# Patient Record
Sex: Male | Born: 1955
Health system: Southern US, Community
[De-identification: ages and names within clinical notes are randomized; demographics above are authoritative.]

## PROBLEM LIST (undated history)

## (undated) DIAGNOSIS — F329 Major depressive disorder, single episode, unspecified: Secondary | ICD-10-CM

## (undated) DIAGNOSIS — E119 Type 2 diabetes mellitus without complications: Secondary | ICD-10-CM

## (undated) DIAGNOSIS — N138 Other obstructive and reflux uropathy: Secondary | ICD-10-CM

## (undated) DIAGNOSIS — N481 Balanitis: Secondary | ICD-10-CM

## (undated) DIAGNOSIS — F32A Depression, unspecified: Secondary | ICD-10-CM

## (undated) DIAGNOSIS — B182 Chronic viral hepatitis C: Secondary | ICD-10-CM

## (undated) DIAGNOSIS — I10 Essential (primary) hypertension: Secondary | ICD-10-CM

## (undated) DIAGNOSIS — Z87898 Personal history of other specified conditions: Secondary | ICD-10-CM

## (undated) DIAGNOSIS — E785 Hyperlipidemia, unspecified: Secondary | ICD-10-CM

## (undated) DIAGNOSIS — N401 Enlarged prostate with lower urinary tract symptoms: Secondary | ICD-10-CM

## (undated) DIAGNOSIS — M1990 Unspecified osteoarthritis, unspecified site: Secondary | ICD-10-CM

## (undated) DIAGNOSIS — F1491 Cocaine use, unspecified, in remission: Secondary | ICD-10-CM

## (undated) DIAGNOSIS — E66812 Obesity, class 2: Secondary | ICD-10-CM

## (undated) DIAGNOSIS — G061 Intraspinal abscess and granuloma: Secondary | ICD-10-CM

## (undated) DIAGNOSIS — T847XXA Infection and inflammatory reaction due to other internal orthopedic prosthetic devices, implants and grafts, initial encounter: Secondary | ICD-10-CM

## (undated) DIAGNOSIS — Z720 Tobacco use: Secondary | ICD-10-CM

## (undated) DIAGNOSIS — R339 Retention of urine, unspecified: Secondary | ICD-10-CM

## (undated) DIAGNOSIS — Z8669 Personal history of other diseases of the nervous system and sense organs: Secondary | ICD-10-CM

## (undated) DIAGNOSIS — J390 Retropharyngeal and parapharyngeal abscess: Secondary | ICD-10-CM

## (undated) DIAGNOSIS — Z8673 Personal history of transient ischemic attack (TIA), and cerebral infarction without residual deficits: Secondary | ICD-10-CM

## (undated) DIAGNOSIS — E669 Obesity, unspecified: Secondary | ICD-10-CM

## (undated) HISTORY — DX: Tobacco use: Z72.0

## (undated) HISTORY — DX: Personal history of transient ischemic attack (TIA), and cerebral infarction without residual deficits: Z86.73

## (undated) HISTORY — DX: Benign prostatic hyperplasia with lower urinary tract symptoms: N40.1

## (undated) HISTORY — DX: Intraspinal abscess and granuloma: G06.1

## (undated) HISTORY — DX: Obesity, class 2: E66.812

## (undated) HISTORY — DX: Chronic viral hepatitis C: B18.2

## (undated) HISTORY — DX: Personal history of other specified conditions: Z87.898

## (undated) HISTORY — DX: Depression, unspecified: F32.A

## (undated) HISTORY — DX: Retropharyngeal and parapharyngeal abscess: J39.0

## (undated) HISTORY — DX: Unspecified osteoarthritis, unspecified site: M19.90

## (undated) HISTORY — DX: Infection and inflammatory reaction due to other internal orthopedic prosthetic devices, implants and grafts, initial encounter: T84.7XXA

## (undated) HISTORY — DX: Retention of urine, unspecified: R33.9

## (undated) HISTORY — DX: Cocaine use, unspecified, in remission: F14.91

## (undated) HISTORY — DX: Obesity, unspecified: E66.9

## (undated) HISTORY — DX: Major depressive disorder, single episode, unspecified: F32.9

## (undated) HISTORY — DX: Benign prostatic hyperplasia with lower urinary tract symptoms: N13.8

## (undated) HISTORY — PX: OTHER SURGICAL HISTORY: SHX169

## (undated) HISTORY — DX: Hyperlipidemia, unspecified: E78.5

## (undated) HISTORY — DX: Personal history of other diseases of the nervous system and sense organs: Z86.69

## (undated) HISTORY — DX: Balanitis: N48.1

---

## 2013-10-25 ENCOUNTER — Emergency Department (INDEPENDENT_AMBULATORY_CARE_PROVIDER_SITE_OTHER)
Admission: EM | Admit: 2013-10-25 | Discharge: 2013-10-25 | Disposition: A | Discharge status: Home / Self Care | Payer: Medicaid - Out of State | Source: Home / Self Care | Attending: Family Medicine | Admitting: Family Medicine

## 2013-10-25 ENCOUNTER — Encounter (HOSPITAL_COMMUNITY): Payer: Self-pay | Admitting: Emergency Medicine

## 2013-10-25 DIAGNOSIS — E119 Type 2 diabetes mellitus without complications: Secondary | ICD-10-CM

## 2013-10-25 DIAGNOSIS — E1165 Type 2 diabetes mellitus with hyperglycemia: Secondary | ICD-10-CM

## 2013-10-25 HISTORY — DX: Essential (primary) hypertension: I10

## 2013-10-25 HISTORY — DX: Type 2 diabetes mellitus without complications: E11.9

## 2013-10-25 LAB — GLUCOSE, CAPILLARY: GLUCOSE-CAPILLARY: 350 mg/dL — AB (ref 70–99)

## 2013-10-25 MED ORDER — INSULIN LISPRO 100 UNIT/ML (KWIKPEN): PEN_INJECTOR | SUBCUTANEOUS | Status: DC

## 2013-10-25 MED ORDER — INSULIN GLARGINE 100 UNIT/ML SOLOSTAR PEN: 50.0000 [IU] | PEN_INJECTOR | Freq: Every day | SUBCUTANEOUS | Status: DC

## 2013-10-25 NOTE — Discharge Instructions (Signed)
Correction Insulin Your health care provider has decided you need to take insulin regularly. You have been given a correction scale (also called a sliding scale) in case you need extra insulin when your blood sugar is too high (hyperglycemia). The following instructions will assist you in how to use that correction scale.  WHAT IS A CORRECTION SCALE?  When you check your blood sugar, sometimes it will be higher than your health care provider has told you it should be. You may need an extra dose of insulin to bring your blood sugar to the recommended level (also known as your goal, target, or normal level). The correction scale is prescribed by your health care provider based on your specific needs.  Your correction scale has two parts:   The first shows you a blood sugar range.   The second part tells you how much extra insulin to give yourself if your blood sugar falls within this range. You will not need an extra dose of insulin if your blood glucose is in the desired range. You should simply give yourself the normal amount of insulin that your health care provider has ordered for you.  WHY IS IT IMPORTANT TO KEEP YOUR BLOOD SUGAR LEVELS AT YOUR DESIRED LEVEL?  Keeping your blood sugar at the desired level helps to prevent long-term complications of diabetes, such as eye disease, kidney failure, nerve damage, and other serious complications. WHAT TYPE OF INSULIN WILL YOU USE?  To help bring down blood sugar levels that are too high, your health care provider will prescribe a short-acting or a rapid-acting insulin. An example of a short-acting insulin would be regular insulin. Remember, you may also have a longer-acting insulin prescribed for you.  WHAT DO YOU NEED TO DO?   Check your blood sugar with your home blood glucose meter as recommended by your health care provider.   Using your correction scale, find the range that your blood sugar lies in.   Look for the units of insulin  that match that blood sugar range. Give yourself the dose of correction insulin your health care provider has prescribed. Always make sure you are using the right type of insulin.   Prior to the injection, make sure you have food available that you can eat in the next 15 30 minutes.   If your correction insulin is rapid acting, start eating your meal within 15 minutes after you have given yourself the insulin injection. If you wait longer than 15 minutes to eat, your blood sugar might get too low.   If your correction insulin is short acting(regular), start eating your meal within 30 minutes after you have given yourself the insulin injection. If you wait longer than 30 minutes to eat, your blood sugar might get too low. Symptoms of low blood sugar (hypoglycemia) may include feeling shaky or weak, sweating, feeling confused, difficulty seeing, agitation, crankiness, or numbness of the lips or tongue. Check your blood sugar immediately and treat your results as directed by your health care provider.   Keep a log of your blood sugar results with the time you took the test and the amount of insulin that you injected. This information will help your health care provider manage your medicines.   Note on your log anything that may affect your blood sugar level, such as:   Changes in normal exercise or activity.   Changes in your normal schedule, such as staying up late, going on vacation, changing your diet, or holidays.  New medicines. This includes prescription and over-the-counter medicines. Some medicines may cause high blood sugar.   Sickness, stress, or anxiety.   Changes in the time you took your medicine.   Changes in your meals, such as skipping a meal, having a late meal, or dining out.   Eating things that may affect blood glucose, such as snacks, meal portions that are larger than normal, drinks with sugar, or eating less than usual.   Ask your health care provider any  questions you have.  Be aware of "stacking" your insulin doses. This happens when you correct a high blood sugar level by giving yourself extra insulin too soon after a previous correction dose or mealtime dose. You may then have too much insulin still active in your body and may be at risk for hypoglycemia. WHY DO YOU NEED A CORRECTION SCALE IF YOU HAVE NEVER BEEN DIAGNOSED WITH DIABETES?   Keeping your blood glucose in the target range is important for your overall health.   You may have been prescribed medicines that cause your blood glucose to be higher than normal. WHEN SHOULD YOU SEEK MEDICAL CARE? Contact your health care provider if:   You have experienced hypoglycemia that you are unable to treat with your usual routine.   You have a high blood sugar level that is not coming down with the correction dose.  Your blood sugar is often too low or does not come up even if you eat a fast-acting carbohydrate. Someone who lives with you should seek immediate medical care if you become unresponsive. Document Released: 02/13/2011 Document Revised: 05/25/2013 Document Reviewed: 03/04/2013 [facility] Patient Information 2014 [employer].  Hyperglycemia Hyperglycemia occurs when the glucose (sugar) in your blood is too high. Hyperglycemia can happen for many reasons, but it most often happens to people who do not know they have diabetes or are not managing their diabetes properly.  CAUSES  Whether you have diabetes or not, there are other causes of hyperglycemia. Hyperglycemia can occur when you have diabetes, but it can also occur in other situations that you might not be as aware of, such as: Diabetes  If you have diabetes and are having problems controlling your blood glucose, hyperglycemia could occur because of some of the following reasons:  Not following your meal plan.  Not taking your diabetes medications or not taking it properly.  Exercising less or doing less activity  than you normally do.  Being sick. Pre-diabetes  This cannot be ignored. Before people develop Type 2 diabetes, they almost always have "pre-diabetes." This is when your blood glucose levels are higher than normal, but not yet high enough to be diagnosed as diabetes. Research has shown that some long-term damage to the body, especially the heart and circulatory system, may already be occurring during pre-diabetes. If you take action to manage your blood glucose when you have pre-diabetes, you may delay or prevent Type 2 diabetes from developing. Stress  If you have diabetes, you may be "diet" controlled or on oral medications or insulin to control your diabetes. However, you may find that your blood glucose is higher than usual in the hospital whether you have diabetes or not. This is often referred to as "stress hyperglycemia." Stress can elevate your blood glucose. This happens because of hormones put out by the body during times of stress. If stress has been the cause of your high blood glucose, it can be followed regularly by your caregiver. That way he/she can make sure your  hyperglycemia does not continue to get worse or progress to diabetes. Steroids  Steroids are medications that act on the infection fighting system (immune system) to block inflammation or infection. One side effect can be a rise in blood glucose. Most people can produce enough extra insulin to allow for this rise, but for those who cannot, steroids make blood glucose levels go even higher. It is not unusual for steroid treatments to "uncover" diabetes that is developing. It is not always possible to determine if the hyperglycemia will go away after the steroids are stopped. A special blood test called an A1c is sometimes done to determine if your blood glucose was elevated before the steroids were started. SYMPTOMS  Thirsty.  Frequent urination.  Dry mouth.  Blurred vision.  Tired or  fatigue.  Weakness.  Sleepy.  Tingling in feet or leg. DIAGNOSIS  Diagnosis is made by monitoring blood glucose in one or all of the following ways:  A1c test. This is a chemical found in your blood.  Fingerstick blood glucose monitoring.  Laboratory results. TREATMENT  First, knowing the cause of the hyperglycemia is important before the hyperglycemia can be treated. Treatment may include, but is not be limited to:  Education.  Change or adjustment in medications.  Change or adjustment in meal plan.  Treatment for an illness, infection, etc.  More frequent blood glucose monitoring.  Change in exercise plan.  Decreasing or stopping steroids.  Lifestyle changes. HOME CARE INSTRUCTIONS   Test your blood glucose as directed.  Exercise regularly. Your caregiver will give you instructions about exercise. Pre-diabetes or diabetes which comes on with stress is helped by exercising.  Eat wholesome, balanced meals. Eat often and at regular, fixed times. Your caregiver or nutritionist will give you a meal plan to guide your sugar intake.  Being at an ideal weight is important. If needed, losing as little as 10 to 15 pounds may help improve blood glucose levels. SEEK MEDICAL CARE IF:   You have questions about medicine, activity, or diet.  You continue to have symptoms (problems such as increased thirst, urination, or weight gain). SEEK IMMEDIATE MEDICAL CARE IF:   You are vomiting or have diarrhea.  Your breath smells fruity.  You are breathing faster or slower.  You are very sleepy or incoherent.  You have numbness, tingling, or pain in your feet or hands.  You have chest pain.  Your symptoms get worse even though you have been following your caregiver's orders.  If you have any other questions or concerns. Document Released: 03/18/2001 Document Revised: 12/15/2011 Document Reviewed: 01/19/2012 [facility] Patient Information 2014 [place].

## 2013-10-25 NOTE — ED Notes (Signed)
Pt reports being out of DM medication x 2 wks.  [place] moved [place] from [place]  In process of getting doctors.   States Blood sugar last checked at lunch time and was at 480.

## 2013-10-25 NOTE — ED Provider Notes (Cosign Needed)
CSN: [id number]     Arrival date & time 10/25/13  1539 History   First MD Initiated Contact with Patient 10/25/13 1801     Chief Complaint  Patient presents with  . Medication Refill   (Consider location/radiation/quality/duration/timing/severity/associated sxs/prior Treatment) HPI Comments: 58 year old male with a history of type 2 diabetes mellitus [place]. Since his mood is benign it is insulin 4 2-1/2-3 weeks. He knows his blood sugars are elevated as noted above. His review of systems essentially negative with the exception of urinary frequency. He has a history of hypertension.   Past Medical History  Diagnosis Date  . Hypertension   . Diabetes mellitus without complication    History reviewed. No pertinent past surgical history. No family history on file. History  Substance Use Topics  . Smoking status: Current Every Day Smoker  . Smokeless tobacco: Not on file  . Alcohol Use: No    Review of Systems  Constitutional: Negative.   Respiratory: Negative.   Cardiovascular: Negative.   Gastrointestinal: Negative.   Genitourinary: Positive for frequency.  Musculoskeletal: Negative for arthralgias, gait problem and neck pain.  Skin: Negative.   Psychiatric/Behavioral: Negative.     Allergies  Review of patient's allergies indicates no known allergies.  Home Medications   Current Outpatient Rx  Name  Route  Sig  Dispense  Refill  . amLODipine (NORVASC) 5 MG tablet   Oral   Take 5 mg by mouth daily.         . insulin aspart (NOVOLOG) 100 UNIT/ML injection   Subcutaneous   Inject into the skin 3 (three) times daily before meals.         . insulin glargine (LANTUS) 100 UNIT/ML injection   Subcutaneous   Inject into the skin at bedtime.         . irbesartan (AVAPRO) 75 MG tablet   Oral   Take 75 mg by mouth daily.         . metFORMIN (GLUCOPHAGE) 500 MG tablet   Oral   Take by mouth 2 (two) times daily with a meal.          . Insulin Glargine (LANTUS SOLOSTAR) 100 UNIT/ML Solostar Pen   Subcutaneous   Inject 50 Units into the skin at bedtime.   15 mL   1   . insulin lispro (HUMALOG) 100 UNIT/ML KiwkPen      Inject 5 units into skin 5 minutes before meals TID   15 mL   11    BP 129/89  Pulse 96  Temp(Src) 98.2 F (36.8 C) (Oral)  Resp 16  SpO2 99% Physical Exam  Nursing note and vitals reviewed. Constitutional: He is oriented to person, place, and time. He appears well-developed and well-nourished. No distress.  HENT:  Mouth/Throat: Oropharynx is clear and moist.  Eyes: Conjunctivae and EOM are normal.  Neck: Normal range of motion. Neck supple.  Cardiovascular: Normal rate, regular rhythm and normal heart sounds.   Pulmonary/Chest: Effort normal and breath sounds normal. No respiratory distress. He has no wheezes.  Abdominal: Soft. There is no tenderness.  Musculoskeletal: He exhibits no edema and no tenderness.  Neurological: He is alert and oriented to person, place, and time. He exhibits normal muscle tone.  Skin: Skin is warm and dry. No rash noted.  Psychiatric: He has a normal mood and affect.    ED Course  Procedures (including critical care time) Labs Review Labs Reviewed  GLUCOSE, CAPILLARY - Abnormal; Notable  for the following:    Glucose-Capillary 350 (*)    All other components within normal limits   Imaging Review No results found.    MDM   1. Type 2 diabetes mellitus with hyperglycemia     Refill Humalog Kwik Pen 100 units per ml 5 units a.c. 3 times a day  Refill Lantus Solostar 50 units subcutaneous at bedtime  Recommend adding 10 units to the regular Lantus insulin dose tonight and adding an extra 5 units of hematologic prior to your evening meal tonight only.    Hayden Rasmussen, NP 10/25/13 [phone]

## 2013-12-02 ENCOUNTER — Ambulatory Visit: Payer: Medicaid Other | Attending: Internal Medicine | Admitting: Internal Medicine

## 2013-12-02 ENCOUNTER — Encounter: Payer: Self-pay | Admitting: Internal Medicine

## 2013-12-02 VITALS — BP 148/115 | HR 76 | Temp 98.8°F | Resp 16 | Wt 221.0 lb

## 2013-12-02 DIAGNOSIS — I10 Essential (primary) hypertension: Secondary | ICD-10-CM | POA: Insufficient documentation

## 2013-12-02 DIAGNOSIS — E1151 Type 2 diabetes mellitus with diabetic peripheral angiopathy without gangrene: Secondary | ICD-10-CM | POA: Insufficient documentation

## 2013-12-02 DIAGNOSIS — R21 Rash and other nonspecific skin eruption: Secondary | ICD-10-CM | POA: Insufficient documentation

## 2013-12-02 DIAGNOSIS — M25562 Pain in left knee: Secondary | ICD-10-CM

## 2013-12-02 DIAGNOSIS — Z794 Long term (current) use of insulin: Secondary | ICD-10-CM | POA: Insufficient documentation

## 2013-12-02 DIAGNOSIS — E1165 Type 2 diabetes mellitus with hyperglycemia: Secondary | ICD-10-CM

## 2013-12-02 DIAGNOSIS — F17200 Nicotine dependence, unspecified, uncomplicated: Secondary | ICD-10-CM | POA: Insufficient documentation

## 2013-12-02 DIAGNOSIS — M25569 Pain in unspecified knee: Secondary | ICD-10-CM | POA: Insufficient documentation

## 2013-12-02 DIAGNOSIS — IMO0002 Reserved for concepts with insufficient information to code with codable children: Secondary | ICD-10-CM | POA: Insufficient documentation

## 2013-12-02 DIAGNOSIS — E119 Type 2 diabetes mellitus without complications: Secondary | ICD-10-CM | POA: Insufficient documentation

## 2013-12-02 DIAGNOSIS — Z139 Encounter for screening, unspecified: Secondary | ICD-10-CM

## 2013-12-02 LAB — COMPLETE METABOLIC PANEL WITH GFR
ALBUMIN: 4.9 g/dL (ref 3.5–5.2)
ALK PHOS: 66 U/L (ref 39–117)
ALT: 89 U/L — ABNORMAL HIGH (ref 0–53)
AST: 48 U/L — AB (ref 0–37)
BUN: 12 mg/dL (ref 6–23)
CALCIUM: 9.8 mg/dL (ref 8.4–10.5)
CHLORIDE: 101 meq/L (ref 96–112)
CO2: 27 mEq/L (ref 19–32)
Creat: 0.95 mg/dL (ref 0.50–1.35)
GFR, Est African American: >89 mL/min
GFR, Est Non African American: 88 mL/min
GLUCOSE: 139 mg/dL — AB (ref 70–99)
POTASSIUM: 4 meq/L (ref 3.5–5.3)
SODIUM: 136 meq/L (ref 135–145)
TOTAL PROTEIN: 8.2 g/dL (ref 6.0–8.3)
Total Bilirubin: 0.6 mg/dL (ref 0.2–1.2)

## 2013-12-02 LAB — LIPID PANEL
CHOL/HDL RATIO: 6 ratio
Cholesterol: 211 mg/dL — ABNORMAL HIGH (ref 0–200)
HDL: 35 mg/dL — ABNORMAL LOW (ref >39)
LDL Cholesterol: 156 mg/dL — ABNORMAL HIGH (ref 0–99)
Triglycerides: 98 mg/dL (ref <150)
VLDL: 20 mg/dL (ref 0–40)

## 2013-12-02 LAB — CBC WITH DIFFERENTIAL/PLATELET
BASOS PCT: 0 % (ref 0–1)
Basophils Absolute: 0 10*3/uL (ref 0.0–0.1)
Eosinophils Absolute: 0 10*3/uL (ref 0.0–0.7)
Eosinophils Relative: 0 % (ref 0–5)
HEMATOCRIT: 45.7 % (ref 39.0–52.0)
Hemoglobin: 15.9 g/dL (ref 13.0–17.0)
Lymphocytes Relative: 51 % — ABNORMAL HIGH (ref 12–46)
Lymphs Abs: 2.6 10*3/uL (ref 0.7–4.0)
MCH: 30.4 pg (ref 26.0–34.0)
MCHC: 34.8 g/dL (ref 30.0–36.0)
MCV: 87.4 fL (ref 78.0–100.0)
Monocytes Absolute: 0.4 10*3/uL (ref 0.1–1.0)
Monocytes Relative: 7 % (ref 3–12)
NEUTROS ABS: 2.1 10*3/uL (ref 1.7–7.7)
NEUTROS PCT: 42 % — AB (ref 43–77)
Platelets: 214 10*3/uL (ref 150–400)
RBC: 5.23 MIL/uL (ref 4.22–5.81)
RDW: 15.4 % (ref 11.5–15.5)
WBC: 5 10*3/uL (ref 4.0–10.5)

## 2013-12-02 LAB — POCT GLYCOSYLATED HEMOGLOBIN (HGB A1C): HEMOGLOBIN A1C: 8.4

## 2013-12-02 LAB — TSH: TSH: 2.003 u[IU]/mL (ref 0.350–4.500)

## 2013-12-02 MED ORDER — HYDROCORTISONE 2.5 % EX CREA: TOPICAL_CREAM | Freq: Two times a day (BID) | CUTANEOUS | Status: DC

## 2013-12-02 MED ORDER — AMLODIPINE BESYLATE 10 MG PO TABS: 10.0000 mg | ORAL_TABLET | Freq: Every day | ORAL | Status: DC

## 2013-12-02 MED ORDER — GLUCOSE BLOOD VI STRP: ORAL_STRIP | Status: DC

## 2013-12-02 MED ORDER — FREESTYLE LANCETS MISC: Status: DC

## 2013-12-02 MED ORDER — INSULIN NPH (HUMAN) (ISOPHANE) 100 UNIT/ML ~~LOC~~ SUSP: 25.0000 [IU] | Freq: Two times a day (BID) | SUBCUTANEOUS | Status: DC

## 2013-12-02 NOTE — Progress Notes (Signed)
Patient here to establish care History of DM Blood sugar this am 110

## 2013-12-02 NOTE — Patient Instructions (Signed)

## 2013-12-02 NOTE — Progress Notes (Signed)
Patient Demographics  William [name], is a 58 y.o. male  [id number]  [id number]  DOB - 09-05-1956  CC:  Chief Complaint  Patient presents with  . Establish Care       HPI: William [name] is a 58 y.o. male here today to establish medical care. Patient has history of diabetes hypertension, as per patient in the past he was taking Lantus and NovoLog but since January he has been on Humulin 25 units twice a day patient denies any hypoglycemic symptoms, his fasting sugar is usually around her 100 mg/dL, for hypertension he has been taking irbesartan as well as Norvasc 5 mg today's blood pressure is elevated, patient denies any headache dizziness. Patient reported to have rash in his groin area. Patient also complained of left knee pain for several months? He was told that he has severe osteoarthritis and needs a knee replacement, has not seen orthopedics yet. Patient has No headache, No chest pain, No abdominal pain - No Nausea, No new weakness tingling or numbness, No Cough - SOB.  No Known Allergies Past Medical History  Diagnosis Date  . Hypertension   . Diabetes mellitus without complication    Current Outpatient Prescriptions on File Prior to Visit  Medication Sig Dispense Refill  . insulin aspart (NOVOLOG) 100 UNIT/ML injection Inject into the skin 3 (three) times daily before meals.      . Insulin Glargine (LANTUS SOLOSTAR) 100 UNIT/ML Solostar Pen Inject 50 Units into the skin at bedtime.  15 mL  1  . insulin glargine (LANTUS) 100 UNIT/ML injection Inject into the skin at bedtime.      . insulin lispro (HUMALOG) 100 UNIT/ML KiwkPen Inject 5 units into skin 5 minutes before meals TID  15 mL  11  . irbesartan (AVAPRO) 75 MG tablet Take 75 mg by mouth daily.      . metFORMIN (GLUCOPHAGE) 500 MG tablet Take by mouth 2 (two) times daily with a meal.       No current facility-administered medications on file prior to visit.   Family History  Problem Relation Age of Onset    . Hypertension Mother   . Diabetes Mother   . Heart disease Mother   . Hypertension Father   . Diabetes Father   . Heart disease Father    History   Social History  . Marital Status: Married    Spouse Name: N/A    Number of Children: N/A  . Years of Education: N/A   Occupational History  . Not on file.   Social History Main Topics  . Smoking status: Current Every Day Smoker  . Smokeless tobacco: Not on file  . Alcohol Use: No  . Drug Use: No  . Sexual Activity: Yes    Birth Control/ Protection: Condom   Other Topics Concern  . Not on file   Social History Narrative  . No narrative on file    Review of Systems: Constitutional: Negative for fever, chills, diaphoresis, activity change, appetite change and fatigue. HENT: Negative for ear pain, nosebleeds, congestion, facial swelling, rhinorrhea, neck pain, neck stiffness and ear discharge.  Eyes: Negative for pain, discharge, redness, itching and visual disturbance. Respiratory: Negative for cough, choking, chest tightness, shortness of breath, wheezing and stridor.  Cardiovascular: Negative for chest pain, palpitations and leg swelling. Gastrointestinal: Negative for abdominal distention. Genitourinary: Negative for dysuria, urgency, frequency, hematuria, flank pain, decreased urine volume, difficulty urinating and dyspareunia.  Musculoskeletal: Negative for back pain, joint swelling, +arthralgia  and gait problem. Neurological: Negative for dizziness, tremors, seizures, syncope, facial asymmetry, speech difficulty, weakness, light-headedness, numbness and headaches.  Hematological: Negative for adenopathy. Does not bruise/bleed easily. Psychiatric/Behavioral: Negative for hallucinations, behavioral problems, confusion, dysphoric mood, decreased concentration and agitation.    Objective:   Filed Vitals:   12/02/13 1426  BP: 148/115  Pulse: 76  Temp: 98.8 F (37.1 C)  Resp: 16    Physical Exam: Constitutional:  Patient appears well-developed and well-nourished. No distress. HENT: Normocephalic, atraumatic, External right and left ear normal. Oropharynx is clear and moist.  Eyes: Conjunctivae and EOM are normal. PERRLA, no scleral icterus. Neck: Normal ROM. Neck supple. No JVD. No tracheal deviation. No thyromegaly. CVS: RRR, S1/S2 +, no murmurs, no gallops, no carotid bruit.  Pulmonary: Effort and breath sounds normal, no stridor, rhonchi, wheezes, rales.  Abdominal: Soft. BS +, no distension, tenderness, rebound or guarding.  Musculoskeletal: Normal range of motion. Some tenderness in the left knee crepitation positive.  Neuro: Alert. Normal reflexes, muscle tone coordination. No cranial nerve deficit. Skin: Skin is warm and dry. No rash noted. Not diaphoretic. No erythema. No pallor. Psychiatric: Normal mood and affect. Behavior, judgment, thought content normal.  No results found for this basename: WBC, HGB, HCT, MCV, PLT   No results found for this basename: CREATININE, BUN, NA, K, CL, CO2    Lab Results  Component Value Date   HGBA1C 8.4 12/02/2013   Lipid Panel  No results found for this basename: chol, trig, hdl, cholhdl, vldl, ldlcalc       Assessment and plan:   1. DM (diabetes mellitus)  - HgB A1c - insulin NPH Human (HUMULIN N) 100 UNIT/ML injection; Inject 25 Units into the skin 2 (two) times daily before a meal.  Dispense: 10 mL; Refill: 11 - glucose blood test strip; Use as instructed  Dispense: 100 each; Refill: 12 Advised patient for diabetes meal planning   2. Essential hypertension, benign Advised patient follow salt diet I have increased the dose of Norvasc to 10 mg daily continue with irbesartan. - amLODipine (NORVASC) 10 MG tablet; Take 1 tablet (10 mg total) by mouth daily.  Dispense: 30 tablet; Refill: 4  3. Smoking Patient is going to try to quit smoking.   4. Left knee pain  - Ambulatory referral to Orthopedic Surgery  5. Rash and nonspecific skin  eruption  - hydrocortisone 2.5 % cream; Apply topically 2 (two) times daily.  Dispense: 30 g; Refill: 0  6. Screening We'll do baseline blood work  - CBC with Differential - COMPLETE METABOLIC PANEL WITH GFR - TSH - Lipid panel - Vit D  25 hydroxy (rtn osteoporosis monitoring)         Return in about 6 weeks (around 01/13/2014) for diabetes, hypertension.    Doris CheadleADVANI, Lakishia Bourassa, MD

## 2013-12-03 LAB — VITAMIN D 25 HYDROXY (VIT D DEFICIENCY, FRACTURES): VIT D 25 HYDROXY: 17 ng/mL — AB (ref 30–89)

## 2013-12-05 ENCOUNTER — Telehealth: Payer: Self-pay

## 2013-12-05 NOTE — Telephone Encounter (Signed)
Phone number listed is not a working number.

## 2014-01-14 ENCOUNTER — Emergency Department: Payer: Self-pay | Admitting: Emergency Medicine

## 2014-01-14 LAB — CBC
HCT: 49.2 % (ref 40.0–52.0)
HGB: 15.7 g/dL (ref 13.0–18.0)
MCH: 29.1 pg (ref 26.0–34.0)
MCHC: 32 g/dL (ref 32.0–36.0)
MCV: 91 fL (ref 80–100)
PLATELETS: 154 10*3/uL (ref 150–440)
RBC: 5.41 10*6/uL (ref 4.40–5.90)
RDW: 14.1 % (ref 11.5–14.5)
WBC: 5.8 10*3/uL (ref 3.8–10.6)

## 2014-01-14 LAB — URINALYSIS, COMPLETE
BACTERIA: NONE SEEN
BILIRUBIN, UR: NEGATIVE
Blood: NEGATIVE
Glucose,UR: >=500 mg/dL (ref 0–75)
KETONE: NEGATIVE
LEUKOCYTE ESTERASE: NEGATIVE
Nitrite: NEGATIVE
PH: 5 (ref 4.5–8.0)
Protein: NEGATIVE
RBC,UR: <1 /HPF (ref 0–5)
SQUAMOUS EPITHELIAL: NONE SEEN
Specific Gravity: 1.03 (ref 1.003–1.030)

## 2014-01-14 LAB — COMPREHENSIVE METABOLIC PANEL
Albumin: 4.1 g/dL (ref 3.4–5.0)
Alkaline Phosphatase: 137 U/L — ABNORMAL HIGH
Anion Gap: 9 (ref 7–16)
BILIRUBIN TOTAL: 0.4 mg/dL (ref 0.2–1.0)
BUN: 10 mg/dL (ref 7–18)
CHLORIDE: 96 mmol/L — AB (ref 98–107)
CREATININE: 1.07 mg/dL (ref 0.60–1.30)
Calcium, Total: 9.5 mg/dL (ref 8.5–10.1)
Co2: 24 mmol/L (ref 21–32)
EGFR (Non-African Amer.): >60
Glucose: 469 mg/dL — ABNORMAL HIGH (ref 65–99)
Osmolality: 279 (ref 275–301)
POTASSIUM: 4.1 mmol/L (ref 3.5–5.1)
SGOT(AST): 168 U/L — ABNORMAL HIGH (ref 15–37)
SGPT (ALT): 437 U/L — ABNORMAL HIGH (ref 12–78)
Sodium: 129 mmol/L — ABNORMAL LOW (ref 136–145)
TOTAL PROTEIN: 8.7 g/dL — AB (ref 6.4–8.2)

## 2014-02-15 ENCOUNTER — Telehealth: Payer: Self-pay | Admitting: Internal Medicine

## 2014-02-15 NOTE — Telephone Encounter (Signed)
I spoke to [name]. [name] is changing providers and she wanted know some of the lab results. I informed her of the doctors notes and some results. I had her send over a release papers so she could have his reports.

## 2014-02-15 NOTE — Telephone Encounter (Signed)
Durward MallardCamille is calling today to request the lab results for Mr. Granberg done on 2/27; [facility] [phone]

## 2014-02-28 ENCOUNTER — Emergency Department: Payer: Self-pay | Admitting: Emergency Medicine

## 2014-02-28 LAB — CBC
HCT: 48.3 % (ref 40.0–52.0)
HGB: 16.2 g/dL (ref 13.0–18.0)
MCH: 30.5 pg (ref 26.0–34.0)
MCHC: 33.6 g/dL (ref 32.0–36.0)
MCV: 91 fL (ref 80–100)
Platelet: 199 10*3/uL (ref 150–440)
RBC: 5.32 10*6/uL (ref 4.40–5.90)
RDW: 14.5 % (ref 11.5–14.5)
WBC: 5.7 10*3/uL (ref 3.8–10.6)

## 2014-02-28 LAB — COMPREHENSIVE METABOLIC PANEL
ALK PHOS: 128 U/L — AB
ANION GAP: 7 (ref 7–16)
AST: 152 U/L — AB (ref 15–37)
Albumin: 4.2 g/dL (ref 3.4–5.0)
BUN: 19 mg/dL — ABNORMAL HIGH (ref 7–18)
Bilirubin,Total: 0.5 mg/dL (ref 0.2–1.0)
CHLORIDE: 98 mmol/L (ref 98–107)
CO2: 26 mmol/L (ref 21–32)
Calcium, Total: 9.4 mg/dL (ref 8.5–10.1)
Creatinine: 1.19 mg/dL (ref 0.60–1.30)
EGFR (Non-African Amer.): >60
Glucose: 424 mg/dL — ABNORMAL HIGH (ref 65–99)
Osmolality: 283 (ref 275–301)
Potassium: 3.9 mmol/L (ref 3.5–5.1)
SGPT (ALT): 318 U/L — ABNORMAL HIGH (ref 12–78)
SODIUM: 131 mmol/L — AB (ref 136–145)
Total Protein: 9.2 g/dL — ABNORMAL HIGH (ref 6.4–8.2)

## 2014-02-28 LAB — URINALYSIS, COMPLETE
Bacteria: NONE SEEN
Bilirubin,UR: NEGATIVE
Blood: NEGATIVE
Ketone: NEGATIVE
Leukocyte Esterase: NEGATIVE
Nitrite: NEGATIVE
PROTEIN: NEGATIVE
Ph: 5 (ref 4.5–8.0)
RBC,UR: NONE SEEN /HPF (ref 0–5)
Specific Gravity: 1.031 (ref 1.003–1.030)
Squamous Epithelial: NONE SEEN
WBC UR: <1 /HPF (ref 0–5)

## 2014-03-08 LAB — LIPID PANEL
Cholesterol: 211 mg/dL — AB (ref 0–200)
HDL: 25 mg/dL — AB (ref 35–70)
LDL Cholesterol: 159 mg/dL
Triglycerides: 134 mg/dL (ref 40–160)

## 2014-03-09 LAB — BASIC METABOLIC PANEL
BUN: 11 mg/dL (ref 4–21)
Creatinine: 1 mg/dL (ref 0.6–1.3)
POTASSIUM: 4.3 mmol/L (ref 3.4–5.3)
SODIUM: 131 mmol/L — AB (ref 137–147)

## 2014-03-09 LAB — HEPATIC FUNCTION PANEL
Alkaline Phosphatase: 88 U/L (ref 25–125)
Bilirubin, Total: 0.8 mg/dL

## 2014-03-09 LAB — HEMOGLOBIN A1C: HEMOGLOBIN A1C: 12.7 % — AB (ref 4.0–6.0)

## 2014-04-05 LAB — HM DIABETES EYE EXAM

## 2014-07-10 LAB — HEMOGLOBIN A1C: Hgb A1c MFr Bld: 14 % — AB (ref 4.0–6.0)

## 2014-07-11 ENCOUNTER — Ambulatory Visit (INDEPENDENT_AMBULATORY_CARE_PROVIDER_SITE_OTHER): Payer: Medicaid Other | Admitting: Endocrinology

## 2014-07-11 ENCOUNTER — Encounter: Payer: Self-pay | Admitting: Endocrinology

## 2014-07-11 VITALS — BP 126/90 | HR 86 | Temp 98.3°F | Resp 14 | Ht 69.0 in | Wt 214.0 lb

## 2014-07-11 DIAGNOSIS — I10 Essential (primary) hypertension: Secondary | ICD-10-CM

## 2014-07-11 DIAGNOSIS — E785 Hyperlipidemia, unspecified: Secondary | ICD-10-CM | POA: Insufficient documentation

## 2014-07-11 DIAGNOSIS — B182 Chronic viral hepatitis C: Secondary | ICD-10-CM | POA: Insufficient documentation

## 2014-07-11 DIAGNOSIS — E119 Type 2 diabetes mellitus without complications: Secondary | ICD-10-CM

## 2014-07-11 LAB — HM DIABETES FOOT EXAM: HM DIABETIC FOOT EXAM: NORMAL

## 2014-07-11 NOTE — Assessment & Plan Note (Signed)
Recent A1c not controlled. Recent sugars on meter are quite elevated.  Obtain recent labs done with PCP.   Discussed sugar monitoring. Check sugars 4 x daily (pre meals and at bedtime). Discussed dietary adjustments and exercise. He will start walking for exercise about 3 times weekly.  Discussed foot care and eye exams. Discussed hypoglycemia. Discussed insulin therapy and proper use of insulin at length. Will switch to pen form next visit.  Continue current metformin.  Adjust lantus to 42 units daily at bedtime.  Adjust Novolog to 15+1 scale as detailed in patient instructions.   RTC 2 weeks.

## 2014-07-11 NOTE — Patient Instructions (Addendum)
Start walking for exercise about 20- 25 minutes three times week.   Check sugars 4 x daily ( before each meal and at bedtime).  Record them in a log book and bring that/meter to next appointment.   For now, would like to see sugars improve to the 200 range.   Continue metformin at current dose.   increase lantus to 42 units daily at bedtime  Increase Novolog as chart below-                Blood Glucose (mg/dL)  Breakfast  (Units Novolog Insulin)  Lunch  (Units Novolog Insulin)  Supper  (Units Novolog Insulin)  Nighttime (Units Novolog Insulin)   <70 Treat the low blood sugar.  Recheck blood glucose  in 15 mins. If sugar is more than 70, then take the number of units of insulin in the 70-90 row, if before a meal.   70-90   9   9   9   0  91-130 (Base Dose)  15  15  15   0   131-150  16  16  16   0   151-200  17  17  17   0   201-250  18  18  18  0   251-300  19  19  19 1    301-350  20  20  20 2    351-400  21  21  21  3    401-450  22  22  22 4    >450  23  23  23  5    Lantus insulin 42 units subcutaneous Nightly                       Please come back for a follow-up appointment in 2 weeks

## 2014-07-11 NOTE — Progress Notes (Signed)
Pre-visit discussion using our clinic review tool. No additional management support is needed unless otherwise documented below in the visit note.  

## 2014-07-11 NOTE — Assessment & Plan Note (Signed)
BP fairly well controlled. Request old record for urine MA result. Not sure whether ARB was discontinued or patient stopped it on his own. Will address at next few visits, once sugars are better.

## 2014-07-11 NOTE — Assessment & Plan Note (Signed)
Request old record on his recent lipid levels. Discussed diet and exercise. Weight reduction and low fat diet. Assess need for statin at next few visits.

## 2014-07-11 NOTE — Progress Notes (Signed)
Reason for visit-  William [name] is a 58 y.o.-year-old male, referred by his PCP, Dr Edwena FeltyAshany Sundaram , for management of Type 2 diabetes, uncontrolled, without complications.   HPI- Patient has been diagnosed with diabetes in 2013 per his report. Recalls being initially on lifestyle modifications.  Tried  Metformin, Glipizide in the past. he has been on insulin since the past 2-3 years.  Was taking NPH as well prior to this.    Pt is currently on a regimen of: - Metformin 1000 mg po bid - Lantus 40 units bedtime - Novolog ~4+2 units qac TID on a sliding scale ( generally needing 12-15 units qac) Uses syringes. Moving injection sites. Insurance not paying the pen?- but would like to switch over- recently recd a Novolog refill.    Last hemoglobin A1c was: 14% per his report- checked by PCP yday. Don't have these results for review.  Lab Results  Component Value Date   HGBA1C 8.4 12/02/2013   Had  Recurrent balanitis recently. Notes higher sugars when he has this. Takes Fluconazole for this.    Pt checks his sugars 2-3 a day . Uses AccuChek Aviva glucometer. By meter review they are:  PREMEAL Breakfast Lunch Dinner Bedtime Overall  Glucose range: 178-335 101-502 171-hi 249-373   Mean/median:         Hypoglycemia-  No lows. Lowest sugar was in the 50s in the night about 3 months ago; he has hypoglycemia awareness.   Dietary habits- eats three times daily. Tries to limit carbs, sweetened beverages, sodas, desserts. Used to drink "lots of Kool Aid" till about 3 weeks ago, now mainly lemon water Exercise- walks to his mailbox occasionally. No exercise otherwise. Is on disability due to joint aches, prior joint surgeries, DM, HTN per his report. Weight - downtrending Wt Readings from Last 3 Encounters:  07/11/14 214 lb (97.07 kg)  12/02/13 221 lb (100.245 kg)    Diabetes Complications-  Nephropathy- No  CKD, last BUN/creatinine- GFR >89 Lab Results  Component Value Date   BUN 12  12/02/2013   CREATININE 0.95 12/02/2013   No results found for this basename: MICROALBUR,  [id number]   Retinopathy- No, Last DEE was in August 2015 Neuropathy- reports occasional numbness and tingling in his soles. No known neuropathy.  Associated history - No CAD . No prior stroke. No hypothyroidism. his last TSH was  Lab Results  Component Value Date   TSH 2.003 12/02/2013    Hyperlipidemia-  his last set of lipids were uncontrolled- Currently on dietary therapy.  Lab Results  Component Value Date   CHOL 211* 12/02/2013   HDL 35* 12/02/2013   LDLCALC 156* 12/02/2013   TRIG 98 12/02/2013   CHOLHDL 6.0 12/02/2013    Blood Pressure/HTN- Patient's blood pressure is well controlled today on current regimen that includes CCB. Reports having cough with lisinopril and no longer taking Irbesartan for unclear reasons that is on his printed med list.  Pt has FH of DM in  Parents.  I have reviewed the patient's past medical history, family and social history, surgical history, medications and allergies.  Past Medical History  Diagnosis Date  . Hypertension   . Diabetes mellitus without complication    Past Surgical History  Procedure Laterality Date  . Rotator  cuff surgery       left shoulder    History   Social History  . Marital Status: Married    Spouse Name: N/A    Number of Children: N/A  .  Years of Education: N/A   Occupational History  . Not on file.   Social History Main Topics  . Smoking status: Current Every Day Smoker  . Smokeless tobacco: Not on file  . Alcohol Use: No  . Drug Use: No  . Sexual Activity: Yes    Birth Control/ Protection: Condom   Other Topics Concern  . Not on file   Social History Narrative  . No narrative on file   Current Outpatient Prescriptions on File Prior to Visit  Medication Sig Dispense Refill  . amLODipine (NORVASC) 10 MG tablet Take 1 tablet (10 mg total) by mouth daily.  30 tablet  4  . glucose blood test strip Use as  instructed  100 each  12  . hydrocortisone 2.5 % cream Apply topically 2 (two) times daily.  30 g  0  . insulin aspart (NOVOLOG) 100 UNIT/ML injection Inject into the skin 3 (three) times daily before meals.      . Insulin Glargine (LANTUS SOLOSTAR) 100 UNIT/ML Solostar Pen Inject 50 Units into the skin at bedtime.  15 mL  1  . Lancets (FREESTYLE) lancets Use as instructed  100 each  12  . metFORMIN (GLUCOPHAGE) 500 MG tablet Take 1,000 mg by mouth 2 (two) times daily with a meal.        No current facility-administered medications on file prior to visit.   No Known Allergies Family History  Problem Relation Age of Onset  . Hypertension Mother   . Diabetes Mother   . Heart disease Mother   . Thyroid disease Mother   . Hypertension Father   . Diabetes Father   . Heart disease Father      Review of Systems: [x]  complains of  [  ] denies General:   [ x ] Recent weight change [  ] Fatigue  [  ] Loss of appetite Eyes: [ x ]  Vision Difficulty [  ]  Eye pain ENT: [  ]  Hearing difficulty [  ]  Difficulty Swallowing CVS: [  ] Chest pain [  ]  Palpitations/Irregular Heart beat [  ]  Shortness of breath lying flat [  ] Swelling of legs Resp: [  ] Frequent Cough [  ] Shortness of Breath  [  ]  Wheezing GI: [  ] Heartburn  [  ] Nausea or Vomiting  [  ] Diarrhea [  ] Constipation  [  ] Abdominal Pain GU: [ x ]  Polyuria  [x  ]  nocturia Bones/joints:  [ x ]  Muscle aches  [ x ] Joint Pain  [  ] Bone pain Skin/Hair/Nails: [  ]  Rash  [  ] New stretch marks [  ]  Itching [  ] Hair loss [  ]  Excessive hair growth Reproduction: [ x ] Low sexual desire , [  ]  Women: Menstrual cycle problems [  ]  Women: Breast Discharge [ x ] Men: Difficulty with erections [  ]  Men: Enlarged Breasts CNS: [ x ] Frequent Headaches [ x ] Blurry vision [  ] Tremors [  ] Seizures [  ] Loss of consciousness [  ] Localized weakness Endocrine: [  ]  Excess thirst [x  ]  Feeling excessively hot [  ]  Feeling excessively  cold Heme: [  ]  Easy bruising [  ]  Enlarged glands or lumps in neck Allergy: [  ]  Food allergies [  ]  Environmental allergies  PE: BP 126/90  Pulse 86  Temp(Src) 98.3 F (36.8 C) (Oral)  Resp 14  Ht 5\' 9"  (1.753 m)  Wt 214 lb (97.07 kg)  BMI 31.59 kg/m2  SpO2 96% Wt Readings from Last 3 Encounters:  07/11/14 214 lb (97.07 kg)  12/02/13 221 lb (100.245 kg)   GENERAL: No acute distress, well developed HEENT:  Eye exam shows normal external appearance. Oral exam shows normal mucosa .  NECK:   Neck exam shows no lymphadenopathy. No Carotids bruits. Thyroid is not enlarged and no nodules felt.  no acanthosis nigricans LUNGS:         Chest is symmetrical. Lungs are clear to auscultation.Marland Kitchen   HEART:         Heart sounds:  S1 and S2 are normal. No murmurs or clicks heard. ABDOMEN:  No Distention present. Liver and spleen are not palpable. No other mass or tenderness present.  EXTREMITIES:     There is no edema. 2+ DP pulses  NEUROLOGICAL:     Grossly intact.            Diabetic foot exam done with shoes and socks removed: Normal Monofilament testing bilaterally. No deformities of toes.  Bunions. Nails  Not trimmed. Skin normal color. No open wounds. Dry skin. Calluses+ MUSCULOSKELETAL:       There is no enlargement or gross deformity of the joints.  SKIN:       No rash  ASSESSMENT AND PLAN: 1. Diabetes mellitus 2. Hyperlipidemia 3. Hypertension  Problem List Items Addressed This Visit     Cardiovascular and Mediastinum   Essential hypertension, benign      BP fairly well controlled. Request old record for urine MA result. Not sure whether ARB was discontinued or patient stopped it on his own. Will address at next few visits, once sugars are better.       Endocrine   Diabetes - Primary     Recent A1c not controlled. Recent sugars on meter are quite elevated.  Obtain recent labs done with PCP.   Discussed sugar monitoring. Check sugars 4 x daily (pre meals and at  bedtime). Discussed dietary adjustments and exercise. He will start walking for exercise about 3 times weekly.  Discussed foot care and eye exams. Discussed hypoglycemia. Discussed insulin therapy and proper use of insulin at length. Will switch to pen form next visit.  Continue current metformin.  Adjust lantus to 42 units daily at bedtime.  Adjust Novolog to 15+1 scale as detailed in patient instructions.   RTC 2 weeks.       Other   Hyperlipidemia     Request old record on his recent lipid levels. Discussed diet and exercise. Weight reduction and low fat diet. Assess need for statin at next few visits.          - Return to clinic in 2 weeks with sugar log/meter.  Harpreet Pompey PUSHKAR 07/11/2014 1:12 PM

## 2014-07-12 ENCOUNTER — Other Ambulatory Visit: Payer: Self-pay | Admitting: Endocrinology

## 2014-07-12 ENCOUNTER — Encounter: Payer: Self-pay | Admitting: *Deleted

## 2014-07-12 DIAGNOSIS — Z8673 Personal history of transient ischemic attack (TIA), and cerebral infarction without residual deficits: Secondary | ICD-10-CM | POA: Insufficient documentation

## 2014-07-12 DIAGNOSIS — F329 Major depressive disorder, single episode, unspecified: Secondary | ICD-10-CM | POA: Insufficient documentation

## 2014-07-12 DIAGNOSIS — G8929 Other chronic pain: Secondary | ICD-10-CM | POA: Insufficient documentation

## 2014-07-12 DIAGNOSIS — F1491 Cocaine use, unspecified, in remission: Secondary | ICD-10-CM

## 2014-07-12 DIAGNOSIS — M1990 Unspecified osteoarthritis, unspecified site: Secondary | ICD-10-CM

## 2014-07-12 DIAGNOSIS — M2550 Pain in unspecified joint: Secondary | ICD-10-CM

## 2014-07-12 DIAGNOSIS — E119 Type 2 diabetes mellitus without complications: Secondary | ICD-10-CM

## 2014-07-12 DIAGNOSIS — E669 Obesity, unspecified: Secondary | ICD-10-CM

## 2014-07-12 DIAGNOSIS — F1410 Cocaine abuse, uncomplicated: Secondary | ICD-10-CM | POA: Insufficient documentation

## 2014-07-12 DIAGNOSIS — F32A Depression, unspecified: Secondary | ICD-10-CM

## 2014-07-12 DIAGNOSIS — N481 Balanitis: Secondary | ICD-10-CM

## 2014-07-12 DIAGNOSIS — Z87898 Personal history of other specified conditions: Secondary | ICD-10-CM | POA: Insufficient documentation

## 2014-07-12 DIAGNOSIS — M25569 Pain in unspecified knee: Secondary | ICD-10-CM | POA: Insufficient documentation

## 2014-07-12 DIAGNOSIS — Z8669 Personal history of other diseases of the nervous system and sense organs: Secondary | ICD-10-CM

## 2014-07-25 ENCOUNTER — Ambulatory Visit (INDEPENDENT_AMBULATORY_CARE_PROVIDER_SITE_OTHER): Payer: Medicaid Other | Admitting: Endocrinology

## 2014-07-25 ENCOUNTER — Encounter: Payer: Self-pay | Admitting: Endocrinology

## 2014-07-25 VITALS — BP 132/84 | HR 90 | Wt 219.5 lb

## 2014-07-25 DIAGNOSIS — E785 Hyperlipidemia, unspecified: Secondary | ICD-10-CM

## 2014-07-25 DIAGNOSIS — E119 Type 2 diabetes mellitus without complications: Secondary | ICD-10-CM

## 2014-07-25 DIAGNOSIS — I10 Essential (primary) hypertension: Secondary | ICD-10-CM

## 2014-07-25 MED ORDER — INSULIN ASPART 100 UNIT/ML FLEXPEN: PEN_INJECTOR | SUBCUTANEOUS | Status: DC

## 2014-07-25 MED ORDER — INSULIN GLARGINE 100 UNIT/ML SOLOSTAR PEN: 42.0000 [IU] | PEN_INJECTOR | Freq: Every day | SUBCUTANEOUS | Status: DC

## 2014-07-25 NOTE — Assessment & Plan Note (Addendum)
BP fairly well controlled. Recent abnormal urine MA 07/2014. Not sure whether ARB was discontinued or patient stopped it on his own. Will address at next few visits, once sugars are better. Will check urine MA at that time.

## 2014-07-25 NOTE — Progress Notes (Signed)
Pre visit review using our clinic review tool, if applicable. No additional management support is needed unless otherwise documented below in the visit note. 

## 2014-07-25 NOTE — Assessment & Plan Note (Signed)
Recently uncontrolled.  Discussed diet and exercise. Weight reduction and low fat diet. Assess need for statin at next few visits, recheck lipids Dec 2015.

## 2014-07-25 NOTE — Assessment & Plan Note (Signed)
Recent A1c not controlled. Recent sugars are improving, but still with some variability. Suspect that this is related to diet, exercise or pain levels.  He will probably benefit from DM education referral. [facility]    Check sugars 4 x daily (pre meals and at bedtime). Discussed hypoglycemia and treatment. Insulins refilled as pens.  Continue current metformin.  continue lantus to 42 units daily at bedtime.  Continue Novolog to 15+1 scale as detailed in patient instructions at last visit.    RTC 6 weeks.

## 2014-07-25 NOTE — Progress Notes (Signed)
Reason for visit-  William [name] is a 58 y.o.-year-old male, here for follow up management of Type 2 diabetes, uncontrolled, without complications. Last visit with me 2 weeks ago.    HPI- Patient has been diagnosed with diabetes in 2013 per his report. Recalls being initially on lifestyle modifications.  Tried  Metformin, Glipizide in the past. he has been on insulin since the past 2-3 years.  Was taking NPH as well prior to this.    Pt is currently on a regimen of: - Metformin 1000 mg po bid - Lantus 42 units bedtime - Novolog 15+1 units qac TID on a sliding scale  Uses syringes. Moving injection sites. Insurance not paying the pen?- but would like to switch over for convenience.   Last hemoglobin A1c was:  Lab Results  Component Value Date   HGBA1C 14.0* 07/10/2014   HGBA1C 12.7* 03/09/2014   HGBA1C 8.4 12/02/2013   Had  Recurrent balanitis recently. Notes higher sugars when he has this. Takes Fluconazole for this. Now having this on and off.    Pt checks his sugars 4 a day . Uses AccuChek Aviva glucometer. By sugar log they are:  PREMEAL Breakfast Lunch Dinner Bedtime Overall  Glucose range: 92-237 134-266 85-388 88-243   Mean/median:         Hypoglycemia-  Some recent lows in the 50-80 range at various times of the day; he has hypoglycemia awareness.   Dietary habits- eats three times daily. Tries to limit carbs, sweetened beverages, sodas, desserts. Used to drink "lots of Kool Aid" till about 6 weeks ago, now mainly lemon water. Rarely skipping meals.  Exercise- walks around the block for 20 minutes now.  Is on disability due to joint aches, prior joint surgeries, DM, HTN per his report. Has knee pain on and off and is on Oxycodone for this.  Weight - now improving with better sugars Wt Readings from Last 3 Encounters:  07/25/14 219 lb 8 oz (99.565 kg)  07/11/14 214 lb (97.07 kg)  12/02/13 221 lb (100.245 kg)    Diabetes Complications-  Nephropathy- No  CKD, last  BUN/creatinine- GFR >89 Lab Results  Component Value Date   BUN 12 12/02/2013   CREATININE 0.95 12/02/2013   No results found for this basename: MICROALBUR,  [id number]   Retinopathy- No, Last DEE was in August 2015 Neuropathy- reports occasional numbness and tingling in his soles. No known neuropathy.  Associated history - No CAD . No prior stroke. No hypothyroidism. his last TSH was  Lab Results  Component Value Date   TSH 2.003 12/02/2013    Hyperlipidemia-  his last set of lipids were uncontrolled- Currently on dietary therapy.  Lab Results  Component Value Date   CHOL 211* 03/09/2014   HDL 25* 03/09/2014   LDLCALC 159 03/09/2014   TRIG 134 03/09/2014   CHOLHDL 6.0 12/02/2013    Blood Pressure/HTN- Patient's blood pressure is well controlled today on current regimen that includes CCB. Reports having cough with lisinopril and no longer taking Irbesartan for unclear reasons that is on his printed med list. Doesn't recall any specific side effects with medication.    I have reviewed the patient's past medical history,medications and allergies.   Current Outpatient Prescriptions on File Prior to Visit  Medication Sig Dispense Refill  . amLODipine (NORVASC) 10 MG tablet Take 1 tablet (10 mg total) by mouth daily.  30 tablet  4  . glucose blood test strip Use as instructed  100 each  12  .  Lancets (FREESTYLE) lancets Use as instructed  100 each  12  . metFORMIN (GLUCOPHAGE) 500 MG tablet Take 1,000 mg by mouth 2 (two) times daily with a meal.       . oxyCODONE-acetaminophen (PERCOCET/ROXICET) 5-325 MG per tablet Take 1 tablet by mouth every 12 (twelve) hours as needed for severe pain.       No current facility-administered medications on file prior to visit.   No Known Allergies   Review of Systems- [ x ]  Complains of    [  ]  denies [ x ] Recent weight change [  ]  Fatigue [  ] polydipsia [  ] polyuria [ x ]  nocturia [  ]  vision difficulty [  ] chest pain [  ] shortness of breath [   ] leg swelling [  ] cough [  ] nausea/vomiting [  ] diarrhea [  ] constipation [  ] abdominal pain [  ]  tingling/numbness in extremities [  ]  concern with feet ( wounds/sores)   PE: BP 132/84  Pulse 90  Wt 219 lb 8 oz (99.565 kg)  SpO2 96% Wt Readings from Last 3 Encounters:  07/25/14 219 lb 8 oz (99.565 kg)  07/11/14 214 lb (97.07 kg)  12/02/13 221 lb (100.245 kg)   Exam: deferred  ASSESSMENT AND PLAN: 1. Diabetes mellitus 2. Hyperlipidemia 3. Hypertension  Problem List Items Addressed This Visit     Cardiovascular and Mediastinum   Essential hypertension, benign      BP fairly well controlled. Recent abnormal urine MA 07/2014. Not sure whether ARB was discontinued or patient stopped it on his own. Will address at next few visits, once sugars are better. Will check urine MA at that time.         Endocrine   Diabetes - Primary     Recent A1c not controlled. Recent sugars are improving, but still with some variability. Suspect that this is related to diet, exercise or pain levels.  He will probably benefit from DM education referral. [facility]    Check sugars 4 x daily (pre meals and at bedtime). Discussed hypoglycemia and treatment. Insulins refilled as pens.  Continue current metformin.  continue lantus to 42 units daily at bedtime.  Continue Novolog to 15+1 scale as detailed in patient instructions at last visit.    RTC 6 weeks.       Relevant Medications      insulin aspart (NOVOLOG FLEXPEN) 100 UNIT/ML FlexPen      Insulin Glargine (LANTUS) 100 UNIT/ML Solostar Pen   Other Relevant Orders      Ambulatory referral to diabetic education     Other   Hyperlipidemia     Recently uncontrolled.  Discussed diet and exercise. Weight reduction and low fat diet. Assess need for statin at next few visits, recheck lipids Dec 2015.       Relevant Orders      Ambulatory referral to diabetic education        - Return to clinic in 6 weeks with sugar  log/meter.  Derra [facility]zer [facility] 07/25/2014 2:14 PM

## 2014-07-25 NOTE — Patient Instructions (Signed)
Continue current metformin, lantus and Novolog.   [facility].   Switch to insulin pens. Controlling your det, exercise, pain might help regulating your sugars better.   Please come back for a follow-up appointment in 6 weeks

## 2014-07-26 ENCOUNTER — Encounter: Payer: Self-pay | Admitting: *Deleted

## 2014-07-26 ENCOUNTER — Other Ambulatory Visit: Payer: Self-pay | Admitting: Endocrinology

## 2014-07-26 ENCOUNTER — Telehealth: Payer: Self-pay | Admitting: *Deleted

## 2014-07-26 NOTE — Telephone Encounter (Signed)
Pt notified and verbalized understanding.

## 2014-07-26 NOTE — Telephone Encounter (Signed)
Message copied by Cecelia Byars on Wed Jul 26, 2014 10:00 AM ------      Message from: Bynum Bellows P      Created: Wed Jul 26, 2014  9:51 AM      Regarding: call pt regd medication please       I reviewed his last liver tests from PCP.       Done 03/09/14            AST 172. ALT 482. ALK 88. Bili total 0.8.             These are significantly elevated as compared to the ones we have here from feb 2015.             Please ask him to not take metformin till further notice from me. I will recheck these at my next visit here and then decide whether he can restart the medication again based on his tests done then.              thanks       ------

## 2014-08-04 ENCOUNTER — Telehealth: Payer: Self-pay

## 2014-08-04 MED ORDER — INSULIN PEN NEEDLE 32G X 6 MM MISC: Status: AC

## 2014-08-04 NOTE — Telephone Encounter (Signed)
The pt called and stated he needs an rx for Pen needles for his insulin pens.   Callback -[phone]

## 2014-08-09 ENCOUNTER — Ambulatory Visit: Payer: Self-pay | Admitting: Family Medicine

## 2014-09-05 ENCOUNTER — Ambulatory Visit: Payer: Medicaid Other | Admitting: Endocrinology

## 2014-09-05 ENCOUNTER — Ambulatory Visit: Payer: Self-pay | Admitting: Family Medicine

## 2014-10-01 ENCOUNTER — Encounter (HOSPITAL_COMMUNITY): Payer: Self-pay | Admitting: Emergency Medicine

## 2014-10-01 ENCOUNTER — Emergency Department (HOSPITAL_COMMUNITY): Payer: Medicaid Other

## 2014-10-01 ENCOUNTER — Emergency Department (HOSPITAL_COMMUNITY)
Admission: EM | Admit: 2014-10-01 | Discharge: 2014-10-01 | Disposition: A | Discharge status: Home / Self Care | Payer: Medicaid Other | Attending: Emergency Medicine | Admitting: Emergency Medicine

## 2014-10-01 DIAGNOSIS — Z794 Long term (current) use of insulin: Secondary | ICD-10-CM | POA: Diagnosis not present

## 2014-10-01 DIAGNOSIS — M25562 Pain in left knee: Secondary | ICD-10-CM | POA: Diagnosis not present

## 2014-10-01 DIAGNOSIS — E669 Obesity, unspecified: Secondary | ICD-10-CM | POA: Diagnosis not present

## 2014-10-01 DIAGNOSIS — Z8673 Personal history of transient ischemic attack (TIA), and cerebral infarction without residual deficits: Secondary | ICD-10-CM | POA: Insufficient documentation

## 2014-10-01 DIAGNOSIS — I10 Essential (primary) hypertension: Secondary | ICD-10-CM | POA: Insufficient documentation

## 2014-10-01 DIAGNOSIS — E119 Type 2 diabetes mellitus without complications: Secondary | ICD-10-CM | POA: Diagnosis not present

## 2014-10-01 DIAGNOSIS — Z8619 Personal history of other infectious and parasitic diseases: Secondary | ICD-10-CM | POA: Insufficient documentation

## 2014-10-01 DIAGNOSIS — Z8739 Personal history of other diseases of the musculoskeletal system and connective tissue: Secondary | ICD-10-CM | POA: Insufficient documentation

## 2014-10-01 DIAGNOSIS — Z79899 Other long term (current) drug therapy: Secondary | ICD-10-CM | POA: Diagnosis not present

## 2014-10-01 DIAGNOSIS — Z8659 Personal history of other mental and behavioral disorders: Secondary | ICD-10-CM | POA: Insufficient documentation

## 2014-10-01 DIAGNOSIS — R52 Pain, unspecified: Secondary | ICD-10-CM

## 2014-10-01 DIAGNOSIS — Z720 Tobacco use: Secondary | ICD-10-CM | POA: Insufficient documentation

## 2014-10-01 DIAGNOSIS — R609 Edema, unspecified: Secondary | ICD-10-CM

## 2014-10-01 LAB — D-DIMER, QUANTITATIVE: D-Dimer, Quant: <0.27 ug/mL-FEU (ref 0.00–0.48)

## 2014-10-01 MED ORDER — NAPROXEN 500 MG PO TABS: 500.0000 mg | ORAL_TABLET | Freq: Two times a day (BID) | ORAL | Status: DC

## 2014-10-01 NOTE — Discharge Instructions (Signed)

## 2014-10-01 NOTE — ED Provider Notes (Signed)
CSN: [id number]     Arrival date & time 10/01/14  0036 History   First MD Initiated Contact with Patient 10/01/14 [phone]     Chief Complaint  Patient presents with  . Knee Pain     Patient is a 58 y.o. male presenting with knee pain. The history is provided by the patient.  Knee Pain  Mr. Magos presents for left knee pain. Last night he developed swelling and pain behind his left knee on his at rest. He has a history of arthritis left knee and chronic pain but he normally does not have pain behind the knee. Pain is worse with range of motion. He denies any fevers, injury, similar previous symptoms, history of gout, vomiting. He has a history of high blood pressure diabetes. He feels a little swollen in that leg. Symptoms are mild to moderate.  Past Medical History  Diagnosis Date  . Hypertension   . Diabetes mellitus without complication   . Chronic hepatitis C without hepatic coma   . History of cocaine use   . Depression   . Arthritis   . History of TIA (transient ischemic attack)   . Hyperlipidemia   . History of migraine headaches   . Obesity, Class II, BMI 35-39.9    Past Surgical History  Procedure Laterality Date  . Rotator  cuff surgery       left shoulder    Family History  Problem Relation Age of Onset  . Hypertension Mother   . Diabetes Mother   . Heart disease Mother   . Thyroid disease Mother   . Hypertension Father   . Diabetes Father   . Heart disease Father    History  Substance Use Topics  . Smoking status: Current Some Day Smoker -- 0.25 packs/day for 30 years    Types: Cigarettes  . Smokeless tobacco: Not on file  . Alcohol Use: Yes     Comment: occasional alcohol use    Review of Systems  All other systems reviewed and are negative.     Allergies  Review of patient's allergies indicates no known allergies.  Home Medications   Prior to Admission medications   Medication Sig Start Date End Date Taking? Authorizing Provider  amLODipine  (NORVASC) 10 MG tablet Take 1 tablet (10 mg total) by mouth daily. 12/02/13   Doris Cheadleeepak Advani, MD  glucose blood test strip Use as instructed 12/02/13   Doris Cheadleeepak Advani, MD  insulin aspart (NOVOLOG FLEXPEN) 100 UNIT/ML FlexPen Use on a sliding scale between 5-15 units with each meal. Max daily dose 50 units per day. 07/25/14   Quentin Cornwalladhika P Phadke, MD  Insulin Glargine (LANTUS) 100 UNIT/ML Solostar Pen Inject 42 Units into the skin at bedtime. 07/25/14   Quentin Cornwalladhika P Phadke, MD  Insulin Pen Needle (EASY TOUCH PEN NEEDLES) 32G X 6 MM MISC Use with Flexpen to administer Insulin 08/04/14   Radhika P Phadke, MD  Lancets (FREESTYLE) lancets Use as instructed 12/02/13   Doris Cheadleeepak Advani, MD  metFORMIN (GLUCOPHAGE) 500 MG tablet Take 1,000 mg by mouth 2 (two) times daily with a meal.     Historical Provider, MD  oxyCODONE-acetaminophen (PERCOCET/ROXICET) 5-325 MG per tablet Take 1 tablet by mouth every 12 (twelve) hours as needed for severe pain.    Historical Provider, MD   BP 126/89 mmHg  Pulse 70  Temp(Src) 98 F (36.7 C) (Oral)  Resp 16  Ht 5\' 10"  (1.778 m)  Wt 214 lb (97.07 kg)  BMI 30.71 kg/m2  SpO2 96% Physical Exam  Constitutional: He is oriented to person, place, and time. He appears well-developed and well-nourished.  HENT:  Head: Normocephalic and atraumatic.  Cardiovascular: Normal rate.   Pulmonary/Chest: Effort normal. No respiratory distress.  Musculoskeletal:  2+ DP pulses bilaterally. Left knee with small effusion and swelling. There is mild tenderness and swelling to the posterior knee. There is no overlying erythema or rash. There is no circumferential swelling of the distal leg.  Neurological: He is alert and oriented to person, place, and time.  Skin: Skin is warm and dry.  Psychiatric: He has a normal mood and affect. His behavior is normal.  Nursing note and vitals reviewed.   ED Course  Procedures (including critical care time) Labs Review Labs Reviewed  D-DIMER, QUANTITATIVE     Imaging Review Dg Knee Complete 4 Views Left  10/01/2014   CLINICAL DATA:  Acute onset of left knee pain and swelling. Initial encounter.  EXAM: LEFT KNEE - COMPLETE 4+ VIEW  COMPARISON:  None.  FINDINGS: There is no evidence of fracture or dislocation. The joint spaces are preserved. Marginal osteophytes are seen arising in all three compartments, with mild cortical irregularity at the medial compartment, and possible loose bodies noted at the superior aspect of the patellofemoral compartment and posterior joint space.  A small knee joint effusion is noted. A fabella is seen. No additional soft tissue abnormalities are characterized on radiograph.  IMPRESSION: 1. No evidence of fracture or dislocation. 2. Mild tricompartmental osteoarthritis noted, with apparent loose bodies noted at the joint space, both superiorly and posteriorly. 3. Small knee joint effusion noted.   Electronically Signed   By: Roanna RaiderJeffery  Chang M.D.   On: 10/01/2014 02:20     EKG Interpretation None      MDM   Final diagnoses:  Knee pain, acute, left    Patient here for evaluation of knee pain. Clinical picture not consistent with acute gout or septic arthritis. Doubt DVT patient is low risk by well's criteria d-dimer is negative. Discussed with patient having care for arthralgia with naproxen and close PCP follow-up. Return process were discussed.    Tilden FossaElizabeth Davyn Morandi, MD 10/01/14 [phone]

## 2014-10-01 NOTE — ED Notes (Signed)
Pt. reports posterior left knee pain/swelling  onset this evening , denies injury or fall , pain increases with movement and palpation .

## 2014-10-01 NOTE — ED Notes (Signed)
Patient presents to ed c/o knee pain States he knows he needs a left knee replacement however tonight was watching TV left knee started hurting and left swollen states he noticed swelling behind his knee. States he has an appointment with ortho in Jan. Denies recent injury

## 2014-10-27 ENCOUNTER — Emergency Department (HOSPITAL_COMMUNITY)
Admission: EM | Admit: 2014-10-27 | Discharge: 2014-10-27 | Disposition: A | Discharge status: Home / Self Care | Payer: Medicaid Other | Attending: Emergency Medicine | Admitting: Emergency Medicine

## 2014-10-27 ENCOUNTER — Encounter (HOSPITAL_COMMUNITY): Payer: Self-pay

## 2014-10-27 DIAGNOSIS — Z87891 Personal history of nicotine dependence: Secondary | ICD-10-CM | POA: Diagnosis not present

## 2014-10-27 DIAGNOSIS — I10 Essential (primary) hypertension: Secondary | ICD-10-CM | POA: Diagnosis not present

## 2014-10-27 DIAGNOSIS — E669 Obesity, unspecified: Secondary | ICD-10-CM | POA: Diagnosis not present

## 2014-10-27 DIAGNOSIS — Z791 Long term (current) use of non-steroidal anti-inflammatories (NSAID): Secondary | ICD-10-CM | POA: Diagnosis not present

## 2014-10-27 DIAGNOSIS — G44219 Episodic tension-type headache, not intractable: Secondary | ICD-10-CM | POA: Diagnosis not present

## 2014-10-27 DIAGNOSIS — Z794 Long term (current) use of insulin: Secondary | ICD-10-CM | POA: Diagnosis not present

## 2014-10-27 DIAGNOSIS — Z8659 Personal history of other mental and behavioral disorders: Secondary | ICD-10-CM | POA: Diagnosis not present

## 2014-10-27 DIAGNOSIS — M1990 Unspecified osteoarthritis, unspecified site: Secondary | ICD-10-CM | POA: Diagnosis not present

## 2014-10-27 DIAGNOSIS — R51 Headache: Secondary | ICD-10-CM | POA: Diagnosis present

## 2014-10-27 DIAGNOSIS — Z79899 Other long term (current) drug therapy: Secondary | ICD-10-CM | POA: Insufficient documentation

## 2014-10-27 DIAGNOSIS — Z8619 Personal history of other infectious and parasitic diseases: Secondary | ICD-10-CM | POA: Diagnosis not present

## 2014-10-27 DIAGNOSIS — E119 Type 2 diabetes mellitus without complications: Secondary | ICD-10-CM | POA: Insufficient documentation

## 2014-10-27 MED ORDER — PROCHLORPERAZINE MALEATE 10 MG PO TABS: 10.0000 mg | ORAL_TABLET | Freq: Two times a day (BID) | ORAL | Status: DC | PRN

## 2014-10-27 MED ORDER — DIPHENHYDRAMINE HCL 25 MG PO TABS: 25.0000 mg | ORAL_TABLET | Freq: Four times a day (QID) | ORAL | Status: DC

## 2014-10-27 MED ORDER — DIPHENHYDRAMINE HCL 50 MG/ML IJ SOLN
25.0000 mg | Freq: Once | INTRAMUSCULAR | Status: AC
  Administered 2014-10-27: 25 mg via INTRAMUSCULAR
  Filled 2014-10-27: qty 1

## 2014-10-27 MED ORDER — KETOROLAC TROMETHAMINE 60 MG/2ML IM SOLN
60.0000 mg | Freq: Once | INTRAMUSCULAR | Status: AC
  Administered 2014-10-27: 60 mg via INTRAMUSCULAR
  Filled 2014-10-27: qty 2

## 2014-10-27 MED ORDER — PROCHLORPERAZINE EDISYLATE 5 MG/ML IJ SOLN
10.0000 mg | Freq: Once | INTRAMUSCULAR | Status: AC
  Administered 2014-10-27: 10 mg via INTRAMUSCULAR
  Filled 2014-10-27: qty 2

## 2014-10-27 NOTE — Discharge Instructions (Signed)
Tension Headache °A tension headache is pain, pressure, or aching felt over the front and sides of the head. Tension headaches often come after stress, feeling worried (anxiety), or feeling sad or down for a while (depressed). °HOME CARE °· Only take medicine as told by your doctor. °· Lie down in a dark, quiet room when you have a headache. °· Keep a journal to find out if certain things bring on headaches. For example, write down: °¨ What you eat and drink. °¨ How much sleep you get. °¨ Any change to your diet or medicines. °· Relax by getting a massage or doing other relaxing activities. °· Put ice or heat packs on the head and neck area as told by your doctor. °· Lessen stress. °· Sit up straight. Do not tighten (tense) your muscles. °· Quit smoking if you smoke. °· Lessen how much alcohol you drink. °· Lessen how much caffeine you drink, or stop drinking caffeine. °· Eat and exercise regularly. °· Get enough sleep. °· Avoid using too much pain medicine. °GET HELP RIGHT AWAY IF:  °· Your headache becomes really bad. °· You have a fever. °· You have a stiff neck. °· You have trouble seeing. °· Your muscles are weak, or you lose muscle control. °· You lose your balance or have trouble walking. °· You feel like you will pass out (faint), or you pass out. °· You have really bad symptoms that are different than your first symptoms. °· You have problems with the medicines given to you by your doctor. °· Your medicines do not work. °· Your headache feels different than the other headaches. °· You feel sick to your stomach (nauseous) or throw up (vomit). °MAKE SURE YOU:  °· Understand these instructions. °· Will watch your condition. °· Will get help right away if you are not doing well or get worse. °Document Released: 12/17/2009 Document Revised: 12/15/2011 Document Reviewed: 09/12/2011 °[employer]® Patient Information ©2015 [employer]. This information is not intended to replace advice given to you by your health  care provider. Make sure you discuss any questions you have with your health care provider. ° °

## 2014-10-27 NOTE — ED Provider Notes (Signed)
CSN: [id number]     Arrival date & time 10/27/14  1058 History   First MD Initiated Contact with Patient 10/27/14 1104     Chief Complaint  Patient presents with  . Headache     (Consider location/radiation/quality/duration/timing/severity/associated sxs/prior Treatment) HPI The patient reports he's had a headache for 3 days duration. It has been gradual in onset. He reports that it is concentrated over the right forehead above his eye. It is a constant aching in quality. The patient reports he's had headaches in the past but they usually don't last this long. He reports that he has taken ibuprofen without relief. There is no associated nausea or vomiting, no associated fever or neck pain. The patient denies recent sinus symptoms sore throat or earache. He has been staying with his fiance who is at the hospital. He reports he has been getting irregular sleep and under some increased stress with her in the hospital. The patient endorses periodic double vision which he reports is something that he suffered from for many years. There is not any associated with today's headache. Past Medical History  Diagnosis Date  . Hypertension   . Diabetes mellitus without complication   . Chronic hepatitis C without hepatic coma   . History of cocaine use   . Depression   . Arthritis   . History of TIA (transient ischemic attack)   . Hyperlipidemia   . History of migraine headaches   . Obesity, Class II, BMI 35-39.9    Past Surgical History  Procedure Laterality Date  . Rotator  cuff surgery       left shoulder    Family History  Problem Relation Age of Onset  . Hypertension Mother   . Diabetes Mother   . Heart disease Mother   . Thyroid disease Mother   . Hypertension Father   . Diabetes Father   . Heart disease Father    History  Substance Use Topics  . Smoking status: Former Smoker -- 30 years    Types: Cigarettes    Quit date: 09/27/2014  . Smokeless tobacco: Not on file  . Alcohol  Use: Yes     Comment: occasional alcohol use    Review of Systems 10 Systems reviewed and are negative for acute change except as noted in the HPI.    Allergies  Review of patient's allergies indicates no known allergies.  Home Medications   Prior to Admission medications   Medication Sig Start Date End Date Taking? Authorizing Provider  amLODipine (NORVASC) 10 MG tablet Take 1 tablet (10 mg total) by mouth daily. 12/02/13   Doris Cheadleeepak Advani, MD  diphenhydrAMINE (BENADRYL) 25 MG tablet Take 1 tablet (25 mg total) by mouth every 6 (six) hours. As needed with Compazine for headache. 10/27/14   Arby BarretteMarcy James Lafalce, MD  glucose blood test strip Use as instructed 12/02/13   Doris Cheadleeepak Advani, MD  insulin aspart (NOVOLOG FLEXPEN) 100 UNIT/ML FlexPen Use on a sliding scale between 5-15 units with each meal. Max daily dose 50 units per day. 07/25/14   Quentin Cornwalladhika P Phadke, MD  Insulin Glargine (LANTUS) 100 UNIT/ML Solostar Pen Inject 42 Units into the skin at bedtime. 07/25/14   Quentin Cornwalladhika P Phadke, MD  Insulin Pen Needle (EASY TOUCH PEN NEEDLES) 32G X 6 MM MISC Use with Flexpen to administer Insulin 08/04/14   Radhika P Phadke, MD  Lancets (FREESTYLE) lancets Use as instructed 12/02/13   Doris Cheadleeepak Advani, MD  metFORMIN (GLUCOPHAGE) 500 MG tablet Take 1,000 mg by  mouth 2 (two) times daily with a meal.     Historical Provider, MD  naproxen (NAPROSYN) 500 MG tablet Take 1 tablet (500 mg total) by mouth 2 (two) times daily. 10/01/14   Tilden Fossa, MD  oxyCODONE-acetaminophen (PERCOCET/ROXICET) 5-325 MG per tablet Take 1 tablet by mouth every 12 (twelve) hours as needed for severe pain.    Historical Provider, MD  prochlorperazine (COMPAZINE) 10 MG tablet Take 1 tablet (10 mg total) by mouth 2 (two) times daily as needed (Headache). Take with Benadryl 25 mg. 10/27/14   Arby Barrette, MD   BP 142/101 mmHg  Pulse 88  Temp(Src) 98.3 F (36.8 C) (Oral)  Resp 18  Ht  (1.778 m)  Wt 225 lb (102.059 kg)  BMI 32.28  kg/m2  SpO2 94% Physical Exam  Constitutional: He is oriented to person, place, and time. He appears well-developed and well-nourished.  HENT:  Head: Normocephalic and atraumatic.  Patient endorses tenderness to palpation over the right brow and right parietal region.  Eyes: EOM are normal. Pupils are equal, round, and reactive to light.  Neck: Neck supple.  Cardiovascular: Normal rate, regular rhythm, normal heart sounds and intact distal pulses.   Pulmonary/Chest: Effort normal and breath sounds normal.  Abdominal: Soft. Bowel sounds are normal. He exhibits no distension. There is no tenderness.  Musculoskeletal: Normal range of motion. He exhibits no edema.  Neurological: He is alert and oriented to person, place, and time. He has normal strength. No cranial nerve deficit. He exhibits normal muscle tone. Coordination normal. GCS eye subscore is 4. GCS verbal subscore is 5. GCS motor subscore is 6.  Skin: Skin is warm, dry and intact.  Psychiatric: He has a normal mood and affect.    ED Course  Procedures (including critical care time) Labs Review Labs Reviewed - No data to display  Imaging Review No results found.   EKG Interpretation None     13:10 the patient reports his headache is significantly improved after treatment. He now reports it is a light pressure 2/ 10. MDM   Final diagnoses:  Episodic tension-type headache, not intractable   At this point I suspect tension headache with irregular sleep pattern and increased recent stress. The patient does not have other concerning neurologic symptoms. The headache was not of acute onset. The patient does report history of prior headaches. No associated symptoms to suggest infectious etiology.    Arby Barrette, MD 10/27/14 1320

## 2014-10-27 NOTE — ED Notes (Signed)
Pt reports he has had a headache X3 days. Pt states he took ibuprofen this morning at 0600 with no relief. Pt reports some dizziness, denies blurred vision or nausea.

## 2014-11-30 ENCOUNTER — Encounter (HOSPITAL_COMMUNITY): Payer: Self-pay

## 2014-11-30 ENCOUNTER — Emergency Department (HOSPITAL_COMMUNITY): Payer: Medicaid Other

## 2014-11-30 ENCOUNTER — Emergency Department (HOSPITAL_COMMUNITY)
Admission: EM | Admit: 2014-11-30 | Discharge: 2014-11-30 | Discharge status: Against Medical Advice | Payer: Medicaid Other | Attending: Emergency Medicine | Admitting: Emergency Medicine

## 2014-11-30 DIAGNOSIS — E669 Obesity, unspecified: Secondary | ICD-10-CM | POA: Insufficient documentation

## 2014-11-30 DIAGNOSIS — E119 Type 2 diabetes mellitus without complications: Secondary | ICD-10-CM | POA: Diagnosis not present

## 2014-11-30 DIAGNOSIS — Z8659 Personal history of other mental and behavioral disorders: Secondary | ICD-10-CM | POA: Insufficient documentation

## 2014-11-30 DIAGNOSIS — R51 Headache: Secondary | ICD-10-CM

## 2014-11-30 DIAGNOSIS — R110 Nausea: Secondary | ICD-10-CM | POA: Diagnosis not present

## 2014-11-30 DIAGNOSIS — Z8673 Personal history of transient ischemic attack (TIA), and cerebral infarction without residual deficits: Secondary | ICD-10-CM | POA: Insufficient documentation

## 2014-11-30 DIAGNOSIS — R200 Anesthesia of skin: Secondary | ICD-10-CM | POA: Insufficient documentation

## 2014-11-30 DIAGNOSIS — Z794 Long term (current) use of insulin: Secondary | ICD-10-CM | POA: Diagnosis not present

## 2014-11-30 DIAGNOSIS — I10 Essential (primary) hypertension: Secondary | ICD-10-CM | POA: Diagnosis not present

## 2014-11-30 DIAGNOSIS — Z79899 Other long term (current) drug therapy: Secondary | ICD-10-CM | POA: Diagnosis not present

## 2014-11-30 DIAGNOSIS — M1990 Unspecified osteoarthritis, unspecified site: Secondary | ICD-10-CM | POA: Insufficient documentation

## 2014-11-30 DIAGNOSIS — R519 Headache, unspecified: Secondary | ICD-10-CM

## 2014-11-30 DIAGNOSIS — Z8619 Personal history of other infectious and parasitic diseases: Secondary | ICD-10-CM | POA: Insufficient documentation

## 2014-11-30 DIAGNOSIS — Z791 Long term (current) use of non-steroidal anti-inflammatories (NSAID): Secondary | ICD-10-CM | POA: Diagnosis not present

## 2014-11-30 DIAGNOSIS — H53149 Visual discomfort, unspecified: Secondary | ICD-10-CM | POA: Diagnosis not present

## 2014-11-30 DIAGNOSIS — Z87891 Personal history of nicotine dependence: Secondary | ICD-10-CM | POA: Insufficient documentation

## 2014-11-30 DIAGNOSIS — G43909 Migraine, unspecified, not intractable, without status migrainosus: Secondary | ICD-10-CM | POA: Insufficient documentation

## 2014-11-30 LAB — CBC WITH DIFFERENTIAL/PLATELET
BASOS PCT: 0 % (ref 0–1)
Basophils Absolute: 0 10*3/uL (ref 0.0–0.1)
EOS PCT: 2 % (ref 0–5)
Eosinophils Absolute: 0.1 10*3/uL (ref 0.0–0.7)
HEMATOCRIT: 43.5 % (ref 39.0–52.0)
Hemoglobin: 14.9 g/dL (ref 13.0–17.0)
Lymphocytes Relative: 65 % — ABNORMAL HIGH (ref 12–46)
Lymphs Abs: 3.3 10*3/uL (ref 0.7–4.0)
MCH: 29.6 pg (ref 26.0–34.0)
MCHC: 34.3 g/dL (ref 30.0–36.0)
MCV: 86.3 fL (ref 78.0–100.0)
MONO ABS: 0.3 10*3/uL (ref 0.1–1.0)
MONOS PCT: 6 % (ref 3–12)
NEUTROS ABS: 1.4 10*3/uL — AB (ref 1.7–7.7)
Neutrophils Relative %: 27 % — ABNORMAL LOW (ref 43–77)
Platelets: 170 10*3/uL (ref 150–400)
RBC: 5.04 MIL/uL (ref 4.22–5.81)
RDW: 13.6 % (ref 11.5–15.5)
WBC: 5.1 10*3/uL (ref 4.0–10.5)

## 2014-11-30 LAB — BASIC METABOLIC PANEL
Anion gap: 5 (ref 5–15)
BUN: 8 mg/dL (ref 6–23)
CHLORIDE: 101 mmol/L (ref 96–112)
CO2: 27 mmol/L (ref 19–32)
Calcium: 8.7 mg/dL (ref 8.4–10.5)
Creatinine, Ser: 0.85 mg/dL (ref 0.50–1.35)
GLUCOSE: 249 mg/dL — AB (ref 70–99)
POTASSIUM: 4 mmol/L (ref 3.5–5.1)
SODIUM: 133 mmol/L — AB (ref 135–145)

## 2014-11-30 MED ORDER — IBUPROFEN 800 MG PO TABS
800.0000 mg | ORAL_TABLET | Freq: Once | ORAL | Status: AC
  Administered 2014-11-30: 800 mg via ORAL
  Filled 2014-11-30: qty 1

## 2014-11-30 NOTE — ED Notes (Signed)
Pt returned from CT. Requesting to leave.

## 2014-11-30 NOTE — Discharge Instructions (Signed)

## 2014-11-30 NOTE — ED Notes (Signed)
Pt reports needing to be "somewhere now". Pt told RN will call CT to see when he will go to CT.

## 2014-11-30 NOTE — ED Notes (Signed)
CT called rt when pt will be taken to CT.

## 2014-11-30 NOTE — ED Notes (Signed)
Pt is in stable condition and leaves AMA. Pt states he has somewhere he HAS to be and is unable to stay.

## 2014-11-30 NOTE — ED Provider Notes (Signed)
CSN: [id number]     Arrival date & time 11/30/14  1217 History   First MD Initiated Contact with Patient 11/30/14 1502     No chief complaint on file.    (Consider location/radiation/quality/duration/timing/severity/associated sxs/prior Treatment) HPI Comments: Patient with hx of HTN, DM, HL, hx of migraines presents to the ED with a chief complaint of headache.  He states that he has been having intermittent headaches for years and was reportedly diagnosed with a malignant tumor which then disappeared.  He states that the headache has been more prominent in the past 2 days.  He states that it radiates from the top of his head to behind his right eye.  He denies any fevers, chills, or vomiting.  States that he has had some intermittent upper extremity numbness, but no weakness.  Reports associated nausea and photophobia, but no vomiting.  He states he has tried taking OTC meds with no relief.  The history is provided by the patient. No language interpreter was used.    Past Medical History  Diagnosis Date  . Hypertension   . Diabetes mellitus without complication   . Chronic hepatitis C without hepatic coma   . History of cocaine use   . Depression   . Arthritis   . History of TIA (transient ischemic attack)   . Hyperlipidemia   . History of migraine headaches   . Obesity, Class II, BMI 35-39.9    Past Surgical History  Procedure Laterality Date  . Rotator  cuff surgery       left shoulder    Family History  Problem Relation Age of Onset  . Hypertension Mother   . Diabetes Mother   . Heart disease Mother   . Thyroid disease Mother   . Hypertension Father   . Diabetes Father   . Heart disease Father    History  Substance Use Topics  . Smoking status: Former Smoker -- 30 years    Types: Cigarettes    Quit date: 09/27/2014  . Smokeless tobacco: Not on file  . Alcohol Use: Yes     Comment: occasional alcohol use    Review of Systems  Constitutional: Negative for fever  and chills.  Respiratory: Negative for shortness of breath.   Cardiovascular: Negative for chest pain.  Gastrointestinal: Negative for nausea, vomiting, diarrhea and constipation.  Genitourinary: Negative for dysuria.  Musculoskeletal: Positive for arthralgias.  Neurological: Positive for numbness and headaches.  All other systems reviewed and are negative.     Allergies  Review of patient's allergies indicates no known allergies.  Home Medications   Prior to Admission medications   Medication Sig Start Date End Date Taking? Authorizing Provider  amLODipine (NORVASC) 10 MG tablet Take 1 tablet (10 mg total) by mouth daily. 12/02/13  Yes Doris Cheadle, MD  diphenhydrAMINE (BENADRYL) 25 MG tablet Take 1 tablet (25 mg total) by mouth every 6 (six) hours. As needed with Compazine for headache. 10/27/14  Yes Arby Barrette, MD  glucose blood test strip Use as instructed 12/02/13  Yes Deepak Advani, MD  insulin aspart (NOVOLOG FLEXPEN) 100 UNIT/ML FlexPen Use on a sliding scale between 5-15 units with each meal. Max daily dose 50 units per day. 07/25/14  Yes Radhika P Phadke, MD  Insulin Glargine (LANTUS) 100 UNIT/ML Solostar Pen Inject 42 Units into the skin at bedtime. 07/25/14  Yes Radhika P Phadke, MD  Insulin Pen Needle (EASY TOUCH PEN NEEDLES) 32G X 6 MM MISC Use with Flexpen to administer Insulin  08/04/14  Yes Quentin Cornwalladhika P Phadke, MD  Lancets (FREESTYLE) lancets Use as instructed 12/02/13  Yes Deepak Advani, MD  metFORMIN (GLUCOPHAGE) 500 MG tablet Take 1,000 mg by mouth 2 (two) times daily with a meal.    Yes Historical Provider, MD  naproxen (NAPROSYN) 500 MG tablet Take 1 tablet (500 mg total) by mouth 2 (two) times daily. 10/01/14  Yes Tilden FossaElizabeth Rees, MD  oxyCODONE-acetaminophen (PERCOCET/ROXICET) 5-325 MG per tablet Take 1 tablet by mouth every 12 (twelve) hours as needed for severe pain.   Yes Historical Provider, MD  prochlorperazine (COMPAZINE) 10 MG tablet Take 1 tablet (10 mg total)  by mouth 2 (two) times daily as needed (Headache). Take with Benadryl 25 mg. 10/27/14  Yes Arby BarretteMarcy Pfeiffer, MD   BP 130/95 mmHg  Pulse 77  Temp(Src) 97.8 F (36.6 C) (Oral)  Resp 16  Ht 5\' 10"  (1.778 m)  Wt 233 lb (105.688 kg)  BMI 33.43 kg/m2  SpO2 96% Physical Exam  Constitutional: He is oriented to person, place, and time. He appears well-developed and well-nourished.  HENT:  Head: Normocephalic and atraumatic.  Right Ear: External ear normal.  Left Ear: External ear normal.  Eyes: Conjunctivae and EOM are normal. Pupils are equal, round, and reactive to light.  Neck: Normal range of motion. Neck supple.  No pain with neck flexion, no meningismus  Cardiovascular: Normal rate, regular rhythm and normal heart sounds.  Exam reveals no gallop and no friction rub.   No murmur heard. Pulmonary/Chest: Effort normal and breath sounds normal. No respiratory distress. He has no wheezes. He has no rales. He exhibits no tenderness.  Abdominal: Soft. He exhibits no distension and no mass. There is no tenderness. There is no rebound and no guarding.  Musculoskeletal: Normal range of motion. He exhibits no edema or tenderness.  Normal gait.  Neurological: He is alert and oriented to person, place, and time. He has normal reflexes.  CN 3-12 intact, normal finger to nose, no pronator drift, sensation and strength intact bilaterally.  Skin: Skin is warm and dry.  Psychiatric: He has a normal mood and affect. His behavior is normal. Judgment and thought content normal.  Nursing note and vitals reviewed.   ED Course  Procedures (including critical care time) Results for orders placed or performed during the hospital encounter of 11/30/14  CBC with Differential/Platelet  Result Value Ref Range   WBC 5.1 4.0 - 10.5 K/uL   RBC 5.04 4.22 - 5.81 MIL/uL   Hemoglobin 14.9 13.0 - 17.0 g/dL   HCT 16.143.5 09.639.0 - 04.552.0 %   MCV 86.3 78.0 - 100.0 fL   MCH 29.6 26.0 - 34.0 pg   MCHC 34.3 30.0 - 36.0 g/dL    RDW 40.913.6 81.111.5 - 91.415.5 %   Platelets 170 150 - 400 K/uL   Neutrophils Relative % 27 (L) 43 - 77 %   Neutro Abs 1.4 (L) 1.7 - 7.7 K/uL   Lymphocytes Relative 65 (H) 12 - 46 %   Lymphs Abs 3.3 0.7 - 4.0 K/uL   Monocytes Relative 6 3 - 12 %   Monocytes Absolute 0.3 0.1 - 1.0 K/uL   Eosinophils Relative 2 0 - 5 %   Eosinophils Absolute 0.1 0.0 - 0.7 K/uL   Basophils Relative 0 0 - 1 %   Basophils Absolute 0.0 0.0 - 0.1 K/uL  Basic metabolic panel  Result Value Ref Range   Sodium 133 (L) 135 - 145 mmol/L   Potassium 4.0 3.5 -  5.1 mmol/L   Chloride 101 96 - 112 mmol/L   CO2 27 19 - 32 mmol/L   Glucose, Bld 249 (H) 70 - 99 mg/dL   BUN 8 6 - 23 mg/dL   Creatinine, Ser 1.61 0.50 - 1.35 mg/dL   Calcium 8.7 8.4 - 09.6 mg/dL   GFR calc non Af Amer >90 >90 mL/min   GFR calc Af Amer >90 >90 mL/min   Anion gap 5 5 - 15   Ct Head Wo Contrast  11/30/2014   CLINICAL DATA:  Two day history of headache.  EXAM: CT HEAD WITHOUT CONTRAST  TECHNIQUE: Contiguous axial images were obtained from the base of the skull through the vertex without intravenous contrast.  COMPARISON:  None.  FINDINGS: Mild bilateral patchy areas of low attenuation within the subcortical white matter are identified. No acute cortical infarct, hemorrhage, or mass lesion ispresent. Ventricles are of normal size. No significant extra-axial fluid collection is present. The paranasal sinuses andmastoid air cells are clear. The osseous skull is intact.  IMPRESSION: 1. No acute intracranial abnormalities. 2. Nonspecific white matter abnormality likely related to chronic microvascular disease.   Electronically Signed   By: Signa Kell M.D.   On: 11/30/2014 17:34      EKG Interpretation None      MDM   Final diagnoses:  Headache, unspecified headache type    5:12 PM Patient states that he cannot wait any longer.  States that he has to leave.  CT just arrived to get the patient.  Will take to CT.  Presentation is like pts typical  HA and non concerning for [facility], [facility], Meningitis, or temporal arteritis. Pt is afebrile with no focal neuro deficits, nuchal rigidity, or change in vision. Pt is to follow up with PCP to discuss prophylactic medication. Pt verbalizes understanding and is agreeable with plan to dc.    Patients that they would like to leave.  I have discussed my concerns with the patient about them leaving without completing the evaluation.    Patient presents with headache  Symptoms include: headache  Concern for: mass occupying lesion  Study limitations and other tests offered include: CT still pending when patient left  Treatment and recommended follow-up include: Wait for CT and treat accordingly  I do not feel that the patient should leave prior to completing their workup. I have discussed the above symptoms, initial findings, study limitations, and treatment plan with the patient. Patient is not altered, does not have any distracting issues, and has capacity to make decisions for themselves. Patient places themselves at risk of misdiagnosis 2/2 results not being back, worsening symptoms, disability, morbidity and/or death.   5:44 PM CT reviewed.  No indication to call patient back to ED.    Roxy Horseman, PA-C 11/30/14 1744  Elwin Mocha, MD 11/30/14 [phone]

## 2014-11-30 NOTE — ED Notes (Signed)
Pt presents with 2 day h/o headache.  Pt reports pain to top of head and radiates behind R eye.  Pt taking OTC med without relief.  +nausea and photophobia.

## 2014-12-22 ENCOUNTER — Emergency Department (HOSPITAL_COMMUNITY)
Admission: EM | Admit: 2014-12-22 | Discharge: 2014-12-22 | Disposition: A | Discharge status: Home / Self Care | Payer: Medicaid Other | Attending: Emergency Medicine | Admitting: Emergency Medicine

## 2014-12-22 ENCOUNTER — Encounter (HOSPITAL_COMMUNITY): Payer: Self-pay | Admitting: *Deleted

## 2014-12-22 DIAGNOSIS — Z8619 Personal history of other infectious and parasitic diseases: Secondary | ICD-10-CM | POA: Diagnosis not present

## 2014-12-22 DIAGNOSIS — E119 Type 2 diabetes mellitus without complications: Secondary | ICD-10-CM | POA: Diagnosis not present

## 2014-12-22 DIAGNOSIS — M1990 Unspecified osteoarthritis, unspecified site: Secondary | ICD-10-CM | POA: Insufficient documentation

## 2014-12-22 DIAGNOSIS — Z87891 Personal history of nicotine dependence: Secondary | ICD-10-CM | POA: Diagnosis not present

## 2014-12-22 DIAGNOSIS — Z79899 Other long term (current) drug therapy: Secondary | ICD-10-CM | POA: Insufficient documentation

## 2014-12-22 DIAGNOSIS — R519 Headache, unspecified: Secondary | ICD-10-CM

## 2014-12-22 DIAGNOSIS — E669 Obesity, unspecified: Secondary | ICD-10-CM | POA: Insufficient documentation

## 2014-12-22 DIAGNOSIS — Z794 Long term (current) use of insulin: Secondary | ICD-10-CM | POA: Diagnosis not present

## 2014-12-22 DIAGNOSIS — R51 Headache: Secondary | ICD-10-CM | POA: Diagnosis present

## 2014-12-22 DIAGNOSIS — I10 Essential (primary) hypertension: Secondary | ICD-10-CM | POA: Diagnosis not present

## 2014-12-22 DIAGNOSIS — Z791 Long term (current) use of non-steroidal anti-inflammatories (NSAID): Secondary | ICD-10-CM | POA: Diagnosis not present

## 2014-12-22 DIAGNOSIS — Z8659 Personal history of other mental and behavioral disorders: Secondary | ICD-10-CM | POA: Diagnosis not present

## 2014-12-22 DIAGNOSIS — Z8673 Personal history of transient ischemic attack (TIA), and cerebral infarction without residual deficits: Secondary | ICD-10-CM | POA: Insufficient documentation

## 2014-12-22 MED ORDER — METOCLOPRAMIDE HCL 5 MG/ML IJ SOLN
10.0000 mg | INTRAMUSCULAR | Status: AC
  Administered 2014-12-22: 10 mg via INTRAVENOUS
  Filled 2014-12-22: qty 2

## 2014-12-22 MED ORDER — METOCLOPRAMIDE HCL 10 MG PO TABS: 10.0000 mg | ORAL_TABLET | Freq: Three times a day (TID) | ORAL | Status: DC | PRN

## 2014-12-22 MED ORDER — DIPHENHYDRAMINE HCL 50 MG/ML IJ SOLN
25.0000 mg | Freq: Once | INTRAMUSCULAR | Status: AC
Start: 2014-12-22 — End: 2014-12-22
  Administered 2014-12-22: 25 mg via INTRAVENOUS
  Filled 2014-12-22: qty 1

## 2014-12-22 MED ORDER — SODIUM CHLORIDE 0.9 % IV BOLUS (SEPSIS)
1000.0000 mL | Freq: Once | INTRAVENOUS | Status: AC
  Administered 2014-12-22: 1000 mL via INTRAVENOUS

## 2014-12-22 MED ORDER — AMLODIPINE BESYLATE 5 MG PO TABS
10.0000 mg | ORAL_TABLET | Freq: Once | ORAL | Status: AC
  Administered 2014-12-22: 10 mg via ORAL
  Filled 2014-12-22: qty 2

## 2014-12-22 NOTE — ED Provider Notes (Signed)
CSN: [id number]     Arrival date & time 12/22/14  1242 History   First MD Initiated Contact with Patient 12/22/14 1456     Chief Complaint  Patient presents with  . Headache     (Consider location/radiation/quality/duration/timing/severity/associated sxs/prior Treatment) Patient is a 59 y.o. male presenting with headaches. The history is provided by the patient and medical records.  Headache   This is a 59 y.o. M with PMH significant for HTN, DM2, hep C, depression, hyperlipidemia, migraine headaches, presenting to the ED for headache.  Headache has been present for the past 2 days and has been persistent.  States throbbing headache throughout his entire head.  Denies associated photophobia, phonophobia, aura, dizziness, weakness, lightheadedness, nausea, or vomiting.  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  Patient does admit to recent increased stress due to having to stay at a homeless shelter recently.  His significant other at the bedside states that when he gets stressed out his headaches tend to worsen.  He denies fever, neck pain, or other recent illness. Patient is not currently on any type of anti-coagulation.  Patient hypertensive on arrival--states his PCP recently increased dose of his norvasc and he has been compliant with meds at home.  Has taken excedrin migraine PTA without relief.  Past Medical History  Diagnosis Date  . Hypertension   . Diabetes mellitus without complication   . Chronic hepatitis C without hepatic coma   . History of cocaine use   . Depression   . Arthritis   . History of TIA (transient ischemic attack)   . Hyperlipidemia   . History of migraine headaches   . Obesity, Class II, BMI 35-39.9    Past Surgical History  Procedure Laterality Date  . Rotator  cuff surgery       left shoulder    Family History  Problem Relation Age of Onset  . Hypertension Mother   . Diabetes  Mother   . Heart disease Mother   . Thyroid disease Mother   . Hypertension Father   . Diabetes Father   . Heart disease Father    History  Substance Use Topics  . Smoking status: Former Smoker -- 30 years    Types: Cigarettes    Quit date: 09/27/2014  . Smokeless tobacco: Not on file  . Alcohol Use: Yes     Comment: occasional alcohol use    Review of Systems  Neurological: Positive for headaches.  All other systems reviewed and are negative.     Allergies  Other  Home Medications   Prior to Admission medications   Medication Sig Start Date End Date Taking? Authorizing Provider  amLODipine (NORVASC) 10 MG tablet Take 1 tablet (10 mg total) by mouth daily. 12/02/13   Doris Cheadle, MD  diphenhydrAMINE (BENADRYL) 25 MG tablet Take 1 tablet (25 mg total) by mouth every 6 (six) hours. As needed with Compazine for headache. 10/27/14   Arby Barrette, MD  glucose blood test strip Use as instructed 12/02/13   Doris Cheadle, MD  insulin aspart (NOVOLOG FLEXPEN) 100 UNIT/ML FlexPen Use on a sliding scale between 5-15 units with each meal. Max daily dose 50 units per day. 07/25/14   Quentin Cornwall, MD  Insulin Glargine (LANTUS) 100 UNIT/ML Solostar Pen Inject 42 Units into the skin at bedtime. 07/25/14   Quentin Cornwall, MD  Insulin Pen Needle (EASY TOUCH PEN  NEEDLES) 32G X 6 MM MISC Use with Flexpen to administer Insulin 08/04/14   Radhika P Phadke, MD  Lancets (FREESTYLE) lancets Use as instructed 12/02/13   Doris Cheadleeepak Advani, MD  metFORMIN (GLUCOPHAGE) 500 MG tablet Take 1,000 mg by mouth 2 (two) times daily with a meal.     Historical Provider, MD  naproxen (NAPROSYN) 500 MG tablet Take 1 tablet (500 mg total) by mouth 2 (two) times daily. 10/01/14   Tilden FossaElizabeth Rees, MD  oxyCODONE-acetaminophen (PERCOCET/ROXICET) 5-325 MG per tablet Take 1 tablet by mouth every 12 (twelve) hours as needed for severe pain.    Historical Provider, MD  prochlorperazine (COMPAZINE) 10 MG tablet Take 1  tablet (10 mg total) by mouth 2 (two) times daily as needed (Headache). Take with Benadryl 25 mg. 10/27/14   Arby BarretteMarcy Pfeiffer, MD   BP 168/111 mmHg  Pulse 95  Temp(Src) 98 F (36.7 C) (Oral)  Resp 18  SpO2 96%   Physical Exam  Constitutional: He is oriented to person, place, and time. He appears well-developed and well-nourished. No distress.  HENT:  Head: Normocephalic and atraumatic.  Mouth/Throat: Oropharynx is clear and moist.  Eyes: Conjunctivae and EOM are normal. Pupils are equal, round, and reactive to light.  Neck: Normal range of motion and full passive range of motion without pain. Neck supple. No spinous process tenderness present. No rigidity.  No meningeal signs  Cardiovascular: Normal rate, regular rhythm and normal heart sounds.   Pulmonary/Chest: Effort normal and breath sounds normal. No respiratory distress. He has no wheezes.  Abdominal: Soft. Bowel sounds are normal. There is no tenderness. There is no guarding.  Musculoskeletal: Normal range of motion.  Neurological: He is alert and oriented to person, place, and time.  AAOx3, answering questions appropriately; equal strength UE and LE bilaterally; CN grossly intact; moves all extremities appropriately without ataxia; no focal neuro deficits or facial asymmetry appreciated  Skin: Skin is warm and dry. He is not diaphoretic.  Psychiatric: He has a normal mood and affect.  Nursing note and vitals reviewed.   ED Course  Procedures (including critical care time) Labs Review Labs Reviewed - No data to display  Imaging Review No results found.   EKG Interpretation None      MDM   Final diagnoses:  Headache, unspecified headache type   59 y.o. M here with 2 days of throbbing headache.  He does have hx of migraines.  No current anti-coagulant use.  On exam, patient afebrile and non-toxic in appearance.  He has no clinical signs of meningitis.  Neurologic exam is non-focal.  He does admit to increased stress  recently which may be increased his BP thus making headache worse.  Patient had a head CT last month on 11/30/14 which was normal, notably no tumor visualized.  Will treat with migraine cocktail and reassess.  Patient also given additional dose of home BP meds.  5:22 PM After migraine cocktail patient states he is feeling better.  He states he is ready to go home.  Neuro exam remains non-focal.  BP improved after additional dose of norvasc.  Rx reglan for continued use at home if needed with benadryl.  Encouraged FU with PCP regarding blood pressure.  Discussed plan with patient, he/she acknowledged understanding and agreed with plan of care.  Return precautions given for new or worsening symptoms.  Garlon HatchetLisa M Chamia Schmutz, PA-C 12/22/14 2110  Rolland PorterMark James, MD 12/23/14 2216

## 2014-12-22 NOTE — ED Notes (Signed)
Pt reports having headache x 2 days. Hx of migraines, denies n/v or sensitivity to light. Pt took excedrin approx 1 hr ago. No acute distress noted at triage.

## 2014-12-22 NOTE — Discharge Instructions (Signed)
Take the prescribed medication as directed.  May take with benadryl for headache. Follow-up with your primary care physician regarding your blood pressure. Return to the ED for new or worsening symptoms.

## 2014-12-22 NOTE — ED Notes (Signed)
Collected urine sample, placed at bedside.

## 2014-12-25 ENCOUNTER — Encounter (HOSPITAL_COMMUNITY): Payer: Self-pay | Admitting: Emergency Medicine

## 2014-12-25 ENCOUNTER — Emergency Department (HOSPITAL_COMMUNITY)
Admission: EM | Admit: 2014-12-25 | Discharge: 2014-12-25 | Disposition: A | Discharge status: Home / Self Care | Payer: Medicaid Other | Attending: Emergency Medicine | Admitting: Emergency Medicine

## 2014-12-25 DIAGNOSIS — R110 Nausea: Secondary | ICD-10-CM | POA: Diagnosis present

## 2014-12-25 DIAGNOSIS — Z794 Long term (current) use of insulin: Secondary | ICD-10-CM | POA: Insufficient documentation

## 2014-12-25 DIAGNOSIS — Z8619 Personal history of other infectious and parasitic diseases: Secondary | ICD-10-CM | POA: Insufficient documentation

## 2014-12-25 DIAGNOSIS — M1990 Unspecified osteoarthritis, unspecified site: Secondary | ICD-10-CM | POA: Diagnosis not present

## 2014-12-25 DIAGNOSIS — Z8673 Personal history of transient ischemic attack (TIA), and cerebral infarction without residual deficits: Secondary | ICD-10-CM | POA: Diagnosis not present

## 2014-12-25 DIAGNOSIS — E119 Type 2 diabetes mellitus without complications: Secondary | ICD-10-CM | POA: Diagnosis not present

## 2014-12-25 DIAGNOSIS — Z87891 Personal history of nicotine dependence: Secondary | ICD-10-CM | POA: Insufficient documentation

## 2014-12-25 DIAGNOSIS — G43909 Migraine, unspecified, not intractable, without status migrainosus: Secondary | ICD-10-CM | POA: Diagnosis not present

## 2014-12-25 DIAGNOSIS — E669 Obesity, unspecified: Secondary | ICD-10-CM | POA: Diagnosis not present

## 2014-12-25 DIAGNOSIS — Z791 Long term (current) use of non-steroidal anti-inflammatories (NSAID): Secondary | ICD-10-CM | POA: Diagnosis not present

## 2014-12-25 DIAGNOSIS — Z79899 Other long term (current) drug therapy: Secondary | ICD-10-CM | POA: Insufficient documentation

## 2014-12-25 DIAGNOSIS — R51 Headache: Secondary | ICD-10-CM

## 2014-12-25 DIAGNOSIS — Z8659 Personal history of other mental and behavioral disorders: Secondary | ICD-10-CM | POA: Diagnosis not present

## 2014-12-25 DIAGNOSIS — I10 Essential (primary) hypertension: Secondary | ICD-10-CM | POA: Insufficient documentation

## 2014-12-25 DIAGNOSIS — R519 Headache, unspecified: Secondary | ICD-10-CM

## 2014-12-25 LAB — COMPREHENSIVE METABOLIC PANEL
ALK PHOS: 73 U/L (ref 39–117)
ALT: 38 U/L (ref 0–53)
ANION GAP: 5 (ref 5–15)
AST: 33 U/L (ref 0–37)
Albumin: 4 g/dL (ref 3.5–5.2)
BUN: 7 mg/dL (ref 6–23)
CO2: 28 mmol/L (ref 19–32)
Calcium: 9.2 mg/dL (ref 8.4–10.5)
Chloride: 100 mmol/L (ref 96–112)
Creatinine, Ser: 0.73 mg/dL (ref 0.50–1.35)
Glucose, Bld: 202 mg/dL — ABNORMAL HIGH (ref 70–99)
POTASSIUM: 4.2 mmol/L (ref 3.5–5.1)
SODIUM: 133 mmol/L — AB (ref 135–145)
Total Bilirubin: 1 mg/dL (ref 0.3–1.2)
Total Protein: 7.5 g/dL (ref 6.0–8.3)

## 2014-12-25 LAB — URINALYSIS, ROUTINE W REFLEX MICROSCOPIC
Bilirubin Urine: NEGATIVE
Hgb urine dipstick: NEGATIVE
Ketones, ur: 15 mg/dL — AB
Leukocytes, UA: NEGATIVE
Nitrite: NEGATIVE
Protein, ur: NEGATIVE mg/dL
Specific Gravity, Urine: 1.017 (ref 1.005–1.030)
Urobilinogen, UA: 0.2 mg/dL (ref 0.0–1.0)
pH: 7.5 (ref 5.0–8.0)

## 2014-12-25 LAB — CBC WITH DIFFERENTIAL/PLATELET
Basophils Absolute: 0 10*3/uL (ref 0.0–0.1)
Basophils Relative: 0 % (ref 0–1)
Eosinophils Absolute: 0 10*3/uL (ref 0.0–0.7)
Eosinophils Relative: 1 % (ref 0–5)
HEMATOCRIT: 46.7 % (ref 39.0–52.0)
Hemoglobin: 16.3 g/dL (ref 13.0–17.0)
LYMPHS PCT: 44 % (ref 12–46)
Lymphs Abs: 2.2 10*3/uL (ref 0.7–4.0)
MCH: 30.4 pg (ref 26.0–34.0)
MCHC: 34.9 g/dL (ref 30.0–36.0)
MCV: 87.1 fL (ref 78.0–100.0)
MONO ABS: 0.3 10*3/uL (ref 0.1–1.0)
MONOS PCT: 7 % (ref 3–12)
Neutro Abs: 2.4 10*3/uL (ref 1.7–7.7)
Neutrophils Relative %: 48 % (ref 43–77)
Platelets: 181 10*3/uL (ref 150–400)
RBC: 5.36 MIL/uL (ref 4.22–5.81)
RDW: 13.9 % (ref 11.5–15.5)
WBC: 5 10*3/uL (ref 4.0–10.5)

## 2014-12-25 LAB — RAPID URINE DRUG SCREEN, HOSP PERFORMED
AMPHETAMINES: NOT DETECTED
Barbiturates: NOT DETECTED
Benzodiazepines: NOT DETECTED
Cocaine: NOT DETECTED
Opiates: NOT DETECTED
TETRAHYDROCANNABINOL: NOT DETECTED

## 2014-12-25 LAB — URINE MICROSCOPIC-ADD ON

## 2014-12-25 MED ORDER — DIPHENHYDRAMINE HCL 50 MG/ML IJ SOLN
25.0000 mg | Freq: Once | INTRAMUSCULAR | Status: AC
  Administered 2014-12-25: 25 mg via INTRAVENOUS
  Filled 2014-12-25: qty 1

## 2014-12-25 MED ORDER — METOCLOPRAMIDE HCL 5 MG/ML IJ SOLN
10.0000 mg | Freq: Once | INTRAMUSCULAR | Status: AC
  Administered 2014-12-25: 10 mg via INTRAVENOUS
  Filled 2014-12-25: qty 2

## 2014-12-25 MED ORDER — KETOROLAC TROMETHAMINE 30 MG/ML IJ SOLN
30.0000 mg | Freq: Once | INTRAMUSCULAR | Status: AC
  Administered 2014-12-25: 30 mg via INTRAVENOUS
  Filled 2014-12-25: qty 1

## 2014-12-25 MED ORDER — SODIUM CHLORIDE 0.9 % IV BOLUS (SEPSIS)
1000.0000 mL | Freq: Once | INTRAVENOUS | Status: AC
  Administered 2014-12-25: 1000 mL via INTRAVENOUS

## 2014-12-25 MED ORDER — MAGNESIUM SULFATE IN D5W 10-5 MG/ML-% IV SOLN
1.0000 g | Freq: Once | INTRAVENOUS | Status: AC
  Administered 2014-12-25: 1 g via INTRAVENOUS
  Filled 2014-12-25 (×2): qty 100

## 2014-12-25 NOTE — Discharge Instructions (Signed)
Make sure to get plenty of rest and sleep. Make sure to take all your medications. Follow up with your doctor.   General Headache Without Cause A headache is pain or discomfort felt around the head or neck area. The specific cause of a headache may not be found. There are many causes and types of headaches. A few common ones are:  Tension headaches.  Migraine headaches.  Cluster headaches.  Chronic daily headaches. HOME CARE INSTRUCTIONS   Keep all follow-up appointments with your caregiver or any specialist referral.  Only take over-the-counter or prescription medicines for pain or discomfort as directed by your caregiver.  Lie down in a dark, quiet room when you have a headache.  Keep a headache journal to find out what may trigger your migraine headaches. For example, write down:  What you eat and drink.  How much sleep you get.  Any change to your diet or medicines.  Try massage or other relaxation techniques.  Put ice packs or heat on the head and neck. Use these 3 to 4 times per day for 15 to 20 minutes each time, or as needed.  Limit stress.  Sit up straight, and do not tense your muscles.  Quit smoking if you smoke.  Limit alcohol use.  Decrease the amount of caffeine you drink, or stop drinking caffeine.  Eat and sleep on a regular schedule.  Get 7 to 9 hours of sleep, or as recommended by your caregiver.  Keep lights dim if bright lights bother you and make your headaches worse. SEEK MEDICAL CARE IF:   You have problems with the medicines you were prescribed.  Your medicines are not working.  You have a change from the usual headache.  You have nausea or vomiting. SEEK IMMEDIATE MEDICAL CARE IF:   Your headache becomes severe.  You have a fever.  You have a stiff neck.  You have loss of vision.  You have muscular weakness or loss of muscle control.  You start losing your balance or have trouble walking.  You feel faint or pass  out.  You have severe symptoms that are different from your first symptoms. MAKE SURE YOU:   Understand these instructions.  Will watch your condition.  Will get help right away if you are not doing well or get worse. Document Released: 09/22/2005 Document Revised: 12/15/2011 Document Reviewed: 10/08/2011 [facility] Patient Information 2015 [employer] [place]. This information is not intended to replace advice given to you by your health care provider. Make sure you discuss any questions you have with your health care provider.

## 2014-12-25 NOTE — ED Notes (Signed)
Wife and pt concerned regarding pt's "sleep apnea" -- wife called primary care dr, who spoke with Lemont Fillersatyana, [place] regarding care. Explained that pulse ox and monitor were on, and being watched continuously. Pt does have short periods of apnea and shallow breathing, returns to normal resp in seconds.

## 2014-12-25 NOTE — ED Notes (Signed)
Pt c/o headache that started lst week-- seen here for same-- no relief, states has gotten worse. Pt also states has sleep apnea-- not treated, not sleeping good. Has not taken BP med this morning. Has taken benadryl, Excedrin migraine, and Rx med this morning without relief.

## 2014-12-25 NOTE — ED Provider Notes (Signed)
CSN: [id number]     Arrival date & time 12/25/14  0806 History   First MD Initiated Contact with Patient 12/25/14 [phone]     Chief Complaint  Patient presents with  . Migraine  . Nausea     (Consider location/radiation/quality/duration/timing/severity/associated sxs/prior Treatment) HPI William [name] is a 59 y.o. male who presents to ED with complaint of a headache and nausea. Patient states he has had headache for over a week. Was seen twice for the same in the last week. He states headache is frontal, behind the eyes, radiates around the head. He reports associated nausea or vomiting. Reports associated photophobia. He does state he has history of migraines, states these headaches feel similar to his prior migraines except for more severe. He has been taking Benadryl, Compazine, Excedrin Migraine at home with no improvement. He denies any visual changes. Denies neck pain or stiffness. No fevers. His wife is concerned that he may have sleep apnea, she states he stops breathing at night. Patient states that his headaches are usually worse in the morning and improves throughout the day. He denies any head injuries. Pt did not take any medications this morning. Took compazine and benadryl during the night.    Past Medical History  Diagnosis Date  . Hypertension   . Diabetes mellitus without complication   . Chronic hepatitis C without hepatic coma   . History of cocaine use   . Depression   . Arthritis   . History of TIA (transient ischemic attack)   . Hyperlipidemia   . History of migraine headaches   . Obesity, Class II, BMI 35-39.9    Past Surgical History  Procedure Laterality Date  . Rotator  cuff surgery       left shoulder    Family History  Problem Relation Age of Onset  . Hypertension Mother   . Diabetes Mother   . Heart disease Mother   . Thyroid disease Mother   . Hypertension Father   . Diabetes Father   . Heart disease Father    History  Substance Use Topics  .  Smoking status: Former Smoker -- 30 years    Types: Cigarettes    Quit date: 09/27/2014  . Smokeless tobacco: Not on file  . Alcohol Use: Yes     Comment: occasional alcohol use    Review of Systems  Constitutional: Negative for fever and chills.  Eyes: Positive for photophobia. Negative for visual disturbance.  Respiratory: Negative for cough, chest tightness and shortness of breath.   Cardiovascular: Negative for chest pain, palpitations and leg swelling.  Gastrointestinal: Positive for nausea and vomiting. Negative for abdominal pain, diarrhea and abdominal distention.  Genitourinary: Negative for dysuria, urgency, frequency and hematuria.  Musculoskeletal: Negative for myalgias, arthralgias, neck pain and neck stiffness.  Skin: Negative for rash.  Allergic/Immunologic: Negative for immunocompromised state.  Neurological: Positive for headaches. Negative for dizziness, weakness, light-headedness and numbness.      Allergies  Other  Home Medications   Prior to Admission medications   Medication Sig Start Date End Date Taking? Authorizing Provider  glucose blood test strip Use as instructed 12/02/13  Yes Deepak Advani, MD  Insulin Pen Needle (EASY TOUCH PEN NEEDLES) 32G X 6 MM MISC Use with Flexpen to administer Insulin 08/04/14  Yes Radhika P Phadke, MD  Lancets (FREESTYLE) lancets Use as instructed 12/02/13  Yes Deepak Advani, MD  amLODipine (NORVASC) 10 MG tablet Take 1 tablet (10 mg total) by mouth daily. 12/02/13  Doris Cheadle, MD  diphenhydrAMINE (BENADRYL) 25 MG tablet Take 1 tablet (25 mg total) by mouth every 6 (six) hours. As needed with Compazine for headache. 10/27/14   Arby Barrette, MD  insulin aspart (NOVOLOG FLEXPEN) 100 UNIT/ML FlexPen Use on a sliding scale between 5-15 units with each meal. Max daily dose 50 units per day. 07/25/14   Quentin Cornwall, MD  Insulin Glargine (LANTUS) 100 UNIT/ML Solostar Pen Inject 42 Units into the skin at bedtime. 07/25/14    Quentin Cornwall, MD  metFORMIN (GLUCOPHAGE) 500 MG tablet Take 1,000 mg by mouth 2 (two) times daily with a meal.     Historical Provider, MD  metoCLOPramide (REGLAN) 10 MG tablet Take 1 tablet (10 mg total) by mouth 3 (three) times daily as needed for nausea (headache / nausea). 12/22/14   Garlon Hatchet, PA-C  naproxen (NAPROSYN) 500 MG tablet Take 1 tablet (500 mg total) by mouth 2 (two) times daily. 10/01/14   Tilden Fossa, MD  oxyCODONE-acetaminophen (PERCOCET/ROXICET) 5-325 MG per tablet Take 1 tablet by mouth every 12 (twelve) hours as needed for severe pain.    Historical Provider, MD  prochlorperazine (COMPAZINE) 10 MG tablet Take 1 tablet (10 mg total) by mouth 2 (two) times daily as needed (Headache). Take with Benadryl 25 mg. 10/27/14   Arby Barrette, MD   BP 168/115 mmHg  Pulse 79  Temp(Src) 98 F (36.7 C) (Oral)  Resp 16  SpO2 98% Physical Exam  Constitutional: He is oriented to person, place, and time. He appears well-developed and well-nourished. No distress.  HENT:  Head: Normocephalic and atraumatic.  Eyes: Conjunctivae and EOM are normal. Pupils are equal, round, and reactive to light.  Neck: Normal range of motion. Neck supple.  Cardiovascular: Normal rate, regular rhythm and normal heart sounds.   Pulmonary/Chest: Effort normal. No respiratory distress. He has no wheezes. He has no rales.  Abdominal: Soft. Bowel sounds are normal. He exhibits no distension. There is no tenderness. There is no rebound.  Musculoskeletal: He exhibits no edema.  Neurological: He is alert and oriented to person, place, and time. No cranial nerve deficit. Coordination normal.  5/5 and equal upper and lower extremity strength bilaterally. Equal grip strength bilaterally. Normal finger to nose and heel to shin. No pronator drift.   Skin: Skin is warm and dry.  Nursing note and vitals reviewed.   ED Course  Procedures (including critical care time) Labs Review Labs Reviewed   COMPREHENSIVE METABOLIC PANEL - Abnormal; Notable for the following:    Sodium 133 (*)    Glucose, Bld 202 (*)    All other components within normal limits  URINALYSIS, ROUTINE W REFLEX MICROSCOPIC - Abnormal; Notable for the following:    Glucose, UA >1000 (*)    Ketones, ur 15 (*)    All other components within normal limits  CBC WITH DIFFERENTIAL/PLATELET  URINE RAPID DRUG SCREEN (HOSP PERFORMED)  URINE MICROSCOPIC-ADD ON    Imaging Review No results found.   EKG Interpretation None      MDM   Final diagnoses:  Nonintractable headache, unspecified chronicity pattern, unspecified headache type    Patient emergency department with recurrent headache. It sounds like his headaches are chronic for this patient. There has been a question about possible sleep apnea, wife states he stops breathing at night. Patient states headaches are worse in the morning. Gradually improved throughout the day. Headaches are gradual in onset. CT one month ago negative. No concern for intracerebral hemorrhage,  no concern for meningitis. We'll try to get pain under control.  Patient's wife actually called his primary care doctor while here in emergency department and got to speak with her. We will treat his pain today, he will follow up with her for further evaluation of this possible sleep apnea. She admitted that could be some financial issues with the patient at this time. I discussed this with patient, he states he will definitely follow-up and try to arrange sleep study. Patient did admit to me that he is homeless and his wife and him are staying at the shelter at this time. His headache improved with Toradol, Reglan, Benadryl, magnesium. They will be discharged home. He will call his doctor today for an appointment.  Filed Vitals:   12/25/14 0817 12/25/14 1038 12/25/14 1159  BP: 168/115  134/90  Pulse: 79 80 83  Temp: 98 F (36.7 C)  97.8 F (36.6 C)  TempSrc: Oral  Oral  Resp: 16 13 16    SpO2: 98% 94% 93%       Jaynie Crumbleatyana Kynsie Falkner, PA-C 12/25/14 1617  Azalia BilisKevin Campos, MD 12/26/14 [phone]

## 2015-03-08 ENCOUNTER — Encounter: Payer: Self-pay | Admitting: Emergency Medicine

## 2015-03-08 ENCOUNTER — Emergency Department
Admission: EM | Admit: 2015-03-08 | Discharge: 2015-03-08 | Disposition: A | Discharge status: Home / Self Care | Payer: Medicaid Other | Attending: Emergency Medicine | Admitting: Emergency Medicine

## 2015-03-08 DIAGNOSIS — Z720 Tobacco use: Secondary | ICD-10-CM | POA: Insufficient documentation

## 2015-03-08 DIAGNOSIS — E162 Hypoglycemia, unspecified: Secondary | ICD-10-CM

## 2015-03-08 DIAGNOSIS — E11649 Type 2 diabetes mellitus with hypoglycemia without coma: Secondary | ICD-10-CM | POA: Diagnosis present

## 2015-03-08 DIAGNOSIS — Z79899 Other long term (current) drug therapy: Secondary | ICD-10-CM | POA: Diagnosis not present

## 2015-03-08 DIAGNOSIS — E876 Hypokalemia: Secondary | ICD-10-CM | POA: Insufficient documentation

## 2015-03-08 DIAGNOSIS — I10 Essential (primary) hypertension: Secondary | ICD-10-CM | POA: Diagnosis not present

## 2015-03-08 DIAGNOSIS — Z794 Long term (current) use of insulin: Secondary | ICD-10-CM | POA: Insufficient documentation

## 2015-03-08 LAB — COMPREHENSIVE METABOLIC PANEL
ALBUMIN: 4.7 g/dL (ref 3.5–5.0)
ALK PHOS: 77 U/L (ref 38–126)
ALT: 124 U/L — ABNORMAL HIGH (ref 17–63)
ANION GAP: 17 — AB (ref 5–15)
AST: 69 U/L — AB (ref 15–41)
BILIRUBIN TOTAL: 0.5 mg/dL (ref 0.3–1.2)
BUN: 21 mg/dL — ABNORMAL HIGH (ref 6–20)
CHLORIDE: 102 mmol/L (ref 101–111)
CO2: 22 mmol/L (ref 22–32)
Calcium: 10 mg/dL (ref 8.9–10.3)
Creatinine, Ser: 1.39 mg/dL — ABNORMAL HIGH (ref 0.61–1.24)
GFR calc Af Amer: >60 mL/min (ref >60)
GFR, EST NON AFRICAN AMERICAN: 54 mL/min — AB (ref >60)
Glucose, Bld: 90 mg/dL (ref 65–99)
Potassium: 3.1 mmol/L — ABNORMAL LOW (ref 3.5–5.1)
SODIUM: 141 mmol/L (ref 135–145)
TOTAL PROTEIN: 9 g/dL — AB (ref 6.5–8.1)

## 2015-03-08 LAB — URINALYSIS COMPLETE WITH MICROSCOPIC (ARMC ONLY)
BACTERIA UA: NONE SEEN
Bilirubin Urine: NEGATIVE
Glucose, UA: >500 mg/dL — AB
HGB URINE DIPSTICK: NEGATIVE
KETONES UR: NEGATIVE mg/dL
Leukocytes, UA: NEGATIVE
Nitrite: NEGATIVE
PH: 5 (ref 5.0–8.0)
Protein, ur: NEGATIVE mg/dL
SPECIFIC GRAVITY, URINE: 1.028 (ref 1.005–1.030)

## 2015-03-08 LAB — CBC
HCT: 47 % (ref 40.0–52.0)
HEMOGLOBIN: 15.8 g/dL (ref 13.0–18.0)
MCH: 30 pg (ref 26.0–34.0)
MCHC: 33.6 g/dL (ref 32.0–36.0)
MCV: 89.4 fL (ref 80.0–100.0)
PLATELETS: 174 10*3/uL (ref 150–440)
RBC: 5.26 MIL/uL (ref 4.40–5.90)
RDW: 14.5 % (ref 11.5–14.5)
WBC: 9.4 10*3/uL (ref 3.8–10.6)

## 2015-03-08 LAB — GLUCOSE, CAPILLARY
Glucose-Capillary: 118 mg/dL — ABNORMAL HIGH (ref 65–99)
Glucose-Capillary: 85 mg/dL (ref 65–99)

## 2015-03-08 LAB — TROPONIN I: Troponin I: <0.03 ng/mL (ref <0.031)

## 2015-03-08 LAB — CBG MONITORING, ED: Glucose, Fingerstick: 85

## 2015-03-08 MED ORDER — ACETAMINOPHEN 500 MG PO TABS
1000.0000 mg | ORAL_TABLET | Freq: Once | ORAL | Status: AC
  Administered 2015-03-08: 1000 mg via ORAL

## 2015-03-08 MED ORDER — POTASSIUM CHLORIDE CRYS ER 20 MEQ PO TBCR
EXTENDED_RELEASE_TABLET | ORAL | Status: AC
  Administered 2015-03-08: 40 meq via ORAL
  Filled 2015-03-08: qty 2

## 2015-03-08 MED ORDER — ACETAMINOPHEN 500 MG PO TABS
ORAL_TABLET | ORAL | Status: AC
  Administered 2015-03-08: 1000 mg via ORAL
  Filled 2015-03-08: qty 2

## 2015-03-08 MED ORDER — POTASSIUM CHLORIDE CRYS ER 20 MEQ PO TBCR
40.0000 meq | EXTENDED_RELEASE_TABLET | Freq: Once | ORAL | Status: AC
  Administered 2015-03-08: 40 meq via ORAL

## 2015-03-08 MED ORDER — ONDANSETRON HCL 4 MG/2ML IJ SOLN
INTRAMUSCULAR | Status: AC
  Administered 2015-03-08: 4 mg via INTRAVENOUS
  Filled 2015-03-08: qty 2

## 2015-03-08 MED ORDER — ONDANSETRON HCL 4 MG/2ML IJ SOLN
4.0000 mg | Freq: Once | INTRAMUSCULAR | Status: AC
  Administered 2015-03-08: 4 mg via INTRAVENOUS

## 2015-03-08 NOTE — ED Provider Notes (Signed)
[facility] Emergency Department Provider Note  ____________________________________________  Time seen: Approximately 12:41 AM  I have reviewed the triage vital signs and the nursing notes.   HISTORY  Chief Complaint Hypoglycemia    HPI William [name] is a 59 y.o. male who presents via EMS for feeling like his blood sugar was low. Patient did not check it prior to arrival; however, states he felt shaky,nauseated and clammy. EMS reports blood sugar was 89. Patient recently ran out of his Lantus for 1 week. Restarted Lantus today but did not eat as much as usual due to running errands. Currently denies symptoms and states he is already feeling better. No complaints of pain. Denies fever, chills, chest pain, shortness of breath, abdominal pain, vomiting, diarrhea, headache, weakness, numbness, tingling.   Past Medical History  Diagnosis Date  . Hypertension   . Diabetes mellitus without complication   . Chronic hepatitis C without hepatic coma   . History of cocaine use   . Depression   . Arthritis   . History of TIA (transient ischemic attack)   . Hyperlipidemia   . History of migraine headaches   . Obesity, Class II, BMI 35-39.9     Patient Active Problem List   Diagnosis Date Noted  . Knee pain 07/12/2014  . Balanitis 07/12/2014  . History of cocaine use 07/12/2014  . Depression 07/12/2014  . Arthritis 07/12/2014  . Hx-TIA (transient ischemic attack) 07/12/2014  . Hx of migraine headaches 07/12/2014  . Obesity 07/12/2014  . Chronic hepatitis C 07/11/2014  . Hyperlipidemia 07/11/2014  . Diabetes 12/02/2013  . Essential hypertension, benign 12/02/2013    Past Surgical History  Procedure Laterality Date  . Rotator  cuff surgery       left shoulder     Current Outpatient Rx  Name  Route  Sig  Dispense  Refill  . amLODipine (NORVASC) 10 MG tablet   Oral   Take 1 tablet (10 mg total) by mouth daily.   30 tablet   4   . diphenhydrAMINE  (BENADRYL) 25 MG tablet   Oral   Take 1 tablet (25 mg total) by mouth every 6 (six) hours. As needed with Compazine for headache.   20 tablet   0   . glucose blood test strip      Use as instructed   100 each   12   . insulin aspart (NOVOLOG FLEXPEN) 100 UNIT/ML FlexPen      Use on a sliding scale between 5-15 units with each meal. Max daily dose 50 units per day.   15 mL   11   . Insulin Glargine (LANTUS) 100 UNIT/ML Solostar Pen   Subcutaneous   Inject 42 Units into the skin at bedtime.   15 mL   11   . Insulin Pen Needle (EASY TOUCH PEN NEEDLES) 32G X 6 MM MISC      Use with Flexpen to administer Insulin   100 each   11   . Lancets (FREESTYLE) lancets      Use as instructed   100 each   12   . metFORMIN (GLUCOPHAGE) 500 MG tablet   Oral   Take 1,000 mg by mouth 2 (two) times daily with a meal.          . metoCLOPramide (REGLAN) 10 MG tablet   Oral   Take 1 tablet (10 mg total) by mouth 3 (three) times daily as needed for nausea (headache / nausea).   20  tablet   0   . naproxen (NAPROSYN) 500 MG tablet   Oral   Take 1 tablet (500 mg total) by mouth 2 (two) times daily. Patient not taking: Reported on 12/25/2014   10 tablet   0   . nystatin ointment (MYCOSTATIN)   Topical   Apply 1 application topically daily as needed (affected area).       0   . oxyCODONE-acetaminophen (PERCOCET/ROXICET) 5-325 MG per tablet   Oral   Take 1 tablet by mouth every 12 (twelve) hours as needed for severe pain.         Marland Kitchen prochlorperazine (COMPAZINE) 10 MG tablet   Oral   Take 1 tablet (10 mg total) by mouth 2 (two) times daily as needed (Headache). Take with Benadryl 25 mg.   15 tablet   0   . traZODone (DESYREL) 100 MG tablet   Oral   Take 100 mg by mouth at bedtime as needed for sleep.            Allergies Other  Family History  Problem Relation Age of Onset  . Hypertension Mother   . Diabetes Mother   . Heart disease Mother   . Thyroid disease  Mother   . Hypertension Father   . Diabetes Father   . Heart disease Father     Social History History  Substance Use Topics  . Smoking status: Current Every Day Smoker -- 30 years    Types: Cigarettes    Last Attempt to Quit: 09/27/2014  . Smokeless tobacco: Not on file  . Alcohol Use: Yes     Comment: occasional alcohol use    Review of Systems Constitutional: No fever/chills Eyes: No visual changes. ENT: No sore throat. Cardiovascular: Denies chest pain. Respiratory: Denies shortness of breath. Gastrointestinal: No abdominal pain.  Positive for nausea, no vomiting.  No diarrhea.  No constipation. Genitourinary: Negative for dysuria. Musculoskeletal: Negative for back pain. Skin: Negative for rash. Neurological: Negative for headaches, focal weakness or numbness.  10-point ROS otherwise negative.  ____________________________________________   PHYSICAL EXAM:  VITAL SIGNS: ED Triage Vitals  Enc Vitals Group     BP 03/08/15 0013 101/77 mmHg     Pulse Rate 03/08/15 0013 78     Resp 03/08/15 0013 20     Temp 03/08/15 0013 98.2 F (36.8 C)     Temp Source 03/08/15 0013 Oral     SpO2 03/08/15 0013 95 %     Weight 03/08/15 0013 226 lb (102.513 kg)     Height 03/08/15 0013  (1.803 m)     Head Cir --      Peak Flow --      Pain Score --      Pain Loc --      Pain Edu? --      Excl. in GC? --     Constitutional: Alert and oriented. Well appearing and in no acute distress. Eyes: Conjunctivae are normal. PERRL. EOMI. Head: Atraumatic. Nose: No congestion/rhinnorhea. Mouth/Throat: Mucous membranes are moist.  Oropharynx non-erythematous. Neck: No stridor.   Cardiovascular: Normal rate, regular rhythm. Grossly normal heart sounds.  Good peripheral circulation. Respiratory: Normal respiratory effort.  No retractions. Lungs CTAB. Gastrointestinal: Soft and nontender. No distention. No abdominal bruits. No CVA tenderness. Musculoskeletal: No lower extremity  tenderness nor edema.  No joint effusions. Neurologic:  Normal speech and language. No gross focal neurologic deficits are appreciated. Speech is normal. No gait instability. Skin:  Skin is warm,  dry and intact. No rash noted. Psychiatric: Mood and affect are normal. Speech and behavior are normal.  ____________________________________________   LABS (all labs ordered are listed, but only abnormal results are displayed)  Labs Reviewed  COMPREHENSIVE METABOLIC PANEL - Abnormal; Notable for the following:    Potassium 3.1 (*)    BUN 21 (*)    Creatinine, Ser 1.39 (*)    Total Protein 9.0 (*)    AST 69 (*)    ALT 124 (*)    GFR calc non Af Amer 54 (*)    Anion gap 17 (*)    All other components within normal limits  URINALYSIS COMPLETEWITH MICROSCOPIC ([facility] ONLY) - Abnormal; Notable for the following:    Color, Urine YELLOW (*)    APPearance CLEAR (*)    Glucose, UA >500 (*)    Squamous Epithelial / LPF 0-5 (*)    All other components within normal limits  GLUCOSE, CAPILLARY - Abnormal; Notable for the following:    Glucose-Capillary 118 (*)    All other components within normal limits  CBC  GLUCOSE, CAPILLARY  TROPONIN I  CBG MONITORING, ED  CBG MONITORING, ED   ____________________________________________  EKG  ED ECG REPORT I, SUNG,JADE J, the attending physician, personally viewed and interpreted this ECG.   Date: 03/08/2015  EKG Time: 0019  Rate: 75  Rhythm: normal EKG, normal sinus rhythm  Axis: Normal  Intervals:right bundle branch block  ST&T Change: Nonspecific  ____________________________________________  RADIOLOGY  None ____________________________________________   PROCEDURES  Procedure(s) performed: None  Critical Care performed: No  ____________________________________________   INITIAL IMPRESSION / ASSESSMENT AND PLAN / ED COURSE  Pertinent labs & imaging results that were available during my care of the patient were reviewed by me  and considered in my medical decision making (see chart for details).  59 year old male who presents with feeling hypoglycemic. Blood sugar on arrival 85. Symptoms likely secondary to restarting Lantus without eating properly. Will check basic labs and urinalysis. Sandwich tray and orange juice given to patient.  ----------------------------------------- 3:13 AM on 03/08/2015 -----------------------------------------  Patient resting in no acute distress. Blood sugar 118. Labs notable for mild hypokalemia, mild dehydration, mild elevation of anion gap without ketones on urinalysis. Will replete potassium in the ED. Patient cautioned against taking insulin without eating. Strict return precautions given. Patient and spouse verbalized understanding and agree with plan of care. ____________________________________________   FINAL CLINICAL IMPRESSION(S) / ED DIAGNOSES  Final diagnoses:  Hypoglycemia  Hypokalemia      Irean Hong, MD 03/08/15 [id number]

## 2015-03-08 NOTE — Discharge Instructions (Signed)
1. Eat frequent, small meals daily. Do not give your insulin if you have not eaten. 2. Return to the ER for worsening symptoms, persistent vomiting, fever, difficulty breathing or other concerns.  Hypoglycemia Hypoglycemia occurs when the glucose in your blood is too low. Glucose is a type of sugar that is your body's main energy source. Hormones, such as insulin and glucagon, control the level of glucose in the blood. Insulin lowers blood glucose and glucagon increases blood glucose. Having too much insulin in your blood stream, or not eating enough food containing sugar, can result in hypoglycemia. Hypoglycemia can happen to people with or without diabetes. It can develop quickly and can be a medical emergency.  CAUSES   Missing or delaying meals.  Not eating enough carbohydrates at meals.  Taking too much diabetes medicine.  Not timing your oral diabetes medicine or insulin doses with meals, snacks, and exercise.  Nausea and vomiting.  Certain medicines.  Severe illnesses, such as hepatitis, kidney disorders, and certain eating disorders.  Increased activity or exercise without eating something extra or adjusting medicines.  Drinking too much alcohol.  A nerve disorder that affects body functions like your heart rate, blood pressure, and digestion (autonomic neuropathy).  A condition where the stomach muscles do not function properly (gastroparesis). Therefore, medicines and food may not absorb properly.  Rarely, a tumor of the pancreas can produce too much insulin. SYMPTOMS   Hunger.  Sweating (diaphoresis).  Change in body temperature.  Shakiness.  Headache.  Anxiety.  Lightheadedness.  Irritability.  Difficulty concentrating.  Dry mouth.  Tingling or numbness in the hands or feet.  Restless sleep or sleep disturbances.  Altered speech and coordination.  Change in mental status.  Seizures or prolonged convulsions.  Combativeness.  Drowsiness  (lethargic).  Weakness.  Increased heart rate or palpitations.  Confusion.  Pale, gray skin color.  Blurred or double vision.  Fainting. DIAGNOSIS  A physical exam and medical history will be performed. Your caregiver may make a diagnosis based on your symptoms. Blood tests and other lab tests may be performed to confirm a diagnosis. Once the diagnosis is made, your caregiver will see if your signs and symptoms go away once your blood glucose is raised.  TREATMENT  Usually, you can easily treat your hypoglycemia when you notice symptoms.  Check your blood glucose. If it is less than 70 mg/dl, take one of the following:   3-4 glucose tablets.    cup juice.    cup regular soda.   1 cup skim milk.   -1 tube of glucose gel.   5-6 hard candies.   Avoid high-fat drinks or food that may delay a rise in blood glucose levels.  Do not take more than the recommended amount of sugary foods, drinks, gel, or tablets. Doing so will cause your blood glucose to go too high.   Wait 10-15 minutes and recheck your blood glucose. If it is still less than 70 mg/dl or below your target range, repeat treatment.   Eat a snack if it is more than 1 hour until your next meal.  There may be a time when your blood glucose may go so low that you are unable to treat yourself at home when you start to notice symptoms. You may need someone to help you. You may even faint or be unable to swallow. If you cannot treat yourself, someone will need to bring you to the hospital.  HOME CARE INSTRUCTIONS  If you have diabetes,  follow your diabetes management plan by:  Taking your medicines as directed.  Following your exercise plan.  Following your meal plan. Do not skip meals. Eat on time.  Testing your blood glucose regularly. Check your blood glucose before and after exercise. If you exercise longer or different than usual, be sure to check blood glucose more frequently.  Wearing your  medical alert jewelry that says you have diabetes.  Identify the cause of your hypoglycemia. Then, develop ways to prevent the recurrence of hypoglycemia.  Do not take a hot bath or shower right after an insulin shot.  Always carry treatment with you. Glucose tablets are the easiest to carry.  If you are going to drink alcohol, drink it only with meals.  Tell friends or family members ways to keep you safe during a seizure. This may include removing hard or sharp objects from the area or turning you on your side.  Maintain a healthy weight. SEEK MEDICAL CARE IF:   You are having problems keeping your blood glucose in your target range.  You are having frequent episodes of hypoglycemia.  You feel you might be having side effects from your medicines.  You are not sure why your blood glucose is dropping so low.  You notice a change in vision or a new problem with your vision. SEEK IMMEDIATE MEDICAL CARE IF:   Confusion develops.  A change in mental status occurs.  The inability to swallow develops.  Fainting occurs. Document Released: 09/22/2005 Document Revised: 09/27/2013 Document Reviewed: 01/19/2012 [facility] Patient Information 2015 [place], [place]. This information is not intended to replace advice given to you by your health care provider. Make sure you discuss any questions you have with your health care provider.  Hypokalemia Hypokalemia means that the amount of potassium in the blood is lower than normal.Potassium is a chemical, called an electrolyte, that helps regulate the amount of fluid in the body. It also stimulates muscle contraction and helps nerves function properly.Most of the body's potassium is inside of cells, and only a very small amount is in the blood. Because the amount in the blood is so small, minor changes can be life-threatening. CAUSES  Antibiotics.  Diarrhea or vomiting.  Using laxatives too much, which can cause diarrhea.  Chronic kidney  disease.  Water pills (diuretics).  Eating disorders (bulimia).  Low magnesium level.  Sweating a lot. SIGNS AND SYMPTOMS  Weakness.  Constipation.  Fatigue.  Muscle cramps.  Mental confusion.  Skipped heartbeats or irregular heartbeat (palpitations).  Tingling or numbness. DIAGNOSIS  Your health care provider can diagnose hypokalemia with blood tests. In addition to checking your potassium level, your health care provider may also check other lab tests. TREATMENT Hypokalemia can be treated with potassium supplements taken by mouth or adjustments in your current medicines. If your potassium level is very low, you may need to get potassium through a vein (IV) and be monitored in the hospital. A diet high in potassium is also helpful. Foods high in potassium are:  Nuts, such as peanuts and pistachios.  Seeds, such as sunflower seeds and pumpkin seeds.  Peas, lentils, and lima beans.  Whole grain and bran cereals and breads.  Fresh fruit and vegetables, such as apricots, avocado, bananas, cantaloupe, kiwi, oranges, tomatoes, asparagus, and potatoes.  Orange and tomato juices.  Red meats.  Fruit yogurt. HOME CARE INSTRUCTIONS  Take all medicines as prescribed by your health care provider.  Maintain a healthy diet by including nutritious food, such as fruits,  vegetables, nuts, whole grains, and lean meats.  If you are taking a laxative, be sure to follow the directions on the label. SEEK MEDICAL CARE IF:  Your weakness gets worse.  You feel your heart pounding or racing.  You are vomiting or having diarrhea.  You are diabetic and having trouble keeping your blood glucose in the normal range. SEEK IMMEDIATE MEDICAL CARE IF:  You have chest pain, shortness of breath, or dizziness.  You are vomiting or having diarrhea for more than 2 days.  You faint. MAKE SURE YOU:   Understand these instructions.  Will watch your condition.  Will get help right away  if you are not doing well or get worse. Document Released: 09/22/2005 Document Revised: 07/13/2013 Document Reviewed: 03/25/2013 [facility] Patient Information 2015 [place], [place]. This information is not intended to replace advice given to you by your health care provider. Make sure you discuss any questions you have with your health care provider.

## 2015-03-08 NOTE — ED Notes (Signed)
Pt reported nausea, meds given per MD order. Meal tray provided.

## 2015-03-08 NOTE — ED Notes (Signed)
Pt reports he felt like his BGL was low, did not check it. EMS reports it was 89. Pt alert and oriented.

## 2015-03-08 NOTE — ED Notes (Signed)

## 2015-03-09 ENCOUNTER — Telehealth: Payer: Self-pay | Admitting: Family Medicine

## 2015-03-09 NOTE — Telephone Encounter (Signed)
Pt informed

## 2015-03-09 NOTE — Telephone Encounter (Signed)
Novolog Insulin Sliding Scale printed out and ready for patient to pick up.

## 2015-03-09 NOTE — Telephone Encounter (Signed)
Pt is requesting the sheet for his sliding scale for his insulin. Would like to pick this up today.

## 2015-03-20 ENCOUNTER — Other Ambulatory Visit: Payer: Self-pay | Admitting: Family Medicine

## 2015-03-20 DIAGNOSIS — R972 Elevated prostate specific antigen [PSA]: Secondary | ICD-10-CM

## 2015-04-05 ENCOUNTER — Other Ambulatory Visit: Payer: Self-pay | Admitting: Family Medicine

## 2015-04-05 DIAGNOSIS — E1165 Type 2 diabetes mellitus with hyperglycemia: Secondary | ICD-10-CM

## 2015-04-05 DIAGNOSIS — E1142 Type 2 diabetes mellitus with diabetic polyneuropathy: Secondary | ICD-10-CM

## 2015-04-05 DIAGNOSIS — I10 Essential (primary) hypertension: Secondary | ICD-10-CM

## 2015-04-05 DIAGNOSIS — IMO0002 Reserved for concepts with insufficient information to code with codable children: Secondary | ICD-10-CM

## 2015-04-05 DIAGNOSIS — N481 Balanitis: Secondary | ICD-10-CM

## 2015-04-05 MED ORDER — METFORMIN HCL 1000 MG PO TABS: 1000.0000 mg | ORAL_TABLET | Freq: Two times a day (BID) | ORAL | Status: DC

## 2015-04-05 MED ORDER — AMLODIPINE BESYLATE 10 MG PO TABS: 10.0000 mg | ORAL_TABLET | Freq: Every day | ORAL | Status: DC

## 2015-04-05 MED ORDER — FLUCONAZOLE 150 MG PO TABS
150.0000 mg | ORAL_TABLET | Freq: Once | ORAL | Status: DC
Start: 2015-04-05 — End: 2015-05-01

## 2015-04-05 NOTE — Telephone Encounter (Signed)
Pt needs refill on his meds, metformin, amlodipine, and fluconazole. He uses the [facility] on [address] in [place].

## 2015-04-05 NOTE — Telephone Encounter (Signed)
Refill request was sent to Dr. Ashany Sundaram for approval and submission.  

## 2015-04-10 ENCOUNTER — Other Ambulatory Visit: Payer: Self-pay

## 2015-04-10 DIAGNOSIS — R972 Elevated prostate specific antigen [PSA]: Secondary | ICD-10-CM

## 2015-04-11 ENCOUNTER — Telehealth: Payer: Self-pay

## 2015-04-11 DIAGNOSIS — R972 Elevated prostate specific antigen [PSA]: Secondary | ICD-10-CM

## 2015-04-11 LAB — PSA: Prostate Specific Ag, Serum: 2.3 ng/mL (ref 0.0–4.0)

## 2015-04-11 NOTE — Telephone Encounter (Signed)
-----   Message from Harle BattiestShannon A McGowan, PA-C sent at 04/11/2015  8:12 AM EDT ----- Patient's PSA has returned to baseline.  I would like to see him in 6 months.  PSA to be drawn one week before appointment.

## 2015-04-11 NOTE — Telephone Encounter (Signed)
Spoke with pt and made aware of lab results. Pt voiced understanding. PSA orders are in epic. Cw,lpn

## 2015-05-01 ENCOUNTER — Other Ambulatory Visit: Payer: Self-pay | Admitting: Family Medicine

## 2015-05-01 DIAGNOSIS — N481 Balanitis: Secondary | ICD-10-CM

## 2015-05-08 ENCOUNTER — Encounter (HOSPITAL_COMMUNITY): Payer: Self-pay | Admitting: Physical Medicine and Rehabilitation

## 2015-05-08 ENCOUNTER — Emergency Department (HOSPITAL_COMMUNITY)
Admission: EM | Admit: 2015-05-08 | Discharge: 2015-05-08 | Disposition: A | Discharge status: Home / Self Care | Payer: Medicaid Other | Attending: Emergency Medicine | Admitting: Emergency Medicine

## 2015-05-08 ENCOUNTER — Emergency Department (HOSPITAL_BASED_OUTPATIENT_CLINIC_OR_DEPARTMENT_OTHER)
Admit: 2015-05-08 | Discharge: 2015-05-08 | Disposition: A | Discharge status: Home / Self Care | Payer: Medicaid Other | Attending: Emergency Medicine | Admitting: Emergency Medicine

## 2015-05-08 DIAGNOSIS — E119 Type 2 diabetes mellitus without complications: Secondary | ICD-10-CM | POA: Diagnosis not present

## 2015-05-08 DIAGNOSIS — Z79899 Other long term (current) drug therapy: Secondary | ICD-10-CM | POA: Diagnosis not present

## 2015-05-08 DIAGNOSIS — Z720 Tobacco use: Secondary | ICD-10-CM | POA: Insufficient documentation

## 2015-05-08 DIAGNOSIS — M79621 Pain in right upper arm: Secondary | ICD-10-CM | POA: Diagnosis present

## 2015-05-08 DIAGNOSIS — Z794 Long term (current) use of insulin: Secondary | ICD-10-CM | POA: Diagnosis not present

## 2015-05-08 DIAGNOSIS — E669 Obesity, unspecified: Secondary | ICD-10-CM | POA: Diagnosis not present

## 2015-05-08 DIAGNOSIS — M1990 Unspecified osteoarthritis, unspecified site: Secondary | ICD-10-CM | POA: Insufficient documentation

## 2015-05-08 DIAGNOSIS — E785 Hyperlipidemia, unspecified: Secondary | ICD-10-CM | POA: Insufficient documentation

## 2015-05-08 DIAGNOSIS — Z8673 Personal history of transient ischemic attack (TIA), and cerebral infarction without residual deficits: Secondary | ICD-10-CM | POA: Diagnosis not present

## 2015-05-08 DIAGNOSIS — Z8619 Personal history of other infectious and parasitic diseases: Secondary | ICD-10-CM | POA: Insufficient documentation

## 2015-05-08 DIAGNOSIS — F329 Major depressive disorder, single episode, unspecified: Secondary | ICD-10-CM | POA: Diagnosis not present

## 2015-05-08 DIAGNOSIS — M79609 Pain in unspecified limb: Secondary | ICD-10-CM

## 2015-05-08 DIAGNOSIS — M7989 Other specified soft tissue disorders: Secondary | ICD-10-CM | POA: Diagnosis not present

## 2015-05-08 DIAGNOSIS — I10 Essential (primary) hypertension: Secondary | ICD-10-CM | POA: Insufficient documentation

## 2015-05-08 MED ORDER — IBUPROFEN 200 MG PO TABS
600.0000 mg | ORAL_TABLET | Freq: Once | ORAL | Status: AC
  Administered 2015-05-08: 600 mg via ORAL
  Filled 2015-05-08: qty 3

## 2015-05-08 NOTE — ED Notes (Signed)
Pt reports R arm pain x3 weeks. Concerned this could be blood clot. No swelling/bruising noted upon arrival to ED.

## 2015-05-08 NOTE — Progress Notes (Addendum)
*  PRELIMINARY RESULTS* Vascular Ultrasound RUE venous has been completed.  Preliminary findings:  No evidence of DVT or superficial thrombosis.     Farrel Demark, RDMS, RVT  05/08/2015, 8:31 AM

## 2015-05-08 NOTE — ED Notes (Signed)
Pt states R arm pain for several weeks, no swelling noted at the time. CMS intact. Denies recent injury.

## 2015-05-08 NOTE — ED Notes (Signed)
Patient transported to vascular. 

## 2015-05-08 NOTE — ED Provider Notes (Signed)
CSN: [id number]     Arrival date & time 05/08/15  0714 History   First MD Initiated Contact with Patient 05/08/15 [phone]     Chief Complaint  Patient presents with  . Arm Pain     (Consider location/radiation/quality/duration/timing/severity/associated sxs/prior Treatment) HPI  59 year old male presents with right arm pain near his elbow. He states this was severe when he first awoke this morning. He's been having pain on and off like this for the past 1 week or so. Patient feels like his medial arm near the elbow is swollen compared to the left. He has pain with flexion of his elbow. Has not had a pain in the bones or any joint swelling. No chest pain or shortness of breath. No weakness, numbness, or tingling. No direct trauma. No prior history of blood clots. Patient's wife was concerned about a blood clot and sent him here for this evaluation.  Past Medical History  Diagnosis Date  . Hypertension   . Diabetes mellitus without complication   . Chronic hepatitis C without hepatic coma   . History of cocaine use   . Depression   . Arthritis   . History of TIA (transient ischemic attack)   . Hyperlipidemia   . History of migraine headaches   . Obesity, Class II, BMI 35-39.9    Past Surgical History  Procedure Laterality Date  . Rotator  cuff surgery       left shoulder    Family History  Problem Relation Age of Onset  . Hypertension Mother   . Diabetes Mother   . Heart disease Mother   . Thyroid disease Mother   . Hypertension Father   . Diabetes Father   . Heart disease Father    History  Substance Use Topics  . Smoking status: Current Every Day Smoker -- 30 years    Types: Cigarettes    Last Attempt to Quit: 09/27/2014  . Smokeless tobacco: Not on file  . Alcohol Use: Yes    Review of Systems  Constitutional: Negative for fever.  Respiratory: Negative for shortness of breath.   Cardiovascular: Negative for chest pain.  Musculoskeletal: Positive for myalgias.  Negative for joint swelling.  Neurological: Negative for weakness and numbness.  All other systems reviewed and are negative.     Allergies  Other  Home Medications   Prior to Admission medications   Medication Sig Start Date End Date Taking? Authorizing Provider  amLODipine (NORVASC) 10 MG tablet Take 1 tablet (10 mg total) by mouth daily. 04/05/15   Edwena Felty, MD  diphenhydrAMINE (BENADRYL) 25 MG tablet Take 1 tablet (25 mg total) by mouth every 6 (six) hours. As needed with Compazine for headache. 10/27/14   Arby Barrette, MD  fluconazole (DIFLUCAN) 150 MG tablet TAKE 1 TABLET BY MOUTH ONCE 05/01/15   Edwena Felty, MD  glucose blood test strip Use as instructed 12/02/13   Doris Cheadle, MD  insulin aspart (NOVOLOG FLEXPEN) 100 UNIT/ML FlexPen Use on a sliding scale between 5-15 units with each meal. Max daily dose 50 units per day. 07/25/14   Quentin Cornwall, MD  Insulin Glargine (LANTUS) 100 UNIT/ML Solostar Pen Inject 42 Units into the skin at bedtime. 07/25/14   Quentin Cornwall, MD  Insulin Pen Needle (EASY TOUCH PEN NEEDLES) 32G X 6 MM MISC Use with Flexpen to administer Insulin 08/04/14   Radhika P Phadke, MD  Lancets (FREESTYLE) lancets Use as instructed 12/02/13   Doris Cheadle, MD  metFORMIN (GLUCOPHAGE) 1000  MG tablet Take 1 tablet (1,000 mg total) by mouth 2 (two) times daily with a meal. 04/05/15   Edwena Felty, MD  metoCLOPramide (REGLAN) 10 MG tablet Take 1 tablet (10 mg total) by mouth 3 (three) times daily as needed for nausea (headache / nausea). 12/22/14   Garlon Hatchet, PA-C  naproxen (NAPROSYN) 500 MG tablet Take 1 tablet (500 mg total) by mouth 2 (two) times daily. Patient not taking: Reported on 12/25/2014 10/01/14   Tilden Fossa, MD  nystatin ointment (MYCOSTATIN) Apply 1 application topically daily as needed (affected area).  10/10/14   Historical Provider, MD  oxyCODONE-acetaminophen (PERCOCET/ROXICET) 5-325 MG per tablet Take 1 tablet by mouth every 12  (twelve) hours as needed for severe pain.    Historical Provider, MD  prochlorperazine (COMPAZINE) 10 MG tablet Take 1 tablet (10 mg total) by mouth 2 (two) times daily as needed (Headache). Take with Benadryl 25 mg. 10/27/14   Arby Barrette, MD  traZODone (DESYREL) 100 MG tablet Take 100 mg by mouth at bedtime as needed for sleep.  09/06/14   Historical Provider, MD   BP 150/100 mmHg  Pulse 75  Temp(Src) 97.9 F (36.6 C) (Oral)  Resp 18  SpO2 99% Physical Exam  Constitutional: He is oriented to person, place, and time. He appears well-developed and well-nourished.  HENT:  Head: Normocephalic and atraumatic.  Right Ear: External ear normal.  Left Ear: External ear normal.  Nose: Nose normal.  Eyes: Right eye exhibits no discharge. Left eye exhibits no discharge.  Neck: Neck supple.  Cardiovascular: Normal rate, regular rhythm, normal heart sounds and intact distal pulses.   Pulses:      Radial pulses are 2+ on the right side, and 2+ on the left side.  Pulmonary/Chest: Effort normal and breath sounds normal.  Abdominal: He exhibits no distension.  Musculoskeletal:       Right elbow: He exhibits no swelling. Tenderness (over medial/anterior aspect. No bony tenderness) found.       Arms: Normal radial, ulnar and median nerve testing in RUE.  Neurological: He is alert and oriented to person, place, and time.  Skin: Skin is warm and dry. No erythema.  Nursing note and vitals reviewed.   ED Course  Procedures (including critical care time) Labs Review Labs Reviewed - No data to display  Imaging Review No results found.  *PRELIMINARY RESULTS* Vascular Ultrasound RUE venous has been completed. Preliminary findings: No evidence of DVT or superficial thrombosis.    Farrel Demark, RDMS, RVT 05/08/2015, 8:31 AM   EKG Interpretation None      MDM   Final diagnoses:  Pain of right upper arm    Ultrasound is negative for DVT. NV intact. Minimal to no swelling in the right  upper extremity. No bony tenderness or trauma to suggest needing an x-ray. No joint swelling to suggest effusion. Patient's pain is mostly over the bicep, could have bicep tendinitis versus a mild strain. Recommend RICE and follow-up with PCP.    Pricilla Loveless, MD 05/08/15 [phone]

## 2015-05-08 NOTE — ED Notes (Addendum)
EDP at bedside  

## 2015-05-29 ENCOUNTER — Ambulatory Visit: Payer: Self-pay | Admitting: Family Medicine

## 2015-05-30 ENCOUNTER — Ambulatory Visit (INDEPENDENT_AMBULATORY_CARE_PROVIDER_SITE_OTHER): Payer: Medicaid Other | Admitting: Family Medicine

## 2015-05-30 ENCOUNTER — Encounter: Payer: Self-pay | Admitting: Family Medicine

## 2015-05-30 ENCOUNTER — Other Ambulatory Visit: Payer: Self-pay

## 2015-05-30 VITALS — BP 126/86 | HR 96 | Temp 98.7°F | Resp 16 | Ht 68.5 in | Wt 229.5 lb

## 2015-05-30 DIAGNOSIS — E1159 Type 2 diabetes mellitus with other circulatory complications: Secondary | ICD-10-CM

## 2015-05-30 DIAGNOSIS — E785 Hyperlipidemia, unspecified: Secondary | ICD-10-CM

## 2015-05-30 DIAGNOSIS — B182 Chronic viral hepatitis C: Secondary | ICD-10-CM

## 2015-05-30 DIAGNOSIS — E8881 Metabolic syndrome: Secondary | ICD-10-CM | POA: Diagnosis not present

## 2015-05-30 DIAGNOSIS — M5416 Radiculopathy, lumbar region: Principal | ICD-10-CM

## 2015-05-30 DIAGNOSIS — G8929 Other chronic pain: Secondary | ICD-10-CM

## 2015-05-30 DIAGNOSIS — M2550 Pain in unspecified joint: Secondary | ICD-10-CM | POA: Diagnosis not present

## 2015-05-30 DIAGNOSIS — E1165 Type 2 diabetes mellitus with hyperglycemia: Principal | ICD-10-CM

## 2015-05-30 DIAGNOSIS — E1151 Type 2 diabetes mellitus with diabetic peripheral angiopathy without gangrene: Secondary | ICD-10-CM

## 2015-05-30 DIAGNOSIS — IMO0002 Reserved for concepts with insufficient information to code with codable children: Secondary | ICD-10-CM

## 2015-05-30 DIAGNOSIS — I10 Essential (primary) hypertension: Secondary | ICD-10-CM

## 2015-05-30 DIAGNOSIS — N481 Balanitis: Secondary | ICD-10-CM | POA: Diagnosis not present

## 2015-05-30 MED ORDER — CYCLOBENZAPRINE HCL 10 MG PO TABS: 10.0000 mg | ORAL_TABLET | Freq: Three times a day (TID) | ORAL | Status: DC | PRN

## 2015-05-30 MED ORDER — FREESTYLE LANCETS MISC: Status: AC

## 2015-05-30 MED ORDER — FLUCONAZOLE 150 MG PO TABS: 150.0000 mg | ORAL_TABLET | ORAL | Status: DC

## 2015-05-30 MED ORDER — NYSTATIN 100000 UNIT/GM EX OINT: 1.0000 "application " | TOPICAL_OINTMENT | Freq: Three times a day (TID) | CUTANEOUS | Status: DC

## 2015-05-30 MED ORDER — OXYCODONE-ACETAMINOPHEN 5-325 MG PO TABS: 1.0000 | ORAL_TABLET | Freq: Two times a day (BID) | ORAL | Status: DC | PRN

## 2015-05-30 MED ORDER — GLUCOSE BLOOD VI STRP: ORAL_STRIP | Status: AC

## 2015-05-30 MED ORDER — GABAPENTIN 100 MG PO CAPS: 100.0000 mg | ORAL_CAPSULE | Freq: Three times a day (TID) | ORAL | Status: DC

## 2015-05-30 NOTE — Telephone Encounter (Signed)
Patient was on this medication some time ago and wanted to know if he could get a rx to restart this as needed.

## 2015-05-30 NOTE — Progress Notes (Signed)
Name: William [name]   MRN: [id number]    DOB: July 30, 1956   Date:05/30/2015       Progress Note  Subjective  Chief Complaint  Chief Complaint  Patient presents with  . Follow-up    patient is here for his 68-monthdiabetes follow-up. highest has been 460 about a week ago. This morning is was 117. The lowest has been 576 patient is taking his medication as directed.  . Medication Refill    patient needs refills on his test strips, insulin, needles, Nystatin cream and his Diflucan. Patient wants to know if he could restart his Gabapentin.    HPI   Patient is here for routine follow up of Diabetes Type II.  First diagnosed with DM Type II several years ago. Current diabetes medication regimen includes Metformin 1000 mg twice a day, Lantus 42 units daily, Novolog sliding scale. Average novolog insulin used is 12-15 units patient reports. [name] taking medications as instructed. Overall the patient feels that their blood glucose is not well controlled. Checking blood glucose 3-4  times a day consistently and needs more testing supplies to do this. No symptoms of hypoglycemia are reported today but has had sugars as low as 58. Fasting glucose range 110 - 170. Meal time glucose range 250 - 400. Lowest glucose result 58.  Fluctuating glucose is due to non compliance with diet and lifestyle and disease progression.  Related symptoms include numbness and tingling in feet and arms but he is not sure if this is related to cervical spine issues. Related medical diagnosis include frequent balanitis, hx of TIA, HTN, HLD, Obesity, Metabolic Syndrome. The [facility]    Hyperlipidemia: Patient presents with hyperlipidemia.  He was tested because co morbid conditions such as DM II, HTN, Obesity. There is a family history of hyperlipidemia. There is a family history of early ischemia heart disease.  Patient is here for routine follow up of Hypertension. First diagnosed  with hypertension several years ago. Current anti-hypertension medication regimen includes dietary modification, weight management and amlodipine 10 mg one a day. Patient is following physician recommended management. Not checking blood pressure outside of physician office. Associated symptoms do not include headache, dizziness, nausea, lower extremity swelling, shortness of breath, chest pain.  Patient is here for routine follow up of Obesity. The patient is appropriately concerned about their weight. Onset of weight issues started many years ago. Highest recorded weight around 250 lbs.  He does not exercise regularly and diet is inconsistent.  Associated symptoms or diseases include pain in weight bearing joints, HTN, HLD, DMII, skin lesions, depression, poor self esteem, snoring, sleep apnea. Current efforts to reduce weight include low fat, low carbohydrate, low sugar diet along with daily exercise.  Patient is trying to be more consistent with recommended lifestyle changes.   Joint pain. Mr. LPratscomplains of chronic joint pain in his knees, lower back, right elbow and shoulder.  Pain is described at aching and throbbing.  Associated symptoms is reduced strength and range of motion.  Symptoms are constant but wax and wane in intensity.  Current medications used are percocet 5-325 mg sparingly and muscle relaxer as needed.  He would like to restart Gabapentin.   Past Medical History  Diagnosis Date  . Hypertension   . Diabetes mellitus without complication   . Chronic hepatitis C without hepatic coma   . History of cocaine use   . Depression   . Arthritis   .  History of TIA (transient ischemic attack)   . Hyperlipidemia   . History of migraine headaches   . Obesity, Class II, BMI 35-39.9     Past Surgical History  Procedure Laterality Date  . Rotator  cuff surgery       left shoulder     Family History  Problem Relation Age of Onset  . Hypertension Mother   . Diabetes Mother   .  Heart disease Mother   . Thyroid disease Mother   . Hypertension Father   . Diabetes Father   . Heart disease Father     Social History   Social History  . Marital Status: Divorced    Spouse Name: N/A  . Number of Children: N/A  . Years of Education: N/A   Occupational History  . Not on file.   Social History Main Topics  . Smoking status: Current Every Day Smoker -- 30 years    Types: Cigarettes    Last Attempt to Quit: 09/27/2014  . Smokeless tobacco: Not on file  . Alcohol Use: 0.0 oz/week    0 Standard drinks or equivalent per week  . Drug Use: No     Comment: history of drug use, admitted 04/14/13 for cocaine use   . Sexual Activity:    Partners: Female    Patent examiner Protection: Condom   Other Topics Concern  . Not on file   Social History Narrative     Current outpatient prescriptions:  .  amLODipine (NORVASC) 10 MG tablet, Take 1 tablet (10 mg total) by mouth daily., Disp: 90 tablet, Rfl: 1 .  diphenhydrAMINE (BENADRYL) 25 MG tablet, Take 1 tablet (25 mg total) by mouth every 6 (six) hours. As needed with Compazine for headache., Disp: 20 tablet, Rfl: 0 .  fluconazole (DIFLUCAN) 150 MG tablet, TAKE 1 TABLET BY MOUTH ONCE (Patient not taking: Reported on 05/08/2015), Disp: 1 tablet, Rfl: 1 .  glucose blood test strip, Use as instructed, Disp: 100 each, Rfl: 12 .  insulin aspart (NOVOLOG FLEXPEN) 100 UNIT/ML FlexPen, Use on a sliding scale between 5-15 units with each meal. Max daily dose 50 units per day., Disp: 15 mL, Rfl: 11 .  Insulin Glargine (LANTUS) 100 UNIT/ML Solostar Pen, Inject 42 Units into the skin at bedtime., Disp: 15 mL, Rfl: 11 .  Insulin Pen Needle (EASY TOUCH PEN NEEDLES) 32G X 6 MM MISC, Use with Flexpen to administer Insulin, Disp: 100 each, Rfl: 11 .  Lancets (FREESTYLE) lancets, Use as instructed, Disp: 100 each, Rfl: 12 .  metFORMIN (GLUCOPHAGE) 1000 MG tablet, Take 1 tablet (1,000 mg total) by mouth 2 (two) times daily with a meal., Disp:  180 tablet, Rfl: 1 .  metoCLOPramide (REGLAN) 10 MG tablet, Take 1 tablet (10 mg total) by mouth 3 (three) times daily as needed for nausea (headache / nausea)., Disp: 20 tablet, Rfl: 0 .  nystatin ointment (MYCOSTATIN), Apply 1 application topically daily as needed (affected area). , Disp: , Rfl: 0 .  oxyCODONE-acetaminophen (PERCOCET/ROXICET) 5-325 MG per tablet, Take 1 tablet by mouth every 12 (twelve) hours as needed for severe pain., Disp: , Rfl:  .  prochlorperazine (COMPAZINE) 10 MG tablet, Take 1 tablet (10 mg total) by mouth 2 (two) times daily as needed (Headache). Take with Benadryl 25 mg., Disp: 15 tablet, Rfl: 0 .  traZODone (DESYREL) 100 MG tablet, Take 100 mg by mouth at bedtime as needed for sleep. , Disp: , Rfl:   Allergies  Allergen Reactions  .  Other Other (See Comments)    Reports all narcotics make him sick.     ROS  CONSTITUTIONAL: No significant weight changes, fever, chills, weakness or fatigue.  HEENT:  - Eyes: No visual changes.  - Ears: No auditory changes. No pain.  - Nose: No sneezing, congestion, runny nose. - Throat: No sore throat. No changes in swallowing. SKIN: No rash or itching.  CARDIOVASCULAR: No chest pain, chest pressure or chest discomfort. No palpitations or edema.  RESPIRATORY: No shortness of breath, cough or sputum.  GASTROINTESTINAL: No anorexia, nausea, vomiting. No changes in bowel habits. No abdominal pain or blood with bowel movements.  GENITOURINARY: No dysuria. No frequency. No discharge.  NEUROLOGICAL: No headache, dizziness, syncope, paralysis, ataxia.  Yes numbness, pain or tingling in the extremities. No memory changes. No change in bowel or bladder control.  MUSCULOSKELETAL: Yes joint pain. No muscle pain. HEMATOLOGIC: No anemia, bleeding or bruising.  LYMPHATICS: No enlarged lymph nodes.  PSYCHIATRIC: No change in mood. No change in sleep pattern.  ENDOCRINOLOGIC: No reports of sweating, cold or heat intolerance. No polyuria or  polydipsia.   Objective  Filed Vitals:   05/30/15 1108  BP: 126/86  Pulse: 96  Temp: 98.7 F (37.1 C)  TempSrc: Oral  Resp: 16  Height: 5' 8.5" (1.74 m)  Weight: 229 lb 8 oz (104.101 kg)  SpO2: 97%   Body mass index is 34.38 kg/(m^2).  Physical Exam  Constitutional: Patient is obese and well-nourished. In no distress.  HEENT:  - Head: Normocephalic and atraumatic.  - Ears: Bilateral TMs gray, no erythema or effusion - Nose: Nasal mucosa moist - Mouth/Throat: Oropharynx is clear and moist. No tonsillar hypertrophy or erythema. No post nasal drainage.  - Eyes: Conjunctivae clear, EOM movements normal. PERRLA. No scleral icterus.  Neck: Normal range of motion. Neck supple. No JVD present. No thyromegaly present.  Cardiovascular: Normal rate, regular rhythm and normal heart sounds.  No murmur heard.  Pulmonary/Chest: Effort normal and breath sounds normal. No respiratory distress. Abdomen: Soft and obese with waist circumference over 40 inches (male), non tender, bowel sounds normal in all quadrants, difficult examining internal organs due to body habitus Musculoskeletal: Normal passive range of motion bilateral UE and LE, no joint effusions.  Peripheral vascular: Bilateral LE no edema. Neurological: CN II-XII grossly intact with no focal deficits. Alert and oriented to person, place, and time. Coordination, balance, strength, speech and gait are normal.  Skin: Skin is warm and dry. No rash noted. No erythema.  Psychiatric: Patient has a normal mood and affect. Behavior is normal in office today. Judgment and thought content normal in office today.   Diabetic Foot Exam - Simple   Simple Foot Form  Diabetic Foot exam was performed with the following findings:  Yes 05/30/2015  3:51 PM  Visual Inspection  No deformities, no ulcerations, no other skin breakdown bilaterally:  Yes  Sensation Testing  Intact to touch and monofilament testing bilaterally:  Yes  Pulse Check  Posterior  Tibialis and Dorsalis pulse intact bilaterally:  Yes  Comments  Thickened toe nails        Assessment & Plan  1. Uncontrolled type 2 diabetes mellitus with peripheral circulatory disorder Patient's Hba1c goal is <6.5% for stringent control.  Patient's Hba1c goal is <7% is acceptable  Encouraged patient to continue efforts on checking blood glucose on a daily basis. Fasting blood glucose control goal is 80-135m/dL and post prandial blood glucose control is <1868mdL.  Reviewed diet, exercise, lifestyle  changes and current medication regimen pertaining to diabetes with the patient.   Reminded patient of the required annual dilated retinal exam.     - CBC with Differential/Platelet - Comprehensive metabolic panel - Hemoglobin A1c - glucose blood (ACCU-CHEK AVIVA) test strip; Use as instructed to check blood glucose 4x/day  Dispense: 200 each; Refill: 12 - Lancets (FREESTYLE) lancets; Use as instructed to check blood glucose 4x/day  Dispense: 200 each; Refill: 12 - gabapentin (NEURONTIN) 100 MG capsule; Take 1 capsule (100 mg total) by mouth 3 (three) times daily.  Dispense: 90 capsule; Refill: 3 - Ambulatory referral to Ophthalmology  2. Hypertension, goal below 140/90 Clinically stable findings based on clinical exam and on review of any pertinent results. Recommended to patient that they continue their current regimen with regular follow ups.  - CBC with Differential/Platelet - Comprehensive metabolic panel  3. Chronic hepatitis C without hepatic coma Has not followed up with specialist, he states no one has reached out to him for an appointment.  - CBC with Differential/Platelet - Comprehensive metabolic panel - Hepatitis C antibody - HCV RNA quant  4. Hyperlipidemia LDL goal <100 Not on statin therapy. Will recheck FLP.   - CBC with Differential/Platelet - Comprehensive metabolic panel - Lipid panel  5. Balanitis Chronic condition consulted with Urology, can not  perform surgery for scar tissue and adhesion of foreskin to glans of penis from chronic balanitis. He continues to use Fluconazole once a week and has been counseled on adverse risks to his liver in addition to his untreated Hep C.  - fluconazole (DIFLUCAN) 150 MG tablet; Take 1 tablet (150 mg total) by mouth every 7 (seven) days.  Dispense: 4 tablet; Refill: 3 - nystatin ointment (MYCOSTATIN); Apply 1 application topically 3 (three) times daily.  Dispense: 30 g; Refill: 2  6. Chronic pain of multiple joints We discussed potential pathology and long term risk of reoccurrence. Maintaining an ideal body habitus, regular exercise, proper lifting techniques and mindfulness of exacerbating factors will be useful in long term management.  Instructed on use of heating pad with exercises. Consider concomitant therapy with PT, massage therapist or chiropractor. May use anti-inflammatory medication and muscle relaxer as needed.  - gabapentin (NEURONTIN) 100 MG capsule; Take 1 capsule (100 mg total) by mouth 3 (three) times daily.  Dispense: 90 capsule; Refill: 3 - oxyCODONE-acetaminophen (PERCOCET/ROXICET) 5-325 MG per tablet; Take 1 tablet by mouth every 12 (twelve) hours as needed for severe pain.  Dispense: 60 tablet; Refill: 0  7. Metabolic syndrome Metabolic syndrome as evidenced by meeting criteria outlined by NCEP ATP III definition, metabolic syndrome is present if three or more of the following five criteria are met: waist circumference over 40 inches (men) or 35 inches (women), blood pressure over 130/85 mmHg, fasting triglyceride (TG) level over 150 mg/dl, fasting high-density lipoprotein (HDL) cholesterol level less than 40 mg/dl (men) or 50 mg/dl (women) and fasting blood sugar over 100 mg/dl.

## 2015-05-31 ENCOUNTER — Telehealth: Payer: Self-pay | Admitting: Family Medicine

## 2015-05-31 DIAGNOSIS — E8881 Metabolic syndrome: Secondary | ICD-10-CM | POA: Insufficient documentation

## 2015-05-31 NOTE — Telephone Encounter (Signed)
Pt also wants to know if he could get a RX for Flexeril to be sent to [facility] [address]

## 2015-05-31 NOTE — Telephone Encounter (Signed)
Pt would like to know if he could get a referral to neurosurgeon.

## 2015-06-01 NOTE — Telephone Encounter (Signed)
LMOM to have pt call the office.

## 2015-06-01 NOTE — Telephone Encounter (Signed)
Patient scheduled appointment for sept 2 to discuss the referral

## 2015-06-07 ENCOUNTER — Other Ambulatory Visit: Payer: Self-pay | Admitting: Family Medicine

## 2015-06-07 DIAGNOSIS — E1165 Type 2 diabetes mellitus with hyperglycemia: Principal | ICD-10-CM

## 2015-06-07 DIAGNOSIS — E1151 Type 2 diabetes mellitus with diabetic peripheral angiopathy without gangrene: Secondary | ICD-10-CM

## 2015-06-07 DIAGNOSIS — IMO0002 Reserved for concepts with insufficient information to code with codable children: Secondary | ICD-10-CM

## 2015-06-08 ENCOUNTER — Encounter: Payer: Self-pay | Admitting: Family Medicine

## 2015-06-08 ENCOUNTER — Ambulatory Visit (INDEPENDENT_AMBULATORY_CARE_PROVIDER_SITE_OTHER): Payer: Medicaid Other | Admitting: Family Medicine

## 2015-06-08 ENCOUNTER — Ambulatory Visit: Payer: Medicaid Other | Admitting: Family Medicine

## 2015-06-08 VITALS — BP 142/90 | HR 97 | Temp 98.5°F | Resp 16 | Wt 228.7 lb

## 2015-06-08 DIAGNOSIS — B182 Chronic viral hepatitis C: Secondary | ICD-10-CM | POA: Diagnosis not present

## 2015-06-08 DIAGNOSIS — E785 Hyperlipidemia, unspecified: Secondary | ICD-10-CM | POA: Diagnosis not present

## 2015-06-08 DIAGNOSIS — E1159 Type 2 diabetes mellitus with other circulatory complications: Secondary | ICD-10-CM | POA: Diagnosis not present

## 2015-06-08 DIAGNOSIS — IMO0002 Reserved for concepts with insufficient information to code with codable children: Secondary | ICD-10-CM

## 2015-06-08 DIAGNOSIS — I10 Essential (primary) hypertension: Secondary | ICD-10-CM

## 2015-06-08 DIAGNOSIS — G8929 Other chronic pain: Secondary | ICD-10-CM | POA: Diagnosis not present

## 2015-06-08 DIAGNOSIS — E1165 Type 2 diabetes mellitus with hyperglycemia: Secondary | ICD-10-CM

## 2015-06-08 DIAGNOSIS — M2550 Pain in unspecified joint: Secondary | ICD-10-CM

## 2015-06-08 DIAGNOSIS — M5412 Radiculopathy, cervical region: Secondary | ICD-10-CM

## 2015-06-08 DIAGNOSIS — M5137 Other intervertebral disc degeneration, lumbosacral region: Secondary | ICD-10-CM | POA: Insufficient documentation

## 2015-06-08 DIAGNOSIS — E1151 Type 2 diabetes mellitus with diabetic peripheral angiopathy without gangrene: Secondary | ICD-10-CM

## 2015-06-08 LAB — CBC WITH DIFFERENTIAL/PLATELET
BASOS: 0 %
Basophils Absolute: 0 10*3/uL (ref 0.0–0.2)
EOS (ABSOLUTE): 0.1 10*3/uL (ref 0.0–0.4)
EOS: 2 %
HEMATOCRIT: 43.6 % (ref 37.5–51.0)
Hemoglobin: 14.9 g/dL (ref 12.6–17.7)
IMMATURE GRANS (ABS): 0 10*3/uL (ref 0.0–0.1)
Immature Granulocytes: 0 %
LYMPHS ABS: 2.8 10*3/uL (ref 0.7–3.1)
LYMPHS: 60 %
MCH: 29.7 pg (ref 26.6–33.0)
MCHC: 34.2 g/dL (ref 31.5–35.7)
MCV: 87 fL (ref 79–97)
MONOCYTES: 8 %
Monocytes Absolute: 0.3 10*3/uL (ref 0.1–0.9)
NEUTROS ABS: 1.4 10*3/uL (ref 1.4–7.0)
Neutrophils: 30 %
Platelets: 180 10*3/uL (ref 150–379)
RBC: 5.02 x10E6/uL (ref 4.14–5.80)
RDW: 15 % (ref 12.3–15.4)
WBC: 4.6 10*3/uL (ref 3.4–10.8)

## 2015-06-08 LAB — LIPID PANEL
Chol/HDL Ratio: 6.8 ratio units — ABNORMAL HIGH (ref 0.0–5.0)
Cholesterol, Total: 184 mg/dL (ref 100–199)
HDL: 27 mg/dL — ABNORMAL LOW (ref >39)
LDL Calculated: 126 mg/dL — ABNORMAL HIGH (ref 0–99)
Triglycerides: 153 mg/dL — ABNORMAL HIGH (ref 0–149)
VLDL Cholesterol Cal: 31 mg/dL (ref 5–40)

## 2015-06-08 LAB — COMPREHENSIVE METABOLIC PANEL
ALBUMIN: 4.5 g/dL (ref 3.6–4.8)
ALT: 117 IU/L — ABNORMAL HIGH (ref 0–44)
AST: 64 IU/L — ABNORMAL HIGH (ref 0–40)
Albumin/Globulin Ratio: 1.5 (ref 1.1–2.5)
Alkaline Phosphatase: 83 IU/L (ref 39–117)
BILIRUBIN TOTAL: 0.5 mg/dL (ref 0.0–1.2)
BUN / CREAT RATIO: 13 (ref 10–22)
BUN: 10 mg/dL (ref 8–27)
CHLORIDE: 98 mmol/L (ref 97–108)
CO2: 23 mmol/L (ref 18–29)
Calcium: 9.5 mg/dL (ref 8.6–10.2)
Creatinine, Ser: 0.8 mg/dL (ref 0.76–1.27)
GFR calc Af Amer: 112 mL/min/{1.73_m2} (ref >59)
GFR calc non Af Amer: 97 mL/min/{1.73_m2} (ref >59)
GLOBULIN, TOTAL: 3.1 g/dL (ref 1.5–4.5)
Glucose: 154 mg/dL — ABNORMAL HIGH (ref 65–99)
Potassium: 4.2 mmol/L (ref 3.5–5.2)
SODIUM: 137 mmol/L (ref 134–144)
TOTAL PROTEIN: 7.6 g/dL (ref 6.0–8.5)

## 2015-06-08 LAB — HEMOGLOBIN A1C
ESTIMATED AVERAGE GLUCOSE: 243 mg/dL
Hgb A1c MFr Bld: 10.1 % — ABNORMAL HIGH (ref 4.8–5.6)

## 2015-06-08 LAB — HEPATITIS C ANTIBODY: Hep C Virus Ab: >11 s/co ratio — ABNORMAL HIGH (ref 0.0–0.9)

## 2015-06-08 NOTE — Progress Notes (Signed)
Name: William [name]   MRN: [id number]    DOB: 1956/06/03   Date:06/08/2015       Progress Note  Subjective  Chief Complaint  Chief Complaint  Patient presents with  . Referral    patient is here for a neurosurgeon referral    HPI  William [name] is a 59 year old male who is here today to discuss his ongoing medical issue including joint pain.  William [name] has a significant medical history of TIAs in the past, ongoing struggle with uncontrolled DM II, HTN, HLD, untreated Hep C (due to poor follow up with specialists), elevated liver enzymes, chronic balanitis of penis with subsequent scar tissue, OSA, depression, Migraine headaches.. First diagnosed with DM Type II several years ago. Current diabetes medication regimen includes Metformin 1000 mg twice a day, Lantus 45 units daily, Novolog sliding scale. Average novolog insulin used is 12-15 units per meal patient reports. William [name] is taking medications as instructed. Overall the patient feels that their blood glucose is not well controlled but improving. Last Hba1c was 05/07/15 result 10.1% which is improved from his highest 14% 11 months ago. Checking blood glucose 3-4  times a day consistently. He has had hypoglycemia in the remote history.  Fluctuating glucose is due to noncompliance with diet and lifestyle and disease progression.  Related symptoms include numbness and tingling in feet and arms but he is not sure if this is related to cervical spine issues. 04/2014 at [facility].  He was tested for HLD because co morbid conditions such as DM II, HTN, Obesity. There is a family history of hyperlipidemia. There is a family history of early ischemia heart disease. First diagnosed with hypertension several years ago. Current anti-hypertension medication regimen includes dietary modification, weight management and amlodipine 10 mg one a day. Patient is following physician recommended management. Not checking blood pressure outside of physician office.  Associated symptoms do not include headache, dizziness, nausea, lower extremity swelling, shortness of breath, chest pain.  Recent lab work showed elevated liver enzymes, uncontrolled Hepatitis C.  William [name] states that no one has reached out to him to schedule an appointment for his Hep C follow up and treatment despite him being referred several times to [facility]. William [name] complains of chronic joint pain in his knees, lower back, neck, right elbow and both shoulders with numbness in both hands more recently.  Pain is described at aching and throbbing.  Associated symptoms is reduced strength and range of motion, especially in left arm.  Long standing problem in left shoulder with rotator pathology and clavicular fracture in the past.  Symptoms are constant but wax and wane in intensity.  Current medications used are percocet 5-325 mg sparingly and muscle relaxer as needed.  Gabapentin was restarted at our last visit. Today he reports that he would like to consult with a spinal specialist.  Patient Active Problem List   Diagnosis Date Noted  . Metabolic syndrome 05/31/2015  . Knee pain 07/12/2014  . Balanitis 07/12/2014  . History of cocaine use 07/12/2014  . Depression 07/12/2014  . Chronic pain of multiple joints 07/12/2014  . Hx-TIA (transient ischemic attack) 07/12/2014  . Hx of migraine headaches 07/12/2014  . Obesity, Class I, BMI 30.0-34.9 (see actual BMI) 07/12/2014  . Chronic hepatitis C 07/11/2014  . Hyperlipidemia LDL goal <100 07/11/2014  . Uncontrolled type 2 diabetes mellitus with peripheral circulatory disorder 12/02/2013  . Hypertension, goal below 140/90 12/02/2013    Social History  Substance Use Topics  . Smoking status: Current Every Day Smoker -- 30 years    Types: Cigarettes    Last Attempt to Quit: 09/27/2014  . Smokeless tobacco: Not on file  . Alcohol Use: 0.0 oz/week    0 Standard drinks or equivalent per week     Current outpatient  prescriptions:  .  amLODipine (NORVASC) 10 MG tablet, Take 1 tablet (10 mg total) by mouth daily., Disp: 90 tablet, Rfl: 1 .  cyclobenzaprine (FLEXERIL) 10 MG tablet, Take 1 tablet (10 mg total) by mouth 3 (three) times daily as needed for muscle spasms., Disp: 50 tablet, Rfl: 2 .  diphenhydrAMINE (BENADRYL) 25 MG tablet, Take 1 tablet (25 mg total) by mouth every 6 (six) hours. As needed with Compazine for headache., Disp: 20 tablet, Rfl: 0 .  fluconazole (DIFLUCAN) 150 MG tablet, Take 1 tablet (150 mg total) by mouth every 7 (seven) days., Disp: 4 tablet, Rfl: 3 .  gabapentin (NEURONTIN) 100 MG capsule, Take 1 capsule (100 mg total) by mouth 3 (three) times daily., Disp: 90 capsule, Rfl: 3 .  glucose blood (ACCU-CHEK AVIVA) test strip, Use as instructed to check blood glucose 4x/day, Disp: 200 each, Rfl: 12 .  Insulin Pen Needle (EASY TOUCH PEN NEEDLES) 32G X 6 MM MISC, Use with Flexpen to administer Insulin, Disp: 100 each, Rfl: 11 .  Lancets (FREESTYLE) lancets, Use as instructed to check blood glucose 4x/day, Disp: 200 each, Rfl: 12 .  LANTUS SOLOSTAR 100 UNIT/ML Solostar Pen, INJECT 45 UNITS SUBCUTANEOUSLY AT BEDTIME, Disp: 15 pen, Rfl: 5 .  metFORMIN (GLUCOPHAGE) 1000 MG tablet, Take 1 tablet (1,000 mg total) by mouth 2 (two) times daily with a meal., Disp: 180 tablet, Rfl: 1 .  metoCLOPramide (REGLAN) 10 MG tablet, Take 1 tablet (10 mg total) by mouth 3 (three) times daily as needed for nausea (headache / nausea)., Disp: 20 tablet, Rfl: 0 .  NOVOLOG FLEXPEN 100 UNIT/ML FlexPen, SLIDING SCALE: 100-150=8UNITS, 151-200= 12UNITS, 201-250= 16UNITS 251-300= 20UNITS 301-350= 24 UNITS, Disp: 15 pen, Rfl: 5 .  nystatin ointment (MYCOSTATIN), Apply 1 application topically 3 (three) times daily., Disp: 30 g, Rfl: 2 .  oxyCODONE-acetaminophen (PERCOCET/ROXICET) 5-325 MG per tablet, Take 1 tablet by mouth every 12 (twelve) hours as needed for severe pain., Disp: 60 tablet, Rfl: 0 .  prochlorperazine  (COMPAZINE) 10 MG tablet, Take 1 tablet (10 mg total) by mouth 2 (two) times daily as needed (Headache). Take with Benadryl 25 mg., Disp: 15 tablet, Rfl: 0 .  traZODone (DESYREL) 100 MG tablet, Take 100 mg by mouth at bedtime as needed for sleep. , Disp: , Rfl:   Past Surgical History  Procedure Laterality Date  . Rotator  cuff surgery       left shoulder     Family History  Problem Relation Age of Onset  . Hypertension Mother   . Diabetes Mother   . Heart disease Mother   . Thyroid disease Mother   . Hypertension Father   . Diabetes Father   . Heart disease Father     Allergies  Allergen Reactions  . Other Other (See Comments)    Reports all narcotics make him sick.     Review of Systems  CONSTITUTIONAL: No significant weight changes, fever, chills, weakness or fatigue.  HEENT:  - Eyes: No visual changes.  - Ears: No auditory changes. No pain.  - Nose: No sneezing, congestion, runny nose. - Throat: No sore throat. No changes in swallowing. SKIN: No  rash or itching.  CARDIOVASCULAR: No chest pain, chest pressure or chest discomfort. No palpitations or edema.  RESPIRATORY: No shortness of breath, cough or sputum.  GASTROINTESTINAL: No anorexia, nausea, vomiting. No changes in bowel habits. No abdominal pain or blood.  GENITOURINARY: No dysuria. No frequency. No discharge. NEUROLOGICAL: No headache, dizziness, syncope, paralysis, ataxia. Yes numbness or tingling in the extremities. No memory changes. No change in bowel or bladder control.  MUSCULOSKELETAL: Yes joint pain. Yes muscle pain. HEMATOLOGIC: No anemia, bleeding or bruising.  LYMPHATICS: No enlarged lymph nodes.  PSYCHIATRIC: No change in mood. No change in sleep pattern.  ENDOCRINOLOGIC: No reports of sweating, cold or heat intolerance. No polyuria or polydipsia.     Objective  BP 142/90 mmHg  Pulse 97  Temp(Src) 98.5 F (36.9 C) (Oral)  Resp 16  Wt 228 lb 11.2 oz (103.738 kg)  SpO2 97% Body mass  index is 34.26 kg/(m^2).  Physical Exam  Constitutional: Patient appears well-developed and well-nourished. In no distress.  HEENT:  - Head: Normocephalic and atraumatic. Soft mass under scalp freely mobile top midline head about 2 cm diameter, non tender. - Ears: Bilateral TMs gray, no erythema or effusion - Nose: Nasal mucosa moist - Mouth/Throat: Oropharynx is clear and moist. No tonsillar hypertrophy or erythema. No post nasal drainage.  - Eyes: Conjunctivae clear, EOM movements normal. PERRLA. No scleral icterus.  Neck: Normal range of motion. Neck supple. No JVD present. No thyromegaly present.  Cardiovascular: Normal rate, regular rhythm and normal heart sounds.  No murmur heard.  Pulmonary/Chest: Effort normal and breath sounds normal. No respiratory distress. Abdomen: Soft and obese with waist circumference over 40 inches (male), non tender, bowel sounds normal in all quadrants, difficult examining internal organs due to body habitus but hepatomegaly suspected. Musculoskeletal: Genu Varum of legs. Right UE full passive ROM, Left UE wasting rotator muscles with decreased strength 4/5 and anterior prominent humeral head. Sensation intact throughout. No palpable step off of C-spine. Positive spurling sign bilaterally. Negative phalen's testing bilaterally. No palpable step off L-spine. Negative straight leg testing.  Peripheral vascular: Bilateral LE no edema. Neurological: CN II-XII grossly intact with no focal deficits. Alert and oriented to person, place, and time.  Skin: Skin is warm and dry. No rash noted. No erythema.  Psychiatric: Patient has a normal mood and affect. Behavior is normal in office today. Judgment and thought content normal in office today.   Recent Results (from the past 2160 hour(s))  PSA     Status: None   Collection Time: 04/10/15  2:10 PM  Result Value Ref Range   Prostate Specific Ag, Serum 2.3 0.0 - 4.0 ng/mL    Comment: Roche ECLIA  methodology. According to the American Urological Association, Serum PSA should decrease and remain at undetectable levels after radical prostatectomy. The AUA defines biochemical recurrence as an initial PSA value 0.2 ng/mL or greater followed by a subsequent confirmatory PSA value 0.2 ng/mL or greater. Values obtained with different assay methods or kits cannot be used interchangeably. Results cannot be interpreted as absolute evidence of the presence or absence of malignant disease.   CBC with Differential/Platelet     Status: None   Collection Time: 06/07/15 10:41 AM  Result Value Ref Range   WBC 4.6 3.4 - 10.8 x10E3/uL   RBC 5.02 4.14 - 5.80 x10E6/uL   Hemoglobin 14.9 12.6 - 17.7 g/dL   Hematocrit 16.1 09.6 - 51.0 %   MCV 87 79 - 97 fL   MCH  29.7 26.6 - 33.0 pg   MCHC 34.2 31.5 - 35.7 g/dL   RDW 16.1 09.6 - 04.5 %   Platelets 180 150 - 379 x10E3/uL   Neutrophils 30 %   Lymphs 60 %   Monocytes 8 %   Eos 2 %   Basos 0 %   Neutrophils Absolute 1.4 1.4 - 7.0 x10E3/uL   Lymphocytes Absolute 2.8 0.7 - 3.1 x10E3/uL   Monocytes Absolute 0.3 0.1 - 0.9 x10E3/uL   EOS (ABSOLUTE) 0.1 0.0 - 0.4 x10E3/uL   Basophils Absolute 0.0 0.0 - 0.2 x10E3/uL   Immature Granulocytes 0 %   Immature Grans (Abs) 0.0 0.0 - 0.1 x10E3/uL  Comprehensive metabolic panel     Status: Abnormal   Collection Time: 06/07/15 10:41 AM  Result Value Ref Range   Glucose 154 (H) 65 - 99 mg/dL   BUN 10 8 - 27 mg/dL   Creatinine, Ser 4.09 0.76 - 1.27 mg/dL   GFR calc non Af Amer 97 >59 mL/min/1.73   GFR calc Af Amer 112 >59 mL/min/1.73   BUN/Creatinine Ratio 13 10 - 22   Sodium 137 134 - 144 mmol/L   Potassium 4.2 3.5 - 5.2 mmol/L   Chloride 98 97 - 108 mmol/L   CO2 23 18 - 29 mmol/L   Calcium 9.5 8.6 - 10.2 mg/dL   Total Protein 7.6 6.0 - 8.5 g/dL   Albumin 4.5 3.6 - 4.8 g/dL   Globulin, Total 3.1 1.5 - 4.5 g/dL   Albumin/Globulin Ratio 1.5 1.1 - 2.5   Bilirubin Total 0.5 0.0 - 1.2 mg/dL   Alkaline  Phosphatase 83 39 - 117 IU/L   AST 64 (H) 0 - 40 IU/L   ALT 117 (H) 0 - 44 IU/L  Lipid panel     Status: Abnormal   Collection Time: 06/07/15 10:41 AM  Result Value Ref Range   Cholesterol, Total 184 100 - 199 mg/dL   Triglycerides 811 (H) 0 - 149 mg/dL   HDL 27 (L) >91 mg/dL    Comment: According to ATP-III Guidelines, HDL-C >59 mg/dL is considered a negative risk factor for CHD.    VLDL Cholesterol Cal 31 5 - 40 mg/dL   LDL Calculated 478 (H) 0 - 99 mg/dL   Chol/HDL Ratio 6.8 (H) 0.0 - 5.0 ratio units    Comment:                                   T. Chol/HDL Ratio                                             Men  Women                               1/2 Avg.Risk  3.4    3.3                                   Avg.Risk  5.0    4.4                                2X Avg.Risk  9.6  7.1                                3X Avg.Risk 23.4   11.0   Hemoglobin A1c     Status: Abnormal   Collection Time: 06/07/15 10:41 AM  Result Value Ref Range   Hgb A1c MFr Bld 10.1 (H) 4.8 - 5.6 %    Comment:          Pre-diabetes: 5.7 - 6.4          Diabetes: >6.4          Glycemic control for adults with diabetes: <7.0    Est. average glucose Bld gHb Est-mCnc 243 mg/dL  Hepatitis C antibody     Status: Abnormal   Collection Time: 06/07/15 10:41 AM  Result Value Ref Range   Hep C Virus Ab >11.0 (H) 0.0 - 0.9 s/co ratio    Comment:                                   Negative:     < 0.8                              Indeterminate: 0.8 - 0.9                                   Positive:     > 0.9  The CDC recommends that a positive HCV antibody result  be followed up with a HCV Nucleic Acid Amplification  test (161096).   HCV RNA quant     Status: None (Preliminary result)   Collection Time: 06/07/15 10:41 AM  Result Value Ref Range   Test Information Comment     Comment: The quantitative range of this assay is 15 IU/mL to 100 million IU/mL.     Assessment & Plan  1. Chronic pain of multiple  joints Under pain management with me. Using pain medication sparingly. Recently started on low dose Gabapentin.   2. Uncontrolled type 2 diabetes mellitus with peripheral circulatory disorder Reviewed recent lab results showing Hba1c 10.1% improved from 14% 11 months ago. Continue current regimen.  3. Chronic hepatitis C without hepatic coma Reviewed all lab work with William [name]. We called over to [facility], coordinated care for him, and we found out that patient has been a no show for 2 visits but they were willing to schedule him for October 2016. I have written down and handed him the date and time and location of his appointment and expressed concern that if he continues to miss his appointments it would be difficult for him to get the help he needs. I am concerned regarding the long term consequences of liver cirrhosis for William [name] as he already has a complicated medical picture.   4. Chronic radicular cervical pain Consultation with Neurosurgical specialist placed however I do not recommend any major surgical interventions until he has cardiac clearance and better control of his blood glucose.  - Ambulatory referral to Neurosurgery  5. Degeneration of lumbar/lumbosacral disc without myelopathy Consultation with Neurosurgical specialist placed however I do not recommend any major surgical interventions until he has cardiac clearance and better control of his blood glucose.  - Ambulatory referral to Neurosurgery  6. Hyperlipidemia LDL goal <100 Reviewed lab work. Avoid statin use currently due to liver cirrhosis. Again diet and lifestyle changes highly encouraged.   7. Hypertension, goal below 140/90 Elevated today but previous results wnl, will monitor.

## 2015-06-09 LAB — HCV RNA QUANT
HCV log10: 6.597 log10 IU/mL
Hepatitis C Quantitation: 3950000 IU/mL

## 2015-07-02 ENCOUNTER — Other Ambulatory Visit: Payer: Self-pay | Admitting: Family Medicine

## 2015-07-02 DIAGNOSIS — G8929 Other chronic pain: Secondary | ICD-10-CM

## 2015-07-02 DIAGNOSIS — M2550 Pain in unspecified joint: Principal | ICD-10-CM

## 2015-07-02 NOTE — Telephone Encounter (Signed)
Refill request was sent to Dr. Ashany Sundaram for approval and submission.  

## 2015-07-02 NOTE — Telephone Encounter (Signed)
Pt needs refill on Oxycodone

## 2015-07-03 MED ORDER — OXYCODONE-ACETAMINOPHEN 5-325 MG PO TABS: 1.0000 | ORAL_TABLET | Freq: Two times a day (BID) | ORAL | Status: DC | PRN

## 2015-07-10 ENCOUNTER — Other Ambulatory Visit: Payer: Self-pay | Admitting: Family Medicine

## 2015-07-10 ENCOUNTER — Ambulatory Visit (INDEPENDENT_AMBULATORY_CARE_PROVIDER_SITE_OTHER): Payer: Medicaid Other | Admitting: Family Medicine

## 2015-07-10 ENCOUNTER — Encounter: Payer: Self-pay | Admitting: Family Medicine

## 2015-07-10 VITALS — BP 122/96 | HR 117 | Temp 98.5°F | Resp 18 | Ht 68.5 in | Wt 225.0 lb

## 2015-07-10 DIAGNOSIS — E1151 Type 2 diabetes mellitus with diabetic peripheral angiopathy without gangrene: Secondary | ICD-10-CM

## 2015-07-10 DIAGNOSIS — M19041 Primary osteoarthritis, right hand: Secondary | ICD-10-CM

## 2015-07-10 DIAGNOSIS — E1165 Type 2 diabetes mellitus with hyperglycemia: Secondary | ICD-10-CM

## 2015-07-10 DIAGNOSIS — IMO0002 Reserved for concepts with insufficient information to code with codable children: Secondary | ICD-10-CM

## 2015-07-10 DIAGNOSIS — M1810 Unilateral primary osteoarthritis of first carpometacarpal joint, unspecified hand: Secondary | ICD-10-CM | POA: Insufficient documentation

## 2015-07-10 DIAGNOSIS — M1811 Unilateral primary osteoarthritis of first carpometacarpal joint, right hand: Secondary | ICD-10-CM

## 2015-07-10 MED ORDER — INSULIN GLARGINE 100 UNIT/ML SOLOSTAR PEN: 50.0000 [IU] | PEN_INJECTOR | Freq: Every day | SUBCUTANEOUS | Status: DC

## 2015-07-10 MED ORDER — THUMB BRACE MISC: 1.0000 | Freq: Every day | Status: DC | PRN

## 2015-07-10 MED ORDER — INSULIN ASPART 100 UNIT/ML FLEXPEN: 25.0000 [IU] | PEN_INJECTOR | Freq: Three times a day (TID) | SUBCUTANEOUS | Status: DC

## 2015-07-10 NOTE — Progress Notes (Signed)
Name: William [name]   MRN: [id number]    DOB: [birth year]   Date:07/10/2015       Progress Note  Subjective  Chief Complaint  Chief Complaint  Patient presents with  . Diabetes    follow-up  . Medication Management    insulin adjustment    HPI  [name] is a 59 year old male here for 1 month follow up of DM II. He also complains of right hand 1st digit arthritis, stiffness, joint is turning. He is requesting a brace.   First diagnosed with DM Type II several years ago. Current diabetes medication regimen includes Metformin 1000 mg twice a day, Lantus 45 units daily, Novolog sliding scale. Average novolog insulin used is 15-20 units patient reports. [name] is taking medications as instructed. Overall the patient feels that their blood glucose is not well controlled. Checking blood glucose 3-4 times a day consistently. No symptoms of hypoglycemia are reported. Fasting glucose range 150 - 190. Meal time glucose range 250 - 400. Lowest glucose result 98. Fluctuating glucose is due to non compliance with diet and lifestyle and disease progression. Related symptoms include numbness and tingling in feet and arms but he is not sure if this is related to cervical spine issues. Related medical diagnosis include frequent balanitis, hx of TIA, HTN, HLD, Obesity, Metabolic Syndrome. The last retinal eye exam was 04/2014 at [facility].    Past Medical History  Diagnosis Date  . Hypertension   . Diabetes mellitus without complication (HCC)   . Chronic hepatitis C without hepatic coma (HCC)   . History of cocaine use   . Depression   . Arthritis   . History of TIA (transient ischemic attack)   . Hyperlipidemia   . History of migraine headaches   . Obesity, Class II, BMI 35-39.9     Patient Active Problem List   Diagnosis Date Noted  . Degenerative arthritis of thumb 07/10/2015  . Chronic radicular cervical pain 06/08/2015  . Degeneration of lumbar/lumbosacral disc without  myelopathy 06/08/2015  . Metabolic syndrome 05/31/2015  . Knee pain 07/12/2014  . Balanitis 07/12/2014  . History of cocaine use 07/12/2014  . Depression 07/12/2014  . Chronic pain of multiple joints 07/12/2014  . Hx-TIA (transient ischemic attack) 07/12/2014  . Hx of migraine headaches 07/12/2014  . Obesity, Class I, BMI 30.0-34.9 (see actual BMI) 07/12/2014  . Chronic hepatitis C (HCC) 07/11/2014  . Hyperlipidemia LDL goal <100 07/11/2014  . Uncontrolled type 2 diabetes mellitus with peripheral circulatory disorder (HCC) 12/02/2013  . Hypertension, goal below 140/90 12/02/2013    Social History  Substance Use Topics  . Smoking status: Current Every Day Smoker -- 30 years    Types: Cigarettes    Last Attempt to Quit: 09/27/2014  . Smokeless tobacco: Not on file  . Alcohol Use: 0.0 oz/week    0 Standard drinks or equivalent per week     Current outpatient prescriptions:  .  amLODipine (NORVASC) 10 MG tablet, Take 1 tablet (10 mg total) by mouth daily., Disp: 90 tablet, Rfl: 1 .  cyclobenzaprine (FLEXERIL) 10 MG tablet, Take 1 tablet (10 mg total) by mouth 3 (three) times daily as needed for muscle spasms., Disp: 50 tablet, Rfl: 2 .  diphenhydrAMINE (BENADRYL) 25 MG tablet, Take 1 tablet (25 mg total) by mouth every 6 (six) hours. As needed with Compazine for headache., Disp: 20 tablet, Rfl: 0 .  Elastic Bandages & Supports (THUMB BRACE) MISC, 1 Device by Does  not apply route daily as needed., Disp: 1 each, Rfl: 0 .  fluconazole (DIFLUCAN) 150 MG tablet, Take 1 tablet (150 mg total) by mouth every 7 (seven) days., Disp: 4 tablet, Rfl: 3 .  gabapentin (NEURONTIN) 100 MG capsule, Take 1 capsule (100 mg total) by mouth 3 (three) times daily., Disp: 90 capsule, Rfl: 3 .  glucose blood (ACCU-CHEK AVIVA) test strip, Use as instructed to check blood glucose 4x/day, Disp: 200 each, Rfl: 12 .  Insulin Pen Needle (EASY TOUCH PEN NEEDLES) 32G X 6 MM MISC, Use with Flexpen to administer  Insulin, Disp: 100 each, Rfl: 11 .  Lancets (FREESTYLE) lancets, Use as instructed to check blood glucose 4x/day, Disp: 200 each, Rfl: 12 .  LANTUS SOLOSTAR 100 UNIT/ML Solostar Pen, INJECT 45 UNITS SUBCUTANEOUSLY AT BEDTIME, Disp: 15 pen, Rfl: 5 .  metFORMIN (GLUCOPHAGE) 1000 MG tablet, Take 1 tablet (1,000 mg total) by mouth 2 (two) times daily with a meal., Disp: 180 tablet, Rfl: 1 .  metoCLOPramide (REGLAN) 10 MG tablet, Take 1 tablet (10 mg total) by mouth 3 (three) times daily as needed for nausea (headache / nausea)., Disp: 20 tablet, Rfl: 0 .  NOVOLOG FLEXPEN 100 UNIT/ML FlexPen, SLIDING SCALE: 100-150=8UNITS, 151-200= 12UNITS, 201-250= 16UNITS 251-300= 20UNITS 301-350= 24 UNITS, Disp: 15 pen, Rfl: 5 .  nystatin ointment (MYCOSTATIN), Apply 1 application topically 3 (three) times daily., Disp: 30 g, Rfl: 2 .  oxyCODONE-acetaminophen (PERCOCET/ROXICET) 5-325 MG per tablet, Take 1 tablet by mouth every 12 (twelve) hours as needed for severe pain., Disp: 60 tablet, Rfl: 0 .  prochlorperazine (COMPAZINE) 10 MG tablet, Take 1 tablet (10 mg total) by mouth 2 (two) times daily as needed (Headache). Take with Benadryl 25 mg., Disp: 15 tablet, Rfl: 0 .  traZODone (DESYREL) 100 MG tablet, Take 100 mg by mouth at bedtime as needed for sleep. , Disp: , Rfl:   Allergies  Allergen Reactions  . Other Other (See Comments)    Reports all narcotics make him sick.    Review of Systems  Positive for right hand thumb pain as mentioned in HPI, otherwise all systems reviewed and are negative.  Objective  BP 122/96 mmHg  Pulse 117  Temp(Src) 98.5 F (36.9 C) (Oral)  Resp 18  Ht 5' 8.5" (1.74 m)  Wt 225 lb (102.059 kg)  BMI 33.71 kg/m2  SpO2 94%  Body mass index is 33.71 kg/(m^2).   Physical Exam  Constitutional: Patient appears well-developed and well-nourished. In no distress.   Cardiovascular: Normal rate, regular rhythm and normal heart sounds.  No murmur heard.  Pulmonary/Chest: Effort  normal and breath sounds normal. No respiratory distress. Musculoskeletal: Normal range of motion bilateral UE and LE, no joint effusions. Right hand 1st digit with PIP joint nodular changes with no other joint PIP/DIP/MCP hand joint changes. Peripheral vascular: Bilateral LE no edema. Neurological: CN II-XII grossly intact with no focal deficits. Alert and oriented to person, place, and time. Coordination, balance, strength, speech and gait are normal.  Skin: Skin is warm and dry. No rash noted. No erythema.  Psychiatric: Patient has a normal mood and affect. Behavior is normal in office today. Judgment and thought content normal in office today.    Assessment & Plan  1. Uncontrolled type 2 diabetes mellitus with peripheral circulatory disorder (HCC) Instructed patient to increase lantus to 50 units daily and change ISS to fixed 25 units of novolog with each meal. Call and report blood glucose readings.   - insulin aspart (  NOVOLOG FLEXPEN) 100 UNIT/ML FlexPen; Inject 25 Units into the skin 3 (three) times daily with meals.  Dispense: 8 pen; Refill: 5 - Insulin Glargine (LANTUS SOLOSTAR) 100 UNIT/ML Solostar Pen; Inject 50 Units into the skin daily at 10 pm.  Dispense: 5 pen; Refill: 5  2. Degenerative arthritis of thumb, right Rx for splint with copper lining as he has requested provided to him.

## 2015-07-11 ENCOUNTER — Ambulatory Visit: Payer: Medicaid Other | Admitting: Family Medicine

## 2015-07-17 ENCOUNTER — Other Ambulatory Visit: Payer: Self-pay | Admitting: Gastroenterology

## 2015-07-17 DIAGNOSIS — B182 Chronic viral hepatitis C: Secondary | ICD-10-CM

## 2015-07-19 ENCOUNTER — Ambulatory Visit: Payer: Medicaid Other

## 2015-07-19 ENCOUNTER — Encounter: Payer: Self-pay | Admitting: Family Medicine

## 2015-07-19 DIAGNOSIS — E1165 Type 2 diabetes mellitus with hyperglycemia: Secondary | ICD-10-CM

## 2015-07-19 DIAGNOSIS — E113299 Type 2 diabetes mellitus with mild nonproliferative diabetic retinopathy without macular edema, unspecified eye: Secondary | ICD-10-CM | POA: Insufficient documentation

## 2015-07-19 DIAGNOSIS — IMO0002 Reserved for concepts with insufficient information to code with codable children: Secondary | ICD-10-CM | POA: Insufficient documentation

## 2015-07-25 ENCOUNTER — Ambulatory Visit
Admission: RE | Admit: 2015-07-25 | Discharge: 2015-07-25 | Disposition: A | Discharge status: Home / Self Care | Payer: Medicaid Other | Source: Ambulatory Visit | Attending: Gastroenterology | Admitting: Gastroenterology

## 2015-07-25 DIAGNOSIS — K760 Fatty (change of) liver, not elsewhere classified: Secondary | ICD-10-CM | POA: Insufficient documentation

## 2015-07-25 DIAGNOSIS — R7989 Other specified abnormal findings of blood chemistry: Secondary | ICD-10-CM | POA: Diagnosis present

## 2015-07-25 DIAGNOSIS — B182 Chronic viral hepatitis C: Secondary | ICD-10-CM

## 2015-08-03 ENCOUNTER — Ambulatory Visit (INDEPENDENT_AMBULATORY_CARE_PROVIDER_SITE_OTHER): Payer: Medicaid Other | Admitting: Family Medicine

## 2015-08-03 ENCOUNTER — Ambulatory Visit
Admission: RE | Admit: 2015-08-03 | Discharge: 2015-08-03 | Disposition: A | Discharge status: Home / Self Care | Payer: Medicaid Other | Source: Ambulatory Visit | Attending: Family Medicine | Admitting: Family Medicine

## 2015-08-03 ENCOUNTER — Encounter: Payer: Self-pay | Admitting: Family Medicine

## 2015-08-03 ENCOUNTER — Ambulatory Visit: Payer: Medicaid Other

## 2015-08-03 VITALS — BP 126/92 | HR 114 | Temp 98.7°F | Resp 18 | Wt 232.7 lb

## 2015-08-03 DIAGNOSIS — M2550 Pain in unspecified joint: Secondary | ICD-10-CM | POA: Diagnosis not present

## 2015-08-03 DIAGNOSIS — R202 Paresthesia of skin: Secondary | ICD-10-CM

## 2015-08-03 DIAGNOSIS — E1140 Type 2 diabetes mellitus with diabetic neuropathy, unspecified: Secondary | ICD-10-CM | POA: Diagnosis not present

## 2015-08-03 DIAGNOSIS — R200 Anesthesia of skin: Secondary | ICD-10-CM

## 2015-08-03 DIAGNOSIS — M5412 Radiculopathy, cervical region: Secondary | ICD-10-CM

## 2015-08-03 DIAGNOSIS — R208 Other disturbances of skin sensation: Secondary | ICD-10-CM | POA: Diagnosis present

## 2015-08-03 DIAGNOSIS — G8929 Other chronic pain: Secondary | ICD-10-CM | POA: Diagnosis not present

## 2015-08-03 DIAGNOSIS — R52 Pain, unspecified: Secondary | ICD-10-CM | POA: Diagnosis not present

## 2015-08-03 MED ORDER — OXYCODONE-ACETAMINOPHEN 5-325 MG PO TABS: 1.0000 | ORAL_TABLET | Freq: Two times a day (BID) | ORAL | Status: DC | PRN

## 2015-08-03 NOTE — Progress Notes (Signed)
Name: William [name]   MRN: [id number]    DOB: 1955-12-11   Date:08/03/2015       Progress Note  Subjective  Chief Complaint  Chief Complaint  Patient presents with  . Neck Pain    patient presents with neck pain with radiation down towards to right hand with numbness and tingling.    HPI  [name] is a 59 year old male here with ongoing complaint of hand, arm pain. Initially he complained of of right hand 1st digit arthritis, stiffness, joint is turning. He requested a thumb brace and an RX was provided at our last visit 07/10/15. Today he reports he was not able to get the brace. He is considering [facility] to contact me regarding getting medical braces and or supportive mattress.  He is requesting new mattress due to him and his wife (who is also a patient of mine) suffering from chronic spinal and joint pain likely worsened by poor support from their current mattress. William [name] states he has right sided neck/trapezius pain with more progressive numbness in his hands (esp thumbs) and arms. The pain medication and gabapentin help with his knee issues but his arm symptoms seem to be progressing. He recalls having been told in the past he had degenerative disc disorder of his neck spine. He is also dropping items.   Past Medical History  Diagnosis Date  . Hypertension   . Diabetes mellitus without complication (HCC)   . Chronic hepatitis C without hepatic coma (HCC)   . History of cocaine use   . Depression   . Arthritis   . History of TIA (transient ischemic attack)   . Hyperlipidemia   . History of migraine headaches   . Obesity, Class II, BMI 35-39.9     Patient Active Problem List   Diagnosis Date Noted  . Uncontrolled type 2 diabetes mellitus with background retinopathy (HCC) 07/19/2015  . Degenerative arthritis of thumb 07/10/2015  . Chronic radicular cervical pain 06/08/2015  . Degeneration of lumbar/lumbosacral disc without myelopathy 06/08/2015  . Metabolic  syndrome 05/31/2015  . Knee pain 07/12/2014  . Balanitis 07/12/2014  . History of cocaine use 07/12/2014  . Depression 07/12/2014  . Chronic pain of multiple joints 07/12/2014  . Hx-TIA (transient ischemic attack) 07/12/2014  . Hx of migraine headaches 07/12/2014  . Obesity, Class I, BMI 30.0-34.9 (see actual BMI) 07/12/2014  . Cocaine abuse 07/12/2014  . Major depressive disorder with single episode (HCC) 07/12/2014  . Adiposity 07/12/2014  . Ache in joint 07/12/2014  . H/O disease 07/12/2014  . Personal history of transient ischemic attack (TIA), and cerebral infarction without residual deficits 07/12/2014  . Chronic hepatitis C (HCC) 07/11/2014  . Hyperlipidemia LDL goal <100 07/11/2014  . Chronic hepatitis C virus infection (HCC) 07/11/2014  . HLD (hyperlipidemia) 07/11/2014  . Uncontrolled type 2 diabetes mellitus with peripheral circulatory disorder (HCC) 12/02/2013  . Hypertension, goal below 140/90 12/02/2013  . Essential (primary) hypertension 12/02/2013  . Type 2 diabetes mellitus with peripheral angiopathy (HCC) 12/02/2013    Social History  Substance Use Topics  . Smoking status: Current Every Day Smoker -- 30 years    Types: Cigarettes    Last Attempt to Quit: 09/27/2014  . Smokeless tobacco: Not on file  . Alcohol Use: 0.0 oz/week    0 Standard drinks or equivalent per week     Current outpatient prescriptions:  .  amLODipine (NORVASC) 10 MG tablet, Take 1 tablet (10 mg total) by mouth daily.,  Disp: 90 tablet, Rfl: 1 .  cyclobenzaprine (FLEXERIL) 10 MG tablet, Take 1 tablet (10 mg total) by mouth 3 (three) times daily as needed for muscle spasms., Disp: 50 tablet, Rfl: 2 .  diphenhydrAMINE (BENADRYL) 25 MG tablet, Take 1 tablet (25 mg total) by mouth every 6 (six) hours. As needed with Compazine for headache., Disp: 20 tablet, Rfl: 0 .  Elastic Bandages & Supports (THUMB BRACE) MISC, 1 Device by Does not apply route daily as needed., Disp: 1 each, Rfl: 0 .   fluconazole (DIFLUCAN) 150 MG tablet, Take 1 tablet (150 mg total) by mouth every 7 (seven) days., Disp: 4 tablet, Rfl: 3 .  gabapentin (NEURONTIN) 100 MG capsule, Take 1 capsule (100 mg total) by mouth 3 (three) times daily., Disp: 90 capsule, Rfl: 3 .  glucose blood (ACCU-CHEK AVIVA) test strip, Use as instructed to check blood glucose 4x/day, Disp: 200 each, Rfl: 12 .  insulin aspart (NOVOLOG FLEXPEN) 100 UNIT/ML FlexPen, Inject 25 Units into the skin 3 (three) times daily with meals., Disp: 8 pen, Rfl: 5 .  Insulin Glargine (LANTUS SOLOSTAR) 100 UNIT/ML Solostar Pen, Inject 50 Units into the skin daily at 10 pm., Disp: 5 pen, Rfl: 5 .  Insulin Pen Needle (EASY TOUCH PEN NEEDLES) 32G X 6 MM MISC, Use with Flexpen to administer Insulin, Disp: 100 each, Rfl: 11 .  Lancets (FREESTYLE) lancets, Use as instructed to check blood glucose 4x/day, Disp: 200 each, Rfl: 12 .  metFORMIN (GLUCOPHAGE) 1000 MG tablet, Take 1 tablet (1,000 mg total) by mouth 2 (two) times daily with a meal., Disp: 180 tablet, Rfl: 1 .  metoCLOPramide (REGLAN) 10 MG tablet, Take 1 tablet (10 mg total) by mouth 3 (three) times daily as needed for nausea (headache / nausea)., Disp: 20 tablet, Rfl: 0 .  nystatin ointment (MYCOSTATIN), Apply 1 application topically 3 (three) times daily., Disp: 30 g, Rfl: 2 .  oxyCODONE-acetaminophen (PERCOCET/ROXICET) 5-325 MG per tablet, Take 1 tablet by mouth every 12 (twelve) hours as needed for severe pain., Disp: 60 tablet, Rfl: 0 .  prochlorperazine (COMPAZINE) 10 MG tablet, Take 1 tablet (10 mg total) by mouth 2 (two) times daily as needed (Headache). Take with Benadryl 25 mg., Disp: 15 tablet, Rfl: 0 .  traZODone (DESYREL) 100 MG tablet, Take 100 mg by mouth at bedtime as needed for sleep. , Disp: , Rfl:   Past Surgical History  Procedure Laterality Date  . Rotator  cuff surgery       left shoulder     Family History  Problem Relation Age of Onset  . Hypertension Mother   . Diabetes  Mother   . Heart disease Mother   . Thyroid disease Mother   . Hypertension Father   . Diabetes Father   . Heart disease Father     Allergies  Allergen Reactions  . Other Other (See Comments)    Reports all narcotics make him sick.     Review of Systems  CONSTITUTIONAL: No significant weight changes, fever, chills, weakness or fatigue.  HEENT:  - Eyes: No visual changes.  - Ears: No auditory changes. No pain.  - Nose: No sneezing, congestion, runny nose. - Throat: No sore throat. No changes in swallowing. SKIN: No rash or itching.  CARDIOVASCULAR: No chest pain, chest pressure or chest discomfort. No palpitations or edema.  RESPIRATORY: No shortness of breath, cough or sputum.  NEUROLOGICAL: No headache, dizziness, syncope, paralysis, ataxia. Yes numbness or tingling in the extremities. No  memory changes. No change in bowel or bladder control.  MUSCULOSKELETAL: Yes joint pain. No muscle pain.    Objective  Pulse 114  Temp(Src) 98.7 F (37.1 C) (Oral)  Resp 18  Wt 232 lb 11.2 oz (105.552 kg)  SpO2 95% Body mass index is 34.86 kg/(m^2).  Physical Exam  Constitutional: Patient is overweight and well-nourished. In no distress.  Neck: Normal range of motion. Neck supple. No JVD present. No thyromegaly present.  Cardiovascular: Normal rate, regular rhythm and normal heart sounds.  No murmur heard.  Pulmonary/Chest: Effort normal and breath sounds normal. No respiratory distress. Musculoskeletal: Normal range of motion bilateral UE and LE, no joint effusions. Cspine no palpable step off, full ROM, right trapezius muscle tension with positive Spurling sign to the right. Sensation intact with pinprick testing on UE with exception to loss of sensation over C6 distribution bilaterally. Peripheral vascular: Bilateral LE no edema. Neurological: CN II-XII grossly intact with no focal deficits. Alert and oriented to person, place, and time. Coordination, balance, strength, speech  and gait are normal.  Skin: Skin is warm and dry. No rash noted. No erythema.  Psychiatric: Patient has a normal mood and affect. Behavior is normal in office today. Judgment and thought content normal in office today.   Assessment & Plan  1. Cervical radiculopathy due to diabetes mellitus (HCC) I believe his poorly controled DM II is likely contributing to UE paresthesia however there may be concomitant spinal pathology in play. Plan is to get C-spine xray and consult with neurosurgery specialist.  - Ambulatory referral to Neurosurgery  2. Left upper extremity numbness  - Ambulatory referral to Neurosurgery - DG Cervical Spine Complete; Future  3. Right upper extremity numbness  - Ambulatory referral to Neurosurgery - DG Cervical Spine Complete; Future  4. Chronic pain of multiple joints Refilled  - oxyCODONE-acetaminophen (PERCOCET/ROXICET) 5-325 MG tablet; Take 1 tablet by mouth every 12 (twelve) hours as needed for severe pain.  Dispense: 60 tablet; Refill: 0 - Ambulatory referral to Neurosurgery

## 2015-08-06 ENCOUNTER — Telehealth: Payer: Self-pay | Admitting: Family Medicine

## 2015-08-06 ENCOUNTER — Telehealth: Payer: Self-pay

## 2015-08-06 NOTE — Telephone Encounter (Signed)
Patient was informed via voice mail.

## 2015-08-06 NOTE — Telephone Encounter (Signed)
Patient received call pertaining to his test results, however, he is having severe complications. Elease Hashimotoatricia stated that she has to prop him up in the bed, she rubs his shoulder down in ointment, she massages his shoulder and that there is a big knot on the right upper side of his shoulder. She states that he is not able to wait for several weeks for a referral appointment. Would like to know if there is a steroid shot or something that he can do to help with the pain. Please return call on Elease Hashimotoatricia cell [phone] because his number is acting up.

## 2015-08-06 NOTE — Telephone Encounter (Signed)
Patient called wanting to know about his results.

## 2015-08-06 NOTE — Telephone Encounter (Signed)
Let patient know I have already placed a referral to [facility] as his neck xray showed:  Various levels of his cervical spine shows degenerative changes and disc changes. No acute fracture or subluxation.

## 2015-08-07 ENCOUNTER — Other Ambulatory Visit: Payer: Self-pay | Admitting: Family Medicine

## 2015-08-07 MED ORDER — PREDNISONE 10 MG (21) PO TBPK: ORAL_TABLET | ORAL | Status: DC

## 2015-08-07 NOTE — Telephone Encounter (Signed)
Patient was informed via wife.

## 2015-08-07 NOTE — Telephone Encounter (Signed)
Prednisone steroid taper pack sent to [facility] pharmacy.

## 2015-08-08 ENCOUNTER — Ambulatory Visit: Payer: Medicaid Other | Admitting: Family Medicine

## 2015-08-08 ENCOUNTER — Telehealth: Payer: Self-pay | Admitting: Family Medicine

## 2015-08-08 NOTE — Telephone Encounter (Signed)
Patient's girlfriend called stating that William [name] was experiencing issues with his throat and that he was disoriented.  She stated that he wanted to speak with Dr. Debby FreibergSundaram's assistant because he felt as though his issues were due to his medication.  I informed patient that he needs to be seen.  I scheduled patient to be seen by Dr. Sherley BoundsSundaram on 08/08/15 @ 2:30pm.  Patient them called back and canceled the appointment and stated that he was feeling better and that he would just stay in bed and get some rest.

## 2015-08-09 ENCOUNTER — Emergency Department (HOSPITAL_COMMUNITY): Payer: Medicaid Other

## 2015-08-09 ENCOUNTER — Inpatient Hospital Stay (HOSPITAL_COMMUNITY)
Admission: EM | Admit: 2015-08-09 | Discharge: 2015-08-11 | DRG: 638 | Disposition: A | Discharge status: Home / Self Care | Payer: Medicaid Other | Attending: Internal Medicine | Admitting: Internal Medicine

## 2015-08-09 ENCOUNTER — Encounter (HOSPITAL_COMMUNITY): Payer: Self-pay | Admitting: General Practice

## 2015-08-09 DIAGNOSIS — E872 Acidosis: Secondary | ICD-10-CM | POA: Diagnosis present

## 2015-08-09 DIAGNOSIS — Z6835 Body mass index (BMI) 35.0-35.9, adult: Secondary | ICD-10-CM

## 2015-08-09 DIAGNOSIS — E876 Hypokalemia: Secondary | ICD-10-CM | POA: Diagnosis present

## 2015-08-09 DIAGNOSIS — Y92009 Unspecified place in unspecified non-institutional (private) residence as the place of occurrence of the external cause: Secondary | ICD-10-CM

## 2015-08-09 DIAGNOSIS — M25512 Pain in left shoulder: Secondary | ICD-10-CM | POA: Diagnosis present

## 2015-08-09 DIAGNOSIS — F1410 Cocaine abuse, uncomplicated: Secondary | ICD-10-CM | POA: Diagnosis present

## 2015-08-09 DIAGNOSIS — G43109 Migraine with aura, not intractable, without status migrainosus: Secondary | ICD-10-CM | POA: Diagnosis present

## 2015-08-09 DIAGNOSIS — I10 Essential (primary) hypertension: Secondary | ICD-10-CM

## 2015-08-09 DIAGNOSIS — Z885 Allergy status to narcotic agent status: Secondary | ICD-10-CM

## 2015-08-09 DIAGNOSIS — E1165 Type 2 diabetes mellitus with hyperglycemia: Principal | ICD-10-CM | POA: Diagnosis present

## 2015-08-09 DIAGNOSIS — Z794 Long term (current) use of insulin: Secondary | ICD-10-CM | POA: Diagnosis not present

## 2015-08-09 DIAGNOSIS — G4700 Insomnia, unspecified: Secondary | ICD-10-CM | POA: Diagnosis present

## 2015-08-09 DIAGNOSIS — Z8673 Personal history of transient ischemic attack (TIA), and cerebral infarction without residual deficits: Secondary | ICD-10-CM | POA: Diagnosis not present

## 2015-08-09 DIAGNOSIS — E871 Hypo-osmolality and hyponatremia: Secondary | ICD-10-CM | POA: Diagnosis present

## 2015-08-09 DIAGNOSIS — E1310 Other specified diabetes mellitus with ketoacidosis without coma: Secondary | ICD-10-CM | POA: Diagnosis not present

## 2015-08-09 DIAGNOSIS — E669 Obesity, unspecified: Secondary | ICD-10-CM | POA: Diagnosis present

## 2015-08-09 DIAGNOSIS — K449 Diaphragmatic hernia without obstruction or gangrene: Secondary | ICD-10-CM | POA: Diagnosis present

## 2015-08-09 DIAGNOSIS — B182 Chronic viral hepatitis C: Secondary | ICD-10-CM | POA: Diagnosis present

## 2015-08-09 DIAGNOSIS — Z888 Allergy status to other drugs, medicaments and biological substances status: Secondary | ICD-10-CM

## 2015-08-09 DIAGNOSIS — T380X5A Adverse effect of glucocorticoids and synthetic analogues, initial encounter: Secondary | ICD-10-CM | POA: Diagnosis present

## 2015-08-09 DIAGNOSIS — E1110 Type 2 diabetes mellitus with ketoacidosis without coma: Secondary | ICD-10-CM

## 2015-08-09 DIAGNOSIS — R0789 Other chest pain: Secondary | ICD-10-CM | POA: Diagnosis not present

## 2015-08-09 DIAGNOSIS — E119 Type 2 diabetes mellitus without complications: Secondary | ICD-10-CM | POA: Insufficient documentation

## 2015-08-09 DIAGNOSIS — K2970 Gastritis, unspecified, without bleeding: Secondary | ICD-10-CM | POA: Diagnosis present

## 2015-08-09 DIAGNOSIS — E785 Hyperlipidemia, unspecified: Secondary | ICD-10-CM | POA: Diagnosis present

## 2015-08-09 DIAGNOSIS — R079 Chest pain, unspecified: Secondary | ICD-10-CM

## 2015-08-09 DIAGNOSIS — E1151 Type 2 diabetes mellitus with diabetic peripheral angiopathy without gangrene: Secondary | ICD-10-CM | POA: Diagnosis present

## 2015-08-09 DIAGNOSIS — G8929 Other chronic pain: Secondary | ICD-10-CM | POA: Diagnosis present

## 2015-08-09 DIAGNOSIS — R410 Disorientation, unspecified: Secondary | ICD-10-CM

## 2015-08-09 DIAGNOSIS — F1010 Alcohol abuse, uncomplicated: Secondary | ICD-10-CM | POA: Insufficient documentation

## 2015-08-09 DIAGNOSIS — IMO0002 Reserved for concepts with insufficient information to code with codable children: Secondary | ICD-10-CM

## 2015-08-09 DIAGNOSIS — K219 Gastro-esophageal reflux disease without esophagitis: Secondary | ICD-10-CM | POA: Insufficient documentation

## 2015-08-09 DIAGNOSIS — F17210 Nicotine dependence, cigarettes, uncomplicated: Secondary | ICD-10-CM | POA: Diagnosis present

## 2015-08-09 DIAGNOSIS — R29898 Other symptoms and signs involving the musculoskeletal system: Secondary | ICD-10-CM | POA: Insufficient documentation

## 2015-08-09 LAB — BASIC METABOLIC PANEL
ANION GAP: 14 (ref 5–15)
ANION GAP: 13 (ref 5–15)
ANION GAP: 13 (ref 5–15)
ANION GAP: 8 (ref 5–15)
BUN: 28 mg/dL — ABNORMAL HIGH (ref 6–20)
BUN: 23 mg/dL — ABNORMAL HIGH (ref 6–20)
BUN: 23 mg/dL — ABNORMAL HIGH (ref 6–20)
BUN: 22 mg/dL — ABNORMAL HIGH (ref 6–20)
CO2: 18 mmol/L — AB (ref 22–32)
CO2: 21 mmol/L — ABNORMAL LOW (ref 22–32)
CO2: 22 mmol/L (ref 22–32)
CO2: 21 mmol/L — AB (ref 22–32)
Calcium: 9.7 mg/dL (ref 8.9–10.3)
Calcium: 10.1 mg/dL (ref 8.9–10.3)
Calcium: 10 mg/dL (ref 8.9–10.3)
Calcium: 9.1 mg/dL (ref 8.9–10.3)
Chloride: 100 mmol/L — ABNORMAL LOW (ref 101–111)
Chloride: 103 mmol/L (ref 101–111)
Chloride: 102 mmol/L (ref 101–111)
Chloride: 106 mmol/L (ref 101–111)
Creatinine, Ser: 1.41 mg/dL — ABNORMAL HIGH (ref 0.61–1.24)
Creatinine, Ser: 1.08 mg/dL (ref 0.61–1.24)
Creatinine, Ser: 1.07 mg/dL (ref 0.61–1.24)
Creatinine, Ser: 1.02 mg/dL (ref 0.61–1.24)
GFR calc Af Amer: >60 mL/min (ref >60)
GFR calc Af Amer: >60 mL/min (ref >60)
GFR calc non Af Amer: 53 mL/min — ABNORMAL LOW (ref >60)
GLUCOSE: 368 mg/dL — AB (ref 65–99)
GLUCOSE: 144 mg/dL — AB (ref 65–99)
GLUCOSE: 174 mg/dL — AB (ref 65–99)
Glucose, Bld: 212 mg/dL — ABNORMAL HIGH (ref 65–99)
POTASSIUM: 4 mmol/L (ref 3.5–5.1)
POTASSIUM: 4 mmol/L (ref 3.5–5.1)
POTASSIUM: 3.7 mmol/L (ref 3.5–5.1)
POTASSIUM: 4 mmol/L (ref 3.5–5.1)
Sodium: 132 mmol/L — ABNORMAL LOW (ref 135–145)
Sodium: 137 mmol/L (ref 135–145)
Sodium: 137 mmol/L (ref 135–145)
Sodium: 135 mmol/L (ref 135–145)

## 2015-08-09 LAB — CBC WITH DIFFERENTIAL/PLATELET
Basophils Absolute: 0 10*3/uL (ref 0.0–0.1)
Basophils Relative: 0 %
EOS ABS: 0 10*3/uL (ref 0.0–0.7)
EOS PCT: 0 %
HCT: 50.7 % (ref 39.0–52.0)
Hemoglobin: 17.3 g/dL — ABNORMAL HIGH (ref 13.0–17.0)
LYMPHS ABS: 2.5 10*3/uL (ref 0.7–4.0)
Lymphocytes Relative: 23 %
MCH: 30.7 pg (ref 26.0–34.0)
MCHC: 34.1 g/dL (ref 30.0–36.0)
MCV: 89.9 fL (ref 78.0–100.0)
MONO ABS: 0.6 10*3/uL (ref 0.1–1.0)
MONOS PCT: 6 %
Neutro Abs: 7.9 10*3/uL — ABNORMAL HIGH (ref 1.7–7.7)
Neutrophils Relative %: 71 %
PLATELETS: 195 10*3/uL (ref 150–400)
RBC: 5.64 MIL/uL (ref 4.22–5.81)
RDW: 14.1 % (ref 11.5–15.5)
WBC: 11 10*3/uL — AB (ref 4.0–10.5)

## 2015-08-09 LAB — I-STAT ARTERIAL BLOOD GAS, ED
ACID-BASE DEFICIT: 9 mmol/L — AB (ref 0.0–2.0)
BICARBONATE: 16.6 meq/L — AB (ref 20.0–24.0)
O2 Saturation: 94 %
PCO2 ART: 35.6 mmHg (ref 35.0–45.0)
PH ART: 7.278 — AB (ref 7.350–7.450)
PO2 ART: 78 mmHg — AB (ref 80.0–100.0)
Patient temperature: 98.6
TCO2: 18 mmol/L (ref 0–100)

## 2015-08-09 LAB — URINE MICROSCOPIC-ADD ON

## 2015-08-09 LAB — I-STAT CHEM 8, ED
BUN: 42 mg/dL — AB (ref 6–20)
CREATININE: 1.3 mg/dL — AB (ref 0.61–1.24)
Calcium, Ion: 1.35 mmol/L — ABNORMAL HIGH (ref 1.13–1.30)
Chloride: 92 mmol/L — ABNORMAL LOW (ref 101–111)
HCT: 60 % — ABNORMAL HIGH (ref 39.0–52.0)
HEMOGLOBIN: 20.4 g/dL — AB (ref 13.0–17.0)
Potassium: 6 mmol/L — ABNORMAL HIGH (ref 3.5–5.1)
Sodium: 124 mmol/L — ABNORMAL LOW (ref 135–145)
TCO2: 17 mmol/L (ref 0–100)

## 2015-08-09 LAB — COMPREHENSIVE METABOLIC PANEL
ALT: 246 U/L — ABNORMAL HIGH (ref 17–63)
ANION GAP: 25 — AB (ref 5–15)
AST: 52 U/L — ABNORMAL HIGH (ref 15–41)
Albumin: 4.6 g/dL (ref 3.5–5.0)
Alkaline Phosphatase: 104 U/L (ref 38–126)
BUN: 35 mg/dL — ABNORMAL HIGH (ref 6–20)
CHLORIDE: 83 mmol/L — AB (ref 101–111)
CO2: 16 mmol/L — AB (ref 22–32)
Calcium: 11.6 mg/dL — ABNORMAL HIGH (ref 8.9–10.3)
Creatinine, Ser: 1.84 mg/dL — ABNORMAL HIGH (ref 0.61–1.24)
GFR, EST AFRICAN AMERICAN: 44 mL/min — AB (ref >60)
GFR, EST NON AFRICAN AMERICAN: 38 mL/min — AB (ref >60)
Glucose, Bld: 863 mg/dL (ref 65–99)
POTASSIUM: 6.1 mmol/L — AB (ref 3.5–5.1)
SODIUM: 124 mmol/L — AB (ref 135–145)
Total Bilirubin: 1.7 mg/dL — ABNORMAL HIGH (ref 0.3–1.2)
Total Protein: 9.1 g/dL — ABNORMAL HIGH (ref 6.5–8.1)

## 2015-08-09 LAB — PROTIME-INR
INR: 1.23 (ref 0.00–1.49)
Prothrombin Time: 15.7 seconds — ABNORMAL HIGH (ref 11.6–15.2)

## 2015-08-09 LAB — GLUCOSE, CAPILLARY
GLUCOSE-CAPILLARY: 125 mg/dL — AB (ref 65–99)
GLUCOSE-CAPILLARY: 144 mg/dL — AB (ref 65–99)
GLUCOSE-CAPILLARY: 181 mg/dL — AB (ref 65–99)
Glucose-Capillary: 340 mg/dL — ABNORMAL HIGH (ref 65–99)
Glucose-Capillary: 273 mg/dL — ABNORMAL HIGH (ref 65–99)
Glucose-Capillary: 211 mg/dL — ABNORMAL HIGH (ref 65–99)
Glucose-Capillary: 113 mg/dL — ABNORMAL HIGH (ref 65–99)
Glucose-Capillary: 181 mg/dL — ABNORMAL HIGH (ref 65–99)

## 2015-08-09 LAB — URINALYSIS, ROUTINE W REFLEX MICROSCOPIC
BILIRUBIN URINE: NEGATIVE
HGB URINE DIPSTICK: NEGATIVE
Ketones, ur: 40 mg/dL — AB
LEUKOCYTES UA: NEGATIVE
NITRITE: NEGATIVE
Protein, ur: NEGATIVE mg/dL
Specific Gravity, Urine: 1.029 (ref 1.005–1.030)
Urobilinogen, UA: 0.2 mg/dL (ref 0.0–1.0)
pH: 5 (ref 5.0–8.0)

## 2015-08-09 LAB — RAPID URINE DRUG SCREEN, HOSP PERFORMED
AMPHETAMINES: NOT DETECTED
BARBITURATES: NOT DETECTED
BENZODIAZEPINES: NOT DETECTED
COCAINE: POSITIVE — AB
Opiates: NOT DETECTED
Tetrahydrocannabinol: NOT DETECTED

## 2015-08-09 LAB — MRSA PCR SCREENING: MRSA BY PCR: NEGATIVE

## 2015-08-09 LAB — TROPONIN I
TROPONIN I: 0.03 ng/mL (ref <0.031)
Troponin I: <0.03 ng/mL (ref <0.031)

## 2015-08-09 LAB — CBG MONITORING, ED
Glucose-Capillary: 573 mg/dL (ref 65–99)
Glucose-Capillary: 504 mg/dL — ABNORMAL HIGH (ref 65–99)

## 2015-08-09 LAB — I-STAT CG4 LACTIC ACID, ED
Lactic Acid, Venous: 5.26 mmol/L (ref 0.5–2.0)
Lactic Acid, Venous: 2.74 mmol/L (ref 0.5–2.0)

## 2015-08-09 LAB — I-STAT TROPONIN, ED: TROPONIN I, POC: 0.01 ng/mL (ref 0.00–0.08)

## 2015-08-09 LAB — MAGNESIUM: MAGNESIUM: 2.3 mg/dL (ref 1.7–2.4)

## 2015-08-09 LAB — PHOSPHORUS: PHOSPHORUS: 1.8 mg/dL — AB (ref 2.5–4.6)

## 2015-08-09 LAB — ETHANOL: Alcohol, Ethyl (B): <5 mg/dL (ref <5)

## 2015-08-09 LAB — APTT: APTT: 30 s (ref 24–37)

## 2015-08-09 MED ORDER — SODIUM CHLORIDE 0.9 % IV SOLN
8.0000 mg | Freq: Once | INTRAVENOUS | Status: DC
  Filled 2015-08-09: qty 4

## 2015-08-09 MED ORDER — SODIUM CHLORIDE 0.9 % IV BOLUS (SEPSIS)
1000.0000 mL | Freq: Once | INTRAVENOUS | Status: AC
  Administered 2015-08-09: 1000 mL via INTRAVENOUS

## 2015-08-09 MED ORDER — MORPHINE SULFATE (PF) 2 MG/ML IV SOLN: 2.0000 mg | Freq: Once | INTRAVENOUS | Status: DC

## 2015-08-09 MED ORDER — SODIUM CHLORIDE 0.9 % IV SOLN
1000.0000 mL | INTRAVENOUS | Status: DC
  Administered 2015-08-09: 1000 mL via INTRAVENOUS

## 2015-08-09 MED ORDER — SODIUM CHLORIDE 0.9 % IV SOLN: INTRAVENOUS | Status: AC

## 2015-08-09 MED ORDER — GABAPENTIN 100 MG PO CAPS
100.0000 mg | ORAL_CAPSULE | Freq: Three times a day (TID) | ORAL | Status: DC
  Administered 2015-08-09 – 2015-08-11 (×6): 100 mg via ORAL
  Filled 2015-08-09 (×8): qty 1

## 2015-08-09 MED ORDER — SODIUM BICARBONATE 8.4 % IV SOLN
50.0000 meq | Freq: Once | INTRAVENOUS | Status: AC
  Administered 2015-08-09: 50 meq via INTRAVENOUS
  Filled 2015-08-09: qty 50

## 2015-08-09 MED ORDER — ONDANSETRON HCL 4 MG PO TABS: 4.0000 mg | ORAL_TABLET | Freq: Four times a day (QID) | ORAL | Status: DC | PRN

## 2015-08-09 MED ORDER — ONDANSETRON HCL 4 MG/2ML IJ SOLN
4.0000 mg | Freq: Four times a day (QID) | INTRAMUSCULAR | Status: DC | PRN
  Administered 2015-08-10: 4 mg via INTRAVENOUS
  Filled 2015-08-09: qty 2

## 2015-08-09 MED ORDER — OXYCODONE-ACETAMINOPHEN 5-325 MG PO TABS
1.0000 | ORAL_TABLET | Freq: Two times a day (BID) | ORAL | Status: DC | PRN
  Administered 2015-08-10: 1 via ORAL
  Filled 2015-08-09: qty 1

## 2015-08-09 MED ORDER — INSULIN REGULAR HUMAN 100 UNIT/ML IJ SOLN
INTRAMUSCULAR | Status: DC
  Administered 2015-08-09: 5.4 [IU]/h via INTRAVENOUS
  Filled 2015-08-09: qty 2.5

## 2015-08-09 MED ORDER — ONDANSETRON HCL 4 MG/2ML IJ SOLN: 4.0000 mg | Freq: Four times a day (QID) | INTRAMUSCULAR | Status: DC | PRN

## 2015-08-09 MED ORDER — SODIUM CHLORIDE 0.9 % IV SOLN
INTRAVENOUS | Status: DC
  Administered 2015-08-09: 8.4 [IU]/h via INTRAVENOUS
  Filled 2015-08-09: qty 2.5

## 2015-08-09 MED ORDER — DEXTROSE-NACL 5-0.45 % IV SOLN
INTRAVENOUS | Status: DC
  Administered 2015-08-09: 125 mL/h via INTRAVENOUS

## 2015-08-09 MED ORDER — SODIUM CHLORIDE 0.9 % IV SOLN
INTRAVENOUS | Status: DC
  Administered 2015-08-09: 15:00:00 via INTRAVENOUS

## 2015-08-09 MED ORDER — DIAZEPAM 5 MG/ML IJ SOLN: 5.0000 mg | INTRAMUSCULAR | Status: DC | PRN

## 2015-08-09 MED ORDER — AMLODIPINE BESYLATE 10 MG PO TABS
10.0000 mg | ORAL_TABLET | Freq: Every day | ORAL | Status: DC
  Administered 2015-08-10: 10 mg via ORAL
  Filled 2015-08-09: qty 1

## 2015-08-09 MED ORDER — TRAZODONE HCL 100 MG PO TABS
100.0000 mg | ORAL_TABLET | Freq: Every evening | ORAL | Status: DC | PRN
  Administered 2015-08-10: 100 mg via ORAL
  Filled 2015-08-09: qty 1

## 2015-08-09 MED ORDER — ACETAMINOPHEN 500 MG PO TABS
1000.0000 mg | ORAL_TABLET | Freq: Once | ORAL | Status: AC
  Administered 2015-08-09: 1000 mg via ORAL
  Filled 2015-08-09: qty 2

## 2015-08-09 MED ORDER — GI COCKTAIL ~~LOC~~
30.0000 mL | Freq: Four times a day (QID) | ORAL | Status: DC | PRN
  Administered 2015-08-09 – 2015-08-10 (×3): 30 mL via ORAL
  Filled 2015-08-09 (×3): qty 30

## 2015-08-09 MED ORDER — SODIUM CHLORIDE 0.9 % IV SOLN
1000.0000 mL | Freq: Once | INTRAVENOUS | Status: AC
  Administered 2015-08-09: 1000 mL via INTRAVENOUS

## 2015-08-09 MED ORDER — CYCLOBENZAPRINE HCL 10 MG PO TABS: 10.0000 mg | ORAL_TABLET | Freq: Three times a day (TID) | ORAL | Status: DC | PRN

## 2015-08-09 MED ORDER — ENOXAPARIN SODIUM 40 MG/0.4ML ~~LOC~~ SOLN
40.0000 mg | SUBCUTANEOUS | Status: DC
  Administered 2015-08-09 – 2015-08-10 (×2): 40 mg via SUBCUTANEOUS
  Filled 2015-08-09 (×2): qty 0.4

## 2015-08-09 MED ORDER — SODIUM CHLORIDE 0.9 % IV SOLN
INTRAVENOUS | Status: DC
  Administered 2015-08-09: 14:00:00 via INTRAVENOUS

## 2015-08-09 MED ORDER — SODIUM CHLORIDE 0.9 % IJ SOLN
3.0000 mL | Freq: Two times a day (BID) | INTRAMUSCULAR | Status: DC
  Administered 2015-08-09 – 2015-08-10 (×4): 3 mL via INTRAVENOUS

## 2015-08-09 MED ORDER — DIPHENHYDRAMINE HCL 50 MG/ML IJ SOLN
25.0000 mg | Freq: Once | INTRAMUSCULAR | Status: AC
  Administered 2015-08-09: 25 mg via INTRAVENOUS
  Filled 2015-08-09: qty 1

## 2015-08-09 MED ORDER — ACETAMINOPHEN 325 MG PO TABS
650.0000 mg | ORAL_TABLET | ORAL | Status: DC | PRN
  Administered 2015-08-09 – 2015-08-10 (×3): 650 mg via ORAL
  Filled 2015-08-09 (×3): qty 2

## 2015-08-09 NOTE — ED Notes (Addendum)
Pt presents to the ED with complaints of confusion since Friday 08/03/15. Pts wife reports pt has been acting very strange lately. Wife is reporting pt has been spilling items, forgetful, and difficulty swallowing. Pt is A/O x's 4. Wife also reporting pt was complaining of a bad frontal headache yesterday. Pt reporting neck and back pain, pt is scheduled for an MRI today.

## 2015-08-09 NOTE — H&P (Signed)
[facility] History and Physical  William [name] NWG:[id number] DOB: [birth year] DOA: 08/09/2015  Referring physician: Dr Manus Gunning - [facility] PCP: Edwena Felty, MD   Chief Complaint: HA  HPI: William [name] is a 59 y.o. male  7-10 days of actinic and left shoulder pain. Fairly constant. Patient follow-up with his primary care physician 3 days ago who prescribed him a steroid Dosepak to treat his muscle skeletal type pain. After starting the steroids patient states that he began to stumble as he walked, become more forgetful, drop things out of his hand and developed headache. States that he had a similar reaction to steroids one year ago and forgot about this prior to starting his current course of steroids. Patient with chronic migraines and states that this headache is similar to those which he also developed aura. Aura typically manifests with unilateral upper and lower extremity weakness. Reglan at home without relief of headache. Headache is constant with waxing and waning nature. Patient also with chest pain 3 days which comes and goes and is nonexertional. Does not radiate. Slowly resolving.   Review of Systems:  Constitutional:  No weight loss, night sweats, Fevers, chills HEENT:  No Difficulty swallowing,Tooth/dental problems,Sore throat, Cardio-vascular:  No Orthopnea, PND, swelling in lower extremities, anasarca, dizziness, palpitations  GI:  No heartburn, indigestion, abdominal pain, nausea, vomiting, diarrhea, change in bowel habits, loss of appetite  Resp:   No shortness of breath with exertion or at rest. No excess mucus, no productive cough, No non-productive cough, No coughing up of blood.No change in color of mucus.No wheezing.No chest wall deformity  Skin:  no rash or lesions.  GU:  no dysuria, change in color of urine, no urgency or frequency. No flank pain.  Musculoskeletal:  Per HPI Psych:  No change in mood or affect. No depression or anxiety. No memory loss.    Neuro:  No change in sensation, unilateral strength, or cognitive abilities  All other systems were reviewed and are negative.  Past Medical History  Diagnosis Date  . Hypertension   . Diabetes mellitus without complication (HCC)   . Chronic hepatitis C without hepatic coma (HCC)   . History of cocaine use   . Depression   . Arthritis   . History of TIA (transient ischemic attack)   . Hyperlipidemia   . History of migraine headaches   . Obesity, Class II, BMI 35-39.9    Past Surgical History  Procedure Laterality Date  . Rotator  cuff surgery       left shoulder    Social History:  reports that he has been smoking Cigarettes.  He has a 3 pack-year smoking history. He does not have any smokeless tobacco history on file. He reports that he drinks alcohol. He reports that he uses illicit drugs.  Allergies  Allergen Reactions  . Other Other (See Comments)    Reports all narcotics make him sick.  . Prednisone     Disoriented     Family History  Problem Relation Age of Onset  . Hypertension Mother   . Diabetes Mother   . Heart disease Mother   . Thyroid disease Mother   . Hypertension Father   . Diabetes Father   . Heart disease Father      Prior to Admission medications   Medication Sig Start Date End Date Taking? Authorizing Provider  diphenhydrAMINE (BENADRYL) 25 MG tablet Take 1 tablet (25 mg total) by mouth every 6 (six) hours. As needed with Compazine for headache. 10/27/14  Yes Arby BarretteMarcy Pfeiffer, MD  nystatin ointment (MYCOSTATIN) Apply 1 application topically 3 (three) times daily. 05/30/15  Yes Edwena FeltyAshany Sundaram, MD  traZODone (DESYREL) 100 MG tablet Take 100 mg by mouth at bedtime as needed for sleep.  09/06/14  Yes Historical Provider, MD  amLODipine (NORVASC) 10 MG tablet Take 1 tablet (10 mg total) by mouth daily. 04/05/15   Edwena FeltyAshany Sundaram, MD  cyclobenzaprine (FLEXERIL) 10 MG tablet Take 1 tablet (10 mg total) by mouth 3 (three) times daily as needed for muscle  spasms. 05/30/15   Edwena FeltyAshany Sundaram, MD  Elastic Bandages & Supports (THUMB BRACE) MISC 1 Device by Does not apply route daily as needed. 07/10/15   Edwena FeltyAshany Sundaram, MD  gabapentin (NEURONTIN) 100 MG capsule Take 1 capsule (100 mg total) by mouth 3 (three) times daily. 05/30/15   Edwena FeltyAshany Sundaram, MD  glucose blood (ACCU-CHEK AVIVA) test strip Use as instructed to check blood glucose 4x/day 05/30/15   Edwena FeltyAshany Sundaram, MD  insulin aspart (NOVOLOG FLEXPEN) 100 UNIT/ML FlexPen Inject 25 Units into the skin 3 (three) times daily with meals. 07/10/15   Edwena FeltyAshany Sundaram, MD  Insulin Glargine (LANTUS SOLOSTAR) 100 UNIT/ML Solostar Pen Inject 50 Units into the skin daily at 10 pm. Patient taking differently: Inject 45 Units into the skin daily at 10 pm.  07/10/15   Edwena FeltyAshany Sundaram, MD  Insulin Pen Needle (EASY TOUCH PEN NEEDLES) 32G X 6 MM MISC Use with Flexpen to administer Insulin 08/04/14   Radhika P Phadke, MD  Lancets (FREESTYLE) lancets Use as instructed to check blood glucose 4x/day 05/30/15   Edwena FeltyAshany Sundaram, MD  metFORMIN (GLUCOPHAGE) 1000 MG tablet Take 1 tablet (1,000 mg total) by mouth 2 (two) times daily with a meal. 04/05/15   Edwena FeltyAshany Sundaram, MD  metoCLOPramide (REGLAN) 10 MG tablet Take 1 tablet (10 mg total) by mouth 3 (three) times daily as needed for nausea (headache / nausea). 12/22/14   Garlon HatchetLisa M Sanders, PA-C  oxyCODONE-acetaminophen (PERCOCET/ROXICET) 5-325 MG tablet Take 1 tablet by mouth every 12 (twelve) hours as needed for severe pain. 08/03/15   Edwena FeltyAshany Sundaram, MD  predniSONE (STERAPRED UNI-PAK 21 TAB) 10 MG (21) TBPK tablet Use as directed in a 6 day taper prednisone pack. Patient not taking: Reported on 08/09/2015 08/07/15   Edwena FeltyAshany Sundaram, MD  prochlorperazine (COMPAZINE) 10 MG tablet Take 1 tablet (10 mg total) by mouth 2 (two) times daily as needed (Headache). Take with Benadryl 25 mg. 10/27/14   Arby BarretteMarcy Pfeiffer, MD   Physical Exam: Filed Vitals:   08/09/15 1115 08/09/15 1130 08/09/15 1140  08/09/15 1141  BP: 152/89 133/82 133/84 137/90  Pulse: 110 110    Temp:      TempSrc:      Resp: 21 16    Height:      Weight:      SpO2: 99% 97%      Wt Readings from Last 3 Encounters:  08/09/15 105.688 kg (233 lb)  08/03/15 105.552 kg (232 lb 11.2 oz)  07/10/15 102.059 kg (225 lb)    General:  Appears calm and comfortable Eyes:  PERRL, EOMI, normal lids, iris ENT:  Dry mm Neck:  no LAD, masses or thyromegaly Cardiovascular:  RRR, no m/r/g. No LE edema.  Respiratory:  CTA bilaterally, no w/r/r. Normal respiratory effort. Abdomen:  soft, ntnd Skin:  no rash or induration seen on limited exam Musculoskeletal:  grossly normal tone BUE/BLE Psychiatric:  grossly normal mood and affect, speech fluent and appropriate Neurologic:  CN 2-12 grossly  intact, moves all extremities in coordinated fashion. left and right upper and lower extremities 5 out of 5 strength.           Labs on Admission:  Basic Metabolic Panel:  Recent Labs Lab 08/09/15 0930 08/09/15 1016  NA 124* 124*  K 6.1* 6.0*  CL 83* 92*  CO2 16*  --   GLUCOSE 863* >700*  BUN 35* 42*  CREATININE 1.84* 1.30*  CALCIUM 11.6*  --    Liver Function Tests:  Recent Labs Lab 08/09/15 0930  AST 52*  ALT 246*  ALKPHOS 104  BILITOT 1.7*  PROT 9.1*  ALBUMIN 4.6   No results for input(s): LIPASE, AMYLASE in the last 168 hours. No results for input(s): AMMONIA in the last 168 hours. CBC:  Recent Labs Lab 08/09/15 0930 08/09/15 1016  WBC 11.0*  --   NEUTROABS 7.9*  --   HGB 17.3* 20.4*  HCT 50.7 60.0*  MCV 89.9  --   PLT 195  --    Cardiac Enzymes: No results for input(s): CKTOTAL, CKMB, CKMBINDEX, TROPONINI in the last 168 hours.  BNP (last 3 results) No results for input(s): BNP in the last 8760 hours.  ProBNP (last 3 results) No results for input(s): PROBNP in the last 8760 hours.  CBG:  Recent Labs Lab 08/09/15 0959 08/09/15 1103 08/09/15 1216 08/09/15 1323  GLUCAP >600* >600* 573*  504*    Radiological Exams on Admission: Ct Head Wo Contrast  08/09/2015  CLINICAL DATA:  Confusion and headache; slurred speech for 1 day EXAM: CT HEAD WITHOUT CONTRAST TECHNIQUE: Contiguous axial images were obtained from the base of the skull through the vertex without intravenous contrast. COMPARISON:  November 30, 2014 FINDINGS: The ventricles are normal in size and configuration. There is no intracranial mass, hemorrhage, extra-axial fluid collection, or midline shift. There is stable patchy small vessel disease in the centra semiovale bilaterally. Elsewhere gray-white compartments appear normal. No acute infarct apparent. The bony calvarium appears intact. The mastoid air cells are clear. IMPRESSION: Patchy periventricular small vessel disease, essentially stable. No new gray-white compartment lesions. No acute infarct evident. No hemorrhage or mass effect. Electronically Signed   By: Bretta Bang III M.D.   On: 08/09/2015 12:26   Dg Chest Portable 1 View  08/09/2015  CLINICAL DATA:  Left-sided weakness.  Hypertension. EXAM: PORTABLE CHEST 1 VIEW COMPARISON:  None. FINDINGS: The lungs are clear. The heart size and pulmonary vascularity are normal. No adenopathy. There is degenerative change in the lower thoracic spine. IMPRESSION: No edema or consolidation. Electronically Signed   By: Bretta Bang III M.D.   On: 08/09/2015 10:40    EKG: Independently reviewed.   Assessment/Plan Principal Problem:   DKA (diabetic ketoacidoses) (HCC) Active Problems:   Cocaine abuse   Chest pain   Hyponatremia   Essential hypertension   Migraine with aura   Insomnia   Chronic pain  DKA: ABG showing pH 7.27, PCO2 35, PO2 78, bicarbonate 16.6, lactic acid 5.26, WBC 11, glucose 863. Anion gap 25 (with corrected Na its 40). Likely induced from steroid use for shoulder pain. States home glucose in the 300-400 range recently - stepdown - glucose stabilizer - trend lactic acid - stop  steroids  Migraine w/ aura: chronic condition for pt. Aura manifestations typically in L sided weakness. Likely set off by DKA. Head CT negative and symptoms of weakness resolving - Zofran, Benadryl, tylenol - resolve DKA  Hyponatremia: 124. 142 after correcting for hyperglycemia.  -  IVF - treatment as above  CP: likely related to DKA but cannot exclude ACS. No concern for dissection at this time as back pain likely MSK. CP nearly resolved at this time. Trop neg. EKG w/o ACS. - Cycle trop - EKG in am  Drug use: Cocaine positive on admission. Patient states that he has been "clean for years. " Patient also states that he is unsure of whether or not we have done drugs over the last couple of days as he has no clear recollection of this. Denies alcohol use as well - Alcohol level - CIWA  HTN: - continue home norvasc,   L sided weakness: likely aura. Intermittent and resolving.  - See above  MSK/Neuropathic pain: - continue home flexeril, percocet, neurontin  Insomnia: - continue trazodone  Code Status: FULL  DVT Prophylaxis: Lovenox Family Communication: none Disposition Plan:  Pending Improvement    Soo Steelman J, MD Family Medicine [facility] [email or web address] Password [email or web address]

## 2015-08-09 NOTE — ED Notes (Signed)
Attempted report Xs 1.  

## 2015-08-09 NOTE — ED Provider Notes (Signed)
CSN: [id number]     Arrival date & time 08/09/15  [id number] History   First MD Initiated Contact with Patient 08/09/15 [phone]     Chief Complaint  Patient presents with  . Stroke Symptoms     (Consider location/radiation/quality/duration/timing/severity/associated sxs/prior Treatment) HPI   William [name] is a 59 y.o. male, pt with history of TIA, HTN, and DM, presenting to ED with confusion, unilateral weakness, headache, and difficulty breathing for the past week. Pt significant other at bedside states that over the past week pt has been forgetful, dropping objects, wandering around, and sleeping almost all the time. Pt was given prednisone yesterday for a separate neck/back pain issue and wife states after he took the first dose, pt symptoms from the past week began to worsen and were then accompanied by shortness of breath and dysphagia. Wife states pt was complaining of a "bad" frontal headache yesterday as well. Pt currently denies shortness of breath but admits to nausea. Denies current chest pain. Denies pain radiating to his back.  Current back and neck pain is chronic. Pt has not taken any medications today. Wife states pt now seems oriented to his normal level, but is slower to respond than normal.     Past Medical History  Diagnosis Date  . Hypertension   . Diabetes mellitus without complication (HCC)   . Chronic hepatitis C without hepatic coma (HCC)   . History of cocaine use   . Depression   . Arthritis   . History of TIA (transient ischemic attack)   . Hyperlipidemia   . History of migraine headaches   . Obesity, Class II, BMI 35-39.9    Past Surgical History  Procedure Laterality Date  . Rotator  cuff surgery       left shoulder    Family History  Problem Relation Age of Onset  . Hypertension Mother   . Diabetes Mother   . Heart disease Mother   . Thyroid disease Mother   . Hypertension Father   . Diabetes Father   . Heart disease Father    Social History   Substance Use Topics  . Smoking status: Current Every Day Smoker -- 0.10 packs/day for 30 years    Types: Cigarettes    Last Attempt to Quit: 09/27/2014  . Smokeless tobacco: None  . Alcohol Use: 0.0 oz/week    0 Standard drinks or equivalent per week    Review of Systems  Constitutional: Negative for fever, chills, diaphoresis and unexpected weight change.  Respiratory: Negative for cough, chest tightness and shortness of breath.   Cardiovascular: Negative for chest pain, palpitations and leg swelling.  Gastrointestinal: Negative for nausea, vomiting, abdominal pain, diarrhea and constipation.  Genitourinary: Negative for dysuria and flank pain.  Musculoskeletal: Positive for back pain and neck pain.  Skin: Negative for color change and pallor.  Neurological: Positive for weakness. Negative for dizziness, tremors, syncope, light-headedness, numbness and headaches.  All other systems reviewed and are negative.     Allergies  Other and Prednisone  Home Medications   Prior to Admission medications   Medication Sig Start Date End Date Taking? Authorizing Provider  diphenhydrAMINE (BENADRYL) 25 MG tablet Take 1 tablet (25 mg total) by mouth every 6 (six) hours. As needed with Compazine for headache. 10/27/14  Yes Arby BarretteMarcy Pfeiffer, MD  nystatin ointment (MYCOSTATIN) Apply 1 application topically 3 (three) times daily. 05/30/15  Yes Edwena FeltyAshany Sundaram, MD  traZODone (DESYREL) 100 MG tablet Take 100 mg by mouth at bedtime as  needed for sleep.  09/06/14  Yes Historical Provider, MD  amLODipine (NORVASC) 10 MG tablet Take 1 tablet (10 mg total) by mouth daily. 04/05/15   Edwena Felty, MD  cyclobenzaprine (FLEXERIL) 10 MG tablet Take 1 tablet (10 mg total) by mouth 3 (three) times daily as needed for muscle spasms. 05/30/15   Edwena Felty, MD  Elastic Bandages & Supports (THUMB BRACE) MISC 1 Device by Does not apply route daily as needed. 07/10/15   Edwena Felty, MD  gabapentin (NEURONTIN)  100 MG capsule Take 1 capsule (100 mg total) by mouth 3 (three) times daily. 05/30/15   Edwena Felty, MD  glucose blood (ACCU-CHEK AVIVA) test strip Use as instructed to check blood glucose 4x/day 05/30/15   Edwena Felty, MD  insulin aspart (NOVOLOG FLEXPEN) 100 UNIT/ML FlexPen Inject 25 Units into the skin 3 (three) times daily with meals. 07/10/15   Edwena Felty, MD  Insulin Glargine (LANTUS SOLOSTAR) 100 UNIT/ML Solostar Pen Inject 50 Units into the skin daily at 10 pm. Patient taking differently: Inject 45 Units into the skin daily at 10 pm.  07/10/15   Edwena Felty, MD  Insulin Pen Needle (EASY TOUCH PEN NEEDLES) 32G X 6 MM MISC Use with Flexpen to administer Insulin 08/04/14   Radhika P Phadke, MD  Lancets (FREESTYLE) lancets Use as instructed to check blood glucose 4x/day 05/30/15   Edwena Felty, MD  metFORMIN (GLUCOPHAGE) 1000 MG tablet Take 1 tablet (1,000 mg total) by mouth 2 (two) times daily with a meal. 04/05/15   Edwena Felty, MD  metoCLOPramide (REGLAN) 10 MG tablet Take 1 tablet (10 mg total) by mouth 3 (three) times daily as needed for nausea (headache / nausea). 12/22/14   Garlon Hatchet, PA-C  oxyCODONE-acetaminophen (PERCOCET/ROXICET) 5-325 MG tablet Take 1 tablet by mouth every 12 (twelve) hours as needed for severe pain. 08/03/15   Edwena Felty, MD  predniSONE (STERAPRED UNI-PAK 21 TAB) 10 MG (21) TBPK tablet Use as directed in a 6 day taper prednisone pack. Patient not taking: Reported on 08/09/2015 08/07/15   Edwena Felty, MD  prochlorperazine (COMPAZINE) 10 MG tablet Take 1 tablet (10 mg total) by mouth 2 (two) times daily as needed (Headache). Take with Benadryl 25 mg. 10/27/14   Arby Barrette, MD   BP 137/90 mmHg  Pulse 110  Temp(Src) 98.2 F (36.8 C) (Axillary)  Resp 16  Ht  (1.778 m)  Wt 233 lb (105.688 kg)  BMI 33.43 kg/m2  SpO2 97% Physical Exam  Constitutional: He is oriented to person, place, and time. He appears well-developed and  well-nourished. No distress.  HENT:  Head: Normocephalic and atraumatic.  Mouth/Throat: Oropharynx is clear and moist.  Eyes: Conjunctivae and EOM are normal. Pupils are equal, round, and reactive to light.  Neck: Normal range of motion. Neck supple.  Cardiovascular: Regular rhythm, normal heart sounds and intact distal pulses.  Tachycardia present.   Pulmonary/Chest: Effort normal and breath sounds normal. No respiratory distress.  Abdominal: Soft. Bowel sounds are normal.  Musculoskeletal: Normal range of motion. He exhibits no edema or tenderness.  Neurological: He is alert and oriented to person, place, and time. He has normal reflexes.  No sensory deficits. Strength 4/5 in upper and lower left extremities. Strength 5/5 on right side. No gait disturbance. Cranial nerves II-XII grossly intact. Mild facial droop on left accompanied by slurred speech.   Skin: Skin is warm and dry. He is not diaphoretic.  Nursing note and vitals reviewed.   ED Course  Procedures (including critical care time) Labs Review Labs Reviewed  CBC WITH DIFFERENTIAL/PLATELET - Abnormal; Notable for the following:    WBC 11.0 (*)    Hemoglobin 17.3 (*)    Neutro Abs 7.9 (*)    All other components within normal limits  COMPREHENSIVE METABOLIC PANEL - Abnormal; Notable for the following:    Sodium 124 (*)    Potassium 6.1 (*)    Chloride 83 (*)    CO2 16 (*)    Glucose, Bld 863 (*)    BUN 35 (*)    Creatinine, Ser 1.84 (*)    Calcium 11.6 (*)    Total Protein 9.1 (*)    AST 52 (*)    ALT 246 (*)    Total Bilirubin 1.7 (*)    GFR calc non Af Amer 38 (*)    GFR calc Af Amer 44 (*)    Anion gap 25 (*)    All other components within normal limits  URINALYSIS, ROUTINE W REFLEX MICROSCOPIC (NOT AT [facility]) - Abnormal; Notable for the following:    Glucose, UA >1000 (*)    Ketones, ur 40 (*)    All other components within normal limits  PROTIME-INR - Abnormal; Notable for the following:    Prothrombin Time  15.7 (*)    All other components within normal limits  URINE RAPID DRUG SCREEN, HOSP PERFORMED - Abnormal; Notable for the following:    Cocaine POSITIVE (*)    All other components within normal limits  CBG MONITORING, ED - Abnormal; Notable for the following:    Glucose-Capillary >600 (*)    All other components within normal limits  I-STAT CHEM 8, ED - Abnormal; Notable for the following:    Sodium 124 (*)    Potassium 6.0 (*)    Chloride 92 (*)    BUN 42 (*)    Creatinine, Ser 1.30 (*)    Glucose, Bld >700 (*)    Calcium, Ion 1.35 (*)    Hemoglobin 20.4 (*)    HCT 60.0 (*)    All other components within normal limits  CBG MONITORING, ED - Abnormal; Notable for the following:    Glucose-Capillary >600 (*)    All other components within normal limits  I-STAT CG4 LACTIC ACID, ED - Abnormal; Notable for the following:    Lactic Acid, Venous 5.26 (*)    All other components within normal limits  I-STAT ARTERIAL BLOOD GAS, ED - Abnormal; Notable for the following:    pH, Arterial 7.278 (*)    pO2, Arterial 78.0 (*)    Bicarbonate 16.6 (*)    Acid-base deficit 9.0 (*)    All other components within normal limits  CBG MONITORING, ED - Abnormal; Notable for the following:    Glucose-Capillary 573 (*)    All other components within normal limits  APTT  URINE MICROSCOPIC-ADD ON  ETHANOL  BASIC METABOLIC PANEL  MAGNESIUM  PHOSPHORUS  I-STAT TROPOININ, ED  I-STAT TROPOININ, ED  I-STAT VENOUS BLOOD GAS, ED  I-STAT CG4 LACTIC ACID, ED    Imaging Review Ct Head Wo Contrast  08/09/2015  CLINICAL DATA:  Confusion and headache; slurred speech for 1 day EXAM: CT HEAD WITHOUT CONTRAST TECHNIQUE: Contiguous axial images were obtained from the base of the skull through the vertex without intravenous contrast. COMPARISON:  November 30, 2014 FINDINGS: The ventricles are normal in size and configuration. There is no intracranial mass, hemorrhage, extra-axial fluid collection, or midline  shift. There is stable  patchy small vessel disease in the centra semiovale bilaterally. Elsewhere gray-white compartments appear normal. No acute infarct apparent. The bony calvarium appears intact. The mastoid air cells are clear. IMPRESSION: Patchy periventricular small vessel disease, essentially stable. No new gray-white compartment lesions. No acute infarct evident. No hemorrhage or mass effect. Electronically Signed   By: Bretta Bang III M.D.   On: 08/09/2015 12:26   Dg Chest Portable 1 View  08/09/2015  CLINICAL DATA:  Left-sided weakness.  Hypertension. EXAM: PORTABLE CHEST 1 VIEW COMPARISON:  None. FINDINGS: The lungs are clear. The heart size and pulmonary vascularity are normal. No adenopathy. There is degenerative change in the lower thoracic spine. IMPRESSION: No edema or consolidation. Electronically Signed   By: Bretta Bang III M.D.   On: 08/09/2015 10:40   I have personally reviewed and evaluated these images and lab results as part of my medical decision-making.   EKG Interpretation   Date/Time:  Thursday August 09 2015 09:14:13 EDT Ventricular Rate:  113 PR Interval:  153 QRS Duration: 103 QT Interval:  314 QTC Calculation: 430 R Axis:   -42 Text Interpretation:  Sinus tachycardia Inferior infarct, old Baseline  wander in lead(s) V2 Artifact Confirmed by Manus Gunning  MD, STEPHEN [phone]) on  08/09/2015 9:40:19 AM      MDM   Final diagnoses:  Chest pain  Weakness of left lower extremity  Confusion  Diabetic ketoacidosis without coma associated with type 2 diabetes mellitus (HCC)    William Side presents with new onset neurologic deficits over the past week.  Findings and plan of care discussed with Glynn Octave, MD.  Pt c-spine xray shows degenerative changes, no fractures. Head CT from Feb 2016 shows no acute changes. Ordered new labs, head CT, and put in neuro consult. CBG is >600. IV fluids, insulin drip, and ABG ordered. Need to r/o aortic  dissection. If creatinine is ok, will order CTA chest, abdomen, pelvis.  10:43 AM Pt reassessed. States he feel "ok," but denies chest pain, shortness of breath, or headache. Pt creatinine is 1.84 and therefore is too high for IV contrast. Will have to obtain an alternate study. Suspicion is not high enough for thoracic aortic dissection to warrant the stress on pt kidneys with decreased function. Pulses and BP equal on both sides. Pt previous weakness on my exam resolved by the time Dr. Manus Gunning did his assessment.  11:53 AM Pt now has some weakness in left hand and left foot again. Pt states he frequently has weakness in his left shoulder due to rotator cuff repair, but grip and lower extremity weakness is not normal for him. Pt will need to be admitted, most notably for his DKA.  Waiting on Head CT and ABG to determine what level of inpatient care will be needed.  ABG shows pH of 7.278 with Lactate of 5.26, but no obvious source of infection.  Head CT shows no hemorrhage or mass effect.  Dr. Manus Gunning spoke with hospitalist service, who agreed to admit pt. Reassessment reveals no changes since previous exam.  Anselm Pancoast, PA-C 08/09/15 1317  Glynn Octave, MD 08/09/15 [phone]

## 2015-08-10 ENCOUNTER — Telehealth: Payer: Self-pay | Admitting: Family Medicine

## 2015-08-10 ENCOUNTER — Ambulatory Visit: Payer: Medicaid Other | Admitting: Family Medicine

## 2015-08-10 DIAGNOSIS — E1310 Other specified diabetes mellitus with ketoacidosis without coma: Secondary | ICD-10-CM

## 2015-08-10 DIAGNOSIS — G8929 Other chronic pain: Secondary | ICD-10-CM

## 2015-08-10 DIAGNOSIS — F1410 Cocaine abuse, uncomplicated: Secondary | ICD-10-CM

## 2015-08-10 DIAGNOSIS — I10 Essential (primary) hypertension: Secondary | ICD-10-CM

## 2015-08-10 DIAGNOSIS — E876 Hypokalemia: Secondary | ICD-10-CM

## 2015-08-10 DIAGNOSIS — K219 Gastro-esophageal reflux disease without esophagitis: Secondary | ICD-10-CM

## 2015-08-10 LAB — BASIC METABOLIC PANEL
Anion gap: 9 (ref 5–15)
BUN: 19 mg/dL (ref 6–20)
CALCIUM: 9.2 mg/dL (ref 8.9–10.3)
CHLORIDE: 104 mmol/L (ref 101–111)
CO2: 23 mmol/L (ref 22–32)
CREATININE: 0.93 mg/dL (ref 0.61–1.24)
Glucose, Bld: 124 mg/dL — ABNORMAL HIGH (ref 65–99)
Potassium: 3.2 mmol/L — ABNORMAL LOW (ref 3.5–5.1)
SODIUM: 136 mmol/L (ref 135–145)

## 2015-08-10 LAB — GLUCOSE, CAPILLARY
GLUCOSE-CAPILLARY: 131 mg/dL — AB (ref 65–99)
GLUCOSE-CAPILLARY: 139 mg/dL — AB (ref 65–99)
GLUCOSE-CAPILLARY: 150 mg/dL — AB (ref 65–99)
GLUCOSE-CAPILLARY: 179 mg/dL — AB (ref 65–99)
GLUCOSE-CAPILLARY: 285 mg/dL — AB (ref 65–99)
GLUCOSE-CAPILLARY: 368 mg/dL — AB (ref 65–99)
Glucose-Capillary: 176 mg/dL — ABNORMAL HIGH (ref 65–99)
Glucose-Capillary: 265 mg/dL — ABNORMAL HIGH (ref 65–99)
Glucose-Capillary: 219 mg/dL — ABNORMAL HIGH (ref 65–99)

## 2015-08-10 LAB — CBC
HEMATOCRIT: 44.7 % (ref 39.0–52.0)
HEMOGLOBIN: 16.2 g/dL (ref 13.0–17.0)
MCH: 30.9 pg (ref 26.0–34.0)
MCHC: 36.2 g/dL — ABNORMAL HIGH (ref 30.0–36.0)
MCV: 85.3 fL (ref 78.0–100.0)
Platelets: 173 10*3/uL (ref 150–400)
RBC: 5.24 MIL/uL (ref 4.22–5.81)
RDW: 13.8 % (ref 11.5–15.5)
WBC: 9.6 10*3/uL (ref 4.0–10.5)

## 2015-08-10 LAB — TSH: TSH: 1.089 u[IU]/mL (ref 0.350–4.500)

## 2015-08-10 MED ORDER — PHENOL 1.4 % MT LIQD
1.0000 | OROMUCOSAL | Status: DC | PRN
  Administered 2015-08-10: 1 via OROMUCOSAL
  Filled 2015-08-10: qty 177

## 2015-08-10 MED ORDER — SODIUM CHLORIDE 0.9 % IV SOLN
INTRAVENOUS | Status: DC
  Administered 2015-08-10: 06:00:00 via INTRAVENOUS

## 2015-08-10 MED ORDER — SUCRALFATE 1 GM/10ML PO SUSP
1.0000 g | Freq: Three times a day (TID) | ORAL | Status: DC
  Administered 2015-08-10 – 2015-08-11 (×3): 1 g via ORAL
  Filled 2015-08-10 (×4): qty 10

## 2015-08-10 MED ORDER — PANTOPRAZOLE SODIUM 40 MG PO TBEC
40.0000 mg | DELAYED_RELEASE_TABLET | Freq: Two times a day (BID) | ORAL | Status: DC
  Administered 2015-08-10 – 2015-08-11 (×2): 40 mg via ORAL
  Filled 2015-08-10 (×3): qty 1

## 2015-08-10 MED ORDER — GLUCERNA SHAKE PO LIQD
237.0000 mL | Freq: Three times a day (TID) | ORAL | Status: DC
  Administered 2015-08-10 – 2015-08-11 (×3): 237 mL via ORAL

## 2015-08-10 MED ORDER — POLYETHYLENE GLYCOL 3350 17 G PO PACK
17.0000 g | PACK | Freq: Every day | ORAL | Status: DC | PRN
  Administered 2015-08-10: 17 g via ORAL
  Filled 2015-08-10: qty 1

## 2015-08-10 MED ORDER — HYDRALAZINE HCL 20 MG/ML IJ SOLN
10.0000 mg | Freq: Once | INTRAMUSCULAR | Status: AC
  Administered 2015-08-10: 10 mg via INTRAVENOUS
  Filled 2015-08-10: qty 1

## 2015-08-10 MED ORDER — INSULIN ASPART 100 UNIT/ML ~~LOC~~ SOLN
0.0000 [IU] | Freq: Three times a day (TID) | SUBCUTANEOUS | Status: DC
  Administered 2015-08-10: 8 [IU] via SUBCUTANEOUS
  Administered 2015-08-10: 15 [IU] via SUBCUTANEOUS
  Administered 2015-08-10: 8 [IU] via SUBCUTANEOUS
  Administered 2015-08-11: 2 [IU] via SUBCUTANEOUS

## 2015-08-10 MED ORDER — INSULIN ASPART 100 UNIT/ML ~~LOC~~ SOLN
10.0000 [IU] | Freq: Three times a day (TID) | SUBCUTANEOUS | Status: DC
  Administered 2015-08-10 – 2015-08-11 (×2): 10 [IU] via SUBCUTANEOUS

## 2015-08-10 MED ORDER — SENNOSIDES-DOCUSATE SODIUM 8.6-50 MG PO TABS: 2.0000 | ORAL_TABLET | Freq: Every evening | ORAL | Status: DC | PRN

## 2015-08-10 MED ORDER — INSULIN GLARGINE 100 UNIT/ML ~~LOC~~ SOLN
40.0000 [IU] | Freq: Every day | SUBCUTANEOUS | Status: DC
  Administered 2015-08-10: 40 [IU] via SUBCUTANEOUS
  Filled 2015-08-10 (×2): qty 0.4

## 2015-08-10 MED ORDER — INSULIN GLARGINE 100 UNIT/ML ~~LOC~~ SOLN
25.0000 [IU] | Freq: Once | SUBCUTANEOUS | Status: AC
Start: 2015-08-10 — End: 2015-08-10
  Administered 2015-08-10: 25 [IU] via SUBCUTANEOUS
  Filled 2015-08-10: qty 0.25

## 2015-08-10 MED ORDER — POTASSIUM CHLORIDE CRYS ER 20 MEQ PO TBCR
40.0000 meq | EXTENDED_RELEASE_TABLET | Freq: Two times a day (BID) | ORAL | Status: AC
  Administered 2015-08-10 (×2): 40 meq via ORAL
  Filled 2015-08-10 (×2): qty 2

## 2015-08-10 MED ORDER — PANTOPRAZOLE SODIUM 40 MG PO TBEC
40.0000 mg | DELAYED_RELEASE_TABLET | Freq: Every day | ORAL | Status: DC
  Administered 2015-08-10: 40 mg via ORAL
  Filled 2015-08-10: qty 1

## 2015-08-10 MED ORDER — DILTIAZEM HCL ER 60 MG PO CP12
60.0000 mg | ORAL_CAPSULE | Freq: Two times a day (BID) | ORAL | Status: DC
  Administered 2015-08-10 (×2): 60 mg via ORAL
  Filled 2015-08-10 (×4): qty 1

## 2015-08-10 NOTE — Telephone Encounter (Signed)
I have reviewed admission notes as well and appreciate Patricia's update.

## 2015-08-10 NOTE — Progress Notes (Signed)
Spoke briefly with patient regarding elevated blood sugars.  He states that blood sugars increased after starting steroids however he also admits that blood sugars are up and down at times.  [facility].  Recommended follow-up with PCP.  He state he does have insulin and meter at home for management of diabetes.  Discussed with RN.    Inpatient Diabetes Program Recommendations:    Please consider restarting Lantus 45 units q HS. Also consider restarting a portion of patient's home dose of meal time Novolog. Consider Novolog 12 units tid with meals  Thanks, Beryl MeagerJenny Corry Storie, RN, BC-ADM Inpatient Diabetes Coordinator Pager [phone] (8a-5p)

## 2015-08-10 NOTE — Telephone Encounter (Signed)
Elease Hashimotoatricia called wanting to give a update on pt. He is currently in ICU [place] [facility] room 12. He had a really bad night last night. He went in the ER yesterday due to his sugar being around a 1000, per the nurse that checked it. His heart rate is high and BP high. Patient is awake and responds but he is very weak, however he did eat this morning. Pt states she will call back if things change.

## 2015-08-10 NOTE — Progress Notes (Signed)
Initial Nutrition Assessment  DOCUMENTATION CODES:   Obesity unspecified  INTERVENTION:  Provide Glucerna Shake po TID, each supplement provides 220 kcal and 10 grams of protein.  Encourage adequate PO intake.   NUTRITION DIAGNOSIS:   Inadequate oral intake related to poor appetite as evidenced by meal completion < 25%.  GOAL:   Patient will meet greater than or equal to 90% of their needs  MONITOR:   PO intake, Supplement acceptance, Weight trends, Labs, I & O's  REASON FOR ASSESSMENT:   Malnutrition Screening Tool    ASSESSMENT:   59 y.o. male, pt with history of TIA, HTN, and DM, presenting to ED with confusion, unilateral weakness, headache, and difficulty breathing for the past week.  Pt reports current appetite is "so so". No meal completion recorded, however pt reports po intake of only 25% this morning. Pt states PTA he has been experiencing a decreased appetite since Monday however has been continuing to consume at least 3 meals daily. Usual body weight reported to be ~230 lbs. Question weight accuracy in records. Pt is agreeable to nutritional supplements to aid in caloric and protein needs. RD to order. Pt was encouraged to eat his food at meals.   Pt with no observed significant fat or muscle mass loss.   Labs and medications reviewed.   Diet Order:  Diet Carb Modified Fluid consistency:: Thin; Room service appropriate?: Yes  Skin:  Reviewed, no issues  Last BM:  10/31  Height:   Ht Readings from Last 1 Encounters:  08/09/15 5\' 10"  (1.778 m)    Weight:   Wt Readings from Last 1 Encounters:  08/09/15 214 lb 15.2 oz (97.5 kg)    Ideal Body Weight:  75.45 kg  BMI:  Body mass index is 30.84 kg/(m^2).  Estimated Nutritional Needs:   Kcal:  2100-2300  Protein:  100-115 grams  Fluid:  2.1- 2.3 L/day  EDUCATION NEEDS:   No education needs identified at this time  Roslyn SmilingStephanie Leianne Callins, MS, RD, LDN Pager # [phone] After hours/ weekend pager #  [phone]

## 2015-08-10 NOTE — Progress Notes (Signed)
[facility] PROGRESS NOTE  Jill SideWarren Strength UJW:[id number] DOB: 09-07-1956 DOA: 08/09/2015 PCP: Edwena FeltyAshany Sundaram, MD  Assessment/Plan: 1-DKA: triggered by uncontrolled diabetes and use of steroids -prednisone now discontinue -DKA resolved with IV insulin therapy -will now resume lantus, novolog meal coverage and continue SSI -patient on modified carb diet -will follow A1C  2-chest/epigastric pain: due to gastritis (worsen by use of steroids); patient with hiatal hernia -will use PPI BID and start tx with carafate -will follow response  -negative troponin  3-hypokalemia: will replete and follow BMET in am  4-HTN: will use cardizem for control  5-tachycardia: sinus in nature -will check TSH -will continue IVF's -will use cardizem for better control  6-cocaine abuse: cessation counseling provided -patient blame use on AMS from steroids and been out of his mind during that state -will avoid B-blockers  7-MSK/neuropatic pain: will continue home flexeril, percocet and nenurontin   Code Status: Full Family Communication: no family at bedside Disposition Plan: will transfer out of stepdown, continue adjustment of hypoglycemic regimen; replete electrolytes and follow BMET in am   Consultants:  None   Procedures:  See below for x-ray reports   Antibiotics:  None   HPI/Subjective: Feeling somewhat better; DKA resolved now. No fever, complaining of epigastric pain and burning sensation.  Objective: Filed Vitals:   08/10/15 1100  BP:   Pulse:   Temp: 98.2 F (36.8 C)  Resp:     Intake/Output Summary (Last 24 hours) at 08/10/15 1405 Last data filed at 08/10/15 1200  Gross per 24 hour  Intake 1627.5 ml  Output   1825 ml  Net -197.5 ml   Filed Weights   08/09/15 0913 08/09/15 1435  Weight: 105.688 kg (233 lb) 97.5 kg (214 lb 15.2 oz)    Exam:   General:  Afebrile, no SOB. Complaining of epigastric discomfort and pain on his shoulders.   Cardiovascular:  Tachycardic, no rubs, no gallops, no murmurs   Respiratory: CTA, no wheezing, no rales  Abdomen: epigastric discomfort, no guarding, soft, positive BS  Musculoskeletal: no edema, no cyanosis   Data Reviewed: Basic Metabolic Panel:  Recent Labs Lab 08/09/15 1510 08/09/15 1722 08/09/15 1824 08/09/15 2120 08/10/15 0127  NA 132* 137 137 135 136  K 4.0 4.0 3.7 4.0 3.2*  CL 100* 103 102 106 104  CO2 18* 21* 22 21* 23  GLUCOSE 368* 212* 144* 174* 124*  BUN 28* 23* 23* 22* 19  CREATININE 1.41* 1.08 1.07 1.02 0.93  CALCIUM 9.7 10.1 10.0 9.1 9.2  MG  --  2.3  --   --   --   PHOS  --  1.8*  --   --   --    Liver Function Tests:  Recent Labs Lab 08/09/15 0930  AST 52*  ALT 246*  ALKPHOS 104  BILITOT 1.7*  PROT 9.1*  ALBUMIN 4.6   CBC:  Recent Labs Lab 08/09/15 0930 08/09/15 1016 08/10/15 0127  WBC 11.0*  --  9.6  NEUTROABS 7.9*  --   --   HGB 17.3* 20.4* 16.2  HCT 50.7 60.0* 44.7  MCV 89.9  --  85.3  PLT 195  --  173   Cardiac Enzymes:  Recent Labs Lab 08/09/15 1722 08/09/15 2120  TROPONINI <0.03 0.03   CBG:  Recent Labs Lab 08/10/15 0231 08/10/15 0358 08/10/15 0504 08/10/15 0751 08/10/15 1229  GLUCAP 150* 176* 179* 285* 265*    Recent Results (from the past 240 hour(s))  MRSA PCR Screening  Status: None   Collection Time: 08/09/15  3:07 PM  Result Value Ref Range Status   MRSA by PCR NEGATIVE NEGATIVE Final    Comment:        The GeneXpert MRSA Assay (FDA approved for NASAL specimens only), is one component of a comprehensive MRSA colonization surveillance program. It is not intended to diagnose MRSA infection nor to guide or monitor treatment for MRSA infections.      Studies: Ct Head Wo Contrast  08/09/2015  CLINICAL DATA:  Confusion and headache; slurred speech for 1 day EXAM: CT HEAD WITHOUT CONTRAST TECHNIQUE: Contiguous axial images were obtained from the base of the skull through the vertex without intravenous contrast.  COMPARISON:  November 30, 2014 FINDINGS: The ventricles are normal in size and configuration. There is no intracranial mass, hemorrhage, extra-axial fluid collection, or midline shift. There is stable patchy small vessel disease in the centra semiovale bilaterally. Elsewhere gray-white compartments appear normal. No acute infarct apparent. The bony calvarium appears intact. The mastoid air cells are clear. IMPRESSION: Patchy periventricular small vessel disease, essentially stable. No new gray-white compartment lesions. No acute infarct evident. No hemorrhage or mass effect. Electronically Signed   By: Bretta Bang III M.D.   On: 08/09/2015 12:26   Dg Chest Portable 1 View  08/09/2015  CLINICAL DATA:  Left-sided weakness.  Hypertension. EXAM: PORTABLE CHEST 1 VIEW COMPARISON:  None. FINDINGS: The lungs are clear. The heart size and pulmonary vascularity are normal. No adenopathy. There is degenerative change in the lower thoracic spine. IMPRESSION: No edema or consolidation. Electronically Signed   By: Bretta Bang III M.D.   On: 08/09/2015 10:40    Scheduled Meds: . amLODipine  10 mg Oral Daily  . enoxaparin (LOVENOX) injection  40 mg Subcutaneous Q24H  . gabapentin  100 mg Oral TID  . insulin aspart  0-15 Units Subcutaneous TID WC  . insulin aspart  10 Units Subcutaneous TID WC  . insulin glargine  40 Units Subcutaneous QHS  . ondansetron (ZOFRAN) IV  8 mg Intravenous Once  . pantoprazole  40 mg Oral Q1200  . potassium chloride  40 mEq Oral BID  . sodium chloride  3 mL Intravenous Q12H   Continuous Infusions: . sodium chloride 75 mL/hr at 08/10/15 1200    Principal Problem:   DKA (diabetic ketoacidoses) (HCC) Active Problems:   Cocaine abuse   Chest pain   Hyponatremia   Essential hypertension   Migraine with aura   Insomnia   Chronic pain    Time spent: 40 minutes   Vassie Loll  [facility] Pager [phone]. If 7PM-7AM, please contact night-coverage at  [email or web address], password [email or web address] 08/10/2015, 2:05 PM  LOS: 1 day

## 2015-08-10 NOTE — Progress Notes (Signed)
Inpatient Diabetes Program Recommendations  AACE/ADA: New Consensus Statement on Inpatient Glycemic Control (2015)  Target Ranges:  Prepandial:   less than 140 mg/dL      Peak postprandial:   less than 180 mg/dL (1-2 hours)      Critically ill patients:  140 - 180 mg/dL  Results for William [name], Presley (MRN [id number]) as of 08/10/2015 11:59  Ref. Range 06/07/2015 10:41  Hemoglobin A1C Latest Ref Range: 4.8-5.6 % 10.1 (H)   Review of Glycemic Control:  Note patient admitted with blood sugars greater than 600 mg/dL.  History states he was started on dose pack for shoulder pain 3 days ago.  He was on IV insulin initially and now transitioned to SQ insulin.  He received Lantus 25 units at 0124 am.   Diabetes history: Uncontrolled Type 2 diabetes Outpatient Diabetes medications: Lantus 45 units daily, Novolog 25 units tid with meals, Metformin 1000 mg bid Current orders for Inpatient glycemic control:  Novolog moderate tid with meals  Inpatient Diabetes Program Recommendations:    Please consider restarting Lantus 45 units q HS.  Also consider restarting a portion of patient's home dose of meal time Novolog.  Consider Novolog 12 units tid with meals.   Thanks, Beryl MeagerJenny Neisha Hinger, RN, BC-ADM Inpatient Diabetes Coordinator Pager [phone] (8a-5p)

## 2015-08-10 NOTE — Progress Notes (Signed)
UR COMPLETED  

## 2015-08-11 DIAGNOSIS — E871 Hypo-osmolality and hyponatremia: Secondary | ICD-10-CM

## 2015-08-11 DIAGNOSIS — F1010 Alcohol abuse, uncomplicated: Secondary | ICD-10-CM | POA: Insufficient documentation

## 2015-08-11 DIAGNOSIS — K219 Gastro-esophageal reflux disease without esophagitis: Secondary | ICD-10-CM | POA: Insufficient documentation

## 2015-08-11 LAB — BASIC METABOLIC PANEL
Anion gap: 11 (ref 5–15)
BUN: 10 mg/dL (ref 6–20)
CALCIUM: 8.8 mg/dL — AB (ref 8.9–10.3)
CO2: 24 mmol/L (ref 22–32)
CREATININE: 0.84 mg/dL (ref 0.61–1.24)
Chloride: 99 mmol/L — ABNORMAL LOW (ref 101–111)
GFR calc Af Amer: >60 mL/min (ref >60)
GLUCOSE: 277 mg/dL — AB (ref 65–99)
POTASSIUM: 3.9 mmol/L (ref 3.5–5.1)
Sodium: 134 mmol/L — ABNORMAL LOW (ref 135–145)

## 2015-08-11 LAB — GLUCOSE, CAPILLARY
Glucose-Capillary: 240 mg/dL — ABNORMAL HIGH (ref 65–99)
Glucose-Capillary: 334 mg/dL — ABNORMAL HIGH (ref 65–99)

## 2015-08-11 MED ORDER — SUCRALFATE 1 GM/10ML PO SUSP: 1.0000 g | Freq: Three times a day (TID) | ORAL | Status: DC

## 2015-08-11 MED ORDER — DILTIAZEM HCL ER 90 MG PO CP12
90.0000 mg | ORAL_CAPSULE | Freq: Two times a day (BID) | ORAL | Status: DC
  Administered 2015-08-11: 90 mg via ORAL
  Filled 2015-08-11 (×2): qty 1

## 2015-08-11 MED ORDER — PANTOPRAZOLE SODIUM 40 MG PO TBEC: 40.0000 mg | DELAYED_RELEASE_TABLET | Freq: Two times a day (BID) | ORAL | Status: DC

## 2015-08-11 MED ORDER — INSULIN ASPART 100 UNIT/ML ~~LOC~~ SOLN: 15.0000 [IU] | Freq: Three times a day (TID) | SUBCUTANEOUS | Status: DC

## 2015-08-11 MED ORDER — INSULIN GLARGINE 100 UNIT/ML ~~LOC~~ SOLN
50.0000 [IU] | Freq: Every day | SUBCUTANEOUS | Status: DC
  Filled 2015-08-11: qty 0.5

## 2015-08-11 MED ORDER — INSULIN GLARGINE 100 UNIT/ML SOLOSTAR PEN
30.0000 [IU] | PEN_INJECTOR | Freq: Two times a day (BID) | SUBCUTANEOUS | Status: DC
Start: 2015-08-11 — End: 2016-01-04

## 2015-08-11 MED ORDER — DILTIAZEM HCL ER 90 MG PO CP12: 90.0000 mg | ORAL_CAPSULE | Freq: Two times a day (BID) | ORAL | Status: DC

## 2015-08-11 MED ORDER — INSULIN ASPART 100 UNIT/ML FLEXPEN: 15.0000 [IU] | PEN_INJECTOR | Freq: Three times a day (TID) | SUBCUTANEOUS | Status: AC

## 2015-08-11 NOTE — Progress Notes (Signed)
Patient states that the roof of his mouth is hurting and feels swollen. MD paged

## 2015-08-11 NOTE — Discharge Summary (Signed)
Physician Discharge Summary  William [name] ZOX:[id number] DOB: 02-22-1956 DOA: 08/09/2015  PCP: Edwena Felty, MD  Admit date: 08/09/2015 Discharge date: 08/11/2015  Time spent: 40 minutes  Recommendations for Outpatient Follow-up:  1. Reassess BP and adjust antihypertensive regimen as needed (will benefit of ACE or ARB) 2. Close follow up to CBG's for further adjustment on his hypoglycemic regimen 3. Please follow resolution/control of EGD; if still present will benefit of GI referral for EGD 4. Check BMET to follow electrolytes and renal function  Discharge Diagnoses:  Principal Problem:   DKA (diabetic ketoacidoses) (HCC) Active Problems:   Cocaine abuse   Chest pain   Hyponatremia   Essential hypertension   Migraine with aura   Insomnia   Chronic pain   Discharge Condition: stable and improved. DKA resolved and insulin therapy adjusted. Patient will follow up with PCP in 10 days.  Diet recommendation: modified carbohydrates and heart healthy  Filed Weights   08/09/15 0913 08/09/15 1435 08/10/15 2154  Weight: 105.688 kg (233 lb) 97.5 kg (214 lb 15.2 oz) 99 kg (218 lb 4.1 oz)    History of present illness:  59 y.o. male 7-10 days of actinic and left shoulder pain. Fairly constant. Patient follow-up with his primary care physician 3 days ago who prescribed him a steroid Dosepak to treat his muscle skeletal type pain. After starting the steroids patient states that he began to stumble as he walked, become more forgetful, drop things out of his hand and developed headache. States that he had a similar reaction to steroids one year ago and forgot about this prior to starting his current course of steroids. Patient with chronic migraines and states that this headache is similar to those which he also developed aura. Aura typically manifests with unilateral upper and lower extremity weakness. Reglan at home without relief of headache. Headache is constant with waxing and waning nature.  Patient also with chest pain 3 days which comes and goes and is nonexertional. Does not radiate. Slowly resolving.  Hospital Course:  1-DKA: triggered by uncontrolled diabetes and use of steroids -prednisone has been discontinue -DKA resolved with IV insulin therapy; at discharge normal anion GAP and Bicarb -will discharge on adjusted dose of lantus (now 30 units BID) and also the use of novolog TID for meal coverage (15 units TID) -patient instructed to follow modified carb diet -last A1C 10.1  2-chest/epigastric pain: due to gastritis (worsen by use of steroids); patient with hiatal hernia as well -will use PPI BID and discharge on carafate -follow symptoms and if failed to improve will need EGD evaluation  3-Hypokalemia: repleted and WNL at discharge  4-HTN: will use cardizem for better control -patient advise to follow heart healthy diet  5-tachycardia: sinus in nature -TSH WNL -will discharge on cardizem for better control of his BP and also HR -advise to keep himself well hydrated  6-cocaine abuse:  -cessation counseling provided -patient blame use on AMS from steroids and been out of his mind during that state -will avoid B-blockers for now given cocaine use  7-MSK/neuropatic pain: will continue home flexeril, percocet and nenurontin  8-hyponatremia: associated to hyperglycemia -resolved with IVF's and treatment of DKA  Procedures:  See below for x-ray reports   Consultations:  None   Discharge Exam: Filed Vitals:   08/11/15 0951  BP: 136/95  Pulse: 110  Temp: 98.7 F (37.1 C)  Resp: 20    General: Afebrile, no SOB. Feeling much better and wanting to go home.  Cardiovascular: sinus rhythm, no rubs, gallops or murmurs   Respiratory: CTA, no wheezing, no rales  Abdomen: epigastric discomfort significantly improved, no guarding, no distension, positive BS  Musculoskeletal: no edema, no cyanosis  Discharge Instructions   Discharge Instructions     Diet - low sodium heart healthy    Complete by:  As directed      Discharge instructions    Complete by:  As directed   Follow low carbohydrates diet Take medications as prescribed Stop use of recreational substances Please keep yourself well hydrated  Arrange follow up with PCP in 10 days Minimize/stop alcohol intake          Current Discharge Medication List    START taking these medications   Details  diltiazem (CARDIZEM SR) 90 MG 12 hr capsule Take 1 capsule (90 mg total) by mouth every 12 (twelve) hours. Qty: 60 capsule, Refills: 1    pantoprazole (PROTONIX) 40 MG tablet Take 1 tablet (40 mg total) by mouth 2 (two) times daily. Twice a day for 10 days; then start using it once a day Qty: 60 tablet, Refills: 1    sucralfate (CARAFATE) 1 GM/10ML suspension Take 10 mLs (1 g total) by mouth 4 (four) times daily -  with meals and at bedtime. Qty: 420 mL, Refills: 0      CONTINUE these medications which have CHANGED   Details  insulin aspart (NOVOLOG FLEXPEN) 100 UNIT/ML FlexPen Inject 15 Units into the skin 3 (three) times daily with meals. Qty: 8 pen, Refills: 5   Associated Diagnoses: Uncontrolled type 2 diabetes mellitus with peripheral circulatory disorder (HCC)    Insulin Glargine (LANTUS SOLOSTAR) 100 UNIT/ML Solostar Pen Inject 30 Units into the skin 2 (two) times daily. Qty: 6 pen, Refills: 2   Associated Diagnoses: Uncontrolled type 2 diabetes mellitus with peripheral circulatory disorder (HCC)      CONTINUE these medications which have NOT CHANGED   Details  diphenhydrAMINE (BENADRYL) 25 MG tablet Take 1 tablet (25 mg total) by mouth every 6 (six) hours. As needed with Compazine for headache. Qty: 20 tablet, Refills: 0    traZODone (DESYREL) 100 MG tablet Take 100 mg by mouth at bedtime as needed for sleep.     cyclobenzaprine (FLEXERIL) 10 MG tablet Take 1 tablet (10 mg total) by mouth 3 (three) times daily as needed for muscle spasms. Qty: 50 tablet,  Refills: 2   Associated Diagnoses: Chronic radicular lumbar pain    Elastic Bandages & Supports (THUMB BRACE) MISC 1 Device by Does not apply route daily as needed. Qty: 1 each, Refills: 0   Associated Diagnoses: Degenerative arthritis of thumb, right    gabapentin (NEURONTIN) 100 MG capsule Take 1 capsule (100 mg total) by mouth 3 (three) times daily. Qty: 90 capsule, Refills: 3   Associated Diagnoses: Uncontrolled type 2 diabetes mellitus with peripheral circulatory disorder (HCC); Chronic pain of multiple joints    glucose blood (ACCU-CHEK AVIVA) test strip Use as instructed to check blood glucose 4x/day Qty: 200 each, Refills: 12   Associated Diagnoses: Uncontrolled type 2 diabetes mellitus with peripheral circulatory disorder (HCC)    Insulin Pen Needle (EASY TOUCH PEN NEEDLES) 32G X 6 MM MISC Use with Flexpen to administer Insulin Qty: 100 each, Refills: 11    Lancets (FREESTYLE) lancets Use as instructed to check blood glucose 4x/day Qty: 200 each, Refills: 12   Associated Diagnoses: Uncontrolled type 2 diabetes mellitus with peripheral circulatory disorder (HCC)    metFORMIN (GLUCOPHAGE) 1000  MG tablet Take 1 tablet (1,000 mg total) by mouth 2 (two) times daily with a meal. Qty: 180 tablet, Refills: 1   Associated Diagnoses: Uncontrolled type 2 diabetes mellitus with peripheral neuropathy (HCC)    metoCLOPramide (REGLAN) 10 MG tablet Take 1 tablet (10 mg total) by mouth 3 (three) times daily as needed for nausea (headache / nausea). Qty: 20 tablet, Refills: 0    oxyCODONE-acetaminophen (PERCOCET/ROXICET) 5-325 MG tablet Take 1 tablet by mouth every 12 (twelve) hours as needed for severe pain. Qty: 60 tablet, Refills: 0   Associated Diagnoses: Chronic pain of multiple joints    prochlorperazine (COMPAZINE) 10 MG tablet Take 1 tablet (10 mg total) by mouth 2 (two) times daily as needed (Headache). Take with Benadryl 25 mg. Qty: 15 tablet, Refills: 0      STOP taking these  medications     amLODipine (NORVASC) 10 MG tablet        Allergies  Allergen Reactions  . Other Other (See Comments)    Reports all narcotics make him sick.  . Prednisone     Disoriented    Follow-up Information    Follow up with Edwena FeltyAshany Sundaram, MD. Schedule an appointment as soon as possible for a visit in 10 days.   Specialty:  Family Medicine   Contact information:   [address] [place] [address] [phone]       The results of significant diagnostics from this hospitalization (including imaging, microbiology, ancillary and laboratory) are listed below for reference.    Significant Diagnostic Studies: Dg Cervical Spine Complete  08/03/2015  CLINICAL DATA:  Neck pain, right arm pain and tingling EXAM: CERVICAL SPINE - COMPLETE 4+ VIEW COMPARISON:  None. FINDINGS: Five views of cervical spine submitted. Degenerative changes are noted C1-C2 articulation. No acute fracture or subluxation. There is disc space flattening with anterior spurring at C4-C5, C5-C6, C6-C7 level. Mild disc space flattening with anterior spurring at C7-T1 level. No prevertebral soft tissue swelling. Cervical airway is patent. Multilevel narrowing of neural foramina bilaterally. IMPRESSION: No acute fracture or subluxation. Degenerative changes as described above. Electronically Signed   By: Natasha MeadLiviu  Pop M.D.   On: 08/03/2015 16:01   Ct Head Wo Contrast  08/09/2015  CLINICAL DATA:  Confusion and headache; slurred speech for 1 day EXAM: CT HEAD WITHOUT CONTRAST TECHNIQUE: Contiguous axial images were obtained from the base of the skull through the vertex without intravenous contrast. COMPARISON:  November 30, 2014 FINDINGS: The ventricles are normal in size and configuration. There is no intracranial mass, hemorrhage, extra-axial fluid collection, or midline shift. There is stable patchy small vessel disease in the centra semiovale bilaterally. Elsewhere gray-white compartments appear normal.  No acute infarct apparent. The bony calvarium appears intact. The mastoid air cells are clear. IMPRESSION: Patchy periventricular small vessel disease, essentially stable. No new gray-white compartment lesions. No acute infarct evident. No hemorrhage or mass effect. Electronically Signed   By: Bretta BangWilliam  Woodruff III M.D.   On: 08/09/2015 12:26   [place] Abdomen Complete  07/25/2015  CLINICAL DATA:  Hepatitis C. EXAM: ULTRASOUND ABDOMEN COMPLETE COMPARISON:  None. FINDINGS: Gallbladder: No gallstones or wall thickening visualized. Prominent gallbladder folds noted. No sonographic Murphy sign noted. Common bile duct: Diameter: 3.3 mm Liver: Echogenic liver consistent fatty infiltration and/or hepatocellular disease. No focal significant hepatic abnormality . 1.5 cm simple cyst left hepatic lobe. IVC: No abnormality visualized. Pancreas: Visualized portion unremarkable. Spleen: Size and appearance within normal limits. Right Kidney: Length: 11.1 cm.  Echogenicity within normal limits. No significant mass or hydronephrosis visualized. 2.7 cm cyst right kidney . Left Kidney: Length: 12.5 cm. Echogenicity within normal limits. No mass or hydronephrosis visualized. Abdominal aorta: No aneurysm visualized. Other findings: None. IMPRESSION: Echogenic liver consistent fatty infiltration and/or hepatocellular disease. No focal solid hepatic abnormality identified. Electronically Signed   By: Maisie Fus  Register   On: 07/25/2015 10:10   Dg Chest Portable 1 View  08/09/2015  CLINICAL DATA:  Left-sided weakness.  Hypertension. EXAM: PORTABLE CHEST 1 VIEW COMPARISON:  None. FINDINGS: The lungs are clear. The heart size and pulmonary vascularity are normal. No adenopathy. There is degenerative change in the lower thoracic spine. IMPRESSION: No edema or consolidation. Electronically Signed   By: Bretta Bang III M.D.   On: 08/09/2015 10:40    Microbiology: Recent Results (from the past 240 hour(s))  MRSA PCR Screening      Status: None   Collection Time: 08/09/15  3:07 PM  Result Value Ref Range Status   MRSA by PCR NEGATIVE NEGATIVE Final    Comment:        The GeneXpert MRSA Assay (FDA approved for NASAL specimens only), is one component of a comprehensive MRSA colonization surveillance program. It is not intended to diagnose MRSA infection nor to guide or monitor treatment for MRSA infections.      Labs: Basic Metabolic Panel:  Recent Labs Lab 08/09/15 1722 08/09/15 1824 08/09/15 2120 08/10/15 0127 08/11/15 0529  NA 137 137 135 136 134*  K 4.0 3.7 4.0 3.2* 3.9  CL 103 102 106 104 99*  CO2 21* 22 21* 23 24  GLUCOSE 212* 144* 174* 124* 277*  BUN 23* 23* 22* 19 10  CREATININE 1.08 1.07 1.02 0.93 0.84  CALCIUM 10.1 10.0 9.1 9.2 8.8*  MG 2.3  --   --   --   --   PHOS 1.8*  --   --   --   --    Liver Function Tests:  Recent Labs Lab 08/09/15 0930  AST 52*  ALT 246*  ALKPHOS 104  BILITOT 1.7*  PROT 9.1*  ALBUMIN 4.6   CBC:  Recent Labs Lab 08/09/15 0930 08/09/15 1016 08/10/15 0127  WBC 11.0*  --  9.6  NEUTROABS 7.9*  --   --   HGB 17.3* 20.4* 16.2  HCT 50.7 60.0* 44.7  MCV 89.9  --  85.3  PLT 195  --  173   Cardiac Enzymes:  Recent Labs Lab 08/09/15 1722 08/09/15 2120  TROPONINI <0.03 0.03   CBG:  Recent Labs Lab 08/10/15 0751 08/10/15 1229 08/10/15 1612 08/10/15 2206 08/11/15 0732  GLUCAP 285* 265* 368* 219* 240*   Signed:  Vassie Loll  [facility] 08/11/2015, 10:22 AM

## 2015-08-11 NOTE — Progress Notes (Signed)
Pt discharged with wife. Pt. Is alert and oriented. Pt is hemodynamically stable. AVS reviewed with pt. Capable of re verbalizing medication regimen. Discharge plan appropriate and in place.

## 2015-08-11 NOTE — Progress Notes (Signed)
Patient's cbg was >300, he has discharge orders and states that he will medicate himself when he gets home. RN told patient that he is welcomed to eat and received insulin but patient states that he will do it.

## 2015-08-12 ENCOUNTER — Other Ambulatory Visit: Payer: Self-pay | Admitting: Family Medicine

## 2015-08-16 ENCOUNTER — Telehealth: Payer: Self-pay | Admitting: Family Medicine

## 2015-08-16 NOTE — Telephone Encounter (Signed)
Called and lvm on 08/16/15 @ 9:26am for patient to schedule a hospital follow-up appointment with Dr. Sherley BoundsSundaram.

## 2015-08-16 NOTE — Telephone Encounter (Signed)
Let William [name] and his wife William [name] know that I have been following his recently hospitalization very closely including all tests done and I am aware that his urine toxicology screen has COCAINE POSITIVE results and he has had a history of cocaine use in the past which I was aware of. This is very concerning. I know they have a lot going on but I will no longer be able to prescribe percocet or other controled substances for him as this violated our pain contract agreement.  We will discuss this further at his next visit.  PLEASE MAKE SURE HIS NEXT VISIT IS SCHEDULED FOR 30 MINS AS IT WILL BE HOSPITAL FOLLOW UP AND WE HAVE A LOT TO DISCUSS.

## 2015-09-03 ENCOUNTER — Ambulatory Visit: Admission: RE | Admit: 2015-09-03 | Payer: Medicaid Other | Source: Ambulatory Visit | Admitting: Gastroenterology

## 2015-09-03 ENCOUNTER — Encounter: Admission: RE | Payer: Self-pay | Source: Ambulatory Visit

## 2015-09-03 SURGERY — COLONOSCOPY
Anesthesia: General

## 2015-09-05 ENCOUNTER — Encounter (HOSPITAL_COMMUNITY): Payer: Self-pay | Admitting: Neurology

## 2015-09-05 ENCOUNTER — Emergency Department (HOSPITAL_COMMUNITY)
Admission: EM | Admit: 2015-09-05 | Discharge: 2015-09-05 | Disposition: A | Discharge status: Home / Self Care | Payer: Medicaid Other | Attending: Emergency Medicine | Admitting: Emergency Medicine

## 2015-09-05 DIAGNOSIS — G43909 Migraine, unspecified, not intractable, without status migrainosus: Secondary | ICD-10-CM | POA: Insufficient documentation

## 2015-09-05 DIAGNOSIS — M545 Low back pain, unspecified: Secondary | ICD-10-CM

## 2015-09-05 DIAGNOSIS — E669 Obesity, unspecified: Secondary | ICD-10-CM | POA: Insufficient documentation

## 2015-09-05 DIAGNOSIS — E119 Type 2 diabetes mellitus without complications: Secondary | ICD-10-CM | POA: Insufficient documentation

## 2015-09-05 DIAGNOSIS — G8929 Other chronic pain: Secondary | ICD-10-CM | POA: Diagnosis not present

## 2015-09-05 DIAGNOSIS — I10 Essential (primary) hypertension: Secondary | ICD-10-CM | POA: Diagnosis not present

## 2015-09-05 DIAGNOSIS — F329 Major depressive disorder, single episode, unspecified: Secondary | ICD-10-CM | POA: Diagnosis not present

## 2015-09-05 DIAGNOSIS — Z8673 Personal history of transient ischemic attack (TIA), and cerebral infarction without residual deficits: Secondary | ICD-10-CM | POA: Insufficient documentation

## 2015-09-05 DIAGNOSIS — Z79899 Other long term (current) drug therapy: Secondary | ICD-10-CM | POA: Insufficient documentation

## 2015-09-05 DIAGNOSIS — M62830 Muscle spasm of back: Secondary | ICD-10-CM | POA: Diagnosis not present

## 2015-09-05 DIAGNOSIS — Z794 Long term (current) use of insulin: Secondary | ICD-10-CM | POA: Diagnosis not present

## 2015-09-05 DIAGNOSIS — F17210 Nicotine dependence, cigarettes, uncomplicated: Secondary | ICD-10-CM | POA: Diagnosis not present

## 2015-09-05 DIAGNOSIS — Z8619 Personal history of other infectious and parasitic diseases: Secondary | ICD-10-CM | POA: Insufficient documentation

## 2015-09-05 DIAGNOSIS — M1990 Unspecified osteoarthritis, unspecified site: Secondary | ICD-10-CM | POA: Diagnosis not present

## 2015-09-05 MED ORDER — NAPROXEN 375 MG PO TABS
375.0000 mg | ORAL_TABLET | Freq: Two times a day (BID) | ORAL | Status: DC
Start: 2015-09-05 — End: 2015-12-05

## 2015-09-05 NOTE — ED Notes (Signed)
Pt reports lower back pain 3 days ago when he woke up. Denies any injury. Pt can ambulate but is painful. Denies any incontinence. Has been taking ibuprofen, percocet for pain with no relief.

## 2015-09-05 NOTE — Discharge Instructions (Signed)
SEEK IMMEDIATE MEDICAL ATTENTION IF: New numbness, tingling, weakness, or problem with the use of your arms or legs.  Severe back pain not relieved with medications.  Change in bowel or bladder control.  Increasing pain in any areas of the body (such as chest or abdominal pain).  Shortness of breath, dizziness or fainting.  Nausea (feeling sick to your stomach), vomiting, fever, or sweats.  ASK YOUR PHARMACIST ABOUT OVER THE COUNTER LIDOCAINE PATCHES Back Pain, Adult Back pain is very common in adults.The cause of back pain is rarely dangerous and the pain often gets better over time.The cause of your back pain may not be known. Some common causes of back pain include:  Strain of the muscles or ligaments supporting the spine.  Wear and tear (degeneration) of the spinal disks.  Arthritis.  Direct injury to the back. For many people, back pain may return. Since back pain is rarely dangerous, most people can learn to manage this condition on their own. HOME CARE INSTRUCTIONS Watch your back pain for any changes. The following actions may help to lessen any discomfort you are feeling:  Remain active. It is stressful on your back to sit or stand in one place for long periods of time. Do not sit, drive, or stand in one place for more than 30 minutes at a time. Take short walks on even surfaces as soon as you are able.Try to increase the length of time you walk each day.  Exercise regularly as directed by your health care provider. Exercise helps your back heal faster. It also helps avoid future injury by keeping your muscles strong and flexible.  Do not stay in bed.Resting more than 1-2 days can delay your recovery.  Pay attention to your body when you bend and lift. The most comfortable positions are those that put less stress on your recovering back. Always use proper lifting techniques, including:  Bending your knees.  Keeping the load close to your body.  Avoiding  twisting.  Find a comfortable position to sleep. Use a firm mattress and lie on your side with your knees slightly bent. If you lie on your back, put a pillow under your knees.  Avoid feeling anxious or stressed.Stress increases muscle tension and can worsen back pain.It is important to recognize when you are anxious or stressed and learn ways to manage it, such as with exercise.  Take medicines only as directed by your health care provider. Over-the-counter medicines to reduce pain and inflammation are often the most helpful.Your health care provider may prescribe muscle relaxant drugs.These medicines help dull your pain so you can more quickly return to your normal activities and healthy exercise.  Apply ice to the injured area:  Put ice in a plastic bag.  Place a towel between your skin and the bag.  Leave the ice on for 20 minutes, 2-3 times a day for the first 2-3 days. After that, ice and heat may be alternated to reduce pain and spasms.  Maintain a healthy weight. Excess weight puts extra stress on your back and makes it difficult to maintain good posture. SEEK MEDICAL CARE IF:  You have pain that is not relieved with rest or medicine.  You have increasing pain going down into the legs or buttocks.  You have pain that does not improve in one week.  You have night pain.  You lose weight.  You have a fever or chills. SEEK IMMEDIATE MEDICAL CARE IF:   You develop new bowel or bladder  control problems.  You have unusual weakness or numbness in your arms or legs.  You develop nausea or vomiting.  You develop abdominal pain.  You feel faint.   This information is not intended to replace advice given to you by your health care provider. Make sure you discuss any questions you have with your health care provider.   Document Released: 09/22/2005 Document Revised: 10/13/2014 Document Reviewed: 01/24/2014 Elsevier Interactive Patient Education [employer].

## 2015-09-05 NOTE — ED Provider Notes (Signed)
CSN: [id number]     Arrival date & time 09/05/15  1109 History  By signing my name below, I, Ronney Lion, attest that this documentation has been prepared under the direction and in the presence of Arthor Captain, PA-C. Electronically Signed: Ronney Lion, ED Scribe. 09/05/2015. 12:18 PM.    Chief Complaint  Patient presents with  . Back Pain   The history is provided by the patient. No language interpreter was used.    HPI Comments: William [name] is a 59 y.o. male with a history of hepatitis C and DM, who presents to the Emergency Department complaining of non-radiating, constant, aching, bilateral low back pain that began 3 days ago when he woke up. He states he thinks he twisted his back while sleeping. Sitting up straight, leaning forward, and leaning to the left all exacerbate his pain. Patient states he has taken oxycodone, which he takes for chronic knee pain, with no relief. He states he has also been prescribed Flexeril and Reglan, as muscle relaxants cause nausea. He states he can take Aleve without any GI side effects. Patient reports he has been told he has 4-5 "bad discs" in his back, although he is unable to specify where, and is scheduled to see a specialist next year in January 2017.  He denies extremity weakness, numbness, or paresthesia. He also denies bowel or bladder incontinence or any urinary symptoms, including dysuria.  PCP: Edwena Felty, MD    Past Medical History  Diagnosis Date  . Hypertension   . Diabetes mellitus without complication (HCC)   . Chronic hepatitis C without hepatic coma (HCC)   . History of cocaine use   . Depression   . Arthritis   . History of TIA (transient ischemic attack)   . Hyperlipidemia   . History of migraine headaches   . Obesity, Class II, BMI 35-39.9    Past Surgical History  Procedure Laterality Date  . Rotator  cuff surgery       left shoulder    Family History  Problem Relation Age of Onset  . Hypertension Mother   .  Diabetes Mother   . Heart disease Mother   . Thyroid disease Mother   . Hypertension Father   . Diabetes Father   . Heart disease Father    Social History  Substance Use Topics  . Smoking status: Current Every Day Smoker -- 0.10 packs/day for 30 years    Types: Cigarettes    Last Attempt to Quit: 09/27/2014  . Smokeless tobacco: None  . Alcohol Use: 0.0 oz/week    0 Standard drinks or equivalent per week    Review of Systems  Genitourinary: Negative for dysuria.  Musculoskeletal: Positive for back pain.  Neurological: Negative for weakness and numbness.    Allergies  Other and Prednisone  Home Medications   Prior to Admission medications   Medication Sig Start Date End Date Taking? Authorizing Provider  BD PEN NEEDLE NANO U/F 32G X 4 MM MISC USE WITH FLEXPEN TO ADMINISTER INSULIN 08/14/15   Edwena Felty, MD  cyclobenzaprine (FLEXERIL) 10 MG tablet Take 1 tablet (10 mg total) by mouth 3 (three) times daily as needed for muscle spasms. 05/30/15   Edwena Felty, MD  diltiazem (CARDIZEM SR) 90 MG 12 hr capsule Take 1 capsule (90 mg total) by mouth every 12 (twelve) hours. 08/11/15   Vassie Loll, MD  diphenhydrAMINE (BENADRYL) 25 MG tablet Take 1 tablet (25 mg total) by mouth every 6 (six) hours. As needed  with Compazine for headache. 10/27/14   Arby Barrette, MD  Elastic Bandages & Supports (THUMB BRACE) MISC 1 Device by Does not apply route daily as needed. 07/10/15   Edwena Felty, MD  gabapentin (NEURONTIN) 100 MG capsule Take 1 capsule (100 mg total) by mouth 3 (three) times daily. 05/30/15   Edwena Felty, MD  glucose blood (ACCU-CHEK AVIVA) test strip Use as instructed to check blood glucose 4x/day 05/30/15   Edwena Felty, MD  insulin aspart (NOVOLOG FLEXPEN) 100 UNIT/ML FlexPen Inject 15 Units into the skin 3 (three) times daily with meals. 08/11/15   Vassie Loll, MD  Insulin Glargine (LANTUS SOLOSTAR) 100 UNIT/ML Solostar Pen Inject 30 Units into the skin 2 (two)  times daily. 08/11/15   Vassie Loll, MD  Insulin Pen Needle (EASY TOUCH PEN NEEDLES) 32G X 6 MM MISC Use with Flexpen to administer Insulin 08/04/14   Radhika P Phadke, MD  Lancets (FREESTYLE) lancets Use as instructed to check blood glucose 4x/day 05/30/15   Edwena Felty, MD  metFORMIN (GLUCOPHAGE) 1000 MG tablet Take 1 tablet (1,000 mg total) by mouth 2 (two) times daily with a meal. 04/05/15   Edwena Felty, MD  metoCLOPramide (REGLAN) 10 MG tablet Take 1 tablet (10 mg total) by mouth 3 (three) times daily as needed for nausea (headache / nausea). 12/22/14   Garlon Hatchet, PA-C  oxyCODONE-acetaminophen (PERCOCET/ROXICET) 5-325 MG tablet Take 1 tablet by mouth every 12 (twelve) hours as needed for severe pain. 08/03/15   Edwena Felty, MD  pantoprazole (PROTONIX) 40 MG tablet Take 1 tablet (40 mg total) by mouth 2 (two) times daily. Twice a day for 10 days; then start using it once a day 08/11/15   Vassie Loll, MD  prochlorperazine (COMPAZINE) 10 MG tablet Take 1 tablet (10 mg total) by mouth 2 (two) times daily as needed (Headache). Take with Benadryl 25 mg. 10/27/14   Arby Barrette, MD  sucralfate (CARAFATE) 1 GM/10ML suspension Take 10 mLs (1 g total) by mouth 4 (four) times daily -  with meals and at bedtime. 08/11/15   Vassie Loll, MD  traZODone (DESYREL) 100 MG tablet Take 100 mg by mouth at bedtime as needed for sleep.  09/06/14   Historical Provider, MD   BP 153/115 mmHg  Pulse 104  Temp(Src) 98.1 F (36.7 C) (Oral)  Resp 16  SpO2 95% Physical Exam  Constitutional: He is oriented to person, place, and time. He appears well-developed and well-nourished. No distress.  HENT:  Head: Normocephalic and atraumatic.  Eyes: Conjunctivae and EOM are normal.  Neck: Neck supple. No tracheal deviation present.  Cardiovascular: Normal rate.   Pulmonary/Chest: Effort normal. No respiratory distress.  Musculoskeletal: Normal range of motion.  Muscle spasm to lumbar paraspinal muscles. Pain  is worse with lateral flexion.  Neurological: He is alert and oriented to person, place, and time.  Skin: Skin is warm and dry.  Psychiatric: He has a normal mood and affect. His behavior is normal.  Nursing note and vitals reviewed.   ED Course  Procedures (including critical care time)  DIAGNOSTIC STUDIES: Oxygen Saturation is 95% on RA, normal by my interpretation.    COORDINATION OF CARE: 12:06 PM - Discussed treatment plan with pt at bedside. Pt verbalized understanding and agreed to plan.   MDM   Final diagnoses:  Bilateral low back pain without sciatica  Essential hypertension   Patient with back pain.  No neurological deficits and normal neuro exam.  Patient can walk but states is  painful.  No loss of bowel or bladder control.  No concern for cauda equina.  No fever, night sweats, weight loss, h/o cancer, IVDU.  Will discharge home with naproxen. Advised massages at home. Pt verbalized understanding and agreed to plan.    I personally performed the services described in this documentation, which was scribed in my presence. The recorded information has been reviewed and is accurate.         Arthor Captainbigail Lada Fulbright, PA-C 09/05/15 1312  Raeford RazorStephen Kohut, MD 09/08/15 [phone]

## 2015-09-05 NOTE — ED Notes (Signed)
Patient states "I have 4 or 5 bad discs in my back".

## 2015-09-10 ENCOUNTER — Ambulatory Visit: Payer: Medicaid Other | Admitting: Family Medicine

## 2015-09-14 ENCOUNTER — Other Ambulatory Visit: Payer: Self-pay | Admitting: Family Medicine

## 2015-09-21 ENCOUNTER — Other Ambulatory Visit: Payer: Self-pay | Admitting: Gastroenterology

## 2015-09-21 DIAGNOSIS — K7689 Other specified diseases of liver: Secondary | ICD-10-CM

## 2015-09-24 ENCOUNTER — Other Ambulatory Visit: Payer: Self-pay

## 2015-09-24 NOTE — Telephone Encounter (Signed)
Patient stated he was given that medication for his hiatal hernia and he is running out so he is requesting a refill.  Refill request was sent to Dr. Edwena FeltyAshany Sundaram for approval and submission.

## 2015-09-25 MED ORDER — SUCRALFATE 1 GM/10ML PO SUSP: 1.0000 g | Freq: Three times a day (TID) | ORAL | Status: DC

## 2015-09-26 ENCOUNTER — Other Ambulatory Visit: Payer: Self-pay | Admitting: Gastroenterology

## 2015-09-26 DIAGNOSIS — B182 Chronic viral hepatitis C: Secondary | ICD-10-CM

## 2015-09-26 DIAGNOSIS — K7689 Other specified diseases of liver: Secondary | ICD-10-CM

## 2015-09-27 ENCOUNTER — Other Ambulatory Visit: Payer: Self-pay | Admitting: Neurosurgery

## 2015-09-27 DIAGNOSIS — M4802 Spinal stenosis, cervical region: Secondary | ICD-10-CM

## 2015-10-02 ENCOUNTER — Ambulatory Visit: Payer: Medicaid Other | Admitting: Family Medicine

## 2015-10-03 ENCOUNTER — Other Ambulatory Visit: Payer: Self-pay | Admitting: Family Medicine

## 2015-10-04 ENCOUNTER — Other Ambulatory Visit: Payer: Self-pay | Admitting: Family Medicine

## 2015-10-04 DIAGNOSIS — M2550 Pain in unspecified joint: Principal | ICD-10-CM

## 2015-10-04 DIAGNOSIS — G8929 Other chronic pain: Secondary | ICD-10-CM

## 2015-10-04 NOTE — Telephone Encounter (Signed)
Refill request was sent to Dr. Ashany Sundaram for approval and submission.  

## 2015-10-04 NOTE — Telephone Encounter (Signed)
Patient requesting a refill on oxycodone.  

## 2015-10-05 MED ORDER — OXYCODONE-ACETAMINOPHEN 5-325 MG PO TABS
1.0000 | ORAL_TABLET | Freq: Two times a day (BID) | ORAL | Status: DC | PRN
Start: 2015-10-05 — End: 2015-11-06

## 2015-10-11 DIAGNOSIS — F1010 Alcohol abuse, uncomplicated: Secondary | ICD-10-CM | POA: Insufficient documentation

## 2015-10-12 ENCOUNTER — Encounter: Payer: Self-pay | Admitting: *Deleted

## 2015-10-15 ENCOUNTER — Ambulatory Visit: Admission: RE | Admit: 2015-10-15 | Payer: Medicaid Other | Source: Ambulatory Visit

## 2015-10-16 ENCOUNTER — Ambulatory Visit: Payer: Medicaid Other | Admitting: Urology

## 2015-10-18 ENCOUNTER — Ambulatory Visit
Admission: RE | Admit: 2015-10-18 | Discharge: 2015-10-18 | Disposition: A | Discharge status: Home / Self Care | Payer: Medicaid Other | Source: Ambulatory Visit | Attending: Gastroenterology | Admitting: Gastroenterology

## 2015-10-18 DIAGNOSIS — M4802 Spinal stenosis, cervical region: Secondary | ICD-10-CM | POA: Insufficient documentation

## 2015-10-18 DIAGNOSIS — B182 Chronic viral hepatitis C: Secondary | ICD-10-CM | POA: Diagnosis present

## 2015-10-18 DIAGNOSIS — K7689 Other specified diseases of liver: Secondary | ICD-10-CM | POA: Diagnosis present

## 2015-10-19 ENCOUNTER — Ambulatory Visit
Admission: RE | Admit: 2015-10-19 | Discharge: 2015-10-19 | Disposition: A | Discharge status: Home / Self Care | Payer: Medicaid Other | Source: Ambulatory Visit | Attending: Neurosurgery | Admitting: Neurosurgery

## 2015-10-19 DIAGNOSIS — M4802 Spinal stenosis, cervical region: Secondary | ICD-10-CM

## 2015-10-23 ENCOUNTER — Ambulatory Visit: Payer: Medicaid Other | Admitting: Urology

## 2015-11-05 ENCOUNTER — Ambulatory Visit: Payer: Medicaid Other | Admitting: Family Medicine

## 2015-11-06 ENCOUNTER — Other Ambulatory Visit: Payer: Self-pay | Admitting: Family Medicine

## 2015-11-06 ENCOUNTER — Ambulatory Visit: Payer: Medicaid Other | Admitting: Family Medicine

## 2015-11-06 DIAGNOSIS — M2550 Pain in unspecified joint: Principal | ICD-10-CM

## 2015-11-06 DIAGNOSIS — G8929 Other chronic pain: Secondary | ICD-10-CM

## 2015-11-06 MED ORDER — CYCLOBENZAPRINE HCL 10 MG PO TABS: 10.0000 mg | ORAL_TABLET | Freq: Three times a day (TID) | ORAL | Status: DC | PRN

## 2015-11-06 MED ORDER — AMLODIPINE BESYLATE 10 MG PO TABS: 10.0000 mg | ORAL_TABLET | Freq: Every day | ORAL | Status: DC

## 2015-11-06 NOTE — Telephone Encounter (Signed)
Patient had appointment for today but had to reschedule because the provider called out sick, however he did reschedule for 11-16-15. He is needing refills on amlodipine, cyclobenzaprine, fluconazole (completely out) & oxycodone (completely out). [facility] [address] [facility]

## 2015-11-07 MED ORDER — OXYCODONE-ACETAMINOPHEN 5-325 MG PO TABS: 1.0000 | ORAL_TABLET | Freq: Two times a day (BID) | ORAL | Status: DC | PRN

## 2015-11-08 ENCOUNTER — Encounter: Payer: Self-pay | Admitting: Urology

## 2015-11-08 ENCOUNTER — Ambulatory Visit: Payer: Medicaid Other | Admitting: Urology

## 2015-11-08 ENCOUNTER — Other Ambulatory Visit: Payer: Self-pay | Admitting: Family Medicine

## 2015-11-12 ENCOUNTER — Ambulatory Visit: Payer: Medicaid Other | Admitting: Urology

## 2015-11-13 ENCOUNTER — Encounter: Admission: RE | Payer: Self-pay | Source: Ambulatory Visit

## 2015-11-13 ENCOUNTER — Ambulatory Visit: Admission: RE | Admit: 2015-11-13 | Payer: Medicaid Other | Source: Ambulatory Visit | Admitting: Gastroenterology

## 2015-11-13 SURGERY — COLONOSCOPY WITH PROPOFOL
Anesthesia: General

## 2015-11-16 ENCOUNTER — Ambulatory Visit: Payer: Medicaid Other | Admitting: Family Medicine

## 2015-11-20 ENCOUNTER — Other Ambulatory Visit: Payer: Self-pay | Admitting: Neurosurgery

## 2015-11-22 ENCOUNTER — Ambulatory Visit: Payer: Medicaid Other | Admitting: Urology

## 2015-11-29 ENCOUNTER — Encounter (HOSPITAL_COMMUNITY): Payer: Self-pay

## 2015-11-29 ENCOUNTER — Encounter (HOSPITAL_COMMUNITY)
Admission: RE | Admit: 2015-11-29 | Discharge: 2015-11-29 | Disposition: A | Discharge status: Home / Self Care | Payer: Medicaid Other | Source: Ambulatory Visit | Attending: Neurosurgery | Admitting: Neurosurgery

## 2015-11-29 DIAGNOSIS — Z01812 Encounter for preprocedural laboratory examination: Secondary | ICD-10-CM | POA: Insufficient documentation

## 2015-11-29 DIAGNOSIS — Z8673 Personal history of transient ischemic attack (TIA), and cerebral infarction without residual deficits: Secondary | ICD-10-CM | POA: Diagnosis not present

## 2015-11-29 DIAGNOSIS — B182 Chronic viral hepatitis C: Secondary | ICD-10-CM | POA: Diagnosis not present

## 2015-11-29 DIAGNOSIS — Z79899 Other long term (current) drug therapy: Secondary | ICD-10-CM | POA: Diagnosis not present

## 2015-11-29 DIAGNOSIS — E119 Type 2 diabetes mellitus without complications: Secondary | ICD-10-CM | POA: Diagnosis not present

## 2015-11-29 DIAGNOSIS — Z01818 Encounter for other preprocedural examination: Secondary | ICD-10-CM | POA: Diagnosis present

## 2015-11-29 DIAGNOSIS — M4802 Spinal stenosis, cervical region: Secondary | ICD-10-CM | POA: Insufficient documentation

## 2015-11-29 DIAGNOSIS — I10 Essential (primary) hypertension: Secondary | ICD-10-CM | POA: Diagnosis not present

## 2015-11-29 DIAGNOSIS — Z794 Long term (current) use of insulin: Secondary | ICD-10-CM | POA: Insufficient documentation

## 2015-11-29 DIAGNOSIS — E785 Hyperlipidemia, unspecified: Secondary | ICD-10-CM | POA: Insufficient documentation

## 2015-11-29 LAB — CBC WITH DIFFERENTIAL/PLATELET
BASOS PCT: 0 %
Basophils Absolute: 0 10*3/uL (ref 0.0–0.1)
EOS ABS: 0.2 10*3/uL (ref 0.0–0.7)
EOS PCT: 2 %
HCT: 44.5 % (ref 39.0–52.0)
Hemoglobin: 14.9 g/dL (ref 13.0–17.0)
LYMPHS ABS: 4.2 10*3/uL — AB (ref 0.7–4.0)
Lymphocytes Relative: 63 %
MCH: 29.2 pg (ref 26.0–34.0)
MCHC: 33.5 g/dL (ref 30.0–36.0)
MCV: 87.3 fL (ref 78.0–100.0)
MONO ABS: 0.3 10*3/uL (ref 0.1–1.0)
MONOS PCT: 5 %
Neutro Abs: 2 10*3/uL (ref 1.7–7.7)
Neutrophils Relative %: 30 %
PLATELETS: 202 10*3/uL (ref 150–400)
RBC: 5.1 MIL/uL (ref 4.22–5.81)
RDW: 14.1 % (ref 11.5–15.5)
WBC: 6.7 10*3/uL (ref 4.0–10.5)

## 2015-11-29 LAB — BASIC METABOLIC PANEL
ANION GAP: 10 (ref 5–15)
BUN: 10 mg/dL (ref 6–20)
CALCIUM: 9.3 mg/dL (ref 8.9–10.3)
CHLORIDE: 103 mmol/L (ref 101–111)
CO2: 24 mmol/L (ref 22–32)
Creatinine, Ser: 0.87 mg/dL (ref 0.61–1.24)
GFR calc Af Amer: >60 mL/min (ref >60)
GFR calc non Af Amer: >60 mL/min (ref >60)
GLUCOSE: 194 mg/dL — AB (ref 65–99)
POTASSIUM: 3.9 mmol/L (ref 3.5–5.1)
Sodium: 137 mmol/L (ref 135–145)

## 2015-11-29 LAB — SURGICAL PCR SCREEN
MRSA, PCR: NEGATIVE
Staphylococcus aureus: POSITIVE — AB

## 2015-11-29 LAB — GLUCOSE, CAPILLARY: GLUCOSE-CAPILLARY: 199 mg/dL — AB (ref 65–99)

## 2015-11-29 NOTE — Pre-Procedure Instructions (Signed)
Jill Side  11/29/2015      [facility] - [place],  - [address] [place] [address] Phone: [phone] Fax: [phone]  [id number] [place], [place] - [address] [place] [address] Phone: [phone] Fax: [phone]  [facility] [phone] [place], [place] - [address] [place] [address] Phone: [phone] Fax: [phone]    Your procedure is scheduled on 12/05/15.  Report to [facility] Admitting at 630 A.M.  Call this number if you have problems the morning of surgery:  [phone]   Remember:  Do not eat food or drink liquids after midnight.  Take these medicines the morning of surgery with A SIP OF WATER --norvasc,diltiazem,neurontin,percocet,relgan   Do not wear jewelry, make-up or nail polish.  Do not wear lotions, powders, or perfumes.  You may wear deodorant.  Do not shave 48 hours prior to surgery.  Men may shave face and neck.  Do not bring valuables to the hospital.  [facility] is not responsible for any belongings or valuables.  Contacts, dentures or bridgework may not be worn into surgery.  Leave your suitcase in the car.  After surgery it may be brought to your room.  For patients admitted to the hospital, discharge time will be determined by your treatment team.  Patients discharged the day of surgery will not be allowed to drive home.   Name and phone number of your driver:   Special instructions:   Please read over the following fact sheets that you were given. Pain Booklet, Coughing and Deep Breathing and Surgical Site Infection Prevention How to Manage Your Diabetes Before Surgery   Why is it important to control my blood sugar before and after surgery?   Improving blood sugar levels before and after surgery helps healing and can limit problems.  A way of improving blood sugar control is eating a healthy diet  by:  - Eating less sugar and carbohydrates  - Increasing activity/exercise  - Talk with your doctor about reaching your blood sugar goals  High blood sugars (greater than 180 mg/dL) can raise your risk of infections and slow down your recovery so you will need to focus on controlling your diabetes during the weeks before surgery.  Make sure that the doctor who takes care of your diabetes knows about your planned surgery including the date and location.  How do I manage my blood sugars before surgery?   Check your blood sugar at least 4 times a day, 2 days before surgery to make sure that they are not too high or low.   Check your blood sugar the morning of your surgery when you wake up and every 2               hours until you get to the Short-Stay unit.  If your blood sugar is less than 70 mg/dL, you will need to treat for low blood sugar by:  Treat a low blood sugar (less than 70 mg/dL) with 1/2 cup of clear juice (cranberry or apple), 4 glucose tablets, OR glucose gel.  Recheck blood sugar in 15 minutes after treatment (to make sure it is greater than 70 mg/dL).  If blood sugar is not greater than 70 mg/dL on re-check, call [phone] for further instructions.   Report your blood sugar to the Short-Stay nurse when you get to Short-Stay.  References:  [facility], 2007 "How to Manage your Diabetes Before and After Surgery".  What do I do about my diabetes medications?   Do not take oral diabetes medicines (pills) the morning of surgery.  THE NIGHT BEFORE SURGERY, take 40 units of lantus Insu    Do not take other diabetes injectables the day of surgery including Byetta, Victoza, Bydureon, and Trulicity.    If your CBG is greater than 220 mg/dL, you may take 1/2 of your sliding scale (correction) dose of insulin.   For patients with "Insulin Pumps":  Contact your diabetes doctor for specific instructions before surgery.   Decrease basal  insulin rates by 20% at midnight the night before surgery.  Note that if your surgery is planned to be longer than 2 hours, your insulin pump will be removed and intravenous (IV) insulin will be started and managed by the nurses and anesthesiologist.  You will be able to restart your insulin pump once you are awake and able to manage it.  Make sure to bring insulin pump supplies to the hospital with you in case your site needs to be changed.

## 2015-11-29 NOTE — Progress Notes (Signed)
[facility] [address], [place].

## 2015-11-30 LAB — HEMOGLOBIN A1C
HEMOGLOBIN A1C: 11.1 % — AB (ref 4.8–5.6)
MEAN PLASMA GLUCOSE: 272 mg/dL

## 2015-12-03 NOTE — Progress Notes (Signed)
Chart sent to [facility] for follow up d/t A1C

## 2015-12-03 NOTE — Progress Notes (Addendum)
Anesthesia Chart Review: Patient is a 60 year old male scheduled for C3-4, C4-5, C5-6 ACDF on 12/05/15 by Dr. Jordan Likes. PAT was 11/29/15. Chart was brought to be just today to review. Patient reports that Dr. Jordan Likes indicated that surgery was of a somewhat urgent nature due to the degree of his cervical disease.   History includes smoking, HTN, DM2, chronic hepatitis C (Dr. Barnetta Chapel, [facility], last visit 11/09/15), depression, HLD, TIA ('90's; reported was told due to "heat"), migraines, BPH, prior use of cocaine (2014; + UDS 08/09/15 during admission for DKA in the setting of steroid use). BMI is consistent with obesity. GI is holding on plans for endoscopies until his c-spine issues have been addressed by neurosurgery. PCP has been Dr. Edwena Felty with [facility], however, she is moving to [place]. He is scheduled to see Dr. Primitivo Gauze with [facility].   Meds include amlodipine, Excedrin Migraine, Flexeril, diltiazem, Neurontin, NovoLog 25 Units AC, Lantus 50 Units HS, Reglan, Percocet, Protonix, Carafate, Compazine, trazodone. He reports being compliant with DM meds.   08/10/15 EKG (during admission for DKA): ST at 118 bpm, LAD, inferior infarct (age undetermined), cannot rule out anterior infarct (age undetermined). Overall, I think EKG is stable when compared to tracings from January 13, 2015 and 03/08/15, although he did have minimal ST elevation (most pronounced in V2) on 01-13-2015 tracing. He denied chest pain, SOB, edema. He denied cardiac testing such as echo, stress, cath. He reports a remote history of a normal Holter monitor. He reported an episode of syncope > 3 years ago believed secondary to either hypo- or hyperglycemia. Both his parents had a history of CAD--his mother ~ age 87, his father ~ age 9. Patient reports he is not very active, particularly since he was diagnosed with cervical stenosis. He has had neck and bilateral arm pain with weakness/numbness (difficultly grabbing/holding on to objects).  Within the past six months, he has been able to do some housework like vacuuming and dishes without CV symptoms.   10/19/15 MRI c-spine: IMPRESSION: - Multilevel spondylosis as described. Moderate to severe stenosis at C3-4, C4-5, and C5-6. - Abnormal cord signal reflecting vascular compromise or previous injury, most pronounced opposite the C4-5 level above. - BILATERAL neural impingement is present at multiple levels, but with regard to the right-sided symptoms, is most likely to derive from pathology at the C5-6 interspace.  Preoperative labs noted. Glucose 194, but A1c elevated at 11.1, consistent with mean plasma glucose of 272. He reports lately fasting glucose has been running in the 180's, but can occasional run as high as 250. I notified patient that an elevated glucose, particularly when up to 250 or higher may delay or cancel his surgery. Unfortunately, LFTs were not done at his PAT visit. They were normal on 07/17/15 (Care Everywhere), but AST 52, ALT 246, total bilirubin 1.7 on 08/09/15 during acute admission for DKA. Hopefully, without acute illness they will be back to normal again.  Reviewed above with anesthesiologist Dr. Hart Rochester. Patient will get a CBG, HFP, and a new EKG (since 12/2014 showed a degree of ST elevation) on arrival tomorrow. If results are felt acceptable then it is anticipated that he can proceed as planned. Further evaluation on the day of surgery by his surgeon and anesthesiologist. I updated Erie Noe at Dr. Lindalou Hose office and notified her of poorly controlled DM.  Velna Ochs [facility] Short Stay Center/Anesthesiology Phone [phone] 12/03/2015 2:40 PM

## 2015-12-05 ENCOUNTER — Encounter (HOSPITAL_COMMUNITY): Payer: Self-pay | Admitting: *Deleted

## 2015-12-05 ENCOUNTER — Inpatient Hospital Stay (HOSPITAL_COMMUNITY): Payer: Medicaid Other | Admitting: Vascular Surgery

## 2015-12-05 ENCOUNTER — Inpatient Hospital Stay (HOSPITAL_COMMUNITY): Payer: Medicaid Other | Admitting: Anesthesiology

## 2015-12-05 ENCOUNTER — Inpatient Hospital Stay (HOSPITAL_COMMUNITY)
Admission: RE | Admit: 2015-12-05 | Discharge: 2015-12-07 | DRG: 472 | Disposition: A | Discharge status: Home / Self Care | Payer: Medicaid Other | Source: Ambulatory Visit | Attending: Neurosurgery | Admitting: Neurosurgery

## 2015-12-05 ENCOUNTER — Inpatient Hospital Stay (HOSPITAL_COMMUNITY): Payer: Medicaid Other

## 2015-12-05 ENCOUNTER — Encounter (HOSPITAL_COMMUNITY)
Admission: RE | Disposition: A | Discharge status: Home / Self Care | Payer: Self-pay | Source: Ambulatory Visit | Attending: Neurosurgery

## 2015-12-05 DIAGNOSIS — M2550 Pain in unspecified joint: Secondary | ICD-10-CM

## 2015-12-05 DIAGNOSIS — Z79899 Other long term (current) drug therapy: Secondary | ICD-10-CM

## 2015-12-05 DIAGNOSIS — I10 Essential (primary) hypertension: Secondary | ICD-10-CM | POA: Diagnosis present

## 2015-12-05 DIAGNOSIS — Z6835 Body mass index (BMI) 35.0-35.9, adult: Secondary | ICD-10-CM

## 2015-12-05 DIAGNOSIS — E669 Obesity, unspecified: Secondary | ICD-10-CM | POA: Diagnosis present

## 2015-12-05 DIAGNOSIS — F17210 Nicotine dependence, cigarettes, uncomplicated: Secondary | ICD-10-CM | POA: Diagnosis present

## 2015-12-05 DIAGNOSIS — Z419 Encounter for procedure for purposes other than remedying health state, unspecified: Secondary | ICD-10-CM

## 2015-12-05 DIAGNOSIS — M25812 Other specified joint disorders, left shoulder: Secondary | ICD-10-CM | POA: Diagnosis not present

## 2015-12-05 DIAGNOSIS — M4802 Spinal stenosis, cervical region: Principal | ICD-10-CM | POA: Diagnosis present

## 2015-12-05 DIAGNOSIS — M5010 Cervical disc disorder with radiculopathy, unspecified cervical region: Secondary | ICD-10-CM | POA: Diagnosis not present

## 2015-12-05 DIAGNOSIS — Z888 Allergy status to other drugs, medicaments and biological substances status: Secondary | ICD-10-CM | POA: Diagnosis not present

## 2015-12-05 DIAGNOSIS — Z8673 Personal history of transient ischemic attack (TIA), and cerebral infarction without residual deficits: Secondary | ICD-10-CM

## 2015-12-05 DIAGNOSIS — M25811 Other specified joint disorders, right shoulder: Secondary | ICD-10-CM | POA: Diagnosis not present

## 2015-12-05 DIAGNOSIS — M4712 Other spondylosis with myelopathy, cervical region: Secondary | ICD-10-CM | POA: Diagnosis present

## 2015-12-05 DIAGNOSIS — Z794 Long term (current) use of insulin: Secondary | ICD-10-CM | POA: Diagnosis not present

## 2015-12-05 DIAGNOSIS — R531 Weakness: Secondary | ICD-10-CM | POA: Diagnosis present

## 2015-12-05 DIAGNOSIS — E119 Type 2 diabetes mellitus without complications: Secondary | ICD-10-CM | POA: Diagnosis present

## 2015-12-05 DIAGNOSIS — Z885 Allergy status to narcotic agent status: Secondary | ICD-10-CM

## 2015-12-05 DIAGNOSIS — G8929 Other chronic pain: Secondary | ICD-10-CM

## 2015-12-05 HISTORY — PX: ANTERIOR CERVICAL DECOMP/DISCECTOMY FUSION: SHX1161

## 2015-12-05 LAB — GLUCOSE, CAPILLARY
GLUCOSE-CAPILLARY: 187 mg/dL — AB (ref 65–99)
GLUCOSE-CAPILLARY: 281 mg/dL — AB (ref 65–99)
Glucose-Capillary: 183 mg/dL — ABNORMAL HIGH (ref 65–99)
Glucose-Capillary: 287 mg/dL — ABNORMAL HIGH (ref 65–99)
Glucose-Capillary: 294 mg/dL — ABNORMAL HIGH (ref 65–99)
Glucose-Capillary: 232 mg/dL — ABNORMAL HIGH (ref 65–99)

## 2015-12-05 LAB — HEPATIC FUNCTION PANEL
ALT: 21 U/L (ref 17–63)
AST: 41 U/L (ref 15–41)
Albumin: 3.7 g/dL (ref 3.5–5.0)
Alkaline Phosphatase: 67 U/L (ref 38–126)
BILIRUBIN INDIRECT: 0.4 mg/dL (ref 0.3–0.9)
Bilirubin, Direct: 0.5 mg/dL (ref 0.1–0.5)
TOTAL PROTEIN: 6.8 g/dL (ref 6.5–8.1)
Total Bilirubin: 0.9 mg/dL (ref 0.3–1.2)

## 2015-12-05 SURGERY — ANTERIOR CERVICAL DECOMPRESSION/DISCECTOMY FUSION 3 LEVELS
Anesthesia: General | Site: Neck

## 2015-12-05 MED ORDER — CEFAZOLIN SODIUM-DEXTROSE 2-3 GM-% IV SOLR
2.0000 g | INTRAVENOUS | Status: AC
  Administered 2015-12-05: 2 g via INTRAVENOUS
  Filled 2015-12-05: qty 50

## 2015-12-05 MED ORDER — PROPOFOL 10 MG/ML IV BOLUS
INTRAVENOUS | Status: DC | PRN
  Administered 2015-12-05: 170 mg via INTRAVENOUS

## 2015-12-05 MED ORDER — DIPHENHYDRAMINE HCL 25 MG PO CAPS
25.0000 mg | ORAL_CAPSULE | Freq: Four times a day (QID) | ORAL | Status: DC | PRN
  Administered 2015-12-06 – 2015-12-07 (×3): 25 mg via ORAL
  Filled 2015-12-05 (×6): qty 1

## 2015-12-05 MED ORDER — PHENYLEPHRINE 40 MCG/ML (10ML) SYRINGE FOR IV PUSH (FOR BLOOD PRESSURE SUPPORT)
PREFILLED_SYRINGE | INTRAVENOUS | Status: AC
  Filled 2015-12-05: qty 10

## 2015-12-05 MED ORDER — THROMBIN 20000 UNITS EX KIT
PACK | CUTANEOUS | Status: DC | PRN
  Administered 2015-12-05: 10:00:00 via TOPICAL

## 2015-12-05 MED ORDER — SUCCINYLCHOLINE CHLORIDE 20 MG/ML IJ SOLN
INTRAMUSCULAR | Status: AC
  Filled 2015-12-05: qty 1

## 2015-12-05 MED ORDER — LIDOCAINE HCL (CARDIAC) 20 MG/ML IV SOLN
INTRAVENOUS | Status: AC
  Filled 2015-12-05: qty 5

## 2015-12-05 MED ORDER — AMLODIPINE BESYLATE 10 MG PO TABS
10.0000 mg | ORAL_TABLET | Freq: Every day | ORAL | Status: DC
  Administered 2015-12-05 – 2015-12-07 (×3): 10 mg via ORAL
  Filled 2015-12-05 (×3): qty 1

## 2015-12-05 MED ORDER — LACTATED RINGERS IV SOLN
INTRAVENOUS | Status: DC | PRN
Start: 2015-12-05 — End: 2015-12-05
  Administered 2015-12-05 (×2): via INTRAVENOUS

## 2015-12-05 MED ORDER — HYDROMORPHONE HCL 1 MG/ML IJ SOLN
0.5000 mg | INTRAMUSCULAR | Status: DC | PRN
  Administered 2015-12-05 (×2): 1 mg via INTRAVENOUS
  Filled 2015-12-05 (×2): qty 1

## 2015-12-05 MED ORDER — GLYCOPYRROLATE 0.2 MG/ML IJ SOLN
INTRAMUSCULAR | Status: AC
  Filled 2015-12-05: qty 1

## 2015-12-05 MED ORDER — OXYCODONE-ACETAMINOPHEN 5-325 MG PO TABS: 1.0000 | ORAL_TABLET | Freq: Two times a day (BID) | ORAL | Status: DC | PRN

## 2015-12-05 MED ORDER — CEFAZOLIN SODIUM 1-5 GM-% IV SOLN
1.0000 g | Freq: Three times a day (TID) | INTRAVENOUS | Status: AC
  Administered 2015-12-05 – 2015-12-06 (×2): 1 g via INTRAVENOUS
  Filled 2015-12-05 (×3): qty 50

## 2015-12-05 MED ORDER — VECURONIUM BROMIDE 10 MG IV SOLR
INTRAVENOUS | Status: DC | PRN
  Administered 2015-12-05: 3 mg via INTRAVENOUS
  Administered 2015-12-05: 1 mg via INTRAVENOUS

## 2015-12-05 MED ORDER — HYDROMORPHONE HCL 1 MG/ML IJ SOLN: 0.2500 mg | INTRAMUSCULAR | Status: DC | PRN

## 2015-12-05 MED ORDER — PROCHLORPERAZINE MALEATE 10 MG PO TABS: 10.0000 mg | ORAL_TABLET | Freq: Two times a day (BID) | ORAL | Status: DC | PRN

## 2015-12-05 MED ORDER — PHENOL 1.4 % MT LIQD
1.0000 | OROMUCOSAL | Status: DC | PRN
Start: 2015-12-05 — End: 2015-12-07
  Filled 2015-12-05: qty 177

## 2015-12-05 MED ORDER — INSULIN ASPART 100 UNIT/ML ~~LOC~~ SOLN
0.0000 [IU] | Freq: Three times a day (TID) | SUBCUTANEOUS | Status: DC
  Administered 2015-12-05: 11 [IU] via SUBCUTANEOUS
  Administered 2015-12-06 (×2): 15 [IU] via SUBCUTANEOUS
  Administered 2015-12-07: 4 [IU] via SUBCUTANEOUS
  Administered 2015-12-07 (×2): 11 [IU] via SUBCUTANEOUS

## 2015-12-05 MED ORDER — HYDROCODONE-ACETAMINOPHEN 5-325 MG PO TABS
1.0000 | ORAL_TABLET | ORAL | Status: DC | PRN
  Filled 2015-12-05: qty 1

## 2015-12-05 MED ORDER — CYCLOBENZAPRINE HCL 10 MG PO TABS: 10.0000 mg | ORAL_TABLET | Freq: Three times a day (TID) | ORAL | Status: DC | PRN

## 2015-12-05 MED ORDER — EPHEDRINE SULFATE 50 MG/ML IJ SOLN
INTRAMUSCULAR | Status: AC
  Filled 2015-12-05: qty 1

## 2015-12-05 MED ORDER — 0.9 % SODIUM CHLORIDE (POUR BTL) OPTIME
TOPICAL | Status: DC | PRN
  Administered 2015-12-05: 1000 mL

## 2015-12-05 MED ORDER — THROMBIN 5000 UNITS EX SOLR
OROMUCOSAL | Status: DC | PRN
  Administered 2015-12-05: 11:00:00 via TOPICAL

## 2015-12-05 MED ORDER — ROCURONIUM BROMIDE 50 MG/5ML IV SOLN
INTRAVENOUS | Status: AC
  Filled 2015-12-05: qty 1

## 2015-12-05 MED ORDER — OXYCODONE HCL 5 MG/5ML PO SOLN: 5.0000 mg | Freq: Once | ORAL | Status: DC | PRN

## 2015-12-05 MED ORDER — OXYCODONE HCL 5 MG PO TABS: 5.0000 mg | ORAL_TABLET | Freq: Once | ORAL | Status: DC | PRN

## 2015-12-05 MED ORDER — ACETAMINOPHEN 650 MG RE SUPP: 650.0000 mg | RECTAL | Status: DC | PRN

## 2015-12-05 MED ORDER — ACETAMINOPHEN 325 MG PO TABS
650.0000 mg | ORAL_TABLET | ORAL | Status: DC | PRN
  Administered 2015-12-06: 650 mg via ORAL
  Filled 2015-12-05: qty 2

## 2015-12-05 MED ORDER — INSULIN GLARGINE 100 UNIT/ML ~~LOC~~ SOLN
50.0000 [IU] | Freq: Every day | SUBCUTANEOUS | Status: DC
  Administered 2015-12-05 – 2015-12-06 (×2): 50 [IU] via SUBCUTANEOUS
  Filled 2015-12-05 (×3): qty 0.5

## 2015-12-05 MED ORDER — METOCLOPRAMIDE HCL 10 MG PO TABS
10.0000 mg | ORAL_TABLET | Freq: Three times a day (TID) | ORAL | Status: DC | PRN
  Administered 2015-12-05: 10 mg via ORAL
  Filled 2015-12-05: qty 1

## 2015-12-05 MED ORDER — TRAZODONE HCL 100 MG PO TABS: 100.0000 mg | ORAL_TABLET | Freq: Every evening | ORAL | Status: DC | PRN

## 2015-12-05 MED ORDER — ONDANSETRON HCL 4 MG/2ML IJ SOLN
4.0000 mg | INTRAMUSCULAR | Status: DC | PRN
  Administered 2015-12-05 (×2): 4 mg via INTRAVENOUS
  Filled 2015-12-05 (×2): qty 2

## 2015-12-05 MED ORDER — SUGAMMADEX SODIUM 200 MG/2ML IV SOLN
INTRAVENOUS | Status: AC
  Filled 2015-12-05: qty 2

## 2015-12-05 MED ORDER — INSULIN ASPART 100 UNIT/ML ~~LOC~~ SOLN
25.0000 [IU] | Freq: Three times a day (TID) | SUBCUTANEOUS | Status: DC
  Administered 2015-12-06 – 2015-12-07 (×6): 25 [IU] via SUBCUTANEOUS

## 2015-12-05 MED ORDER — LACTATED RINGERS IV SOLN
INTRAVENOUS | Status: DC | PRN
  Administered 2015-12-05: 08:00:00 via INTRAVENOUS

## 2015-12-05 MED ORDER — SODIUM CHLORIDE 0.9 % IV SOLN: 250.0000 mL | INTRAVENOUS | Status: DC

## 2015-12-05 MED ORDER — ONDANSETRON HCL 4 MG/2ML IJ SOLN
INTRAMUSCULAR | Status: DC | PRN
  Administered 2015-12-05: 4 mg via INTRAVENOUS

## 2015-12-05 MED ORDER — SODIUM CHLORIDE 0.9 % IR SOLN
Status: DC | PRN
  Administered 2015-12-05: 10:00:00

## 2015-12-05 MED ORDER — SODIUM CHLORIDE 0.9% FLUSH: 3.0000 mL | INTRAVENOUS | Status: DC | PRN

## 2015-12-05 MED ORDER — VECURONIUM BROMIDE 10 MG IV SOLR
INTRAVENOUS | Status: AC
  Filled 2015-12-05: qty 10

## 2015-12-05 MED ORDER — DEXAMETHASONE SODIUM PHOSPHATE 4 MG/ML IJ SOLN
4.0000 mg | Freq: Four times a day (QID) | INTRAMUSCULAR | Status: DC
  Administered 2015-12-06 – 2015-12-07 (×8): 4 mg via INTRAVENOUS
  Filled 2015-12-05 (×8): qty 1

## 2015-12-05 MED ORDER — ONDANSETRON HCL 4 MG/2ML IJ SOLN
INTRAMUSCULAR | Status: AC
  Filled 2015-12-05: qty 2

## 2015-12-05 MED ORDER — MIDAZOLAM HCL 2 MG/2ML IJ SOLN
INTRAMUSCULAR | Status: AC
  Filled 2015-12-05: qty 2

## 2015-12-05 MED ORDER — LIDOCAINE HCL (CARDIAC) 20 MG/ML IV SOLN
INTRAVENOUS | Status: DC | PRN
  Administered 2015-12-05: 70 mg via INTRAVENOUS
  Administered 2015-12-05: 80 mg via INTRATRACHEAL

## 2015-12-05 MED ORDER — GABAPENTIN 100 MG PO CAPS
100.0000 mg | ORAL_CAPSULE | Freq: Three times a day (TID) | ORAL | Status: DC
  Administered 2015-12-05 – 2015-12-07 (×7): 100 mg via ORAL
  Filled 2015-12-05 (×8): qty 1

## 2015-12-05 MED ORDER — PANTOPRAZOLE SODIUM 40 MG PO TBEC
40.0000 mg | DELAYED_RELEASE_TABLET | Freq: Every day | ORAL | Status: DC
  Administered 2015-12-05 – 2015-12-07 (×3): 40 mg via ORAL
  Filled 2015-12-05 (×3): qty 1

## 2015-12-05 MED ORDER — ONDANSETRON HCL 4 MG/2ML IJ SOLN: 4.0000 mg | Freq: Four times a day (QID) | INTRAMUSCULAR | Status: DC | PRN

## 2015-12-05 MED ORDER — FENTANYL CITRATE (PF) 250 MCG/5ML IJ SOLN
INTRAMUSCULAR | Status: AC
  Filled 2015-12-05: qty 5

## 2015-12-05 MED ORDER — PROPOFOL 10 MG/ML IV BOLUS
INTRAVENOUS | Status: AC
  Filled 2015-12-05: qty 20

## 2015-12-05 MED ORDER — SUGAMMADEX SODIUM 200 MG/2ML IV SOLN
INTRAVENOUS | Status: DC | PRN
  Administered 2015-12-05: 200 mg via INTRAVENOUS

## 2015-12-05 MED ORDER — SODIUM CHLORIDE 0.9% FLUSH
3.0000 mL | Freq: Two times a day (BID) | INTRAVENOUS | Status: DC
  Administered 2015-12-06 – 2015-12-07 (×3): 3 mL via INTRAVENOUS

## 2015-12-05 MED ORDER — SODIUM CHLORIDE 0.9 % IJ SOLN
INTRAMUSCULAR | Status: AC
  Filled 2015-12-05: qty 10

## 2015-12-05 MED ORDER — MENTHOL 3 MG MT LOZG: 1.0000 | LOZENGE | OROMUCOSAL | Status: DC | PRN

## 2015-12-05 MED ORDER — STERILE WATER FOR INJECTION IJ SOLN
INTRAMUSCULAR | Status: AC
  Filled 2015-12-05: qty 10

## 2015-12-05 MED ORDER — FENTANYL CITRATE (PF) 100 MCG/2ML IJ SOLN
INTRAMUSCULAR | Status: DC | PRN
  Administered 2015-12-05: 25 ug via INTRAVENOUS
  Administered 2015-12-05 (×4): 50 ug via INTRAVENOUS
  Administered 2015-12-05: 150 ug via INTRAVENOUS

## 2015-12-05 MED ORDER — MIDAZOLAM HCL 5 MG/5ML IJ SOLN
INTRAMUSCULAR | Status: DC | PRN
  Administered 2015-12-05: 2 mg via INTRAVENOUS

## 2015-12-05 MED ORDER — SUCRALFATE 1 GM/10ML PO SUSP
1.0000 g | Freq: Three times a day (TID) | ORAL | Status: DC
  Administered 2015-12-05 – 2015-12-07 (×8): 1 g via ORAL
  Filled 2015-12-05 (×9): qty 10

## 2015-12-05 MED ORDER — ROCURONIUM BROMIDE 100 MG/10ML IV SOLN
INTRAVENOUS | Status: DC | PRN
  Administered 2015-12-05: 50 mg via INTRAVENOUS

## 2015-12-05 MED ORDER — OXYCODONE-ACETAMINOPHEN 5-325 MG PO TABS: 1.0000 | ORAL_TABLET | ORAL | Status: DC | PRN

## 2015-12-05 MED ORDER — DEXTROSE 5 % IV SOLN
10.0000 mg | INTRAVENOUS | Status: DC | PRN
  Administered 2015-12-05: 10 ug/min via INTRAVENOUS

## 2015-12-05 SURGICAL SUPPLY — 59 items
BAG DECANTER FOR FLEXI CONT (MISCELLANEOUS) ×3 IMPLANT
BENZOIN TINCTURE PRP APPL 2/3 (GAUZE/BANDAGES/DRESSINGS) ×3 IMPLANT
BIT DRILL 13 (BIT) ×2 IMPLANT
BIT DRILL 13MM (BIT) ×1
BRUSH SCRUB EZ PLAIN DRY (MISCELLANEOUS) ×3 IMPLANT
BUR MATCHSTICK NEURO 3.0 LAGG (BURR) ×3 IMPLANT
CAGE PEEK 7X14X11 (Cage) ×4 IMPLANT
CANISTER SUCT 3000ML PPV (MISCELLANEOUS) ×3 IMPLANT
CLOSURE WOUND 1/2 X4 (GAUZE/BANDAGES/DRESSINGS) ×1
DRAPE C-ARM 42X72 X-RAY (DRAPES) ×6 IMPLANT
DRAPE LAPAROTOMY 100X72 PEDS (DRAPES) ×3 IMPLANT
DRAPE MICROSCOPE LEICA (MISCELLANEOUS) ×3 IMPLANT
DRAPE POUCH INSTRU U-SHP 10X18 (DRAPES) ×3 IMPLANT
DURAPREP 6ML APPLICATOR 50/CS (WOUND CARE) ×3 IMPLANT
ELECT COATED BLADE 2.86 ST (ELECTRODE) ×3 IMPLANT
ELECT REM PT RETURN 9FT ADLT (ELECTROSURGICAL) ×3
ELECTRODE REM PT RTRN 9FT ADLT (ELECTROSURGICAL) ×1 IMPLANT
GAUZE SPONGE 4X4 12PLY STRL (GAUZE/BANDAGES/DRESSINGS) ×3 IMPLANT
GAUZE SPONGE 4X4 16PLY XRAY LF (GAUZE/BANDAGES/DRESSINGS) IMPLANT
GLOVE BIO SURGEON STRL SZ8 (GLOVE) ×3 IMPLANT
GLOVE BIOGEL PI IND STRL 7.0 (GLOVE) ×4 IMPLANT
GLOVE BIOGEL PI INDICATOR 7.0 (GLOVE) ×8
GLOVE ECLIPSE 9.0 STRL (GLOVE) ×3 IMPLANT
GLOVE EXAM NITRILE LRG STRL (GLOVE) IMPLANT
GLOVE EXAM NITRILE MD LF STRL (GLOVE) IMPLANT
GLOVE EXAM NITRILE XL STR (GLOVE) IMPLANT
GLOVE EXAM NITRILE XS STR PU (GLOVE) IMPLANT
GLOVE INDICATOR 8.5 STRL (GLOVE) ×3 IMPLANT
GOWN STRL REUS W/ TWL LRG LVL3 (GOWN DISPOSABLE) ×1 IMPLANT
GOWN STRL REUS W/ TWL XL LVL3 (GOWN DISPOSABLE) ×2 IMPLANT
GOWN STRL REUS W/TWL 2XL LVL3 (GOWN DISPOSABLE) IMPLANT
GOWN STRL REUS W/TWL LRG LVL3 (GOWN DISPOSABLE) ×2
GOWN STRL REUS W/TWL XL LVL3 (GOWN DISPOSABLE) ×4
HALTER HD/CHIN CERV TRACTION D (MISCELLANEOUS) ×3 IMPLANT
HEMOSTAT POWDER KIT SURGIFOAM (HEMOSTASIS) ×3 IMPLANT
KIT BASIN OR (CUSTOM PROCEDURE TRAY) ×3 IMPLANT
KIT ROOM TURNOVER OR (KITS) ×3 IMPLANT
NEEDLE SPNL 20GX3.5 QUINCKE YW (NEEDLE) ×3 IMPLANT
NS IRRIG 1000ML POUR BTL (IV SOLUTION) ×3 IMPLANT
PACK LAMINECTOMY NEURO (CUSTOM PROCEDURE TRAY) ×3 IMPLANT
PAD ARMBOARD 7.5X6 YLW CONV (MISCELLANEOUS) ×9 IMPLANT
PEEK CAGE 7X14X11 (Cage) ×6 IMPLANT
PLATE 4 80XLCK NS SPNE CVD (Plate) ×1 IMPLANT
PLATE 4 ATLANTIS TRANS (Plate) ×2 IMPLANT
RUBBERBAND STERILE (MISCELLANEOUS) ×6 IMPLANT
SCREW ST FIX 4 ATL 3120213 (Screw) ×30 IMPLANT
SPACER SPNL 11X14X7XPEEK CVD (Cage) ×2 IMPLANT
SPCR SPNL 11X14X7XPEEK CVD (Cage) ×2 IMPLANT
SPONGE INTESTINAL PEANUT (DISPOSABLE) ×3 IMPLANT
SPONGE SURGIFOAM ABS GEL 100 (HEMOSTASIS) ×3 IMPLANT
STRIP CLOSURE SKIN 1/2X4 (GAUZE/BANDAGES/DRESSINGS) ×2 IMPLANT
SUT VIC AB 3-0 SH 8-18 (SUTURE) ×3 IMPLANT
SUT VIC AB 4-0 RB1 18 (SUTURE) ×6 IMPLANT
TAPE CLOTH 4X10 WHT NS (GAUZE/BANDAGES/DRESSINGS) ×3 IMPLANT
TAPE CLOTH SURG 4X10 WHT LF (GAUZE/BANDAGES/DRESSINGS) ×3 IMPLANT
TOWEL OR 17X24 6PK STRL BLUE (TOWEL DISPOSABLE) IMPLANT
TOWEL OR 17X26 10 PK STRL BLUE (TOWEL DISPOSABLE) ×3 IMPLANT
TRAP SPECIMEN MUCOUS 40CC (MISCELLANEOUS) ×3 IMPLANT
WATER STERILE IRR 1000ML POUR (IV SOLUTION) ×3 IMPLANT

## 2015-12-05 NOTE — Progress Notes (Signed)
Pt received at 12:53pm alert, with no noted distress. Pt stable, collar aligned and in place. Surgical dressing dry and intact.  Pt oriented to room. Safety measures in place. Pt repositioned for comfort.  Call bell within reach. Will continue to monitor.

## 2015-12-05 NOTE — Brief Op Note (Signed)
12/05/2015  11:25 AM  PATIENT:  William [name]  60 y.o. male  PRE-OPERATIVE DIAGNOSIS:  Stenosis  POST-OPERATIVE DIAGNOSIS:  Stenosis  PROCEDURE:  Procedure(s): Anterior Cervical Decompression Fusion Cervical three-four, Cervical four-five, Cervical five-six, Cervical six-seven (N/A)  SURGEON:  Surgeon(s) and Role:    * Julio Sicks, MD - Primary    * Donalee Citrin, MD - Assisting  PHYSICIAN ASSISTANT:   ASSISTANTS:    ANESTHESIA:   general  EBL:  Total I/O In: 1450 [I.V.:1450] Out: 150 [Blood:150]  BLOOD ADMINISTERED:none  DRAINS: none   LOCAL MEDICATIONS USED:  NONE  SPECIMEN:  No Specimen  DISPOSITION OF SPECIMEN:  N/A  COUNTS:  YES  TOURNIQUET:  * No tourniquets in log *  DICTATION: .Dragon Dictation  PLAN OF CARE: Admit to inpatient   PATIENT DISPOSITION:  PACU - hemodynamically stable.   Delay start of Pharmacological VTE agent (>24hrs) due to surgical blood loss or risk of bleeding: yes

## 2015-12-05 NOTE — Anesthesia Procedure Notes (Signed)
Procedure Name: Intubation Date/Time: 12/05/2015 8:39 AM Performed by: Jenne Campus Pre-anesthesia Checklist: Patient identified, Emergency Drugs available, Suction available, Patient being monitored and Timeout performed Patient Re-evaluated:Patient Re-evaluated prior to inductionOxygen Delivery Method: Circle system utilized Preoxygenation: Pre-oxygenation with 100% oxygen Intubation Type: IV induction Ventilation: Mask ventilation without difficulty and Oral airway inserted - appropriate to patient size Laryngoscope Size: Miller and 2 Grade View: Grade I Tube type: Oral Tube size: 7.5 mm Number of attempts: 1 Airway Equipment and Method: Stylet and LTA kit utilized Placement Confirmation: ETT inserted through vocal cords under direct vision,  positive ETCO2,  CO2 detector and breath sounds checked- equal and bilateral Secured at: 22 cm Tube secured with: Tape Dental Injury: Teeth and Oropharynx as per pre-operative assessment

## 2015-12-05 NOTE — Op Note (Signed)
Date of procedure: 12/05/2015  Date of dictation: Same  Service: Neurosurgery  Preoperative diagnosis: C3-4, C4-5, C5-6, C6-7 spondylosis with stenosis and myelopathy  Postoperative diagnosis: Same  Procedure Name: C3-4, C4-5, C5-6, C6-7 anterior cervical discectomy with interbody fusion utilizing interbody peek cage, locally harvested autograft, and anterior plate instrumentation  Surgeon:Westlyn Glaza A.Anthea Udovich, M.D.  Asst. Surgeon: Wynetta Emery  Anesthesia: General  Indication: 60 year old male with neck and bilateral upper extremity pain with progressive weakness and sensory loss. Workup demonstrates evidence of marked multilevel spondylosis with stenosis causing marked spinal cord compression with signal change from C3-C5. Patient presents now for 4 level anterior cervical decompression and fusion in hopes of improving his symptoms.  Operative note: After induction of anesthesia, patient positioned supine with neck slightly extended and held in place of halter traction. Anterior cervical region prepped and draped sterilely. Incision made overlying C5. Dissection then proceeded down to platysma. Platysma was then divided vertically and dissection proceeded along the medial border of the sternocleidomastoid muscle and carotid sheath. Trachea and esophagus are mobilized and retracted towards the left. Prevertebral fascia was stripped off the anterior spinal column. Longus cole muscles elevated bilaterally using large cautery. Deep self retaining retractors placed intraoperative fluoroscopy used and the levels were confirmed. Disc spaces at C3-4, C4-5, C5-6 and C6-7 were then incised with a 15 blade. Anterior osteophytes were removed. Discectomies then performed with various instruments down to the level of the posterior annulus. Microscope was brought into the field use at the remainder of the discectomy. Remaining aspects of annulus and osteophytes were removed down to the level of the posterior longitudinal  ligament was the high-speed drill. Posterior longitudinal ligament was elevated and resected in a piecemeal fashion. Underlying thecal sac was then identified. Wide central decompression was then performed by undercutting the bodies first at C3 and C4. Decompression then proceeded into each neural foramen. Wide anterior foraminotomies were performed on the course exiting nerve roots. Procedure then repeated at C4-5, C5-6 and C6-7 all without complication or problem. Wound is then irrigated MI solution. [employer] anatomic peek cages were packed with morselized autograft and all 4 cages were impacted into place and recessed slightly from the anterior cortical margin. 80 mm Atlantis translational plate was placed from the C3-C7 level. This is an attachment or fluoroscopic guidance using 13 monitor fixed angle screws to each of L5 levels. All 10 screws were given a final tightening and found to be solidly within the bone. Locking screws were engaged at all levels. Final images revealed good position of the bone grafts and hardware at the proper upper level with normal alignment of the spine. Wound was then irrigated with and bike solution. Hemostasis was found to be good. Wounds and closed in layers. Steri-Strips and a sterile dressing were applied. There were no apparent complications. Patient tolerated the procedure well and he returns to the recovery room postop.

## 2015-12-05 NOTE — Care Management Note (Signed)
Case Management Note  Patient Details  Name: William [name] MRN: [id number] Date of Birth: Apr 11, 1956  Subjective/Objective:                    Action/Plan: Patient was admitted for a C3-4, C4-5, C5-6, C6-7 anterior cervical discectomy with interbody fusion utilizing interbody peek cage, locally harvested autograft, and anterior plate instrumentation.  Will follow for discharge needs pending PT/OT evals and physician orders.   Expected Discharge Date:                  Expected Discharge Plan:     In-House Referral:     Discharge planning Services     Post Acute Care Choice:    Choice offered to:     DME Arranged:    DME Agency:     HH Arranged:    HH Agency:     Status of Service:  In process, will continue to follow  Medicare Important Message Given:    Date Medicare IM Given:    Medicare IM give by:    Date Additional Medicare IM Given:    Additional Medicare Important Message give by:     If discussed at Long Length of Stay Meetings, dates discussed:    Additional Comments:  Anda Kraft, RN 12/05/2015, 3:50 PM [phone]

## 2015-12-05 NOTE — Anesthesia Preprocedure Evaluation (Addendum)
Anesthesia Evaluation  Patient identified by MRN, date of birth, ID band Patient awake    Reviewed: Allergy & Precautions, NPO status , Patient's Chart, lab work & pertinent test results  Airway Mallampati: II  TM Distance: >3 FB Neck ROM: full    Dental  (+) Teeth Intact, Dental Advisory Given   Pulmonary Current Smoker,    breath sounds clear to auscultation       Cardiovascular hypertension, Pt. on medications + Peripheral Vascular Disease   Rhythm:regular Rate:Normal     Neuro/Psych  Headaches, PSYCHIATRIC DISORDERS Depression  Neuromuscular disease    GI/Hepatic GERD  Medicated and Controlled,(+) Hepatitis -, C  Endo/Other  diabetes, Poorly Controlled, Type 2, Insulin Dependentobese  Renal/GU      Musculoskeletal  (+) Arthritis ,   Abdominal   Peds  Hematology   Anesthesia Other Findings   Reproductive/Obstetrics                            Anesthesia Physical Anesthesia Plan  ASA: III  Anesthesia Plan: General   Post-op Pain Management:    Induction: Intravenous  Airway Management Planned: Oral ETT  Additional Equipment:   Intra-op Plan:   Post-operative Plan: Extubation in OR  Informed Consent: I have reviewed the patients History and Physical, chart, labs and discussed the procedure including the risks, benefits and alternatives for the proposed anesthesia with the patient or authorized representative who has indicated his/her understanding and acceptance.     Plan Discussed with: CRNA, Anesthesiologist and Surgeon  Anesthesia Plan Comments:         Anesthesia Quick Evaluation

## 2015-12-05 NOTE — Transfer of Care (Signed)
Immediate Anesthesia Transfer of Care Note  Patient: William [name]  Procedure(s) Performed: Procedure(s): Anterior Cervical Decompression Fusion Cervical three-four, Cervical four-five, Cervical five-six, Cervical six-seven (N/A)  Patient Location: PACU  Anesthesia Type:General  Level of Consciousness: drowsy  Airway & Oxygen Therapy: Patient Spontanous Breathing and Patient connected to nasal cannula oxygen  Post-op Assessment: Report given to RN and Post -op Vital signs reviewed and stable  Post vital signs: Reviewed and stable  Last Vitals:  Filed Vitals:   12/05/15 0739  BP: 142/96  Pulse: 93  Temp: 36.7 C  Resp: 16    Complications: No apparent anesthesia complications

## 2015-12-05 NOTE — H&P (Signed)
William [name] is an 60 y.o. male.   Chief Complaint: Weakness HPI: 60 year old male with bilateral upper and lower extremity numbness. Seizures and weakness which is progressively worsening. Workup demonstrates evidence of severe cervical spondylosis with stenosis causing marked spinal cord compression with signal abnormality worse at C3-4 and C4-5 but also present at C5-6 and C6-7. Patient has failed efforts at medical management and presents now for 4 level decompressive surgery.  Past Medical History  Diagnosis Date  . Hypertension   . Diabetes mellitus without complication (HCC)   . Chronic hepatitis C without hepatic coma (HCC)   . History of cocaine use   . Depression   . Arthritis   . History of TIA (transient ischemic attack)   . Hyperlipidemia   . History of migraine headaches   . Obesity, Class II, BMI 35-39.9   . BPH with obstruction/lower urinary tract symptoms   . Balanitis   . Urinary retention   . Tobacco abuse     Past Surgical History  Procedure Laterality Date  . Rotator  cuff surgery       left shoulder   . Spider bite      on neck    Family History  Problem Relation Age of Onset  . Hypertension Mother   . Diabetes Mother   . Heart disease Mother   . Thyroid disease Mother   . Hypertension Father   . Diabetes Father   . Heart disease Father    Social History:  reports that he has been smoking Cigarettes.  He has a 3 pack-year smoking history. He does not have any smokeless tobacco history on file. He reports that he uses illicit drugs. He reports that he does not drink alcohol.  Allergies:  Allergies  Allergen Reactions  . Other Other (See Comments)    Reports all narcotics make him sick. Pt premeds with Reglan to stop nausea  . Metformin And Related Other (See Comments)    Possible liver inflammation  . Prednisone     Disoriented   . Tamsulosin     Nose bleed    Medications Prior to Admission  Medication Sig Dispense Refill  . amLODipine  (NORVASC) 10 MG tablet Take 1 tablet (10 mg total) by mouth daily. 30 tablet 3  . aspirin-acetaminophen-caffeine (EXCEDRIN MIGRAINE) 250-250-65 MG tablet Take 1 tablet by mouth daily as needed for migraine.     . Aspirin-Caffeine (BAYER BACK & BODY) 500-32.5 MG TABS Take 1 tablet by mouth daily as needed (for pain).     . cyclobenzaprine (FLEXERIL) 10 MG tablet Take 1 tablet (10 mg total) by mouth 3 (three) times daily as needed for muscle spasms. 50 tablet 3  . diltiazem (CARDIZEM SR) 90 MG 12 hr capsule Take 1 capsule (90 mg total) by mouth every 12 (twelve) hours. 60 capsule 1  . diphenhydrAMINE (BENADRYL) 25 MG tablet Take 1 tablet (25 mg total) by mouth every 6 (six) hours. As needed with Compazine for headache. 20 tablet 0  . fluconazole (DIFLUCAN) 150 MG tablet TAKE 1 TABLET (150 MG TOTAL) BY MOUTH EVERY 7 (SEVEN) DAYS. (Patient taking differently: TAKE 1 TABLET (150 MG TOTAL) BY MOUTH EVERY 7 (SEVEN) DAYS on Monday) 4 tablet 0  . gabapentin (NEURONTIN) 100 MG capsule Take 1 capsule (100 mg total) by mouth 3 (three) times daily. 90 capsule 3  . glucose blood (ACCU-CHEK AVIVA) test strip Use as instructed to check blood glucose 4x/day 200 each 12  . insulin aspart (NOVOLOG  FLEXPEN) 100 UNIT/ML FlexPen Inject 15 Units into the skin 3 (three) times daily with meals. (Patient taking differently: Inject 25 Units into the skin 3 (three) times daily with meals. ) 8 pen 5  . Insulin Glargine (LANTUS SOLOSTAR) 100 UNIT/ML Solostar Pen Inject 30 Units into the skin 2 (two) times daily. (Patient taking differently: Inject 50 Units into the skin daily at 10 pm. ) 6 pen 2  . metoCLOPramide (REGLAN) 10 MG tablet Take 1 tablet (10 mg total) by mouth 3 (three) times daily as needed for nausea (headache / nausea). 20 tablet 0  . oxyCODONE-acetaminophen (PERCOCET/ROXICET) 5-325 MG tablet Take 1 tablet by mouth every 12 (twelve) hours as needed for severe pain. 60 tablet 0  . pantoprazole (PROTONIX) 40 MG tablet  Take 1 tablet (40 mg total) by mouth 2 (two) times daily. Twice a day for 10 days; then start using it once a day (Patient taking differently: Take 40 mg by mouth daily. ) 60 tablet 1  . prochlorperazine (COMPAZINE) 10 MG tablet Take 1 tablet (10 mg total) by mouth 2 (two) times daily as needed (Headache). Take with Benadryl 25 mg. 15 tablet 0  . sucralfate (CARAFATE) 1 GM/10ML suspension Take 10 mLs (1 g total) by mouth 4 (four) times daily -  with meals and at bedtime. 420 mL 1  . traZODone (DESYREL) 100 MG tablet Take 100 mg by mouth at bedtime as needed for sleep.     . BD PEN NEEDLE NANO U/F 32G X 4 MM MISC USE WITH FLEXPEN TO ADMINISTER INSULIN 100 each 5  . Elastic Bandages & Supports (THUMB BRACE) MISC 1 Device by Does not apply route daily as needed. 1 each 0  . Insulin Pen Needle (EASY TOUCH PEN NEEDLES) 32G X 6 MM MISC Use with Flexpen to administer Insulin 100 each 11  . Lancets (FREESTYLE) lancets Use as instructed to check blood glucose 4x/day 200 each 12  . metFORMIN (GLUCOPHAGE) 1000 MG tablet Take 1 tablet (1,000 mg total) by mouth 2 (two) times daily with a meal. (Patient not taking: Reported on 11/21/2015) 180 tablet 1  . naproxen (NAPROSYN) 375 MG tablet Take 1 tablet (375 mg total) by mouth 2 (two) times daily. (Patient not taking: Reported on 11/21/2015) 20 tablet 0    No results found for this or any previous visit (from the past 48 hour(s)). No results found.  Pertinent items noted in HPI and remainder of comprehensive ROS otherwise negative.  Blood pressure 142/96, pulse 93, temperature 98.1 F (36.7 C), temperature source Oral, resp. rate 16, weight 108.863 kg (240 lb), SpO2 99 %.  Patient is awake and alert. He is oriented and appropriate. His cranial nerve function is intact. His speech is fluent. His judgment and insight are intact. Examination his motor function reveals weakness of his grips and intrinsics bilaterally grating out of 4 minus over 5. He has weakness of  his triceps muscles grating out of 4 over 5 bilaterally. He has slight weakness of his wrist extensor muscles bilaterally. He has slight weakness of his deltoid muscle groups bilaterally. Lower extremity strength is mildly diminished with some increased tone. Sensory function reveals decreased sensation both upper and lower extremities and a patchy nondermatomal manner. Reflexes are increased. Hoffmann's responses are present in both hands. Examination head ears eyes and throat is unremarkable. Chest and abdomen are benign. Extremities are free from injury deformity. Assessment/Plan  Patient has multilevel cervical spondylosis with stenosis and myelopathy. Plan C3-4, C4-5, C5-6,  C6-7 ACDF with interbody fusion utilizing interbody peek cages, locally harvested autograft, and anterior plate instrumentation. Risks and benefits of been explained. Patient wishes to proceed.  Buzz Axel A 12/05/2015, 7:44 AM

## 2015-12-06 ENCOUNTER — Ambulatory Visit: Payer: Medicaid Other | Admitting: Family Medicine

## 2015-12-06 DIAGNOSIS — M5010 Cervical disc disorder with radiculopathy, unspecified cervical region: Secondary | ICD-10-CM

## 2015-12-06 DIAGNOSIS — M4802 Spinal stenosis, cervical region: Principal | ICD-10-CM

## 2015-12-06 LAB — GLUCOSE, CAPILLARY
Glucose-Capillary: 287 mg/dL — ABNORMAL HIGH (ref 65–99)
Glucose-Capillary: 349 mg/dL — ABNORMAL HIGH (ref 65–99)
Glucose-Capillary: 389 mg/dL — ABNORMAL HIGH (ref 65–99)
Glucose-Capillary: 349 mg/dL — ABNORMAL HIGH (ref 65–99)
Glucose-Capillary: 273 mg/dL — ABNORMAL HIGH (ref 65–99)

## 2015-12-06 NOTE — Evaluation (Signed)
Physical Therapy Evaluation and Discharge Patient Details Name: William [name] MRN: [id number] DOB: Dec 03, 1955 Today's Date: 12/06/2015   History of Present Illness  Pt is a 60 y/o M s/p 4 level cerical discectomy and fusion due to compressive cervical myelopathy.  After surgery pt w/ Bil C5 radiculopathy w/ complete absence of shoulder abduction and 2/5 strength of biceps Bil.  Pt's PMH includes Lt knee arthritis w/ reported need for TKA, hepatitis C, DM, cocaine use, TIA, Lt rotator cuff surgery, brain tumore (per pt).  Clinical Impression  Patient is s/p above surgery.  Bil UE weakness following surgery to be addressed w/ OT.  No skilled PT needs identified as pt is independent w/ all mobility.  Pt is signing off.    Follow Up Recommendations No PT follow up    Equipment Recommendations  None recommended by PT    Recommendations for Other Services OT consult     Precautions / Restrictions Precautions Precaution Comments: Reviwed cervical precautions w/ pt Required Braces or Orthoses: Cervical Brace Cervical Brace: Soft collar Restrictions Weight Bearing Restrictions: No      Mobility  Bed Mobility Overal bed mobility: Independent             General bed mobility comments: no cues or assist needed  Transfers Overall transfer level: Independent Equipment used: None             General transfer comment: No instability noted, no use of AD  Ambulation/Gait Ambulation/Gait assistance: Modified independent (Device/Increase time) Ambulation Distance (Feet): 250 Feet Assistive device: None Gait Pattern/deviations: Step-through pattern   Gait velocity interpretation: at or above normal speed for age/gender General Gait Details: Increased lateral weight shift to Lt w/ Lt trendelenberg due to Lt knee arthritis w/ knee varus.  Pt w/ no instability w/ high level balance activities  Stairs            Wheelchair Mobility    Modified Rankin (Stroke Patients Only)       Balance Overall balance assessment: Independent                           High level balance activites: Side stepping;Backward walking;Sudden stops High Level Balance Comments: No instability noted w/ high level balance activities Standardized Balance Assessment Standardized Balance Assessment : Dynamic Gait Index   Dynamic Gait Index Level Surface: Normal Gait and Pivot Turn: Normal Step Over Obstacle: Normal Step Around Obstacles: Normal       Pertinent Vitals/Pain Pain Assessment: No/denies pain    Home Living Family/patient expects to be discharged to:: Private residence Living Arrangements: Spouse/significant other Available Help at Discharge: Family;Available 24 hours/day Type of Home: Apartment Home Access: Level entry     Home Layout: One level Home Equipment: Crutches      Prior Function Level of Independence: Independent         Comments: Per pt report was on disability for Lt knee arthritis and previously because of brain tumor     Hand Dominance        Extremity/Trunk Assessment   Upper Extremity Assessment: Defer to OT evaluation           Lower Extremity Assessment: Overall WFL for tasks assessed (5/5 strength grossly throughout Bil LEs w/ MMT)      Cervical / Trunk Assessment: Other exceptions  Communication   Communication: Other (comment) (see general notes below (noted slurred speech))  Cognition Arousal/Alertness: Awake/alert Behavior During Therapy: WFL for tasks assessed/performed  Overall Cognitive Status: Within Functional Limits for tasks assessed                      General Comments General comments (skin integrity, edema, etc.): Noticed that it seemed as though pt was slurring his words some.  When asked if he noticed this he says that he tends to have slurred speech when his sugar gets low.  NT notified to check blood glucose and RN notified of slurred speech and pt's response.  Pt able to raise  his eyebrows, stick out his tongue, and smile all symmetrically.    Exercises        Assessment/Plan    PT Assessment Patent does not need any further PT services  PT Diagnosis Difficulty walking   PT Problem List    PT Treatment Interventions     PT Goals (Current goals can be found in the Care Plan section) Acute Rehab PT Goals Patient Stated Goal: to be able to use his hands PT Goal Formulation: All assessment and education complete, DC therapy    Frequency     Barriers to discharge        Co-evaluation               End of Session Equipment Utilized During Treatment: Gait belt;Cervical collar Activity Tolerance: Patient tolerated treatment well Patient left: in chair;with call bell/phone within reach;with family/visitor present Nurse Communication: Mobility status;Other (comment);Precautions (slurred speech, NT is checking blood glucose level)         Time: 1435-1500 PT Time Calculation (min) (ACUTE ONLY): 25 min   Charges:   PT Evaluation $PT Eval Low Complexity: 1 Procedure PT Treatments $Gait Training: 8-22 mins   PT G Codes:       Encarnacion Chu PT, DPT  Pager: [phone] Phone: [phone] 12/06/2015, 3:53 PM

## 2015-12-06 NOTE — Progress Notes (Signed)
Pt eating breakfast on side of bed, not able to ambulate pt at this time.

## 2015-12-06 NOTE — Progress Notes (Signed)
Postop day 1.  Patient awakened from anesthesia yesterday with bilateral complete, painless C5 radiculopathies with complete absence of shoulder abduction and 2/5 strength of biceps bilaterally. Remainder of his neurologic exam is normal with normal wrist extension, triceps function, hand intrinsic function and lower extremity function. Patient's wound is healing well. Placed on steroids overnight without much change in status area pain very well controlled. Swallowing reasonably well. Wound healing well. Next soft. Airway midline.  Status post 4 level anterior cervical discectomy and fusion for treatment of severe compressive cervical myelopathy. Patient had extremely severe bilateral C5 neural foraminal stenosis preop and I suspect in the course of decompressing these nerve roots the C5 nerve roots have suffered some transient injury leading to their dysfunction. I would like and his work with physical and occupational therapy. I will have the physical medicine and rehabilitation services evaluate him for possible inpatient rehabilitation as his bilateral proximal weakness is a significant functional concern prior to discharge home

## 2015-12-06 NOTE — Progress Notes (Signed)
Pt states pt sensitive to steroid. Pt will try steroid this time. Pt states steroid in the past elevate VS. HR 121, BP 140/100 when gave first dose of steroid.

## 2015-12-06 NOTE — Consult Note (Signed)
Physical Medicine and Rehabilitation Consult Reason for Consult: Cervical myelopathy Referring Physician: Dr.Pool   HPI: William [name] is a 60 y.o. right handed male with history of hypertension, diabetes mellitus, hepatitis C, tobacco abuse. Patient lives with wife in [place] independent prior to admission. Admitted 12/05/2015 with progressive weakness bilateral upper and lower extremities. X-rays and imaging revealed multiple level cervical spondylosis with stenosis and myelopathy. Underwent 4 level C 3-4, 4-5, 5-6, 6-7 anterior cervical discectomy and interbody fusion 12/05/2015 per Dr. Jordan Likes. Hospital course pain management. Decadron protocol as directed. Cervical collar applied. Physical and occupational therapy evaluations pending. M.D. has requested physical medicine rehabilitation consult.  Patient states that he just finished walking with PT. He was able to walk forward and sideways and backward without assistive device or physical assistance. He reports the PT told him he does not need any further physical therapy just OT. He lives with his wife who has had a hip replacement a couple years ago and still uses a rolling walker. He feels like she can help him with his dressing and bathing however  Review of Systems  Constitutional: Negative for fever and chills.  Respiratory: Negative for cough and shortness of breath.   Cardiovascular: Negative for chest pain and palpitations.  Gastrointestinal: Positive for constipation. Negative for nausea and vomiting.  Genitourinary: Positive for urgency.       Urinary retention  Musculoskeletal: Positive for myalgias, joint pain and neck pain.  Neurological: Positive for tingling, weakness and headaches. Negative for loss of consciousness.  Psychiatric/Behavioral: Positive for depression. The patient has insomnia.   All other systems reviewed and are negative.  Past Medical History  Diagnosis Date  . Hypertension   .  Diabetes mellitus without complication (HCC)   . Chronic hepatitis C without hepatic coma (HCC)   . History of cocaine use   . Depression   . Arthritis   . History of TIA (transient ischemic attack)   . Hyperlipidemia   . History of migraine headaches   . Obesity, Class II, BMI 35-39.9   . BPH with obstruction/lower urinary tract symptoms   . Balanitis   . Urinary retention   . Tobacco abuse    Past Surgical History  Procedure Laterality Date  . Rotator  cuff surgery       left shoulder   . Spider bite      on neck   Family History  Problem Relation Age of Onset  . Hypertension Mother   . Diabetes Mother   . Heart disease Mother   . Thyroid disease Mother   . Hypertension Father   . Diabetes Father   . Heart disease Father    Social History:  reports that he has been smoking Cigarettes.  He has a 3 pack-year smoking history. He does not have any smokeless tobacco history on file. He reports that he uses illicit drugs. He reports that he does not drink alcohol. Allergies:  Allergies  Allergen Reactions  . Other Other (See Comments)    Reports all narcotics make him sick. Pt premeds with Reglan to stop nausea  . Metformin And Related Other (See Comments)    Possible liver inflammation  . Prednisone     Disoriented   . Tamsulosin     Nose bleed   Medications Prior to Admission  Medication Sig Dispense Refill  . amLODipine (NORVASC) 10 MG tablet Take 1 tablet (10 mg total) by mouth daily. 30 tablet 3  .  aspirin-acetaminophen-caffeine (EXCEDRIN MIGRAINE) 250-250-65 MG tablet Take 1 tablet by mouth daily as needed for migraine.     . Aspirin-Caffeine (BAYER BACK & BODY) 500-32.5 MG TABS Take 1 tablet by mouth daily as needed (for pain).     . cyclobenzaprine (FLEXERIL) 10 MG tablet Take 1 tablet (10 mg total) by mouth 3 (three) times daily as needed for muscle spasms. 50 tablet 3  . diphenhydrAMINE (BENADRYL) 25 MG tablet Take 1 tablet (25 mg total) by mouth every 6 (six)  hours. As needed with Compazine for headache. 20 tablet 0  . fluconazole (DIFLUCAN) 150 MG tablet TAKE 1 TABLET (150 MG TOTAL) BY MOUTH EVERY 7 (SEVEN) DAYS. (Patient taking differently: TAKE 1 TABLET (150 MG TOTAL) BY MOUTH EVERY 7 (SEVEN) DAYS on Monday) 4 tablet 0  . gabapentin (NEURONTIN) 100 MG capsule Take 1 capsule (100 mg total) by mouth 3 (three) times daily. 90 capsule 3  . glucose blood (ACCU-CHEK AVIVA) test strip Use as instructed to check blood glucose 4x/day 200 each 12  . insulin aspart (NOVOLOG FLEXPEN) 100 UNIT/ML FlexPen Inject 15 Units into the skin 3 (three) times daily with meals. (Patient taking differently: Inject 25 Units into the skin 3 (three) times daily with meals. ) 8 pen 5  . Insulin Glargine (LANTUS SOLOSTAR) 100 UNIT/ML Solostar Pen Inject 30 Units into the skin 2 (two) times daily. (Patient taking differently: Inject 50 Units into the skin daily at 10 pm. ) 6 pen 2  . metoCLOPramide (REGLAN) 10 MG tablet Take 1 tablet (10 mg total) by mouth 3 (three) times daily as needed for nausea (headache / nausea). 20 tablet 0  . oxyCODONE-acetaminophen (PERCOCET/ROXICET) 5-325 MG tablet Take 1 tablet by mouth every 12 (twelve) hours as needed for severe pain. 60 tablet 0  . pantoprazole (PROTONIX) 40 MG tablet Take 1 tablet (40 mg total) by mouth 2 (two) times daily. Twice a day for 10 days; then start using it once a day (Patient taking differently: Take 40 mg by mouth daily. ) 60 tablet 1  . prochlorperazine (COMPAZINE) 10 MG tablet Take 1 tablet (10 mg total) by mouth 2 (two) times daily as needed (Headache). Take with Benadryl 25 mg. 15 tablet 0  . sucralfate (CARAFATE) 1 GM/10ML suspension Take 10 mLs (1 g total) by mouth 4 (four) times daily -  with meals and at bedtime. 420 mL 1  . traZODone (DESYREL) 100 MG tablet Take 100 mg by mouth at bedtime as needed for sleep.     . [DISCONTINUED] diltiazem (CARDIZEM SR) 90 MG 12 hr capsule Take 1 capsule (90 mg total) by mouth every  12 (twelve) hours. 60 capsule 1  . BD PEN NEEDLE NANO U/F 32G X 4 MM MISC USE WITH FLEXPEN TO ADMINISTER INSULIN 100 each 5  . Elastic Bandages & Supports (THUMB BRACE) MISC 1 Device by Does not apply route daily as needed. 1 each 0  . Insulin Pen Needle (EASY TOUCH PEN NEEDLES) 32G X 6 MM MISC Use with Flexpen to administer Insulin 100 each 11  . Lancets (FREESTYLE) lancets Use as instructed to check blood glucose 4x/day 200 each 12  . [DISCONTINUED] metFORMIN (GLUCOPHAGE) 1000 MG tablet Take 1 tablet (1,000 mg total) by mouth 2 (two) times daily with a meal. (Patient not taking: Reported on 11/21/2015) 180 tablet 1  . [DISCONTINUED] naproxen (NAPROSYN) 375 MG tablet Take 1 tablet (375 mg total) by mouth 2 (two) times daily. (Patient not taking: Reported on 11/21/2015)  20 tablet 0    Home:    Functional History:   Functional Status:  Mobility:          ADL:    Cognition: Cognition Orientation Level: Oriented X4    Blood pressure 126/94, pulse 125, temperature 98.4 F (36.9 C), temperature source Axillary, resp. rate 17, height 5\' 9"  (1.753 m), weight 108.727 kg (239 lb 11.2 oz), SpO2 95 %. Physical Exam  Constitutional: He appears well-developed.  HENT:  Head: Normocephalic.  Eyes: EOM are normal.  Neck:  Cervical collar in place  Cardiovascular: Normal rate and regular rhythm.   Respiratory: Effort normal and breath sounds normal. No respiratory distress.  GI: Soft. Bowel sounds are normal. He exhibits no distension.  Neurological:  Patient is a bit lethargic but arousable. Oriented 3 falls all commands. He does have some decreased fine motor skills.  Skin: Skin is warm and dry.  Motor strength is 0 bilateral deltoid, 0 bilateral biceps, 4+ bilateral triceps, 5 bilateral grip, 5 bilateral hip flexor, knee extensor, ankle dorsiflexor and plantar flexor Gait is without evidence of toe drag or knee instability no evidence of truncal or limb ataxia. He is able to walk without  an assistive device. No physical assist needed. He is able to do side to side stepping as well without loss of balance.  Results for orders placed or performed during the hospital encounter of 12/05/15 (from the past 24 hour(s))  Glucose, capillary     Status: Abnormal   Collection Time: 12/05/15 11:43 AM  Result Value Ref Range   Glucose-Capillary 183 (H) 65 - 99 mg/dL  Glucose, capillary     Status: Abnormal   Collection Time: 12/05/15  1:02 PM  Result Value Ref Range   Glucose-Capillary 281 (H) 65 - 99 mg/dL   Comment 1 Notify RN    Comment 2 Document in Chart   Glucose, capillary     Status: Abnormal   Collection Time: 12/05/15  4:34 PM  Result Value Ref Range   Glucose-Capillary 287 (H) 65 - 99 mg/dL   Comment 1 Notify RN    Comment 2 Document in Chart   Glucose, capillary     Status: Abnormal   Collection Time: 12/05/15  6:25 PM  Result Value Ref Range   Glucose-Capillary 294 (H) 65 - 99 mg/dL  Glucose, capillary     Status: Abnormal   Collection Time: 12/05/15 10:11 PM  Result Value Ref Range   Glucose-Capillary 232 (H) 65 - 99 mg/dL   Comment 1 Notify RN    Comment 2 Document in Chart   Glucose, capillary     Status: Abnormal   Collection Time: 12/06/15  6:40 AM  Result Value Ref Range   Glucose-Capillary 287 (H) 65 - 99 mg/dL   Comment 1 Notify RN    Comment 2 Document in Chart    Dg Cervical Spine 2-3 Views  12/05/2015  CLINICAL DATA:  Anterior fusion from C3-C7 EXAM: CERVICAL SPINE - 2-3 VIEW COMPARISON:  MR cervical spine of 10/19/2015 FINDINGS: On the C-arm spot films returned for interpretation, anterior fusion from C3-C7 is noted. Interbody fusion plugs appear to be in good position at C3-4, C4-5, C5-6, and C6-7 levels. No complicating features are noted. IMPRESSION: Anterior fusion from C3-C7 with normal alignment maintained. Electronically Signed   By: Dwyane Dee M.D.   On: 12/05/2015 12:02   Dg C-arm 1-60 Min  12/05/2015  CLINICAL DATA:  Anterior fusion from  C3-C7 EXAM: DG C-ARM 61-120  MIN COMPARISON:  None Fluoroscopy time of 4 seconds FINDINGS: C-arm fluoroscopy was provided during anterior fusion from C3-C7. IMPRESSION: C-arm fluoroscopy provided. Electronically Signed   By: Dwyane Dee M.D.   On: 12/05/2015 12:03    Assessment/Plan: Diagnosis: History of cervical myelopathy with improvements in balance and lower extremity strength, now with bilateral C5 radiculopathy 1. Does the need for close, 24 hr/day medical supervision in concert with the patient's rehab needs make it unreasonable for this patient to be served in a less intensive setting? No 2. Co-Morbidities requiring supervision/potential complications: Diabetes 3. Due to skin/wound care, medication administration, pain management and patient education, does the patient require 24 hr/day rehab nursing? No 4. Does the patient require coordinated care of a physician, rehab nurse, OT to address physical and functional deficits in the context of the above medical diagnosis(es)? No Addressing deficits in the following areas: bathing, dressing, feeding, grooming and toileting 5. Can the patient actively participate in an intensive therapy program of at least 3 hrs of therapy per day at least 5 days per week? Yes 6. The potential for patient to make measurable gains while on inpatient rehab is not applicable 7. Anticipated functional outcomes upon discharge from inpatient rehab are n/a  with PT, n/a with OT, n/a with SLP. 8. Estimated rehab length of stay to reach the above functional goals is: Not applicable 9. Does the patient have adequate social supports and living environment to accommodate these discharge functional goals? Yes 10. Anticipated D/C setting: Home 11. Anticipated post D/C treatments: HH therapy 12. Overall Rehab/Functional Prognosis: excellent  RECOMMENDATIONS: This patient's condition is appropriate for continued rehabilitative care in the following setting: [facility]  Therapy Patient has agreed to participate in recommended program. Yes Note that insurance prior authorization may be required for reimbursement for recommended care.  Comment: Patient only has occupational therapy needs, does not require any PT or speech therapy. Therefore does not qualify for inpatient rehabilitation    12/06/2015

## 2015-12-06 NOTE — Anesthesia Postprocedure Evaluation (Signed)
Anesthesia Post Note  Patient: [name]  Procedure(s) Performed: Procedure(s) (LRB): Anterior Cervical Decompression Fusion Cervical three-four, Cervical four-five, Cervical five-six, Cervical six-seven (N/A)  Patient location during evaluation: PACU Anesthesia Type: General Level of consciousness: awake and alert and patient cooperative Pain management: pain level controlled Vital Signs Assessment: post-procedure vital signs reviewed and stable Respiratory status: spontaneous breathing and respiratory function stable Cardiovascular status: stable Anesthetic complications: no    Last Vitals:  Filed Vitals:   12/06/15 0500 12/06/15 0515  BP: 143/118 140/100  Pulse: 129 121  Temp: 36.9 C   Resp: 18     Last Pain:  Filed Vitals:   12/06/15 0745  PainSc: Asleep                 Lisamarie Coke S

## 2015-12-07 ENCOUNTER — Encounter (HOSPITAL_COMMUNITY): Payer: Self-pay | Admitting: Neurosurgery

## 2015-12-07 LAB — GLUCOSE, CAPILLARY
Glucose-Capillary: 185 mg/dL — ABNORMAL HIGH (ref 65–99)
Glucose-Capillary: 275 mg/dL — ABNORMAL HIGH (ref 65–99)
Glucose-Capillary: 257 mg/dL — ABNORMAL HIGH (ref 65–99)

## 2015-12-07 MED ORDER — CYCLOBENZAPRINE HCL 10 MG PO TABS: 10.0000 mg | ORAL_TABLET | Freq: Three times a day (TID) | ORAL | Status: DC | PRN

## 2015-12-07 MED ORDER — GUAIFENESIN 100 MG/5ML PO SOLN
15.0000 mL | Freq: Four times a day (QID) | ORAL | Status: DC | PRN
  Administered 2015-12-07: 300 mg via ORAL
  Administered 2015-12-07: 200 mg via ORAL
  Administered 2015-12-07: 300 mg via ORAL
  Filled 2015-12-07: qty 20
  Filled 2015-12-07: qty 10

## 2015-12-07 MED ORDER — OXYCODONE-ACETAMINOPHEN 5-325 MG PO TABS: 1.0000 | ORAL_TABLET | Freq: Four times a day (QID) | ORAL | Status: DC | PRN

## 2015-12-07 NOTE — Discharge Instructions (Signed)

## 2015-12-07 NOTE — Discharge Summary (Signed)
Physician Discharge Summary  Patient ID: William [name] MRN: [id number] DOB/AGE: 1956-09-02 60 y.o.  Admit date: 12/05/2015 Discharge date: 12/07/2015  Admission Diagnoses:  Discharge Diagnoses:  Active Problems:   Cervical spinal stenosis   Discharged Condition: fair  Hospital Course: Patient admitted to the hospital where he underwent uncomplicated 4 level anterior cervical decompression and fusion. Postoperatively the patient has awakened with bilateral C5 nerve root dysfunction. He had some C5 nerve root dysfunction preop this is now worse. He has near complete loss of shoulder abduction and marked biceps weakness bilaterally. Remainder of his neurological exam is actually improved. He is swallowing well. He is getting around well. He is stated in therapy. He's ready for discharge home  Consults:   Significant Diagnostic Studies:   Treatments:   Discharge Exam: Blood pressure 149/95, pulse 120, temperature 98.8 F (37.1 C), temperature source Oral, resp. rate 20, height  (1.753 m), weight 108.727 kg (239 lb 11.2 oz), SpO2 94 %. Awake and alert. Oriented and appropriate. Cranial nerve function intact. Motor examination 5/5 except bilateral deltoids which are 1/5 and bilateral biceps which are 2/5 on the right and 3/5 on the left. Wound healing well. Chest and abdomen benign.  Disposition: 01-Home or Self Care     Medication List    TAKE these medications        amLODipine 10 MG tablet  Commonly known as:  NORVASC  Take 1 tablet (10 mg total) by mouth daily.     BAYER BACK & BODY 500-32.5 MG Tabs  Generic drug:  Aspirin-Caffeine  Take 1 tablet by mouth daily as needed (for pain).     cyclobenzaprine 10 MG tablet  Commonly known as:  FLEXERIL  Take 1 tablet (10 mg total) by mouth 3 (three) times daily as needed for muscle spasms.     diphenhydrAMINE 25 MG tablet  Commonly known as:  BENADRYL  Take 1 tablet (25 mg total) by mouth every 6 (six) hours. As needed with  Compazine for headache.     EXCEDRIN MIGRAINE 250-250-65 MG tablet  Generic drug:  aspirin-acetaminophen-caffeine  Take 1 tablet by mouth daily as needed for migraine.     fluconazole 150 MG tablet  Commonly known as:  DIFLUCAN  TAKE 1 TABLET (150 MG TOTAL) BY MOUTH EVERY 7 (SEVEN) DAYS.     freestyle lancets  Use as instructed to check blood glucose 4x/day     gabapentin 100 MG capsule  Commonly known as:  NEURONTIN  Take 1 capsule (100 mg total) by mouth 3 (three) times daily.     glucose blood test strip  Commonly known as:  ACCU-CHEK AVIVA  Use as instructed to check blood glucose 4x/day     insulin aspart 100 UNIT/ML FlexPen  Commonly known as:  NOVOLOG FLEXPEN  Inject 15 Units into the skin 3 (three) times daily with meals.     Insulin Glargine 100 UNIT/ML Solostar Pen  Commonly known as:  LANTUS SOLOSTAR  Inject 30 Units into the skin 2 (two) times daily.     Insulin Pen Needle 32G X 6 MM Misc  Commonly known as:  EASY TOUCH PEN NEEDLES  Use with Flexpen to administer Insulin     BD PEN NEEDLE NANO U/F 32G X 4 MM Misc  Generic drug:  Insulin Pen Needle  USE WITH FLEXPEN TO ADMINISTER INSULIN     metoCLOPramide 10 MG tablet  Commonly known as:  REGLAN  Take 1 tablet (10 mg total) by mouth 3 (  three) times daily as needed for nausea (headache / nausea).     oxyCODONE-acetaminophen 5-325 MG tablet  Commonly known as:  PERCOCET/ROXICET  Take 1-2 tablets by mouth every 6 (six) hours as needed for severe pain.     pantoprazole 40 MG tablet  Commonly known as:  PROTONIX  Take 1 tablet (40 mg total) by mouth 2 (two) times daily. Twice a day for 10 days; then start using it once a day     prochlorperazine 10 MG tablet  Commonly known as:  COMPAZINE  Take 1 tablet (10 mg total) by mouth 2 (two) times daily as needed (Headache). Take with Benadryl 25 mg.     sucralfate 1 GM/10ML suspension  Commonly known as:  CARAFATE  Take 10 mLs (1 g total) by mouth 4 (four) times  daily -  with meals and at bedtime.     THUMB BRACE Misc  1 Device by Does not apply route daily as needed.     traZODone 100 MG tablet  Commonly known as:  DESYREL  Take 100 mg by mouth at bedtime as needed for sleep.           Follow-up Information    Follow up with Temple PaciniPOOL,Latanya Hemmer A, MD.   Specialty:  Neurosurgery   Contact information:   [address] [place] [address] [phone]       Signed: Temple PaciniOOL,Cadon Raczka A 12/07/2015, 3:59 PM

## 2015-12-07 NOTE — Progress Notes (Signed)
Occupational Therapy Treatment Patient Details Name: William [name] MRN: [id number] DOB: 1956-05-06 Today's Date: 12/07/2015    History of present illness Pt is a 60 y/o M s/p 4 level cerical discectomy and fusion due to compressive cervical myelopathy.  After surgery pt w/ Bil C5 radiculopathy w/ complete absence of shoulder abduction and 2/5 strength of biceps Bil.  Pt's PMH includes Lt knee arthritis w/ reported need for TKA, hepatitis C, DM, cocaine use, TIA, Lt rotator cuff surgery, brain tumore (per pt).   OT comments  Pt presents with apparent deficits in the C5-6 distribution. Unable to lift BUE against gravity in shoulder flex/abd/ER, elbow flexspination. Established HEP with focus on gravity eliminated ex and gentle shoulder isometric strengthening ex. Educated pt on use of AE (given reacher, toilet aid, long handled sponge and built up tubing)and compensatory techniques to maximize functional level of independence with ADL. Pt would benefit from shower chair as pt is able to do more self care from seated position. Written handouts on information given. Pt safe to D/C home with 24/7 S. Pt will need to follow up with outpt OT as directed by Dr. Jordan Likes.   Follow Up Recommendations  Outpatient OT;Supervision/Assistance - 24 hour    Equipment Recommendations  Tub/shower seat    Recommendations for Other Services      Precautions / Restrictions Precautions Precautions: Cervical Required Braces or Orthoses: Cervical Brace Cervical Brace: Soft collar       Mobility Bed Mobility               General bed mobility comments: mod VC to follow log rolling technique  Transfers Overall transfer level: Independent                    Balance Overall balance assessment: No apparent balance deficits (not formally assessed)                                 ADL                                         General ADL Comments: Pt educated on  compensatory techniques for ADL. Dr. Jordan Likes gave OK for pt to lean forward to donn/doff shirts. Pt given long handled sponge to assist with UB bathing, toilet aid to help with hygiene after toileting and reacher to assist with ADL and environmental management. Also given built up tubing to assist with self feeding. Educated pt on importance of set up  and table height to increase ability to feed self.       Vision                     Perception     Praxis      Cognition   Behavior During Therapy: [facility] for tasks assessed/performed Overall Cognitive Status: Within Functional Limits for tasks assessed                       Extremity/Trunk Assessment               Exercises Other Exercises Other Exercises: table slides - shoulder FF; ER; Abduction in gravity elimiated plane; Elbow flex/ext; scapular protraction/retraction Other Exercises: isometric strengthening for FF and abd using door frame Other Exercises: supination/pronation   Shoulder Instructions  General Comments      Pertinent Vitals/ Pain       Pain Assessment: No/denies pain  Home Living                                          Prior Functioning/Environment              Frequency Min 2X/week     Progress Toward Goals  OT Goals(current goals can now be found in the care plan section)  Progress towards OT goals: Progressing toward goals  Acute Rehab OT Goals Patient Stated Goal: to be able to use his hands OT Goal Formulation: With patient Time For Goal Achievement: 12/21/15 Potential to Achieve Goals: Good ADL Goals Pt Will Perform Grooming: with min assist;standing Pt Will Perform Upper Body Bathing: with min assist;sitting Pt Will Perform Lower Body Bathing: with min assist;sit to/from stand Pt Will Perform Tub/Shower Transfer: Tub transfer;with modified independence;ambulating Pt/caregiver will Perform Home Exercise Program: Both right and left upper  extremity;With written HEP provided;With Supervision;Increased strength;Increased ROM Additional ADL Goal #1: Pt will independently demonstrate use of AE and compensatory techniques in order to assist with UB bathing/dressing  Plan Discharge plan remains appropriate    Co-evaluation                 End of Session Equipment Utilized During Treatment: Cervical collar   Activity Tolerance Patient tolerated treatment well   Patient Left in chair;with call bell/phone within reach   Nurse Communication Mobility status;Other (comment) (need for shower chair)        Time: [id number] OT Time Calculation (min): 43 min  Charges:    [name],HILLARY 12/07/2015, 5:03 PM   [facility] [name], OTR/L  [phone] 12/07/2015

## 2015-12-07 NOTE — Progress Notes (Signed)
Inpatient Rehabilitation  Met with patient to discuss IP Rehab consult recommendations. Given, that patient only has OT needs at this time he does not doesn't meet criteria for IP Rehab admission. Patient expressed concerns about his upper extremity abilities and recommended he discuss follow up options with Dr. Pool; notified RN CM. Will sign off that this time.   Melissa Bowie, M.A., CCC/SLP Admission Coordinator  [facility]  Cell [phone]    

## 2015-12-07 NOTE — Progress Notes (Signed)
Pt observed to be clearing his throat frequently with much phlegm, but denies difficulty in breathing, Dr Lovell SheehanJenkins (on call) paged and notified, ordered robitussin syrup, same given to pt at 0041, suction set up at bedside and pt encouraged to use the [place] to suction phlegm out, pt reassured, will however continue to monitor. Obasogie-Asidi, Preethi Scantlebury Efe

## 2015-12-07 NOTE — Evaluation (Addendum)
Occupational Therapy Evaluation Patient Details Name: William [name] MRN: [id number] DOB: 15-Oct-1955 Today's Date: 12/07/2015    History of Present Illness Pt is a 60 y/o M s/p 4 level cerical discectomy and fusion due to compressive cervical myelopathy.  After surgery pt w/ Bil C5 radiculopathy w/ complete absence of shoulder abduction and 2/5 strength of biceps Bil.  Pt's PMH includes Lt knee arthritis w/ reported need for TKA, hepatitis C, DM, cocaine use, TIA, Lt rotator cuff surgery, brain tumore (per pt).   Clinical Impression   Pt reports he was independent with all ADLs and mobility PTA. Currently pt is overall supervision for safety with functional mobility and mod-max assist for ADLs. Pt presenting with significant proximal bilateral UE weakness and decreased coordination impacting his independence with ADL activities. Pt reports that this onset of weakness is new for him post-op. He reports that he feels it is gradually getting better with one episode of spontaneous recovery in the middle of the night last night but when he woke up this morning he was back to being unable to move his arms. Pt planning to d/c home with 24/7 supervision from his wife. Recommending outpatient neuro OT for follow up in order to maximize independence and safety with ADLs. Pt would benefit from continued skilled OT to address established goals.     Follow Up Recommendations  Outpatient OT;Supervision/Assistance - 24 hour (neuro)    Equipment Recommendations  None recommended by OT    Recommendations for Other Services       Precautions / Restrictions Precautions Precautions: Cervical Precaution Comments: Reviewed cervical precautions with pt and provided handout. Required Braces or Orthoses: Cervical Brace Cervical Brace: Soft collar Restrictions Weight Bearing Restrictions: No      Mobility Bed Mobility               General bed mobility comments: Pt OOB in chair upon  arrival.  Transfers Overall transfer level: Independent Equipment used: None             General transfer comment: No instability noted, no use of AD    Balance Overall balance assessment: No apparent balance deficits (not formally assessed)                                          ADL Overall ADL's : Needs assistance/impaired Eating/Feeding: Maximal assistance;Sitting   Grooming: Maximal assistance;Sitting   Upper Body Bathing: Maximal assistance;Sitting   Lower Body Bathing: Moderate assistance;Sit to/from stand   Upper Body Dressing : Minimal assistance;Sitting   Lower Body Dressing: Moderate assistance   Toilet Transfer: Supervision/safety;Ambulation;Comfort height toilet   Toileting- Clothing Manipulation and Hygiene: Supervision/safety;Sit to/from stand       Functional mobility during ADLs: Supervision/safety General ADL Comments: No family present for OT eval. Pt reports bilateral UE weakness is new since surgery. Feels like it is slowly getting better with one episode of sponteneous recovery in the middle of the night last night. When he woke up this morning pt reports that he was unable to move his arms again.     Vision     Perception     Praxis      Pertinent Vitals/Pain Pain Assessment: No/denies pain     Hand Dominance Right   Extremity/Trunk Assessment Upper Extremity Assessment Upper Extremity Assessment: RUE deficits/detail;LUE deficits/detail RUE Deficits / Details: Pt able to shrug shoulder against gravity. Limited  elbow and shoulder flexion against gravity. Full activeextension, wrist and hand WFL. PROM overall WFL. Good grip strength. Strength 2-/5 shoulder and elbow flexion/ext, elbow flex/ext. No changes in sensation reported. Fine motor coordination WFL. RUE Coordination: decreased gross motor LUE Deficits / Details: Pt able to shrug shoulder against gravity. Limited elbow and shoulder flexion against gravity. Full  activeextension, wrist and hand WFL. PROM overall WFL. Good grip strength. Strength 2-/5 shoulder and elbow flexion/ext, elbow flex/ext. No changes in sensation reported. Fine motor coordination WFL. L UE overall slightly stronger than R. LUE Coordination: decreased gross motor   Lower Extremity Assessment Lower Extremity Assessment: Overall WFL for tasks assessed   Cervical / Trunk Assessment Cervical / Trunk Assessment: Other exceptions Cervical / Trunk Exceptions: s/p cervical discectomy and fusion   Communication Communication Communication: No difficulties   Cognition Arousal/Alertness: Awake/alert Behavior During Therapy: WFL for tasks assessed/performed Overall Cognitive Status: Within Functional Limits for tasks assessed                     General Comments       Exercises       Shoulder Instructions      Home Living Family/patient expects to be discharged to:: Private residence Living Arrangements: Spouse/significant other Available Help at Discharge: Family;Available 24 hours/day Type of Home: Apartment Home Access: Level entry     Home Layout: One level     Bathroom Shower/Tub: Tub/shower unit Shower/tub characteristics: Engineer, building services: Standard     Home Equipment: Crutches;Bedside commode          Prior Functioning/Environment Level of Independence: Independent        Comments: Per pt report was on disability for Lt knee arthritis and previously because of brain tumor    OT Diagnosis: Other (comment) (bilateral UE proximal weakness)   OT Problem List: Decreased strength;Decreased range of motion;Decreased coordination;Decreased knowledge of use of DME or AE;Decreased knowledge of precautions;Impaired UE functional use   OT Treatment/Interventions: Self-care/ADL training;Therapeutic exercise;Neuromuscular education;Energy conservation;DME and/or AE instruction;Therapeutic activities;Patient/family education    OT Goals(Current  goals can be found in the care plan section) Acute Rehab OT Goals Patient Stated Goal: to be able to use his hands OT Goal Formulation: With patient Time For Goal Achievement: 12/21/15 Potential to Achieve Goals: Good ADL Goals Pt Will Perform Grooming: with min assist;standing Pt Will Perform Upper Body Bathing: with min assist;sitting Pt Will Perform Lower Body Bathing: with min assist;sit to/from stand Pt Will Perform Tub/Shower Transfer: Tub transfer;with modified independence;ambulating  OT Frequency: Min 2X/week   Barriers to D/C:            Co-evaluation              End of Session Equipment Utilized During Treatment: Cervical collar  Activity Tolerance: Patient tolerated treatment well Patient left: in chair;with call bell/phone within reach   Time: 0915-0932 OT Time Calculation (min): 17 min Charges:  OT General Charges $OT Visit: 1 Procedure OT Evaluation $OT Eval Moderate Complexity: 1 Procedure G-Codes:     Gaye Alken M.S., OTR/L Pager: [phone]  12/07/2015, 10:39 AM

## 2015-12-07 NOTE — Progress Notes (Signed)
Orthopedic Tech Progress Note Patient Details:  William [name] 08-14-1956 [id number]  Ortho Devices Type of Ortho Device: Soft collar Ortho Device/Splint Interventions: Application   Saul FordyceJennifer C Kip Cropp 12/07/2015, 9:40 AM

## 2015-12-07 NOTE — Care Management Note (Signed)
Case Management Note  Patient Details  Name: William [name] MRN: [id number] Date of Birth: [birth year]  Subjective/Objective:                    Action/Plan: Patient will be discharging home.  CM spoke with Dr Jordan LikesPool, no HH or Outpt therapies are needed at this time.  CM notified [facility] of need for shower stool with back prior to discharge home this evening.  Bedside RN updated.  Expected Discharge Date:                  Expected Discharge Plan:  Home/Self Care  In-House Referral:     Discharge planning Services  CM Consult  Post Acute Care Choice:  Durable Medical Equipment Choice offered to:     DME Arranged:  Shower stool DME Agency:  [facility]  [facility]     Status of Service:  Completed, signed off  Medicare Important Message Given:    Date Medicare IM Given:    Medicare IM give by:    Date Additional Medicare IM Given:    Additional Medicare Important Message give by:     If discussed at Long Length of Stay Meetings, dates discussed:    Additional CommentsAnda [name]:  William Belmares C, RN 12/07/2015, 4:07 PM [phone]

## 2015-12-09 ENCOUNTER — Emergency Department (HOSPITAL_COMMUNITY): Payer: Medicaid Other

## 2015-12-09 ENCOUNTER — Encounter (HOSPITAL_COMMUNITY): Payer: Self-pay | Admitting: *Deleted

## 2015-12-09 ENCOUNTER — Emergency Department (HOSPITAL_COMMUNITY)
Admission: EM | Admit: 2015-12-09 | Discharge: 2015-12-09 | Disposition: A | Discharge status: Home / Self Care | Payer: Medicaid Other | Attending: Emergency Medicine | Admitting: Emergency Medicine

## 2015-12-09 DIAGNOSIS — E119 Type 2 diabetes mellitus without complications: Secondary | ICD-10-CM | POA: Insufficient documentation

## 2015-12-09 DIAGNOSIS — Z87438 Personal history of other diseases of male genital organs: Secondary | ICD-10-CM | POA: Diagnosis not present

## 2015-12-09 DIAGNOSIS — F17210 Nicotine dependence, cigarettes, uncomplicated: Secondary | ICD-10-CM | POA: Insufficient documentation

## 2015-12-09 DIAGNOSIS — G8918 Other acute postprocedural pain: Secondary | ICD-10-CM | POA: Diagnosis present

## 2015-12-09 DIAGNOSIS — I10 Essential (primary) hypertension: Secondary | ICD-10-CM | POA: Diagnosis not present

## 2015-12-09 DIAGNOSIS — Z79899 Other long term (current) drug therapy: Secondary | ICD-10-CM | POA: Insufficient documentation

## 2015-12-09 DIAGNOSIS — F329 Major depressive disorder, single episode, unspecified: Secondary | ICD-10-CM | POA: Diagnosis not present

## 2015-12-09 DIAGNOSIS — Z794 Long term (current) use of insulin: Secondary | ICD-10-CM | POA: Insufficient documentation

## 2015-12-09 DIAGNOSIS — M96830 Postprocedural hemorrhage and hematoma of a musculoskeletal structure following a musculoskeletal system procedure: Secondary | ICD-10-CM | POA: Diagnosis not present

## 2015-12-09 DIAGNOSIS — Z8619 Personal history of other infectious and parasitic diseases: Secondary | ICD-10-CM | POA: Insufficient documentation

## 2015-12-09 DIAGNOSIS — Z8673 Personal history of transient ischemic attack (TIA), and cerebral infarction without residual deficits: Secondary | ICD-10-CM | POA: Insufficient documentation

## 2015-12-09 DIAGNOSIS — R000 Tachycardia, unspecified: Secondary | ICD-10-CM | POA: Diagnosis not present

## 2015-12-09 DIAGNOSIS — E669 Obesity, unspecified: Secondary | ICD-10-CM | POA: Diagnosis not present

## 2015-12-09 DIAGNOSIS — M1990 Unspecified osteoarthritis, unspecified site: Secondary | ICD-10-CM | POA: Insufficient documentation

## 2015-12-09 DIAGNOSIS — G43909 Migraine, unspecified, not intractable, without status migrainosus: Secondary | ICD-10-CM | POA: Insufficient documentation

## 2015-12-09 DIAGNOSIS — S1093XA Contusion of unspecified part of neck, initial encounter: Secondary | ICD-10-CM

## 2015-12-09 DIAGNOSIS — M542 Cervicalgia: Secondary | ICD-10-CM

## 2015-12-09 LAB — PROTIME-INR
INR: 1.12 (ref 0.00–1.49)
Prothrombin Time: 14.6 seconds (ref 11.6–15.2)

## 2015-12-09 LAB — CBG MONITORING, ED: Glucose-Capillary: 310 mg/dL — ABNORMAL HIGH (ref 65–99)

## 2015-12-09 LAB — CBC
HCT: 43.8 % (ref 39.0–52.0)
Hemoglobin: 15.2 g/dL (ref 13.0–17.0)
MCH: 30.6 pg (ref 26.0–34.0)
MCHC: 34.7 g/dL (ref 30.0–36.0)
MCV: 88.1 fL (ref 78.0–100.0)
Platelets: 223 10*3/uL (ref 150–400)
RBC: 4.97 MIL/uL (ref 4.22–5.81)
RDW: 14.3 % (ref 11.5–15.5)
WBC: 13 10*3/uL — ABNORMAL HIGH (ref 4.0–10.5)

## 2015-12-09 LAB — BASIC METABOLIC PANEL
Anion gap: 13 (ref 5–15)
BUN: 22 mg/dL — ABNORMAL HIGH (ref 6–20)
CO2: 25 mmol/L (ref 22–32)
Calcium: 9.2 mg/dL (ref 8.9–10.3)
Chloride: 98 mmol/L — ABNORMAL LOW (ref 101–111)
Creatinine, Ser: 1.13 mg/dL (ref 0.61–1.24)
GFR calc Af Amer: >60 mL/min (ref >60)
GFR calc non Af Amer: >60 mL/min (ref >60)
Glucose, Bld: 493 mg/dL — ABNORMAL HIGH (ref 65–99)
Potassium: 4.7 mmol/L (ref 3.5–5.1)
Sodium: 136 mmol/L (ref 135–145)

## 2015-12-09 MED ORDER — SODIUM CHLORIDE 0.9 % IV BOLUS (SEPSIS)
1000.0000 mL | Freq: Once | INTRAVENOUS | Status: AC
  Administered 2015-12-09: 1000 mL via INTRAVENOUS

## 2015-12-09 MED ORDER — INSULIN ASPART 100 UNIT/ML ~~LOC~~ SOLN
10.0000 [IU] | Freq: Once | SUBCUTANEOUS | Status: AC
  Administered 2015-12-09: 10 [IU] via INTRAVENOUS
  Filled 2015-12-09: qty 1

## 2015-12-09 MED ORDER — IOHEXOL 350 MG/ML SOLN
100.0000 mL | Freq: Once | INTRAVENOUS | Status: AC | PRN
  Administered 2015-12-09: 100 mL via INTRAVENOUS

## 2015-12-09 NOTE — ED Notes (Signed)
CHECKED CBG 310, RN DAVID INFORMED

## 2015-12-09 NOTE — ED Notes (Signed)
Pt reports having spinal surgery on wed, dc from hospital on Friday. Pt having bruising to chest which he just noticed today. Only other complaint is having a cough since surgery.

## 2015-12-09 NOTE — ED Provider Notes (Signed)
CSN: [id number]     Arrival date & time 12/09/15  1515 History   First MD Initiated Contact with Patient 12/09/15 1757     Chief Complaint  Patient presents with  . Post-op Problem     (Consider location/radiation/quality/duration/timing/severity/associated sxs/prior Treatment) HPI Comments: Patient is a 60 year old male with recent cervical spine surgery by Dr. Dutch Quint using an anterior approach. This was performed 4 days ago. He presents today with complaints of swelling in his neck and bruising that extends down into his chest. He also reports a cough and feeling somewhat short of breath. He denies fevers or chills. He denies any swelling in his legs and denies any calf pain. He vomited this evening and this was concerning to the wife who strongly encouraged him to come here to be evaluated. He denies to me he is having any difficulty swallowing.  The history is provided by the patient.    Past Medical History  Diagnosis Date  . Hypertension   . Diabetes mellitus without complication (HCC)   . Chronic hepatitis C without hepatic coma (HCC)   . History of cocaine use   . Depression   . Arthritis   . History of TIA (transient ischemic attack)   . Hyperlipidemia   . History of migraine headaches   . Obesity, Class II, BMI 35-39.9   . BPH with obstruction/lower urinary tract symptoms   . Balanitis   . Urinary retention   . Tobacco abuse    Past Surgical History  Procedure Laterality Date  . Rotator  cuff surgery       left shoulder   . Spider bite      on neck  . Anterior cervical decomp/discectomy fusion N/A 12/05/2015    Procedure: Anterior Cervical Decompression Fusion Cervical three-four, Cervical four-five, Cervical five-six, Cervical six-seven;  Surgeon: Julio Sicks, MD;  Location: [facility];  Service: Neurosurgery;  Laterality: N/A;   Family History  Problem Relation Age of Onset  . Hypertension Mother   . Diabetes Mother   . Heart disease Mother   . Thyroid disease  Mother   . Hypertension Father   . Diabetes Father   . Heart disease Father    Social History  Substance Use Topics  . Smoking status: Current Every Day Smoker -- 0.10 packs/day for 30 years    Types: Cigarettes    Last Attempt to Quit: 09/27/2014  . Smokeless tobacco: None  . Alcohol Use: No    Review of Systems  All other systems reviewed and are negative.     Allergies  Other; Metformin and related; Prednisone; and Tamsulosin  Home Medications   Prior to Admission medications   Medication Sig Start Date End Date Taking? Authorizing Provider  amLODipine (NORVASC) 10 MG tablet Take 1 tablet (10 mg total) by mouth daily. 11/06/15   Edwena Felty, MD  aspirin-acetaminophen-caffeine (EXCEDRIN MIGRAINE) [phone] MG tablet Take 1 tablet by mouth daily as needed for migraine.     Historical Provider, MD  Aspirin-Caffeine (BAYER BACK & BODY) 500-32.5 MG TABS Take 1 tablet by mouth daily as needed (for pain).     Historical Provider, MD  BD PEN NEEDLE NANO U/F 32G X 4 MM MISC USE WITH FLEXPEN TO ADMINISTER INSULIN 08/14/15   Edwena Felty, MD  cyclobenzaprine (FLEXERIL) 10 MG tablet Take 1 tablet (10 mg total) by mouth 3 (three) times daily as needed for muscle spasms. 12/07/15   Julio Sicks, MD  diphenhydrAMINE (BENADRYL) 25 MG tablet Take 1  tablet (25 mg total) by mouth every 6 (six) hours. As needed with Compazine for headache. 10/27/14   Arby Barrette, MD  Elastic Bandages & Supports (THUMB BRACE) MISC 1 Device by Does not apply route daily as needed. 07/10/15   Edwena Felty, MD  fluconazole (DIFLUCAN) 150 MG tablet TAKE 1 TABLET (150 MG TOTAL) BY MOUTH EVERY 7 (SEVEN) DAYS. Patient taking differently: TAKE 1 TABLET (150 MG TOTAL) BY MOUTH EVERY 7 (SEVEN) DAYS on Monday 11/09/15   Edwena Felty, MD  gabapentin (NEURONTIN) 100 MG capsule Take 1 capsule (100 mg total) by mouth 3 (three) times daily. 05/30/15   Edwena Felty, MD  glucose blood (ACCU-CHEK AVIVA) test strip Use as  instructed to check blood glucose 4x/day 05/30/15   Edwena Felty, MD  insulin aspart (NOVOLOG FLEXPEN) 100 UNIT/ML FlexPen Inject 15 Units into the skin 3 (three) times daily with meals. Patient taking differently: Inject 25 Units into the skin 3 (three) times daily with meals.  08/11/15   Vassie Loll, MD  Insulin Glargine (LANTUS SOLOSTAR) 100 UNIT/ML Solostar Pen Inject 30 Units into the skin 2 (two) times daily. Patient taking differently: Inject 50 Units into the skin daily at 10 pm.  08/11/15   Vassie Loll, MD  Insulin Pen Needle (EASY TOUCH PEN NEEDLES) 32G X 6 MM MISC Use with Flexpen to administer Insulin 08/04/14   Radhika P Phadke, MD  Lancets (FREESTYLE) lancets Use as instructed to check blood glucose 4x/day 05/30/15   Edwena Felty, MD  metoCLOPramide (REGLAN) 10 MG tablet Take 1 tablet (10 mg total) by mouth 3 (three) times daily as needed for nausea (headache / nausea). 12/22/14   Garlon Hatchet, PA-C  oxyCODONE-acetaminophen (PERCOCET/ROXICET) 5-325 MG tablet Take 1-2 tablets by mouth every 6 (six) hours as needed for severe pain. 12/07/15   Julio Sicks, MD  pantoprazole (PROTONIX) 40 MG tablet Take 1 tablet (40 mg total) by mouth 2 (two) times daily. Twice a day for 10 days; then start using it once a day Patient taking differently: Take 40 mg by mouth daily.  08/11/15   Vassie Loll, MD  prochlorperazine (COMPAZINE) 10 MG tablet Take 1 tablet (10 mg total) by mouth 2 (two) times daily as needed (Headache). Take with Benadryl 25 mg. 10/27/14   Arby Barrette, MD  sucralfate (CARAFATE) 1 GM/10ML suspension Take 10 mLs (1 g total) by mouth 4 (four) times daily -  with meals and at bedtime. 09/25/15   Edwena Felty, MD  traZODone (DESYREL) 100 MG tablet Take 100 mg by mouth at bedtime as needed for sleep.  09/06/14   Historical Provider, MD   BP 155/108 mmHg  Pulse 125  Temp(Src) 99.1 F (37.3 C) (Oral)  Resp 18  SpO2 94% Physical Exam  Constitutional: He is oriented to person,  place, and time. He appears well-developed and well-nourished. No distress.  HENT:  Head: Normocephalic and atraumatic.  Neck: Normal range of motion. Neck supple.  There is an incision present on the anterior neck. Steri-Strips are in place. There is some swelling and ecchymosis surrounding the incision, however there is no breakdown or drainage. There is bruising that extends to the upper chest.  Cardiovascular: Regular rhythm and normal heart sounds.   No murmur heard. Patient is tachycardic.  Pulmonary/Chest: Effort normal and breath sounds normal. No respiratory distress. He has no wheezes.  Abdominal: Soft. Bowel sounds are normal. He exhibits no distension. There is no tenderness.  Musculoskeletal: Normal range of motion. He exhibits no  edema.  There is no edema of the lower extremities. There is no calf tenderness and Homans sign is absent bilaterally.  Neurological: He is alert and oriented to person, place, and time.  Skin: Skin is warm and dry. He is not diaphoretic.  Nursing note and vitals reviewed.   ED Course  Procedures (including critical care time) Labs Review Labs Reviewed  BASIC METABOLIC PANEL - Abnormal; Notable for the following:    Chloride 98 (*)    Glucose, Bld 493 (*)    BUN 22 (*)    All other components within normal limits  CBC - Abnormal; Notable for the following:    WBC 13.0 (*)    All other components within normal limits  PROTIME-INR    Imaging Review Dg Chest 2 View  12/09/2015  CLINICAL DATA:  Cough.  Postop. EXAM: CHEST  2 VIEW COMPARISON:  08/09/2015 FINDINGS: Lateral view degraded by patient arm position. Cervical spine fixation. Moderate thoracic spondylosis. Midline trachea. No pleural effusion or pneumothorax. Mild left base atelectasis. No lobar consolidation. Calcified granuloma in the right mid lung. IMPRESSION: No acute cardiopulmonary disease. Electronically Signed   By: Jeronimo GreavesKyle  Talbot M.D.   On: 12/09/2015 16:33   I have personally  reviewed and evaluated these images and lab results as part of my medical decision-making.   EKG Interpretation None      MDM   Final diagnoses:  None    Patient presents here with complaints of swelling to the chest and neck after undergoing neck surgery with an anterior approach. He is concerned because there is bruising from the neck extending into the chest. CT scan reveals a hematoma in the area of the incision, however nothing out of proportion with what is expected at this point postoperatively. He also has a fluid collection in the retro-pharyngeal area that is also consistent with what is to be expected at this point postoperatively. I've spoken with Dr. Conchita ParisNundkumar from neurosurgery who is comfortable with these findings and recommends the patient follow-up as scheduled with Dr. Dutch QuintPoole, his neurosurgeon.    Geoffery Lyonsouglas Makalynn Berwanger, MD 12/09/15 2229

## 2015-12-09 NOTE — ED Notes (Signed)
Pt ambulated with quick steady gait from the room to the restroom.

## 2015-12-09 NOTE — Discharge Instructions (Signed)
°  Follow-up as scheduled with Dr. Dutch QuintPoole in the neurosurgery clinic.  Return to the emergency department if you develop difficulty breathing or swallowing, increased swelling, or other new and concerning symptoms.

## 2015-12-14 NOTE — Brief Op Note (Signed)
12/05/2015  1:39 PM  PATIENT:  William [name]  60 y.o. male  PRE-OPERATIVE DIAGNOSIS:  Stenosis  POST-OPERATIVE DIAGNOSIS:  Stenosis  PROCEDURE:  Procedure(s): Anterior Cervical Decompression Fusion Cervical three-four, Cervical four-five, Cervical five-six, Cervical six-seven (N/A)  SURGEON:  Surgeon(s) and Role:    * Julio SicksHenry Venita Seng, MD - Primary    * Donalee CitrinGary Cram, MD - Assisting  PHYSICIAN ASSISTANT:   ASSISTANTS: none   ANESTHESIA:   general  EBL:     BLOOD ADMINISTERED:none  DRAINS: none   LOCAL MEDICATIONS USED:  NONE  SPECIMEN:  No Specimen  DISPOSITION OF SPECIMEN:  N/A  COUNTS:  YES  TOURNIQUET:  * No tourniquets in log *  DICTATION: .Dragon Dictation  PLAN OF CARE: Admit to inpatient   PATIENT DISPOSITION:  PACU - hemodynamically stable.   Delay start of Pharmacological VTE agent (>24hrs) due to surgical blood loss or risk of bleeding: yes

## 2015-12-17 ENCOUNTER — Encounter (HOSPITAL_COMMUNITY): Payer: Self-pay | Admitting: Emergency Medicine

## 2015-12-17 ENCOUNTER — Emergency Department (HOSPITAL_COMMUNITY)
Admission: EM | Admit: 2015-12-17 | Discharge: 2015-12-17 | Disposition: A | Discharge status: Home / Self Care | Payer: Medicaid Other | Attending: Emergency Medicine | Admitting: Emergency Medicine

## 2015-12-17 DIAGNOSIS — E119 Type 2 diabetes mellitus without complications: Secondary | ICD-10-CM | POA: Diagnosis not present

## 2015-12-17 DIAGNOSIS — I10 Essential (primary) hypertension: Secondary | ICD-10-CM | POA: Insufficient documentation

## 2015-12-17 DIAGNOSIS — R0989 Other specified symptoms and signs involving the circulatory and respiratory systems: Secondary | ICD-10-CM | POA: Insufficient documentation

## 2015-12-17 DIAGNOSIS — Z4801 Encounter for change or removal of surgical wound dressing: Secondary | ICD-10-CM | POA: Diagnosis not present

## 2015-12-17 DIAGNOSIS — R221 Localized swelling, mass and lump, neck: Secondary | ICD-10-CM | POA: Diagnosis not present

## 2015-12-17 DIAGNOSIS — F17210 Nicotine dependence, cigarettes, uncomplicated: Secondary | ICD-10-CM | POA: Insufficient documentation

## 2015-12-17 DIAGNOSIS — R1110 Vomiting, unspecified: Secondary | ICD-10-CM | POA: Insufficient documentation

## 2015-12-17 LAB — COMPREHENSIVE METABOLIC PANEL
ALBUMIN: 3.1 g/dL — AB (ref 3.5–5.0)
ALK PHOS: 68 U/L (ref 38–126)
ALT: 430 U/L — ABNORMAL HIGH (ref 17–63)
ANION GAP: 7 (ref 5–15)
AST: 325 U/L — ABNORMAL HIGH (ref 15–41)
BUN: 7 mg/dL (ref 6–20)
CALCIUM: 9 mg/dL (ref 8.9–10.3)
CO2: 28 mmol/L (ref 22–32)
Chloride: 102 mmol/L (ref 101–111)
Creatinine, Ser: 0.69 mg/dL (ref 0.61–1.24)
GFR calc non Af Amer: >60 mL/min (ref >60)
Glucose, Bld: 150 mg/dL — ABNORMAL HIGH (ref 65–99)
POTASSIUM: 3.8 mmol/L (ref 3.5–5.1)
SODIUM: 137 mmol/L (ref 135–145)
Total Bilirubin: 0.7 mg/dL (ref 0.3–1.2)
Total Protein: 6.8 g/dL (ref 6.5–8.1)

## 2015-12-17 LAB — CBC
HEMATOCRIT: 43.7 % (ref 39.0–52.0)
HEMOGLOBIN: 14.5 g/dL (ref 13.0–17.0)
MCH: 29 pg (ref 26.0–34.0)
MCHC: 33.2 g/dL (ref 30.0–36.0)
MCV: 87.4 fL (ref 78.0–100.0)
Platelets: 147 10*3/uL — ABNORMAL LOW (ref 150–400)
RBC: 5 MIL/uL (ref 4.22–5.81)
RDW: 14.1 % (ref 11.5–15.5)
WBC: 8.3 10*3/uL (ref 4.0–10.5)

## 2015-12-17 LAB — LIPASE, BLOOD: LIPASE: 16 U/L (ref 11–51)

## 2015-12-17 NOTE — ED Notes (Addendum)
Pt reports since having spine surgery last week has been having choking sensation, swallowing difficulties and emesis after eating. No airway swelling noted. Resp even, unlabored, CTA. VSS.Pt reports swelling at neck incision since Friday. Denies recent fever.

## 2015-12-17 NOTE — ED Notes (Signed)
Unable to locate when called for room x3 

## 2015-12-20 ENCOUNTER — Inpatient Hospital Stay (HOSPITAL_COMMUNITY): Payer: Medicaid Other

## 2015-12-20 ENCOUNTER — Emergency Department (HOSPITAL_COMMUNITY): Payer: Medicaid Other | Admitting: Certified Registered Nurse Anesthetist

## 2015-12-20 ENCOUNTER — Encounter (HOSPITAL_COMMUNITY)
Admission: EM | Disposition: A | Discharge status: Rehabilitation Facility | Payer: Self-pay | Source: Home / Self Care | Attending: Neurosurgery

## 2015-12-20 ENCOUNTER — Inpatient Hospital Stay (HOSPITAL_COMMUNITY)
Admission: EM | Admit: 2015-12-20 | Discharge: 2015-12-27 | DRG: 856 | Disposition: A | Discharge status: Rehabilitation Facility | Payer: Medicaid Other | Attending: Neurosurgery | Admitting: Neurosurgery

## 2015-12-20 ENCOUNTER — Encounter (HOSPITAL_COMMUNITY): Payer: Self-pay | Admitting: Emergency Medicine

## 2015-12-20 ENCOUNTER — Emergency Department (HOSPITAL_COMMUNITY): Payer: Medicaid Other

## 2015-12-20 DIAGNOSIS — Z888 Allergy status to other drugs, medicaments and biological substances status: Secondary | ICD-10-CM

## 2015-12-20 DIAGNOSIS — G9341 Metabolic encephalopathy: Secondary | ICD-10-CM | POA: Diagnosis not present

## 2015-12-20 DIAGNOSIS — M868X8 Other osteomyelitis, other site: Secondary | ICD-10-CM | POA: Diagnosis not present

## 2015-12-20 DIAGNOSIS — IMO0002 Reserved for concepts with insufficient information to code with codable children: Secondary | ICD-10-CM | POA: Insufficient documentation

## 2015-12-20 DIAGNOSIS — Z4659 Encounter for fitting and adjustment of other gastrointestinal appliance and device: Secondary | ICD-10-CM | POA: Diagnosis not present

## 2015-12-20 DIAGNOSIS — T8859XA Other complications of anesthesia, initial encounter: Secondary | ICD-10-CM | POA: Diagnosis not present

## 2015-12-20 DIAGNOSIS — E785 Hyperlipidemia, unspecified: Secondary | ICD-10-CM | POA: Diagnosis present

## 2015-12-20 DIAGNOSIS — J9691 Respiratory failure, unspecified with hypoxia: Secondary | ICD-10-CM | POA: Diagnosis not present

## 2015-12-20 DIAGNOSIS — E669 Obesity, unspecified: Secondary | ICD-10-CM | POA: Diagnosis present

## 2015-12-20 DIAGNOSIS — Z789 Other specified health status: Secondary | ICD-10-CM | POA: Diagnosis not present

## 2015-12-20 DIAGNOSIS — Z981 Arthrodesis status: Secondary | ICD-10-CM

## 2015-12-20 DIAGNOSIS — B182 Chronic viral hepatitis C: Secondary | ICD-10-CM | POA: Diagnosis present

## 2015-12-20 DIAGNOSIS — T814XXA Infection following a procedure, initial encounter: Principal | ICD-10-CM | POA: Diagnosis present

## 2015-12-20 DIAGNOSIS — T814XXD Infection following a procedure, subsequent encounter: Secondary | ICD-10-CM

## 2015-12-20 DIAGNOSIS — E876 Hypokalemia: Secondary | ICD-10-CM | POA: Diagnosis present

## 2015-12-20 DIAGNOSIS — Z8249 Family history of ischemic heart disease and other diseases of the circulatory system: Secondary | ICD-10-CM

## 2015-12-20 DIAGNOSIS — G959 Disease of spinal cord, unspecified: Secondary | ICD-10-CM | POA: Diagnosis not present

## 2015-12-20 DIAGNOSIS — IMO0001 Reserved for inherently not codable concepts without codable children: Secondary | ICD-10-CM

## 2015-12-20 DIAGNOSIS — K219 Gastro-esophageal reflux disease without esophagitis: Secondary | ICD-10-CM | POA: Diagnosis present

## 2015-12-20 DIAGNOSIS — N401 Enlarged prostate with lower urinary tract symptoms: Secondary | ICD-10-CM | POA: Diagnosis present

## 2015-12-20 DIAGNOSIS — G062 Extradural and subdural abscess, unspecified: Secondary | ICD-10-CM | POA: Diagnosis present

## 2015-12-20 DIAGNOSIS — L0211 Cutaneous abscess of neck: Secondary | ICD-10-CM

## 2015-12-20 DIAGNOSIS — G061 Intraspinal abscess and granuloma: Secondary | ICD-10-CM | POA: Diagnosis not present

## 2015-12-20 DIAGNOSIS — E1165 Type 2 diabetes mellitus with hyperglycemia: Secondary | ICD-10-CM | POA: Diagnosis not present

## 2015-12-20 DIAGNOSIS — K5900 Constipation, unspecified: Secondary | ICD-10-CM | POA: Diagnosis present

## 2015-12-20 DIAGNOSIS — N138 Other obstructive and reflux uropathy: Secondary | ICD-10-CM | POA: Diagnosis present

## 2015-12-20 DIAGNOSIS — E118 Type 2 diabetes mellitus with unspecified complications: Secondary | ICD-10-CM

## 2015-12-20 DIAGNOSIS — Z8673 Personal history of transient ischemic attack (TIA), and cerebral infarction without residual deficits: Secondary | ICD-10-CM

## 2015-12-20 DIAGNOSIS — R112 Nausea with vomiting, unspecified: Secondary | ICD-10-CM

## 2015-12-20 DIAGNOSIS — S14129S Central cord syndrome at unspecified level of cervical spinal cord, sequela: Secondary | ICD-10-CM | POA: Diagnosis not present

## 2015-12-20 DIAGNOSIS — Y838 Other surgical procedures as the cause of abnormal reaction of the patient, or of later complication, without mention of misadventure at the time of the procedure: Secondary | ICD-10-CM | POA: Diagnosis present

## 2015-12-20 DIAGNOSIS — R000 Tachycardia, unspecified: Secondary | ICD-10-CM | POA: Insufficient documentation

## 2015-12-20 DIAGNOSIS — I10 Essential (primary) hypertension: Secondary | ICD-10-CM | POA: Diagnosis present

## 2015-12-20 DIAGNOSIS — E440 Moderate protein-calorie malnutrition: Secondary | ICD-10-CM | POA: Diagnosis not present

## 2015-12-20 DIAGNOSIS — R1314 Dysphagia, pharyngoesophageal phase: Secondary | ICD-10-CM | POA: Diagnosis not present

## 2015-12-20 DIAGNOSIS — M4622 Osteomyelitis of vertebra, cervical region: Secondary | ICD-10-CM | POA: Diagnosis present

## 2015-12-20 DIAGNOSIS — Z9911 Dependence on respirator [ventilator] status: Secondary | ICD-10-CM

## 2015-12-20 DIAGNOSIS — F17210 Nicotine dependence, cigarettes, uncomplicated: Secondary | ICD-10-CM | POA: Diagnosis present

## 2015-12-20 DIAGNOSIS — R0682 Tachypnea, not elsewhere classified: Secondary | ICD-10-CM | POA: Insufficient documentation

## 2015-12-20 DIAGNOSIS — Z79899 Other long term (current) drug therapy: Secondary | ICD-10-CM

## 2015-12-20 DIAGNOSIS — J9601 Acute respiratory failure with hypoxia: Secondary | ICD-10-CM | POA: Diagnosis not present

## 2015-12-20 DIAGNOSIS — Z794 Long term (current) use of insulin: Secondary | ICD-10-CM

## 2015-12-20 DIAGNOSIS — F329 Major depressive disorder, single episode, unspecified: Secondary | ICD-10-CM | POA: Diagnosis present

## 2015-12-20 DIAGNOSIS — Z9889 Other specified postprocedural states: Secondary | ICD-10-CM | POA: Insufficient documentation

## 2015-12-20 DIAGNOSIS — R1310 Dysphagia, unspecified: Secondary | ICD-10-CM | POA: Diagnosis present

## 2015-12-20 DIAGNOSIS — M4712 Other spondylosis with myelopathy, cervical region: Secondary | ICD-10-CM | POA: Diagnosis not present

## 2015-12-20 DIAGNOSIS — R1110 Vomiting, unspecified: Secondary | ICD-10-CM

## 2015-12-20 DIAGNOSIS — Z978 Presence of other specified devices: Secondary | ICD-10-CM | POA: Insufficient documentation

## 2015-12-20 DIAGNOSIS — B9689 Other specified bacterial agents as the cause of diseases classified elsewhere: Secondary | ICD-10-CM | POA: Diagnosis not present

## 2015-12-20 DIAGNOSIS — M5412 Radiculopathy, cervical region: Secondary | ICD-10-CM | POA: Diagnosis not present

## 2015-12-20 DIAGNOSIS — J9690 Respiratory failure, unspecified, unspecified whether with hypoxia or hypercapnia: Secondary | ICD-10-CM

## 2015-12-20 DIAGNOSIS — T8149XA Infection following a procedure, other surgical site, initial encounter: Secondary | ICD-10-CM | POA: Insufficient documentation

## 2015-12-20 DIAGNOSIS — Z6835 Body mass index (BMI) 35.0-35.9, adult: Secondary | ICD-10-CM

## 2015-12-20 HISTORY — PX: ANTERIOR CERVICAL DECOMPRESSION FOR EPIDURAL ABSCESS: SHX5719

## 2015-12-20 LAB — I-STAT CHEM 8, ED
BUN: 8 mg/dL (ref 6–20)
CALCIUM ION: 1.1 mmol/L — AB (ref 1.13–1.30)
CREATININE: 0.6 mg/dL — AB (ref 0.61–1.24)
Chloride: 95 mmol/L — ABNORMAL LOW (ref 101–111)
Glucose, Bld: 182 mg/dL — ABNORMAL HIGH (ref 65–99)
HEMATOCRIT: 45 % (ref 39.0–52.0)
HEMOGLOBIN: 15.3 g/dL (ref 13.0–17.0)
Potassium: 3.6 mmol/L (ref 3.5–5.1)
SODIUM: 137 mmol/L (ref 135–145)
TCO2: 29 mmol/L (ref 0–100)

## 2015-12-20 LAB — POCT I-STAT 3, ART BLOOD GAS (G3+)
ACID-BASE EXCESS: 1 mmol/L (ref 0.0–2.0)
Bicarbonate: 28.4 mEq/L — ABNORMAL HIGH (ref 20.0–24.0)
O2 SAT: 98 %
PH ART: 7.332 — AB (ref 7.350–7.450)
PO2 ART: 111 mmHg — AB (ref 80.0–100.0)
TCO2: 30 mmol/L (ref 0–100)
pCO2 arterial: 53.5 mmHg — ABNORMAL HIGH (ref 35.0–45.0)

## 2015-12-20 LAB — CBC WITH DIFFERENTIAL/PLATELET
BASOS PCT: 0 %
Basophils Absolute: 0 10*3/uL (ref 0.0–0.1)
EOS ABS: 0.2 10*3/uL (ref 0.0–0.7)
Eosinophils Relative: 1 %
HCT: 40.9 % (ref 39.0–52.0)
Hemoglobin: 14 g/dL (ref 13.0–17.0)
LYMPHS ABS: 2.6 10*3/uL (ref 0.7–4.0)
Lymphocytes Relative: 22 %
MCH: 29.9 pg (ref 26.0–34.0)
MCHC: 34.2 g/dL (ref 30.0–36.0)
MCV: 87.4 fL (ref 78.0–100.0)
MONO ABS: 1.3 10*3/uL — AB (ref 0.1–1.0)
MONOS PCT: 11 %
Neutro Abs: 7.6 10*3/uL (ref 1.7–7.7)
Neutrophils Relative %: 65 %
Platelets: 167 10*3/uL (ref 150–400)
RBC: 4.68 MIL/uL (ref 4.22–5.81)
RDW: 14.3 % (ref 11.5–15.5)
WBC: 11.7 10*3/uL — ABNORMAL HIGH (ref 4.0–10.5)

## 2015-12-20 LAB — RAPID URINE DRUG SCREEN, HOSP PERFORMED
Amphetamines: NOT DETECTED
Barbiturates: NOT DETECTED
Benzodiazepines: POSITIVE — AB
COCAINE: NOT DETECTED
OPIATES: POSITIVE — AB
Tetrahydrocannabinol: NOT DETECTED

## 2015-12-20 LAB — LACTIC ACID, PLASMA
LACTIC ACID, VENOUS: 1.4 mmol/L (ref 0.5–2.0)
Lactic Acid, Venous: 1.5 mmol/L (ref 0.5–2.0)

## 2015-12-20 LAB — GLUCOSE, CAPILLARY
GLUCOSE-CAPILLARY: 159 mg/dL — AB (ref 65–99)
GLUCOSE-CAPILLARY: 178 mg/dL — AB (ref 65–99)
Glucose-Capillary: 172 mg/dL — ABNORMAL HIGH (ref 65–99)

## 2015-12-20 LAB — MRSA PCR SCREENING: MRSA by PCR: NEGATIVE

## 2015-12-20 LAB — TRIGLYCERIDES: Triglycerides: 146 mg/dL (ref <150)

## 2015-12-20 SURGERY — ANTERIOR CERVICAL DECOMPRESSION FOR EPIDURAL ABSCESS
Anesthesia: General | Site: Neck

## 2015-12-20 MED ORDER — MIDAZOLAM HCL 2 MG/2ML IJ SOLN
INTRAMUSCULAR | Status: AC
  Filled 2015-12-20: qty 2

## 2015-12-20 MED ORDER — PROPOFOL 500 MG/50ML IV EMUL
INTRAVENOUS | Status: DC | PRN
  Administered 2015-12-20: 75 ug/kg/min via INTRAVENOUS

## 2015-12-20 MED ORDER — THROMBIN 5000 UNITS EX SOLR
CUTANEOUS | Status: DC | PRN
  Administered 2015-12-20 (×2): 5000 [IU] via TOPICAL

## 2015-12-20 MED ORDER — PROPOFOL 1000 MG/100ML IV EMUL
0.0000 ug/kg/min | INTRAVENOUS | Status: DC
  Administered 2015-12-20: 35 ug/kg/min via INTRAVENOUS
  Administered 2015-12-20: 60 ug/kg/min via INTRAVENOUS
  Administered 2015-12-20: 30 ug/kg/min via INTRAVENOUS
  Administered 2015-12-21: 25 ug/kg/min via INTRAVENOUS
  Administered 2015-12-21 (×3): 35 ug/kg/min via INTRAVENOUS
  Administered 2015-12-21: 25 ug/kg/min via INTRAVENOUS
  Administered 2015-12-22: 40 ug/kg/min via INTRAVENOUS
  Administered 2015-12-22: 31.64 ug/kg/min via INTRAVENOUS
  Administered 2015-12-23: 40 ug/kg/min via INTRAVENOUS
  Administered 2015-12-23: 30 ug/kg/min via INTRAVENOUS
  Filled 2015-12-20 (×13): qty 100

## 2015-12-20 MED ORDER — KETAMINE HCL 100 MG/ML IJ SOLN
INTRAMUSCULAR | Status: AC
  Administered 2015-12-20 (×5): 10 mg via INTRAVENOUS
  Filled 2015-12-20: qty 1

## 2015-12-20 MED ORDER — LIDOCAINE HCL (CARDIAC) 20 MG/ML IV SOLN
INTRAVENOUS | Status: AC
  Filled 2015-12-20: qty 5

## 2015-12-20 MED ORDER — THROMBIN 5000 UNITS EX SOLR
OROMUCOSAL | Status: DC | PRN
  Administered 2015-12-20: 10 mL via TOPICAL

## 2015-12-20 MED ORDER — PANTOPRAZOLE SODIUM 40 MG IV SOLR
40.0000 mg | Freq: Every day | INTRAVENOUS | Status: DC
  Administered 2015-12-20 – 2015-12-24 (×5): 40 mg via INTRAVENOUS
  Filled 2015-12-20 (×9): qty 40

## 2015-12-20 MED ORDER — SODIUM CHLORIDE 0.9 % IV SOLN
25.0000 ug/h | INTRAVENOUS | Status: DC
  Administered 2015-12-20: 50 ug/h via INTRAVENOUS
  Administered 2015-12-21 (×2): 150 ug/h via INTRAVENOUS
  Administered 2015-12-21: 200 ug/h via INTRAVENOUS
  Administered 2015-12-22: 100 ug/h via INTRAVENOUS
  Administered 2015-12-23: 200 ug/h via INTRAVENOUS
  Filled 2015-12-20 (×7): qty 50

## 2015-12-20 MED ORDER — ANTISEPTIC ORAL RINSE SOLUTION (CORINZ)
7.0000 mL | OROMUCOSAL | Status: DC
  Administered 2015-12-20 – 2015-12-23 (×27): 7 mL via OROMUCOSAL

## 2015-12-20 MED ORDER — VITAL HIGH PROTEIN PO LIQD: 1000.0000 mL | ORAL | Status: DC

## 2015-12-20 MED ORDER — HYDROMORPHONE HCL 1 MG/ML IJ SOLN
1.0000 mg | Freq: Once | INTRAMUSCULAR | Status: AC
  Administered 2015-12-20: 1 mg via INTRAVENOUS
  Filled 2015-12-20: qty 1

## 2015-12-20 MED ORDER — LACTATED RINGERS IV SOLN
INTRAVENOUS | Status: DC | PRN
  Administered 2015-12-20 (×2): via INTRAVENOUS

## 2015-12-20 MED ORDER — PROPOFOL 10 MG/ML IV BOLUS
INTRAVENOUS | Status: AC
  Filled 2015-12-20: qty 20

## 2015-12-20 MED ORDER — HEMOSTATIC AGENTS (NO CHARGE) OPTIME
TOPICAL | Status: DC | PRN
  Administered 2015-12-20: 1 via TOPICAL

## 2015-12-20 MED ORDER — PRO-STAT SUGAR FREE PO LIQD
60.0000 mL | Freq: Every day | ORAL | Status: DC
  Administered 2015-12-20 – 2015-12-23 (×11): 60 mL
  Filled 2015-12-20 (×17): qty 60

## 2015-12-20 MED ORDER — 0.9 % SODIUM CHLORIDE (POUR BTL) OPTIME
TOPICAL | Status: DC | PRN
  Administered 2015-12-20: 1000 mL

## 2015-12-20 MED ORDER — ONDANSETRON HCL 4 MG/2ML IJ SOLN
4.0000 mg | Freq: Once | INTRAMUSCULAR | Status: AC
  Administered 2015-12-20: 4 mg via INTRAVENOUS
  Filled 2015-12-20: qty 2

## 2015-12-20 MED ORDER — CHLORHEXIDINE GLUCONATE 0.12% ORAL RINSE (MEDLINE KIT)
15.0000 mL | Freq: Two times a day (BID) | OROMUCOSAL | Status: DC
  Administered 2015-12-20 – 2015-12-23 (×7): 15 mL via OROMUCOSAL

## 2015-12-20 MED ORDER — INSULIN GLARGINE 100 UNIT/ML ~~LOC~~ SOLN
10.0000 [IU] | Freq: Every day | SUBCUTANEOUS | Status: DC
  Administered 2015-12-20: 10 [IU] via SUBCUTANEOUS
  Filled 2015-12-20 (×2): qty 0.1

## 2015-12-20 MED ORDER — VANCOMYCIN HCL 10 G IV SOLR
2000.0000 mg | Freq: Once | INTRAVENOUS | Status: AC
Start: 2015-12-20 — End: 2015-12-20
  Administered 2015-12-20: 2000 mg via INTRAVENOUS
  Filled 2015-12-20: qty 2000

## 2015-12-20 MED ORDER — INSULIN ASPART 100 UNIT/ML ~~LOC~~ SOLN: 0.0000 [IU] | Freq: Every day | SUBCUTANEOUS | Status: DC

## 2015-12-20 MED ORDER — FENTANYL CITRATE (PF) 100 MCG/2ML IJ SOLN
INTRAMUSCULAR | Status: DC | PRN
  Administered 2015-12-20: 50 ug via INTRAVENOUS

## 2015-12-20 MED ORDER — PHENYLEPHRINE HCL 10 MG/ML IJ SOLN
INTRAMUSCULAR | Status: DC | PRN
  Administered 2015-12-20 (×4): 80 ug via INTRAVENOUS

## 2015-12-20 MED ORDER — PIPERACILLIN-TAZOBACTAM 3.375 G IVPB
3.3750 g | Freq: Three times a day (TID) | INTRAVENOUS | Status: DC
  Administered 2015-12-20 – 2015-12-24 (×12): 3.375 g via INTRAVENOUS
  Filled 2015-12-20 (×14): qty 50

## 2015-12-20 MED ORDER — INSULIN ASPART 100 UNIT/ML ~~LOC~~ SOLN
0.0000 [IU] | SUBCUTANEOUS | Status: DC
  Administered 2015-12-20: 5 [IU] via SUBCUTANEOUS
  Administered 2015-12-20 (×2): 3 [IU] via SUBCUTANEOUS
  Administered 2015-12-21: 4 [IU] via SUBCUTANEOUS
  Administered 2015-12-21 (×2): 5 [IU] via SUBCUTANEOUS
  Administered 2015-12-21: 3 [IU] via SUBCUTANEOUS
  Administered 2015-12-21 (×2): 5 [IU] via SUBCUTANEOUS
  Administered 2015-12-22: 3 [IU] via SUBCUTANEOUS
  Administered 2015-12-22: 5 [IU] via SUBCUTANEOUS
  Administered 2015-12-22: 3 [IU] via SUBCUTANEOUS
  Administered 2015-12-22: 5 [IU] via SUBCUTANEOUS
  Administered 2015-12-22 – 2015-12-23 (×3): 3 [IU] via SUBCUTANEOUS
  Administered 2015-12-23 (×3): 2 [IU] via SUBCUTANEOUS
  Administered 2015-12-24: 8 [IU] via SUBCUTANEOUS
  Administered 2015-12-24 (×2): 3 [IU] via SUBCUTANEOUS
  Administered 2015-12-24: 8 [IU] via SUBCUTANEOUS
  Administered 2015-12-25: 5 [IU] via SUBCUTANEOUS
  Administered 2015-12-25: 2 [IU] via SUBCUTANEOUS
  Administered 2015-12-25 (×4): 5 [IU] via SUBCUTANEOUS
  Administered 2015-12-26 (×4): 2 [IU] via SUBCUTANEOUS
  Administered 2015-12-26 – 2015-12-27 (×3): 3 [IU] via SUBCUTANEOUS
  Administered 2015-12-27 (×2): 2 [IU] via SUBCUTANEOUS

## 2015-12-20 MED ORDER — INSULIN ASPART 100 UNIT/ML ~~LOC~~ SOLN: 0.0000 [IU] | Freq: Three times a day (TID) | SUBCUTANEOUS | Status: DC

## 2015-12-20 MED ORDER — PHENYLEPHRINE HCL 10 MG/ML IJ SOLN
10.0000 mg | INTRAVENOUS | Status: DC | PRN
  Administered 2015-12-20: 50 ug/min via INTRAVENOUS

## 2015-12-20 MED ORDER — ROCURONIUM BROMIDE 100 MG/10ML IV SOLN
INTRAVENOUS | Status: DC | PRN
  Administered 2015-12-20 (×2): 50 mg via INTRAVENOUS

## 2015-12-20 MED ORDER — MENTHOL 3 MG MT LOZG: 1.0000 | LOZENGE | OROMUCOSAL | Status: DC | PRN

## 2015-12-20 MED ORDER — VITAL HIGH PROTEIN PO LIQD
1000.0000 mL | ORAL | Status: DC
  Administered 2015-12-20: 1000 mL
  Administered 2015-12-21: 06:00:00

## 2015-12-20 MED ORDER — MIDAZOLAM HCL 5 MG/5ML IJ SOLN
INTRAMUSCULAR | Status: DC | PRN
  Administered 2015-12-20 (×2): 2 mg via INTRAVENOUS

## 2015-12-20 MED ORDER — SODIUM CHLORIDE 0.9% FLUSH
3.0000 mL | Freq: Two times a day (BID) | INTRAVENOUS | Status: DC
  Administered 2015-12-21: 3 mL via INTRAVENOUS

## 2015-12-20 MED ORDER — DEXAMETHASONE SODIUM PHOSPHATE 4 MG/ML IJ SOLN
INTRAMUSCULAR | Status: AC
  Filled 2015-12-20: qty 2

## 2015-12-20 MED ORDER — FENTANYL CITRATE (PF) 100 MCG/2ML IJ SOLN: 100.0000 ug | INTRAMUSCULAR | Status: DC | PRN

## 2015-12-20 MED ORDER — ROCURONIUM BROMIDE 50 MG/5ML IV SOLN
INTRAVENOUS | Status: AC
  Filled 2015-12-20: qty 1

## 2015-12-20 MED ORDER — FENTANYL CITRATE (PF) 250 MCG/5ML IJ SOLN
INTRAMUSCULAR | Status: AC
  Filled 2015-12-20: qty 5

## 2015-12-20 MED ORDER — ACETAMINOPHEN 650 MG RE SUPP: 650.0000 mg | RECTAL | Status: DC | PRN

## 2015-12-20 MED ORDER — ONDANSETRON HCL 4 MG/2ML IJ SOLN
INTRAMUSCULAR | Status: AC
  Filled 2015-12-20: qty 2

## 2015-12-20 MED ORDER — PROPOFOL 10 MG/ML IV BOLUS
INTRAVENOUS | Status: DC | PRN
  Administered 2015-12-20: 150 mg via INTRAVENOUS

## 2015-12-20 MED ORDER — LIDOCAINE HCL (CARDIAC) 20 MG/ML IV SOLN
INTRAVENOUS | Status: DC | PRN
  Administered 2015-12-20: 50 mg via INTRAVENOUS

## 2015-12-20 MED ORDER — VANCOMYCIN HCL IN DEXTROSE 1-5 GM/200ML-% IV SOLN
1000.0000 mg | Freq: Two times a day (BID) | INTRAVENOUS | Status: DC
Start: 2015-12-20 — End: 2015-12-24
  Administered 2015-12-20 – 2015-12-24 (×8): 1000 mg via INTRAVENOUS
  Filled 2015-12-20 (×9): qty 200

## 2015-12-20 MED ORDER — IOHEXOL 300 MG/ML  SOLN
100.0000 mL | Freq: Once | INTRAMUSCULAR | Status: AC | PRN
  Administered 2015-12-20: 100 mL via INTRAVENOUS

## 2015-12-20 MED ORDER — LABETALOL HCL 5 MG/ML IV SOLN
INTRAVENOUS | Status: DC | PRN
  Administered 2015-12-20: 5 mg via INTRAVENOUS

## 2015-12-20 MED ORDER — PHENOL 1.4 % MT LIQD: 1.0000 | OROMUCOSAL | Status: DC | PRN

## 2015-12-20 MED ORDER — SODIUM CHLORIDE 0.9 % IV SOLN
INTRAVENOUS | Status: DC
  Administered 2015-12-20: 100 mL/h via INTRAVENOUS
  Administered 2015-12-21 – 2015-12-23 (×5): via INTRAVENOUS

## 2015-12-20 MED ORDER — SODIUM CHLORIDE 0.9 % IV SOLN
1.0000 g | Freq: Once | INTRAVENOUS | Status: AC
  Administered 2015-12-20: 1 g via INTRAVENOUS
  Filled 2015-12-20: qty 10

## 2015-12-20 MED ORDER — ACETAMINOPHEN 325 MG PO TABS: 650.0000 mg | ORAL_TABLET | ORAL | Status: DC | PRN

## 2015-12-20 SURGICAL SUPPLY — 44 items
BENZOIN TINCTURE PRP APPL 2/3 (GAUZE/BANDAGES/DRESSINGS) ×3 IMPLANT
BLADE ULTRA TIP 2M (BLADE) ×3 IMPLANT
BNDG GAUZE ELAST 4 BULKY (GAUZE/BANDAGES/DRESSINGS) IMPLANT
BUR BARREL STRAIGHT FLUTE 4.0 (BURR) IMPLANT
BUR MATCHSTICK NEURO 3.0 LAGG (BURR) IMPLANT
CANISTER SUCT 3000ML PPV (MISCELLANEOUS) ×3 IMPLANT
CLOSURE WOUND 1/2 X4 (GAUZE/BANDAGES/DRESSINGS) ×1
COVER MAYO STAND STRL (DRAPES) ×3 IMPLANT
DRAPE C-ARM 42X72 X-RAY (DRAPES) IMPLANT
DRAPE LAPAROTOMY 100X72 PEDS (DRAPES) ×3 IMPLANT
DRAPE MICROSCOPE LEICA (MISCELLANEOUS) ×3 IMPLANT
DRAPE POUCH INSTRU U-SHP 10X18 (DRAPES) ×3 IMPLANT
DURAPREP 6ML APPLICATOR 50/CS (WOUND CARE) ×3 IMPLANT
ELECT REM PT RETURN 9FT ADLT (ELECTROSURGICAL) ×3
ELECTRODE REM PT RTRN 9FT ADLT (ELECTROSURGICAL) ×1 IMPLANT
EVACUATOR 3/16  PVC DRAIN (DRAIN) ×2
EVACUATOR 3/16 PVC DRAIN (DRAIN) ×1 IMPLANT
GAUZE SPONGE 4X4 12PLY STRL (GAUZE/BANDAGES/DRESSINGS) ×3 IMPLANT
GAUZE SPONGE 4X4 16PLY XRAY LF (GAUZE/BANDAGES/DRESSINGS) IMPLANT
GLOVE BIOGEL M 8.0 STRL (GLOVE) ×3 IMPLANT
GLOVE EXAM NITRILE LRG STRL (GLOVE) IMPLANT
GLOVE EXAM NITRILE MD LF STRL (GLOVE) IMPLANT
GLOVE EXAM NITRILE XL STR (GLOVE) IMPLANT
GLOVE EXAM NITRILE XS STR PU (GLOVE) IMPLANT
GOWN STRL REUS W/ TWL LRG LVL3 (GOWN DISPOSABLE) ×1 IMPLANT
GOWN STRL REUS W/ TWL XL LVL3 (GOWN DISPOSABLE) IMPLANT
GOWN STRL REUS W/TWL 2XL LVL3 (GOWN DISPOSABLE) ×6 IMPLANT
GOWN STRL REUS W/TWL LRG LVL3 (GOWN DISPOSABLE) ×2
GOWN STRL REUS W/TWL XL LVL3 (GOWN DISPOSABLE)
HEMOSTAT POWDER KIT SURGIFOAM (HEMOSTASIS) ×3 IMPLANT
KIT BASIN OR (CUSTOM PROCEDURE TRAY) ×3 IMPLANT
KIT ROOM TURNOVER OR (KITS) ×3 IMPLANT
NEEDLE SPNL 22GX3.5 QUINCKE BK (NEEDLE) ×3 IMPLANT
NS IRRIG 1000ML POUR BTL (IV SOLUTION) ×3 IMPLANT
PACK LAMINECTOMY NEURO (CUSTOM PROCEDURE TRAY) ×3 IMPLANT
PATTIES SURGICAL .5 X1 (DISPOSABLE) ×3 IMPLANT
RUBBERBAND STERILE (MISCELLANEOUS) ×6 IMPLANT
SPONGE INTESTINAL PEANUT (DISPOSABLE) ×3 IMPLANT
SPONGE SURGIFOAM ABS GEL SZ50 (HEMOSTASIS) ×3 IMPLANT
STRIP CLOSURE SKIN 1/2X4 (GAUZE/BANDAGES/DRESSINGS) ×2 IMPLANT
SUT VIC AB 3-0 SH 8-18 (SUTURE) ×6 IMPLANT
TOWEL OR 17X24 6PK STRL BLUE (TOWEL DISPOSABLE) ×3 IMPLANT
TOWEL OR 17X26 10 PK STRL BLUE (TOWEL DISPOSABLE) ×3 IMPLANT
WATER STERILE IRR 1000ML POUR (IV SOLUTION) ×3 IMPLANT

## 2015-12-20 NOTE — Progress Notes (Addendum)
Pharmacy Antibiotic Note  William [name] is a 60 y.o. male admitted on 12/20/2015 with abscess.  Pharmacy has been consulted for vancomycin dosing.  Plan: Vancomycin 1 IV every 12 hours.  Goal trough 15-20 mcg/mL.  Zosyn 3.375 gm IV q8h  Height: 5\' 10"  (177.8 cm) Weight: 230 lb (104.327 kg) IBW/kg (Calculated) : 73  Temp (24hrs), Avg:98.6 F (37 C), Min:98.6 F (37 C), Max:98.6 F (37 C)   Recent Labs Lab 12/17/15 1446 12/20/15 0515 12/20/15 0551  WBC 8.3 11.7*  --   CREATININE 0.69  --  0.60*    Estimated Creatinine Clearance: 118.8 mL/min (by C-G formula based on Cr of 0.6).    Allergies  Allergen Reactions  . Metformin And Related Other (See Comments)    Possible liver inflammation  . Prednisone Other (See Comments)    Disoriented   . Tamsulosin Other (See Comments)    Nose bleed  . Other Other (See Comments)    Reports all narcotics make him sick. Pt premeds with Reglan to stop nausea    Antimicrobials this admission: Vancomycin 3/16>> Zosyn 3/16>>  Dose adjustments this admission: None  Microbiology results: 3/16 Neck Wound>>  William [name], PharmD, BCPS, [facility] Center[facility]CCCP Clinical Pharmacist Pager [phone] 12/20/2015 12:24 PM

## 2015-12-20 NOTE — Transfer of Care (Signed)
Immediate Anesthesia Transfer of Care Note  Patient: William [name]  Procedure(s) Performed: Procedure(s) with comments: ANTERIOR CERVICAL DECOMPRESSION FOR EPIDURAL ABSCESS (N/A) - ANTERIOR CERVICAL DECOMPRESSION FOR EPIDURAL ABSCESS  Patient Location: ICU  Anesthesia Type:General  Level of Consciousness: sedated and Patient remains intubated per anesthesia plan  Airway & Oxygen Therapy: Patient remains intubated per anesthesia plan and Patient placed on Ventilator (see vital sign flow sheet for setting)  Post-op Assessment: Report given to RN and Post -op Vital signs reviewed and stable  Post vital signs: Reviewed and stable  Last Vitals:  Filed Vitals:   12/20/15 0800 12/20/15 0900  BP: 133/104 149/109  Pulse: 110 116  Resp: 26 15    Complications: No apparent anesthesia complications

## 2015-12-20 NOTE — H&P (Signed)
William [name] is an 60 y.o. male.   Chief Complaint: dysphagia HPI: on 12/05/15 had a 4 level acdf. Woke up with c5 radiculopathy. Saw rehab medicine. Since then he has been complaining of swelling, unable to swallow,fever, redness of neck area and chest. Seen in the er , a ct neck showed most likely an abscess   Past Medical History  Diagnosis Date  . Hypertension   . Diabetes mellitus without complication (HCC)   . Chronic hepatitis C without hepatic coma (HCC)   . History of cocaine use   . Depression   . Arthritis   . History of TIA (transient ischemic attack)   . Hyperlipidemia   . History of migraine headaches   . Obesity, Class II, BMI 35-39.9   . BPH with obstruction/lower urinary tract symptoms   . Balanitis   . Urinary retention   . Tobacco abuse     Past Surgical History  Procedure Laterality Date  . Rotator  cuff surgery       left shoulder   . Spider bite      on neck  . Anterior cervical decomp/discectomy fusion N/A 12/05/2015    Procedure: Anterior Cervical Decompression Fusion Cervical three-four, Cervical four-five, Cervical five-six, Cervical six-seven;  Surgeon: Julio Sicks, MD;  Location: [facility];  Service: Neurosurgery;  Laterality: N/A;    Family History  Problem Relation Age of Onset  . Hypertension Mother   . Diabetes Mother   . Heart disease Mother   . Thyroid disease Mother   . Hypertension Father   . Diabetes Father   . Heart disease Father    Social History:  reports that he has been smoking Cigarettes.  He has a 3 pack-year smoking history. He does not have any smokeless tobacco history on file. He reports that he drinks alcohol. He reports that he uses illicit drugs.  Allergies:  Allergies  Allergen Reactions  . Metformin And Related Other (See Comments)    Possible liver inflammation  . Prednisone Other (See Comments)    Disoriented   . Tamsulosin Other (See Comments)    Nose bleed  . Other Other (See Comments)    Reports all  narcotics make him sick. Pt premeds with Reglan to stop nausea     (Not in a hospital admission)  Results for orders placed or performed during the hospital encounter of 12/20/15 (from the past 48 hour(s))  CBC with Differential/Platelet     Status: Abnormal   Collection Time: 12/20/15  5:15 AM  Result Value Ref Range   WBC 11.7 (H) 4.0 - 10.5 K/uL   RBC 4.68 4.22 - 5.81 MIL/uL   Hemoglobin 14.0 13.0 - 17.0 g/dL   HCT 16.1 09.6 - 04.5 %   MCV 87.4 78.0 - 100.0 fL   MCH 29.9 26.0 - 34.0 pg   MCHC 34.2 30.0 - 36.0 g/dL   RDW 40.9 81.1 - 91.4 %   Platelets 167 150 - 400 K/uL   Neutrophils Relative % 65 %   Neutro Abs 7.6 1.7 - 7.7 K/uL   Lymphocytes Relative 22 %   Lymphs Abs 2.6 0.7 - 4.0 K/uL   Monocytes Relative 11 %   Monocytes Absolute 1.3 (H) 0.1 - 1.0 K/uL   Eosinophils Relative 1 %   Eosinophils Absolute 0.2 0.0 - 0.7 K/uL   Basophils Relative 0 %   Basophils Absolute 0.0 0.0 - 0.1 K/uL  I-stat chem 8, ed     Status: Abnormal  Collection Time: 12/20/15  5:51 AM  Result Value Ref Range   Sodium 137 135 - 145 mmol/L   Potassium 3.6 3.5 - 5.1 mmol/L   Chloride 95 (L) 101 - 111 mmol/L   BUN 8 6 - 20 mg/dL   Creatinine, Ser 1.610.60 (L) 0.61 - 1.24 mg/dL   Glucose, Bld 096182 (H) 65 - 99 mg/dL   Calcium, Ion 0.451.10 (L) 1.13 - 1.30 mmol/L   TCO2 29 0 - 100 mmol/L   Hemoglobin 15.3 13.0 - 17.0 g/dL   HCT 40.945.0 81.139.0 - 91.452.0 %   Ct Soft Tissue Neck W Contrast  12/20/2015  CLINICAL DATA:  Neck swelling. EXAM: CT NECK AND CHEST WITH CONTRAST TECHNIQUE: Axial CT imaging of the neck and chest was obtained after intravenous contrast administration. CONTRAST:  Dose currently not available, see electronic medical record. COMPARISON:  12/09/2015 FINDINGS: CT NECK FINDINGS Recent ACDF from C3-4 to C6-7. In the operative field there is a well-defined gas and fluid collection which extends from the skin surface to the retropharynx and has become progressively low-density. There are new bubbles  throughout the collection. In the superficial neck the abscess measures up to 6 cm. An L-shaped deep component measures 14 cm in continuous length. There is marked mass effect on the supraglottic larynx and lower pharynx. Fat edema surrounds the retropharyngeal abscess and tracks superiorly. Submucosal edema present at the level of the compressed aerodigestive tract. The collection terminates at the thoracic inlet (T1). No visible changes of osteomyelitis. No gross intra canal collection, although limited by CT with hardware. If MRI is needed on clinical grounds, would need airway protection. No venous occlusion. These results were called by telephone at the time of interpretation on 12/20/2015 at 6:50 am to Dr. Tomasita CrumbleADELEKE ONI , who verbally acknowledged these results. CT CHEST FINDINGS THORACIC INLET/BODY WALL: No acute abnormality. MEDIASTINUM: Inflammation stops at the thoracic inlet. No mediastinitis. Normal heart size. No pericardial effusion. No evidence of acute vascular disease. LUNG WINDOWS: Negative for pneumonia or cavities. UPPER ABDOMEN: No acute finding.  23 mm right renal cyst. OSSEOUS: No acute fracture.  No suspicious lytic or blastic lesions. IMPRESSION: 1. Recent ACDF with large surgical site abscess extending from the skin to hardware. Abscess and related edema causes severe airway compromise. 2. The abscess terminates at the thoracic inlet.  No mediastinitis. Electronically Signed   By: Marnee SpringJonathon  Watts M.D.   On: 12/20/2015 06:59   Ct Chest W Contrast  12/20/2015  CLINICAL DATA:  Neck swelling. EXAM: CT NECK AND CHEST WITH CONTRAST TECHNIQUE: Axial CT imaging of the neck and chest was obtained after intravenous contrast administration. CONTRAST:  Dose currently not available, see electronic medical record. COMPARISON:  12/09/2015 FINDINGS: CT NECK FINDINGS Recent ACDF from C3-4 to C6-7. In the operative field there is a well-defined gas and fluid collection which extends from the skin surface to  the retropharynx and has become progressively low-density. There are new bubbles throughout the collection. In the superficial neck the abscess measures up to 6 cm. An L-shaped deep component measures 14 cm in continuous length. There is marked mass effect on the supraglottic larynx and lower pharynx. Fat edema surrounds the retropharyngeal abscess and tracks superiorly. Submucosal edema present at the level of the compressed aerodigestive tract. The collection terminates at the thoracic inlet (T1). No visible changes of osteomyelitis. No gross intra canal collection, although limited by CT with hardware. If MRI is needed on clinical grounds, would need airway protection. No venous  occlusion. These results were called by telephone at the time of interpretation on 12/20/2015 at 6:50 am to Dr. Tomasita Crumble , who verbally acknowledged these results. CT CHEST FINDINGS THORACIC INLET/BODY WALL: No acute abnormality. MEDIASTINUM: Inflammation stops at the thoracic inlet. No mediastinitis. Normal heart size. No pericardial effusion. No evidence of acute vascular disease. LUNG WINDOWS: Negative for pneumonia or cavities. UPPER ABDOMEN: No acute finding.  23 mm right renal cyst. OSSEOUS: No acute fracture.  No suspicious lytic or blastic lesions. IMPRESSION: 1. Recent ACDF with large surgical site abscess extending from the skin to hardware. Abscess and related edema causes severe airway compromise. 2. The abscess terminates at the thoracic inlet.  No mediastinitis. Electronically Signed   By: Marnee Spring M.D.   On: 12/20/2015 06:59   Dg Chest Port 1 View  12/20/2015  CLINICAL DATA:  Shortness of breath. Status post ACDF December 05, 2015. EXAM: PORTABLE CHEST 1 VIEW COMPARISON:  Chest radiograph December 09, 2015 FINDINGS: Cardiomediastinal silhouette is unremarkable for this low inspiratory portable examination with crowded vasculature markings. The lungs are clear without pleural effusions or focal consolidations. Trachea  projects midline and there is no pneumothorax. Included soft tissue planes and osseous structures are non-suspicious. ACDF. IMPRESSION: No acute cardiopulmonary process for this low inspiratory portable examination. Electronically Signed   By: Awilda Metro M.D.   On: 12/20/2015 05:28    Review of Systems  Constitutional: Negative for fever.  Eyes: Negative.   Respiratory: Positive for cough and shortness of breath.   Cardiovascular: Negative.   Gastrointestinal: Negative.   Genitourinary: Negative.   Musculoskeletal: Positive for neck pain.  Skin: Negative.   Neurological: Positive for sensory change and focal weakness.  Psychiatric/Behavioral: Negative.     Blood pressure 138/103, pulse 111, resp. rate 18, height  (1.778 m), weight 104.327 kg (230 lb), SpO2 99 %. Physical Exam hent, nl. Neck, trachea deviated, redness, tenderness, swelling. Lungs, some rales. Cv, nl. Abdomen, nl, extremities, nl. [facility] unable to move deltoids or biceps, triceps, nl. Good grip, sensory loss at c5-6 dermatomes, no biceps dtr. Ct showed most likely an infetcion process  Assessment/Plan    Exploration of cervical wound to drain and r/o compromise of the esophagus. He and his wife want me to take over his care  Karn Cassis, MD 12/20/2015, 9:22 AM

## 2015-12-20 NOTE — Progress Notes (Signed)
Patient ID: William [name], male   DOB: [birth year], 60 y.o.   MRN: [id number] stable, sedated. CCM helping

## 2015-12-20 NOTE — ED Notes (Signed)
Per pt, he had surgery on the cervical spine on March 1. Since surgery, pt has had difficulty swallowing. Noted that pt has bruising to the chest and swelling on the right side of the neck. Lung sounds clear on auscultation. Per pt's wife, pt had a choking episode this am while trying to drink water. Pt c/o pain under the left arm. Denies nausea and vomiting.

## 2015-12-20 NOTE — ED Notes (Signed)
Pt transported to Neuro OR  Report given to CRNA , consent signed

## 2015-12-20 NOTE — ED Notes (Signed)
Pt in no acute distress. 

## 2015-12-20 NOTE — ED Provider Notes (Signed)
CSN: [id number]     Arrival date & time 12/20/15  0436 History   First MD Initiated Contact with Patient 12/20/15 0502     Chief Complaint  Patient presents with  . Dysphagia     (Consider location/radiation/quality/duration/timing/severity/associated sxs/prior Treatment) HPI  William [name] is a 60 y.o. male with PMH of ACDF on 3/1 here with swelling and pain to his neck.  Patient awoke out of sleep, unable to breathe. EMS was called.  He states he symptoms have continued to get worse ever since his surgery.  He also states that he can not move his arms since his surgery.  This is all new for him.  He was evaluated for this mass in the ED with a CT scan but he states it has gotten worse since then.  He denies fevers.  Patient can no longer eat due to the pain.There are no further complaints.  10 Systems reviewed and are negative for acute change except as noted in the HPI.    Past Medical History  Diagnosis Date  . Hypertension   . Diabetes mellitus without complication (HCC)   . Chronic hepatitis C without hepatic coma (HCC)   . History of cocaine use   . Depression   . Arthritis   . History of TIA (transient ischemic attack)   . Hyperlipidemia   . History of migraine headaches   . Obesity, Class II, BMI 35-39.9   . BPH with obstruction/lower urinary tract symptoms   . Balanitis   . Urinary retention   . Tobacco abuse    Past Surgical History  Procedure Laterality Date  . Rotator  cuff surgery       left shoulder   . Spider bite      on neck  . Anterior cervical decomp/discectomy fusion N/A 12/05/2015    Procedure: Anterior Cervical Decompression Fusion Cervical three-four, Cervical four-five, Cervical five-six, Cervical six-seven;  Surgeon: Julio Sicks, MD;  Location: [facility];  Service: Neurosurgery;  Laterality: N/A;   Family History  Problem Relation Age of Onset  . Hypertension Mother   . Diabetes Mother   . Heart disease Mother   . Thyroid disease Mother   .  Hypertension Father   . Diabetes Father   . Heart disease Father    Social History  Substance Use Topics  . Smoking status: Current Every Day Smoker -- 0.10 packs/day for 30 years    Types: Cigarettes    Last Attempt to Quit: 09/27/2014  . Smokeless tobacco: None  . Alcohol Use: 0.0 oz/week    0 Standard drinks or equivalent per week    Review of Systems    Allergies  Metformin and related; Prednisone; Tamsulosin; and Other  Home Medications   Prior to Admission medications   Medication Sig Start Date End Date Taking? Authorizing Provider  amLODipine (NORVASC) 10 MG tablet Take 1 tablet (10 mg total) by mouth daily. 11/06/15  Yes Edwena Felty, MD  BD PEN NEEDLE NANO U/F 32G X 4 MM MISC USE WITH FLEXPEN TO ADMINISTER INSULIN 08/14/15  Yes Edwena Felty, MD  cyclobenzaprine (FLEXERIL) 10 MG tablet Take 1 tablet (10 mg total) by mouth 3 (three) times daily as needed for muscle spasms. 12/07/15  Yes Julio Sicks, MD  diphenhydrAMINE (BENADRYL) 25 MG tablet Take 1 tablet (25 mg total) by mouth every 6 (six) hours. As needed with Compazine for headache. Patient taking differently: Take 25 mg by mouth every 8 (eight) hours as needed for itching  or allergies. As needed with Compazine for headache. 10/27/14  Yes Arby BarretteMarcy Pfeiffer, MD  Elastic Bandages & Supports (THUMB BRACE) MISC 1 Device by Does not apply route daily as needed. 07/10/15  Yes Edwena FeltyAshany Sundaram, MD  fluconazole (DIFLUCAN) 150 MG tablet TAKE 1 TABLET (150 MG TOTAL) BY MOUTH EVERY 7 (SEVEN) DAYS. Patient taking differently: TAKE 1 TABLET (150 MG TOTAL) BY MOUTH EVERY 7 (SEVEN) DAYS on Monday 11/09/15  Yes Edwena FeltyAshany Sundaram, MD  gabapentin (NEURONTIN) 100 MG capsule Take 1 capsule (100 mg total) by mouth 3 (three) times daily. 05/30/15  Yes Edwena FeltyAshany Sundaram, MD  glucose blood (ACCU-CHEK AVIVA) test strip Use as instructed to check blood glucose 4x/day 05/30/15  Yes Edwena FeltyAshany Sundaram, MD  insulin aspart (NOVOLOG FLEXPEN) 100 UNIT/ML FlexPen  Inject 15 Units into the skin 3 (three) times daily with meals. Patient taking differently: Inject 25 Units into the skin 3 (three) times daily with meals.  08/11/15  Yes Vassie Lollarlos Madera, MD  Insulin Glargine (LANTUS SOLOSTAR) 100 UNIT/ML Solostar Pen Inject 30 Units into the skin 2 (two) times daily. Patient taking differently: Inject 50 Units into the skin daily at 10 pm.  08/11/15  Yes Vassie Lollarlos Madera, MD  Insulin Pen Needle (EASY TOUCH PEN NEEDLES) 32G X 6 MM MISC Use with Flexpen to administer Insulin 08/04/14  Yes Radhika P Phadke, MD  Lancets (FREESTYLE) lancets Use as instructed to check blood glucose 4x/day 05/30/15  Yes Edwena FeltyAshany Sundaram, MD  metoCLOPramide (REGLAN) 10 MG tablet Take 1 tablet (10 mg total) by mouth 3 (three) times daily as needed for nausea (headache / nausea). 12/22/14  Yes Garlon HatchetLisa M Sanders, PA-C  oxyCODONE-acetaminophen (PERCOCET/ROXICET) 5-325 MG tablet Take 1-2 tablets by mouth every 6 (six) hours as needed for severe pain. 12/07/15  Yes Julio SicksHenry Pool, MD  pantoprazole (PROTONIX) 40 MG tablet Take 1 tablet (40 mg total) by mouth 2 (two) times daily. Twice a day for 10 days; then start using it once a day Patient taking differently: Take 40 mg by mouth daily.  08/11/15  Yes Vassie Lollarlos Madera, MD  sucralfate (CARAFATE) 1 GM/10ML suspension Take 10 mLs (1 g total) by mouth 4 (four) times daily -  with meals and at bedtime. 09/25/15  Yes Edwena FeltyAshany Sundaram, MD   BP 138/103 mmHg  Pulse 111  Resp 18  Ht 5\' 10"  (1.778 m)  Wt 230 lb (104.327 kg)  BMI 33.00 kg/m2  SpO2 99% Physical Exam  Constitutional: He is oriented to person, place, and time. Vital signs are normal. He appears well-developed and well-nourished.  Non-toxic appearance. He does not appear ill. No distress.  HENT:  Head: Normocephalic and atraumatic.  Nose: Nose normal.  Mouth/Throat: Oropharynx is clear and moist. No oropharyngeal exudate.  exicsion area is warm, red, and appears to be beginning to open.  No drainage., no  pulstile mass  Eyes: Conjunctivae and EOM are normal. Pupils are equal, round, and reactive to light. No scleral icterus.  Neck: Normal range of motion. Neck supple. No tracheal deviation, no edema, no erythema and normal range of motion present. No thyroid mass and no thyromegaly present.  Cardiovascular: Regular rhythm, S1 normal, S2 normal, normal heart sounds, intact distal pulses and normal pulses.  Exam reveals no gallop and no friction rub.   No murmur heard. tachycardic  Pulmonary/Chest: Effort normal and breath sounds normal. No respiratory distress. He has no wheezes. He has no rhonchi. He has no rales.  Abdominal: Soft. Normal appearance and bowel sounds are normal. He  exhibits no distension, no ascites and no mass. There is no hepatosplenomegaly. There is no tenderness. There is no rebound, no guarding and no CVA tenderness.  Musculoskeletal: Normal range of motion. He exhibits no edema or tenderness.  Lymphadenopathy:    He has no cervical adenopathy.  Neurological: He is alert and oriented to person, place, and time. He has normal strength. No cranial nerve deficit or sensory deficit.  Decrease strength BUE  Skin: Skin is warm, dry and intact. No petechiae and no rash noted. He is not diaphoretic. No erythema. No pallor.  Psychiatric: He has a normal mood and affect. His behavior is normal. Judgment normal.  Nursing note and vitals reviewed.   ED Course  Procedures (including critical care time) Labs Review Labs Reviewed  CBC WITH DIFFERENTIAL/PLATELET - Abnormal; Notable for the following:    WBC 11.7 (*)    Monocytes Absolute 1.3 (*)    All other components within normal limits  I-STAT CHEM 8, ED - Abnormal; Notable for the following:    Chloride 95 (*)    Creatinine, Ser 0.60 (*)    Glucose, Bld 182 (*)    Calcium, Ion 1.10 (*)    All other components within normal limits    Imaging Review Ct Soft Tissue Neck W Contrast  12/20/2015  CLINICAL DATA:  Neck swelling.  EXAM: CT NECK AND CHEST WITH CONTRAST TECHNIQUE: Axial CT imaging of the neck and chest was obtained after intravenous contrast administration. CONTRAST:  Dose currently not available, see electronic medical record. COMPARISON:  12/09/2015 FINDINGS: CT NECK FINDINGS Recent ACDF from C3-4 to C6-7. In the operative field there is a well-defined gas and fluid collection which extends from the skin surface to the retropharynx and has become progressively low-density. There are new bubbles throughout the collection. In the superficial neck the abscess measures up to 6 cm. An L-shaped deep component measures 14 cm in continuous length. There is marked mass effect on the supraglottic larynx and lower pharynx. Fat edema surrounds the retropharyngeal abscess and tracks superiorly. Submucosal edema present at the level of the compressed aerodigestive tract. The collection terminates at the thoracic inlet (T1). No visible changes of osteomyelitis. No gross intra canal collection, although limited by CT with hardware. If MRI is needed on clinical grounds, would need airway protection. No venous occlusion. These results were called by telephone at the time of interpretation on 12/20/2015 at 6:50 am to Dr. Tomasita Crumble , who verbally acknowledged these results. CT CHEST FINDINGS THORACIC INLET/BODY WALL: No acute abnormality. MEDIASTINUM: Inflammation stops at the thoracic inlet. No mediastinitis. Normal heart size. No pericardial effusion. No evidence of acute vascular disease. LUNG WINDOWS: Negative for pneumonia or cavities. UPPER ABDOMEN: No acute finding.  23 mm right renal cyst. OSSEOUS: No acute fracture.  No suspicious lytic or blastic lesions. IMPRESSION: 1. Recent ACDF with large surgical site abscess extending from the skin to hardware. Abscess and related edema causes severe airway compromise. 2. The abscess terminates at the thoracic inlet.  No mediastinitis. Electronically Signed   By: Marnee Spring M.D.   On:  12/20/2015 06:59   Ct Chest W Contrast  12/20/2015  CLINICAL DATA:  Neck swelling. EXAM: CT NECK AND CHEST WITH CONTRAST TECHNIQUE: Axial CT imaging of the neck and chest was obtained after intravenous contrast administration. CONTRAST:  Dose currently not available, see electronic medical record. COMPARISON:  12/09/2015 FINDINGS: CT NECK FINDINGS Recent ACDF from C3-4 to C6-7. In the operative field there is a  well-defined gas and fluid collection which extends from the skin surface to the retropharynx and has become progressively low-density. There are new bubbles throughout the collection. In the superficial neck the abscess measures up to 6 cm. An L-shaped deep component measures 14 cm in continuous length. There is marked mass effect on the supraglottic larynx and lower pharynx. Fat edema surrounds the retropharyngeal abscess and tracks superiorly. Submucosal edema present at the level of the compressed aerodigestive tract. The collection terminates at the thoracic inlet (T1). No visible changes of osteomyelitis. No gross intra canal collection, although limited by CT with hardware. If MRI is needed on clinical grounds, would need airway protection. No venous occlusion. These results were called by telephone at the time of interpretation on 12/20/2015 at 6:50 am to Dr. Tomasita Crumble , who verbally acknowledged these results. CT CHEST FINDINGS THORACIC INLET/BODY WALL: No acute abnormality. MEDIASTINUM: Inflammation stops at the thoracic inlet. No mediastinitis. Normal heart size. No pericardial effusion. No evidence of acute vascular disease. LUNG WINDOWS: Negative for pneumonia or cavities. UPPER ABDOMEN: No acute finding.  23 mm right renal cyst. OSSEOUS: No acute fracture.  No suspicious lytic or blastic lesions. IMPRESSION: 1. Recent ACDF with large surgical site abscess extending from the skin to hardware. Abscess and related edema causes severe airway compromise. 2. The abscess terminates at the thoracic  inlet.  No mediastinitis. Electronically Signed   By: Marnee Spring M.D.   On: 12/20/2015 06:59   Dg Chest Port 1 View  12/20/2015  CLINICAL DATA:  Shortness of breath. Status post ACDF December 05, 2015. EXAM: PORTABLE CHEST 1 VIEW COMPARISON:  Chest radiograph December 09, 2015 FINDINGS: Cardiomediastinal silhouette is unremarkable for this low inspiratory portable examination with crowded vasculature markings. The lungs are clear without pleural effusions or focal consolidations. Trachea projects midline and there is no pneumothorax. Included soft tissue planes and osseous structures are non-suspicious. ACDF. IMPRESSION: No acute cardiopulmonary process for this low inspiratory portable examination. Electronically Signed   By: Awilda Metro M.D.   On: 12/20/2015 05:28   I have personally reviewed and evaluated these images and lab results as part of my medical decision-making.   EKG Interpretation None      MDM   Final diagnoses:  Post-operative wound abscess, initial encounter    Patient presents to the ED for neck pain.  CT reveals postop abscess infection.  Will page neurosurgery for admission.    Tomasita Crumble, MD 12/20/15 [phone]

## 2015-12-20 NOTE — Consult Note (Signed)
[facility]       Reason for Consult: post op infection    Referring Physician: Dr. Jeral FruitBotero  Active Problems:   Abscess, neck   Endotracheal tube present   Post-operative wound abscess   Respiratory failure with hypoxia (HCC)   Brodie's abscess of cervical spine (HCC)   . antiseptic oral rinse  7 mL Mouth Rinse 10 times per day  . chlorhexidine gluconate  15 mL Mouth Rinse BID  . feeding supplement (PRO-STAT SUGAR FREE 64)  60 mL Per Tube 5 X Daily  . feeding supplement (VITAL HIGH PROTEIN)  1,000 mL Per Tube Q24H  . insulin aspart  0-15 Units Subcutaneous 6 times per day  . insulin glargine  10 Units Subcutaneous QHS  . pantoprazole (PROTONIX) IV  40 mg Intravenous Daily  . piperacillin-tazobactam (ZOSYN)  IV  3.375 g Intravenous Q8H  . sodium chloride flush  3 mL Intravenous Q12H  . vancomycin  1,000 mg Intravenous Q12H    Recommendations: Continue with vancomycin and zosyn pending cultures   Assessment: He has a post op infection s/p debridement   Antibiotics: Vancomycin and zosyn  HPI: William [name] is a 60 y.o. male with moderate to severe stenosis at C3-4, 4-5 and 5-6 who underwent multilevel discectomy and fusion who presented with C5 radiculopathy this am, then swelling, redness and sent to Ed for evaluation and noted fluid collection on CT concerning for abscess. Taken to OR by Dr. Jeral FruitBotero and I and D, noted brown fluid and sent for culture.  Patient currently in ICU intubated, sedated.   CT neck independently reviewed and fluid collection noted.   Review of Systems:  Unable to be assessed due to mental status All other systems reviewed and are negative   Past Medical History  Diagnosis Date  . Hypertension   . Diabetes mellitus without complication (HCC)   . Chronic hepatitis C without hepatic coma (HCC)   . History of cocaine use   . Depression   . Arthritis   . History of TIA (transient ischemic attack)   . Hyperlipidemia     . History of migraine headaches   . Obesity, Class II, BMI 35-39.9   . BPH with obstruction/lower urinary tract symptoms   . Balanitis   . Urinary retention   . Tobacco abuse     Social History  Substance Use Topics  . Smoking status: Current Every Day Smoker -- 0.10 packs/day for 30 years    Types: Cigarettes    Last Attempt to Quit: 09/27/2014  . Smokeless tobacco: None  . Alcohol Use: 0.0 oz/week    0 Standard drinks or equivalent per week    Family History  Problem Relation Age of Onset  . Hypertension Mother   . Diabetes Mother   . Heart disease Mother   . Thyroid disease Mother   . Hypertension Father   . Diabetes Father   . Heart disease Father     Allergies  Allergen Reactions  . Metformin And Related Other (See Comments)    Possible liver inflammation  . Prednisone Other (See Comments)    Disoriented   . Tamsulosin Other (See Comments)    Nose bleed  . Other Other (See Comments)    Reports all narcotics make him sick. Pt premeds with Reglan to stop nausea    Physical Exam: Constitutional: Filed Vitals:   12/20/15 1545 12/20/15 1600  BP: 111/80 135/93  Pulse: 95 98  Temp:  Resp: 16 16  intubated, sedated EYES: anicteric ENMT: + ET Cardiovascular: Cor RRR Respiratory: clear; GI: Bowel sounds are normal, liver is not enlarged, spleen is not enlarged Musculoskeletal: no pedal edema noted Skin: negatives: no rash Hematologic: no cervical lad  Lab Results  Component Value Date   WBC 11.7* 12/20/2015   HGB 15.3 12/20/2015   HCT 45.0 12/20/2015   MCV 87.4 12/20/2015   PLT 167 12/20/2015    Lab Results  Component Value Date   CREATININE 0.60* 12/20/2015   BUN 8 12/20/2015   NA 137 12/20/2015   K 3.6 12/20/2015   CL 95* 12/20/2015   CO2 28 12/17/2015    Lab Results  Component Value Date   ALT 430* 12/17/2015   AST 325* 12/17/2015   ALKPHOS 68 12/17/2015     Microbiology: Recent Results (from the past 240 hour(s))  MRSA PCR  Screening     Status: None   Collection Time: 12/20/15 12:44 PM  Result Value Ref Range Status   MRSA by PCR NEGATIVE NEGATIVE Final    Comment:        The GeneXpert MRSA Assay (FDA approved for NASAL specimens only), is one component of a comprehensive MRSA colonization surveillance program. It is not intended to diagnose MRSA infection nor to guide or monitor treatment for MRSA infections.     Staci Righter, MD [facility] [email or web address] [phone] pager  [phone] cell 12/20/2015, 4:31 PM

## 2015-12-20 NOTE — Progress Notes (Signed)
Initial Nutrition Assessment  DOCUMENTATION CODES:   Obesity unspecified  INTERVENTION:    Initiate TF via OGT with Vital High Protein at initial goal rate of 5 ml/h (120 ml per day) and Prostat 60 ml 5 times per day to provide 1120 kcals, 161 gm protein, 100 ml free water daily.  Total intake with TF and Propofol will be 2107 kcals. Exceeding calorie goal to ensure adequate protein intake.  When Propofol discontinued, can increase Vital High Protein to 50 ml/h (1200 ml per day) with Prostat 30 ml TID to provide 1500 kcals, 150 gm protein, 1003 ml free water daily.  NUTRITION DIAGNOSIS:   Inadequate oral intake related to inability to eat as evidenced by NPO status.  GOAL:   Provide needs based on ASPEN/SCCM guidelines  MONITOR:   Vent status, Labs, Weight trends, TF tolerance, Skin, I & O's  REASON FOR ASSESSMENT:   Consult Enteral/tube feeding initiation and management  ASSESSMENT:   60yo male smoker with hx HTN, DM, Hep C, spinal stenosis s/p anterior cervical fusion on 3/1. Returned on 3/16 with c/o swelling, dysphagia, fever. CT neck in ER revealed abscess and he was taken urgently to OR and remains intubated post op for airway protection.  Patient is currently intubated on ventilator support Temp (24hrs), Avg:98.6 F (37 C), Min:98.6 F (37 C), Max:98.6 F (37 C)  Propofol: 37.4 ml/hr providing 987 kcals.   Nutrition focused physical exam completed.  No muscle or subcutaneous fat depletion noticed. Received MD Consult for TF initiation and management. Will be unable to meet nutrition goals with high level of propofol.  Diet Order:  Diet NPO time specified  Skin:  Wound (see comment) (surgical neck incision)  Last BM:  3/14  Height:   Ht Readings from Last 1 Encounters:  12/20/15 5\' 10"  (1.778 m)    Weight:   Wt Readings from Last 1 Encounters:  12/20/15 242 lb 15.2 oz (110.2 kg)    Ideal Body Weight:  75.5 kg  BMI:  Body mass index is 34.86  kg/(m^2).  Estimated Nutritional Needs:   Kcal:  1210-1540  Protein:  >/= 150 gm  Fluid:  >/= 1.8 L  EDUCATION NEEDS:   No education needs identified at this time  Joaquin CourtsKimberly Hadlie Gipson, RD, LDN, CNSC Pager [phone] After Hours Pager [phone]

## 2015-12-20 NOTE — Anesthesia Postprocedure Evaluation (Signed)
Anesthesia Post Note  Patient: William [name]  Procedure(s) Performed: Procedure(s) (LRB): ANTERIOR CERVICAL DECOMPRESSION FOR EPIDURAL ABSCESS (N/A)  Patient location during evaluation: SICU Anesthesia Type: General Level of consciousness: sedated Pain management: pain level controlled Vital Signs Assessment: post-procedure vital signs reviewed and stable Respiratory status: patient remains intubated per anesthesia plan Cardiovascular status: stable Anesthetic complications: no    Last Vitals:  Filed Vitals:   12/20/15 0800 12/20/15 0900  BP: 133/104 149/109  Pulse: 110 116  Resp: 26 15    Last Pain:  Filed Vitals:   12/20/15 0924  PainSc: 8                  Kristian Mogg,W. EDMOND

## 2015-12-20 NOTE — Op Note (Signed)
William [name]:  [place], [name]                ACCOUNT NO.:  [id number]  MEDICAL RECORD NO.:  [id number]  LOCATION:  2M04C                        FACILITY:  [facility]  PHYSICIAN:  Hilda LiasErnesto Logen [name], M.D.   DATE OF BIRTH:  [birth year]  DATE OF PROCEDURE:  12/20/2015 DATE OF DISCHARGE:                              OPERATIVE REPORT   PREOPERATIVE DIAGNOSIS:  Cervical hematoma - abscess.  Status post fusion, C3-C7, 2 weeks ago.  POSTOPERATIVE DIAGNOSIS:  Cervical hematoma - abscess.  Status post fusion, C3-C7, 2 weeks ago.  PROCEDURES:  Exploration of the cervical wound drain.  Drainage of a large collection of fluid.  Inspection of the cervical plate. Inspection of the esophagus.  Irrigation of the esophagus with methylene blue.  Microscope.  SURGEON:  Hilda LiasErnesto William [name], M.D.  CLINICAL HISTORY:  The patient underwent fusion about December 05, 2015. After surgery, the patient developed weakness of the deltoid and biceps. Eventually, he was discharged.  He was seen in the emergency room about 4 days later complaining of some mild swelling.  This morning, he came, being unable to swallow, spitting up, low-grade fever, redness of the face and difficulty of chocking.  A CT scan showed that he has a large fluid collection, most likely abscess, all the way down from C4 down to the upper part of the thoracic area with displacement of the trachea. Surgery was advised immediately as emergency.  I talked to him and his family, both then decided for me to take over his care.  DESCRIPTION OF PROCEDURE:  The patient was taken to the OR.  Probably the most difficult part of the procedure was to intubate the patient. The intubation finally was done, awake.  After the intubation was achieved, we cleaned the neck and chest wall with DuraPrep.  Drapes were applied.  Incision was made from the previous one and immediately right underneath the fascia.  There was a large amount of brownish fluid which came at high pressure.  I  did dissection with my finger all the way down to the cervical area where the plate was found.  The specimen was sent to the laboratory for culture and Gram stain.  Then, the area was irrigated with copious amount of fluid.  With the microscope, we inspected the esophagus and there was no evidence of any opening.  Nevertheless, the CRNA and my advice went ahead and put a nasogastric tube.  The tube was brought immediately right below the clavicles and we injected methylene blue with saline solution to see there was any leakage through the esophagus.  That was essentially negative.  During the procedure, I called Dr. Margo AyeHall, who is the neuroradiologist on-call.  I just wanted to be sure that the plate and the graft were in good position as well as the screws.  He told me that was the case.  The area was irrigated. There was a question at the beginning there was some CSF leak, but there was no clear evidence of that I can see.  After that, I left a large Hemovac in the area to drain as a half drainage.  The wound was irrigated and closed with Vicryl and staples.  The patient  is going to remain intubated.  We are going to be calling Critical Care Service to help [place].          ______________________________ Hilda Lias, M.D.     EB/MEDQ  D:  12/20/2015  T:  12/20/2015  Job:  [id number]

## 2015-12-20 NOTE — Consult Note (Signed)
PULMONARY / CRITICAL CARE MEDICINE   Name:William [name] ZOX:[id number] DOB:[birth year]   ADMISSION DATE: 12/20/2015 CONSULTATION DATE: 3/16  REFERRING MD: [name]   CHIEF COMPLAINT: Airway compromise   HISTORY OF PRESENT ILLNESS:  60yo male smoker with hx HTN, DM, Hep C, spinal stenosis s/p anterior cervical fusion on 3/1. Returned on 3/16 with c/o swelling, dysphagia, fever. CT neck in ER revealed abscess and he was taken urgently to OR and remains intubated post op for airway protection. PCCM consulted for vent management.   PAST MEDICAL HISTORY :  He  has a past medical history of Hypertension; Diabetes mellitus without complication (HCC); Chronic hepatitis C without hepatic coma (HCC); History of cocaine use; Depression; Arthritis; History of TIA (transient ischemic attack); Hyperlipidemia; History of migraine headaches; Obesity, Class II, BMI 35-39.9; BPH with obstruction/lower urinary tract symptoms; Balanitis; Urinary retention; and Tobacco abuse.  PAST SURGICAL HISTORY: He  has past surgical history that includes rotator  cuff surgery ; spider bite; and Anterior cervical decomp/discectomy fusion (N/A, 12/05/2015).  Allergies  Allergen Reactions  . Metformin And Related Other (See Comments)    Possible liver inflammation  . Prednisone Other (See Comments)    Disoriented   . Tamsulosin Other (See Comments)    Nose bleed  . Other Other (See Comments)    Reports all narcotics make him sick. Pt premeds with Reglan to stop nausea    No current facility-administered medications on file prior to encounter.   Current Outpatient Prescriptions on File Prior to Encounter  Medication Sig  . amLODipine (NORVASC) 10 MG tablet Take 1 tablet (10 mg total) by mouth daily.  . BD PEN NEEDLE NANO U/F 32G X 4 MM MISC USE WITH FLEXPEN TO ADMINISTER INSULIN  . cyclobenzaprine (FLEXERIL) 10 MG tablet Take 1 tablet (10 mg total) by mouth 3 (three) times daily as needed for  muscle spasms.  . diphenhydrAMINE (BENADRYL) 25 MG tablet Take 1 tablet (25 mg total) by mouth every 6 (six) hours. As needed with Compazine for headache. (Patient taking differently: Take 25 mg by mouth every 8 (eight) hours as needed for itching or allergies. As needed with Compazine for headache.)  . Elastic Bandages & Supports (THUMB BRACE) MISC 1 Device by Does not apply route daily as needed.  . fluconazole (DIFLUCAN) 150 MG tablet TAKE 1 TABLET (150 MG TOTAL) BY MOUTH EVERY 7 (SEVEN) DAYS. (Patient taking differently: TAKE 1 TABLET (150 MG TOTAL) BY MOUTH EVERY 7 (SEVEN) DAYS on Monday)  . gabapentin (NEURONTIN) 100 MG capsule Take 1 capsule (100 mg total) by mouth 3 (three) times daily.  Marland Kitchen glucose blood (ACCU-CHEK AVIVA) test strip Use as instructed to check blood glucose 4x/day  . insulin aspart (NOVOLOG FLEXPEN) 100 UNIT/ML FlexPen Inject 15 Units into the skin 3 (three) times daily with meals. (Patient taking differently: Inject 25 Units into the skin 3 (three) times daily with meals. )  . Insulin Glargine (LANTUS SOLOSTAR) 100 UNIT/ML Solostar Pen Inject 30 Units into the skin 2 (two) times daily. (Patient taking differently: Inject 50 Units into the skin daily at 10 pm. )  . Insulin Pen Needle (EASY TOUCH PEN NEEDLES) 32G X 6 MM MISC Use with Flexpen to administer Insulin  . Lancets (FREESTYLE) lancets Use as instructed to check blood glucose 4x/day  . metoCLOPramide (REGLAN) 10 MG tablet Take 1 tablet (10 mg total) by mouth 3 (three) times daily as needed for nausea (headache / nausea).  Marland Kitchen oxyCODONE-acetaminophen (PERCOCET/ROXICET) 5-325 MG tablet Take 1-2  tablets by mouth every 6 (six) hours as needed for severe pain.  . pantoprazole (PROTONIX) 40 MG tablet Take 1 tablet (40 mg total) by mouth 2 (two) times daily. Twice a day for 10 days; then start using it once a day (Patient taking differently: Take 40 mg by mouth daily. )  . sucralfate (CARAFATE) 1 GM/10ML suspension Take 10 mLs (1 g  total) by mouth 4 (four) times daily -  with meals and at bedtime.    FAMILY HISTORY:  His has no family status information on file.   SOCIAL HISTORY: He  reports that he has been smoking Cigarettes.  He has a 3 pack-year smoking history. He does not have any smokeless tobacco history on file. He reports that he drinks alcohol. He reports that he uses illicit drugs.  REVIEW OF SYSTEMS:  Unable to obtain as pt is encephalopathic.  SUBJECTIVE: On vent.  Opens eyes to voice.  VITAL SIGNS: BP 149/109 mmHg  Pulse 116  Temp(Src) 98.6 F (37 C)  Resp 15  Ht  (1.778 m)  Wt 110.2 kg (242 lb 15.2 oz)  BMI 34.86 kg/m2  SpO2 98%  HEMODYNAMICS:    VENTILATOR SETTINGS: Vent Mode:  [-] PRVC FiO2 (%):  [40 %] 40 % Set Rate:  [16 bmp] 16 bmp Vt Set:  [550 mL] 550 mL PEEP:  [5 cmH20] 5 cmH20 Plateau Pressure:  [23 cmH20] 23 cmH20  INTAKE / OUTPUT:     PHYSICAL EXAMINATION: General: wdwn male, NAD sedated on vent  Neuro: Sedated, RASS -1 HEENT: Mm moist, ETT, anterior neck dressing c/d, JP drain serosang Cardiovascular: s1s2 rrr Lungs: resps even non labored on vent, coarse  Abdomen: Round, mildly distended  Musculoskeletal: Warm and dry, no edema   LABS:  BMET  Recent Labs Lab 12/17/15 1446 12/20/15 0551  NA 137 137  K 3.8 3.6  CL 102 95*  CO2 28  --   BUN 7 8  CREATININE 0.69 0.60*  GLUCOSE 150* 182*    Electrolytes  Recent Labs Lab 12/17/15 1446  CALCIUM 9.0    CBC  Recent Labs Lab 12/17/15 1446 12/20/15 0515 12/20/15 0551  WBC 8.3 11.7*  --   HGB 14.5 14.0 15.3  HCT 43.7 40.9 45.0  PLT 147* 167  --     Coag's No results for input(s): APTT, INR in the last 168 hours.  Sepsis Markers No results for input(s): LATICACIDVEN, PROCALCITON, O2SATVEN in the last 168 hours.  ABG No results for input(s): PHART, PCO2ART, PO2ART in the last 168 hours.  Liver Enzymes  Recent Labs Lab 12/17/15 1446  AST 325*  ALT 430*  ALKPHOS 68   BILITOT 0.7  ALBUMIN 3.1*    Cardiac Enzymes No results for input(s): TROPONINI, PROBNP in the last 168 hours.  Glucose  Recent Labs Lab 12/20/15 1153  GLUCAP 159*    Imaging Ct Soft Tissue Neck W Contrast  12/20/2015  CLINICAL DATA:  Neck swelling. EXAM: CT NECK AND CHEST WITH CONTRAST TECHNIQUE: Axial CT imaging of the neck and chest was obtained after intravenous contrast administration. CONTRAST:  Dose currently not available, see electronic medical record. COMPARISON:  12/09/2015 FINDINGS: CT NECK FINDINGS Recent ACDF from C3-4 to C6-7. In the operative field there is a well-defined gas and fluid collection which extends from the skin surface to the retropharynx and has become progressively low-density. There are new bubbles throughout the collection. In the superficial neck the abscess measures up to 6 cm. An L-shaped  deep component measures 14 cm in continuous length. There is marked mass effect on the supraglottic larynx and lower pharynx. Fat edema surrounds the retropharyngeal abscess and tracks superiorly. Submucosal edema present at the level of the compressed aerodigestive tract. The collection terminates at the thoracic inlet (T1). No visible changes of osteomyelitis. No gross intra canal collection, although limited by CT with hardware. If MRI is needed on clinical grounds, would need airway protection. No venous occlusion. These results were called by telephone at the time of interpretation on 12/20/2015 at 6:50 am to Dr. Tomasita CrumbleADELEKE ONI , who verbally acknowledged these results. CT CHEST FINDINGS THORACIC INLET/BODY WALL: No acute abnormality. MEDIASTINUM: Inflammation stops at the thoracic inlet. No mediastinitis. Normal heart size. No pericardial effusion. No evidence of acute vascular disease. LUNG WINDOWS: Negative for pneumonia or cavities. UPPER ABDOMEN: No acute finding.  23 mm right renal cyst. OSSEOUS: No acute fracture.  No suspicious lytic or blastic lesions. IMPRESSION: 1.  Recent ACDF with large surgical site abscess extending from the skin to hardware. Abscess and related edema causes severe airway compromise. 2. The abscess terminates at the thoracic inlet.  No mediastinitis. Electronically Signed   By: Marnee SpringJonathon  Watts M.D.   On: 12/20/2015 06:59   Ct Chest W Contrast  12/20/2015  CLINICAL DATA:  Neck swelling. EXAM: CT NECK AND CHEST WITH CONTRAST TECHNIQUE: Axial CT imaging of the neck and chest was obtained after intravenous contrast administration. CONTRAST:  Dose currently not available, see electronic medical record. COMPARISON:  12/09/2015 FINDINGS: CT NECK FINDINGS Recent ACDF from C3-4 to C6-7. In the operative field there is a well-defined gas and fluid collection which extends from the skin surface to the retropharynx and has become progressively low-density. There are new bubbles throughout the collection. In the superficial neck the abscess measures up to 6 cm. An L-shaped deep component measures 14 cm in continuous length. There is marked mass effect on the supraglottic larynx and lower pharynx. Fat edema surrounds the retropharyngeal abscess and tracks superiorly. Submucosal edema present at the level of the compressed aerodigestive tract. The collection terminates at the thoracic inlet (T1). No visible changes of osteomyelitis. No gross intra canal collection, although limited by CT with hardware. If MRI is needed on clinical grounds, would need airway protection. No venous occlusion. These results were called by telephone at the time of interpretation on 12/20/2015 at 6:50 am to Dr. Tomasita CrumbleADELEKE ONI , who verbally acknowledged these results. CT CHEST FINDINGS THORACIC INLET/BODY WALL: No acute abnormality. MEDIASTINUM: Inflammation stops at the thoracic inlet. No mediastinitis. Normal heart size. No pericardial effusion. No evidence of acute vascular disease. LUNG WINDOWS: Negative for pneumonia or cavities. UPPER ABDOMEN: No acute finding.  23 mm right renal cyst.  OSSEOUS: No acute fracture.  No suspicious lytic or blastic lesions. IMPRESSION: 1. Recent ACDF with large surgical site abscess extending from the skin to hardware. Abscess and related edema causes severe airway compromise. 2. The abscess terminates at the thoracic inlet.  No mediastinitis. Electronically Signed   By: Marnee SpringJonathon  Watts M.D.   On: 12/20/2015 06:59   Dg Chest Port 1 View  12/20/2015  CLINICAL DATA:  Shortness of breath. Status post ACDF December 05, 2015. EXAM: PORTABLE CHEST 1 VIEW COMPARISON:  Chest radiograph December 09, 2015 FINDINGS: Cardiomediastinal silhouette is unremarkable for this low inspiratory portable examination with crowded vasculature markings. The lungs are clear without pleural effusions or focal consolidations. Trachea projects midline and there is no pneumothorax. Included soft tissue planes  and osseous structures are non-suspicious. ACDF. IMPRESSION: No acute cardiopulmonary process for this low inspiratory portable examination. Electronically Signed   By: Awilda Metro M.D.   On: 12/20/2015 05:28    STUDIES:  CT chest / soft tissue neck 03/16 > Recent ACDF with large surgical site abscess extending from skin to hardware. Abscess and related edema causes severe airway compromise. No mediastinitis.  CULTURES: Wound 03/16 >  ANTIBIOTICS: Vanc 03/16 > Zosyn 03/16 >  SIGNIFICANT EVENTS: 03/01 > ACDF. 03/16 > back to OR for anterior cervical decompression for epidural abscess.  LINES/TUBES: ETT 03/16 > Neck hemovac 03/16 >  DISCUSSION: 60 y.o. M who had ACDF on 03/01. Had C5 radiculopathy following surgery then went to rehab. Since then had swelling with difficulty swallowing, fever, erythema. He was found to have surgical site abscess; therefore, was taken back to OR for anterior cervical decompression for epidural abscess.  ASSESSMENT / PLAN:  NEUROLOGIC A:  Acute metabolic encephalopathy - due to sedation. Hx cocaine abuse, depression,  arthritis, TIA. Cervical stenosis - s/p ACDF 03/01. Had surgical site abscess; therefore, was taken to OR 03/16 for I&D and anterior decompression. P:  Sedation: Propofol gtt / Fentanyl gtt. RASS goal: 0 to -1. Daily WUA. Post op care per neurosurgery. Hold outpatient cyclobenzaprine, diphenhydramine, gabapentin, percocet.  PULMONARY A: VDRF - following anterior cervical decompression for epidural abscess 03/16. Tobacco use disorder. P:  Full vent support. Wean as able. VAP prevention measures. SBT in AM if able. CXR in AM. Tobacco cessation counseling once extubated.  CARDIOVASCULAR A:  Hx HTN, HLD. P:  Monitor hemodynamics. Hold outpatient amlodipine. Assess lactate.  RENAL A:  Hypocalcemia. P:  NS @ 100. 1g Ca gluconate. BMP in AM.  GASTROINTESTINAL A:  Hx HCV. GI prophylaxis. Nutrition. Obesity. P:  SUP: Pantoprazole. NPO. likley to remain on vent for 24-48 hrs, start TF  HEMATOLOGIC A:  VTE Prophylaxis. P:  SCD's. CBC in AM.  INFECTIOUS A:  Epidural abscess - s/p I&D with anterior cervical decompression. P:  Abx as above (vanc / zosyn).  Follow cultures as above. PCT algorithm to limit abx exposure.  ENDOCRINE A:  DM.  P:  SSI. Lantus 10u daily (takes 50u as outpatient).   Family updated: Fiance at bedside.  Interdisciplinary Family Meeting v Palliative Care Meeting: Due by: 03/23.  CC time: 35 minutes.  Rutherford Guys, [place] [facility] Pager: [phone]  or [phone] 12/20/2015, 12:33 PM   STAFF NOTE: I, Rory Percy, MD FACP have personally reviewed patient's available data, including medical history, events of note, physical examination and test results as part of my evaluation. I have discussed with resident/NP and other care providers such as pharmacist, RN and RRT. In addition, I personally evaluated patient and elicited key findings of: post op  agitation now noted, lungs clear, drain in place, requires fent / propofol, limit any WUA with unstable airway concerns, get abg now on 8 cc/kg, pcxr reviewed, advance ett 2 cm, repeat pcxr in am , will need leak testing daily, no role steroids now, start TF as will remain inubtated, get lactic, keep pos balance, no role flagyl, add vanc / zosyn, follow OR cultures, family updated at bedside by me The patient is critically ill with multiple organ systems failure and requires high complexity decision making for assessment and support, frequent evaluation and titration of therapies, application of advanced monitoring technologies and extensive interpretation of multiple databases.  Critical Care Time devoted to patient care services described in this note is35 Minutes. This time reflects time of care of this signee: Rory Percy, MD FACP. This critical care time does not reflect procedure time, or teaching time or supervisory time of PA/NP/Med student/Med Resident etc but could involve care discussion time. Rest per NP/medical resident whose note is outlined above and that I agree with   Mcarthur Rossetti. Tyson Alias, MD, FACP Pgr: [phone] [facility] 12/20/2015 1:08 PM

## 2015-12-20 NOTE — Progress Notes (Addendum)
RT advanced pt ETT 2cm per MD. Tube holder was also applied and pink tape removed.  No complications. Vital signs stable. RN and wife at bedside. RT will continue to monitor.

## 2015-12-20 NOTE — Anesthesia Preprocedure Evaluation (Addendum)
Anesthesia Evaluation  Patient identified by MRN, date of birth, ID band Patient awake    Reviewed: Allergy & Precautions, H&P , NPO status , Patient's Chart, lab work & pertinent test results  Airway Mallampati: III  TM Distance: >3 FB Neck ROM: Limited Positive for:  Tracheal deviation Mouth opening: Limited Mouth Opening  Dental no notable dental hx. (+) Teeth Intact, Dental Advisory Given   Pulmonary Current Smoker,    Pulmonary exam normal breath sounds clear to auscultation       Cardiovascular hypertension, Pt. on medications  Rhythm:Regular Rate:Normal     Neuro/Psych  Headaches, Depression    GI/Hepatic Neg liver ROS, GERD  Medicated and Controlled,  Endo/Other  diabetes, Type 1, Insulin Dependent  Renal/GU negative Renal ROS  negative genitourinary   Musculoskeletal  (+) Arthritis , Osteoarthritis,    Abdominal   Peds  Hematology negative hematology ROS (+)   Anesthesia Other Findings   Reproductive/Obstetrics negative OB ROS                            Anesthesia Physical Anesthesia Plan  ASA: III and emergent  Anesthesia Plan: General   Post-op Pain Management:    Induction: Intravenous  Airway Management Planned: Video Laryngoscope Planned, Awake Intubation Planned, Oral ETT and Fiberoptic Intubation Planned  Additional Equipment:   Intra-op Plan:   Post-operative Plan: Post-operative intubation/ventilation  Informed Consent: I have reviewed the patients History and Physical, chart, labs and discussed the procedure including the risks, benefits and alternatives for the proposed anesthesia with the patient or authorized representative who has indicated his/her understanding and acceptance.   Dental advisory given  Plan Discussed with: CRNA  Anesthesia Plan Comments:        Anesthesia Quick Evaluation

## 2015-12-21 ENCOUNTER — Encounter (HOSPITAL_COMMUNITY): Payer: Self-pay | Admitting: Neurosurgery

## 2015-12-21 ENCOUNTER — Inpatient Hospital Stay (HOSPITAL_COMMUNITY): Payer: Medicaid Other

## 2015-12-21 DIAGNOSIS — L0211 Cutaneous abscess of neck: Secondary | ICD-10-CM | POA: Insufficient documentation

## 2015-12-21 DIAGNOSIS — Z4659 Encounter for fitting and adjustment of other gastrointestinal appliance and device: Secondary | ICD-10-CM | POA: Insufficient documentation

## 2015-12-21 DIAGNOSIS — M868X8 Other osteomyelitis, other site: Secondary | ICD-10-CM

## 2015-12-21 DIAGNOSIS — T814XXA Infection following a procedure, initial encounter: Principal | ICD-10-CM

## 2015-12-21 LAB — CBC WITH DIFFERENTIAL/PLATELET
BASOS ABS: 0 10*3/uL (ref 0.0–0.1)
Basophils Relative: 0 %
EOS ABS: 0.1 10*3/uL (ref 0.0–0.7)
Eosinophils Relative: 2 %
HCT: 34.5 % — ABNORMAL LOW (ref 39.0–52.0)
HEMOGLOBIN: 11.5 g/dL — AB (ref 13.0–17.0)
LYMPHS ABS: 2.8 10*3/uL (ref 0.7–4.0)
LYMPHS PCT: 42 %
MCH: 29.6 pg (ref 26.0–34.0)
MCHC: 33.3 g/dL (ref 30.0–36.0)
MCV: 88.9 fL (ref 78.0–100.0)
Monocytes Absolute: 0.7 10*3/uL (ref 0.1–1.0)
Monocytes Relative: 11 %
NEUTROS PCT: 45 %
Neutro Abs: 2.9 10*3/uL (ref 1.7–7.7)
PLATELETS: 128 10*3/uL — AB (ref 150–400)
RBC: 3.88 MIL/uL — AB (ref 4.22–5.81)
RDW: 14.6 % (ref 11.5–15.5)
WBC: 6.6 10*3/uL (ref 4.0–10.5)

## 2015-12-21 LAB — COMPREHENSIVE METABOLIC PANEL
ALT: 328 U/L — ABNORMAL HIGH (ref 17–63)
AST: 194 U/L — ABNORMAL HIGH (ref 15–41)
Albumin: 2.1 g/dL — ABNORMAL LOW (ref 3.5–5.0)
Alkaline Phosphatase: 64 U/L (ref 38–126)
Anion gap: 9 (ref 5–15)
BILIRUBIN TOTAL: 0.7 mg/dL (ref 0.3–1.2)
BUN: 10 mg/dL (ref 6–20)
CHLORIDE: 102 mmol/L (ref 101–111)
CO2: 26 mmol/L (ref 22–32)
Calcium: 8.1 mg/dL — ABNORMAL LOW (ref 8.9–10.3)
Creatinine, Ser: 0.85 mg/dL (ref 0.61–1.24)
Glucose, Bld: 266 mg/dL — ABNORMAL HIGH (ref 65–99)
POTASSIUM: 4 mmol/L (ref 3.5–5.1)
Sodium: 137 mmol/L (ref 135–145)
TOTAL PROTEIN: 5 g/dL — AB (ref 6.5–8.1)

## 2015-12-21 LAB — MAGNESIUM: MAGNESIUM: 1.6 mg/dL — AB (ref 1.7–2.4)

## 2015-12-21 LAB — GLUCOSE, CAPILLARY
GLUCOSE-CAPILLARY: 206 mg/dL — AB (ref 65–99)
GLUCOSE-CAPILLARY: 221 mg/dL — AB (ref 65–99)
GLUCOSE-CAPILLARY: 236 mg/dL — AB (ref 65–99)
GLUCOSE-CAPILLARY: 242 mg/dL — AB (ref 65–99)
Glucose-Capillary: 234 mg/dL — ABNORMAL HIGH (ref 65–99)
Glucose-Capillary: 156 mg/dL — ABNORMAL HIGH (ref 65–99)

## 2015-12-21 LAB — PHOSPHORUS: PHOSPHORUS: 3.2 mg/dL (ref 2.5–4.6)

## 2015-12-21 MED ORDER — INSULIN GLARGINE 100 UNIT/ML ~~LOC~~ SOLN
25.0000 [IU] | Freq: Every day | SUBCUTANEOUS | Status: DC
  Administered 2015-12-21 – 2015-12-26 (×6): 25 [IU] via SUBCUTANEOUS
  Filled 2015-12-21 (×9): qty 0.25

## 2015-12-21 MED ORDER — GABAPENTIN 100 MG PO CAPS
100.0000 mg | ORAL_CAPSULE | Freq: Three times a day (TID) | ORAL | Status: DC
  Administered 2015-12-21 – 2015-12-27 (×10): 100 mg via ORAL
  Filled 2015-12-21 (×18): qty 1

## 2015-12-21 MED ORDER — LACTULOSE 10 GM/15ML PO SOLN
30.0000 g | Freq: Two times a day (BID) | ORAL | Status: DC
  Administered 2015-12-21 – 2015-12-25 (×5): 30 g via ORAL
  Filled 2015-12-21 (×14): qty 45

## 2015-12-21 MED ORDER — HEPARIN SODIUM (PORCINE) 5000 UNIT/ML IJ SOLN
5000.0000 [IU] | Freq: Three times a day (TID) | INTRAMUSCULAR | Status: DC
  Administered 2015-12-21 – 2015-12-27 (×18): 5000 [IU] via SUBCUTANEOUS
  Filled 2015-12-21 (×20): qty 1

## 2015-12-21 NOTE — Consult Note (Addendum)
PULMONARY / CRITICAL CARE MEDICINE   Name:William [name]:[id number] DOB:[birth year]   ADMISSION DATE: 12/20/2015 CONSULTATION DATE: 3/16  REFERRING MD: [name]   CHIEF COMPLAINT: Airway compromise   HISTORY OF PRESENT ILLNESS:  60yo male smoker with hx HTN, DM, Hep C, spinal stenosis s/p anterior cervical fusion on 3/1. Returned on 3/16 with c/o swelling, dysphagia, fever. CT neck in ER revealed abscess and he was taken urgently to OR and remains intubated post op for airway protection. PCCM consulted for vent management.   SUBJECTIVE: remains on vent, calm, TF on going  VITAL SIGNS: BP 96/70 mmHg  Pulse 88  Temp(Src) 98.6 F (37 C) (Oral)  Resp 16  Ht  (1.778 m)  Wt 111 kg (244 lb 11.4 oz)  BMI 35.11 kg/m2  SpO2 98%  HEMODYNAMICS:    VENTILATOR SETTINGS: Vent Mode:  [-] PRVC FiO2 (%):  [40 %] 40 % Set Rate:  [16 bmp] 16 bmp Vt Set:  [550 mL] 550 mL PEEP:  [5 cmH20] 5 cmH20 Plateau Pressure:  [18 cmH20-23 cmH20] 19 cmH20  INTAKE / OUTPUT: I/O last 3 completed shifts: In: 6122.1 [I.V.:5442.1; NG/GT:220; IV Piggyback:460] Out: 1800 [Urine:1700; Blood:100]   PHYSICAL EXAMINATION: General: wdwn male, NAD sedated on vent  Neuro: Sedated, RASS -2 HEENT: Mm moist, ETT, anterior neck dressing c/d, JP drain serosang, vac, airway central Cardiovascular: s1s2 rrr Lungs: CTA reduced Abdomen: Round, mildly distended  Musculoskeletal: Warm and dry, no edema   LABS:  BMET  Recent Labs Lab 12/17/15 1446 12/20/15 0551 12/21/15 0400  NA 137 137 137  K 3.8 3.6 4.0  CL 102 95* 102  CO2 28  --  26  BUN CREATININE 0.69 0.60* 0.85  GLUCOSE 150* 182* 266*    Electrolytes  Recent Labs Lab 12/17/15 1446 12/21/15 0400  CALCIUM 9.0 8.1*  MG  --  1.6*  PHOS  --  3.2    CBC  Recent Labs Lab 12/17/15 1446 12/20/15 0515 12/20/15 0551 12/21/15 0400  WBC 8.3 11.7*  --  6.6  HGB 14.5 14.0 15.3 11.5*  HCT 43.7 40.9  45.0 34.5*  PLT 147* 167  --  128*    Coag's No results for input(s): APTT, INR in the last 168 hours.  Sepsis Markers  Recent Labs Lab 12/20/15 1258 12/20/15 1420  LATICACIDVEN 1.4 1.5    ABG  Recent Labs Lab 12/20/15 1335  PHART 7.332*  PCO2ART 53.5*  PO2ART 111.0*    Liver Enzymes  Recent Labs Lab 12/17/15 1446 12/21/15 0400  AST 325* 194*  ALT 430* 328*  ALKPHOS 68 64  BILITOT 0.7 0.7  ALBUMIN 3.1* 2.1*    Cardiac Enzymes No results for input(s): TROPONINI, PROBNP in the last 168 hours.  Glucose  Recent Labs Lab 12/20/15 1153 12/20/15 1521 12/20/15 2028 12/20/15 2337 12/21/15 0345 12/21/15 0813  GLUCAP 159* 178* 172* 221* 234* 236*    Imaging Dg Chest Port 1 View  12/21/2015  CLINICAL DATA:  Back abscess, respiratory failure, ventilatory support EXAM: PORTABLE CHEST 1 VIEW COMPARISON:  12/20/2015 FINDINGS: Endotracheal tube 3.4 cm above the carina. NG tube has been advanced and is within the stomach. Postop changes of the thoracic inlet with overlying staples. Low lung volumes evident with minor bibasilar atelectasis. No focal pneumonia, collapse or consolidation. No enlarging effusion or pneumothorax. IMPRESSION: Persistent low lung volumes with bibasilar atelectasis. NG tube now within the stomach. Stable endotracheal tube position. Electronically Signed   By: Judie Petit.  Shick  M.D.   On: 12/21/2015 07:50   Dg Chest Portable 1 View  12/20/2015  CLINICAL DATA:  Intubation EXAM: PORTABLE CHEST 1 VIEW COMPARISON:  12/20/2015 FINDINGS: Endotracheal tube in good position. NG tube in the thoracic esophagus at approximately T4 level. Bibasilar atelectasis with hypo ventilation of the lungs. Negative for heart failure or effusion IMPRESSION: Endotracheal tube in good position.  Bibasilar atelectasis. NG tube in the esophagus pump approximately T4 level. Electronically Signed   By: Marlan Palauharles  Clark M.D.   On: 12/20/2015 13:11   Dg Abd Portable 1v  12/20/2015   CLINICAL DATA:  Evaluate OG tube placement. EXAM: PORTABLE ABDOMEN - 1 VIEW COMPARISON:  None. FINDINGS: The OG tube terminates in the left upper quadrant with the side port and tip below the GE junction. Mild fecal loading in the right colon. IMPRESSION: The OG tube is in good position. Electronically Signed   By: Gerome Samavid  Williams III M.D   On: 12/20/2015 16:47    STUDIES:  CT chest / soft tissue neck 03/16 > Recent ACDF with large surgical site abscess extending from skin to hardware. Abscess and related edema causes severe airway compromise. No mediastinitis.  CULTURES: Wound 03/16 >  ANTIBIOTICS: Vanc 03/16 > Zosyn 03/16 >  SIGNIFICANT EVENTS: 03/01 > ACDF. 03/16 > back to OR for anterior cervical decompression for epidural abscess.  LINES/TUBES: ETT 03/16 > Neck hemovac 03/16 >  DISCUSSION: 60 y.o. M who had ACDF on 03/01. Had C5 radiculopathy following surgery then went to rehab. Since then had swelling with difficulty swallowing, fever, erythema. He was found to have surgical site abscess; therefore, was taken back to OR for anterior cervical decompression for epidural abscess.  ASSESSMENT / PLAN:  NEUROLOGIC A:  Acute metabolic encephalopathy - due to sedation. Hx cocaine abuse, depression, arthritis, TIA. Cervical stenosis - s/p ACDF 03/01. Had surgical site abscess; therefore, was taken to OR 03/16 for I&D and anterior decompression. P:  Sedation: Propofol gtt / Fentanyl gtt, WUA mandatory RASS goal: -1 Post op care per neurosurgery, drain likely out in am  Hold outpatient cyclobenzaprine, diphenhydramine,  Re add gabapentin  PULMONARY A: VDRF - following anterior cervical decompression for epidural abscess 03/16. Tobacco use disorder. Difficult airway  ( 1 Hour per NS to obtain awake intubation in OR) P:  ABg reviewed, keep same MV Weaning cpap 5ps5 goal 2 hr, then dc, no extubation pcxr in am for atx worsening? Will need glide and bronch at  bedside when extubated over weekend Get daily leak test  CARDIOVASCULAR A:  Hx HTN, HLD. P:  Monitor hemodynamics, may need some prop redcution Hold outpatient amlodipine. Assess lactate.  RENAL A:  Hypocalcemia. P:  NS @ 100. 1g Ca gluconate. BMP in AM.  GASTROINTESTINAL A:  Hx HCV. GI prophylaxis. Nutrition. Obesity. Shock liver>/? In setting hep c P:  SUP: Pantoprazole. TF to goal 5? , will d/w nutrition lft again am  Constipation, add lactulose  HEMATOLOGIC A:  VTE Prophylaxis. P:  SCD's. CBC in AM. Add sub q hep  INFECTIOUS A:  Epidural abscess - s/p I&D with anterior cervical decompression. P:  Abx as above (vanc / zosyn).  Follow culture from OR  ENDOCRINE A:  DM.  P:  SSI. Lantus 10u daily (takes 50u as outpatient). Increase to 25   Family updated: Fiance at bedside 3/16  Interdisciplinary Family Meeting v Palliative Care Meeting: Due by: 03/23.  CC time: 30 minutes.  Mcarthur Rossettianiel J. Tyson AliasFeinstein, MD, FACP Pgr: [phone] [facility]

## 2015-12-21 NOTE — Progress Notes (Signed)
UR Completed. Corney Knighton, RN, BSN.  [phone] 

## 2015-12-21 NOTE — Progress Notes (Signed)
    [facility]   Reason for visit: Follow up on neck abscess  Interval History: no growth to date.  On broad spectrum antibiotics. No fever.   Physical Exam: Constitutional:  Filed Vitals:   12/21/15 0830 12/21/15 0934  BP: 96/67   Pulse: 84   Temp:  98.6 F (37 C)  Resp: 12   remains intubated, sedated Respiratory: CTA B, anterior exam; on vent Cardiovascular: RRR  Review of Systems: Unable to be assessed due to mental status  Lab Results  Component Value Date   WBC 6.6 12/21/2015   HGB 11.5* 12/21/2015   HCT 34.5* 12/21/2015   MCV 88.9 12/21/2015   PLT 128* 12/21/2015    Lab Results  Component Value Date   CREATININE 0.85 12/21/2015   BUN 10 12/21/2015   NA 137 12/21/2015   K 4.0 12/21/2015   CL 102 12/21/2015   CO2 26 12/21/2015    Lab Results  Component Value Date   ALT 328* 12/21/2015   AST 194* 12/21/2015   ALKPHOS 64 12/21/2015     Microbiology: Recent Results (from the past 240 hour(s))  Wound culture     Status: None (Preliminary result)   Collection Time: 12/20/15 10:20 AM  Result Value Ref Range Status   Specimen Description WOUND  Final   Special Requests CERVICAL WOUND  Final   Gram Stain   Final    MODERATE WBC PRESENT,BOTH PMN AND MONONUCLEAR NO SQUAMOUS EPITHELIAL CELLS SEEN NO ORGANISMS SEEN Performed at [facility]    Culture PENDING  Incomplete   Report Status PENDING  Incomplete  MRSA PCR Screening     Status: None   Collection Time: 12/20/15 12:44 PM  Result Value Ref Range Status   MRSA by PCR NEGATIVE NEGATIVE Final    Comment:        The GeneXpert MRSA Assay (FDA approved for NASAL specimens only), is one component of a comprehensive MRSA colonization surveillance program. It is not intended to diagnose MRSA infection nor to guide or monitor treatment for MRSA infections.     Impression/Plan:  1. Post op infection - no growth to date.  Will narrow as appropriate 2. Abx - creat  stable on vancomycin

## 2015-12-21 NOTE — Progress Notes (Signed)
Inpatient Diabetes Program Recommendations  AACE/ADA: New Consensus Statement on Inpatient Glycemic Control (2015)  Target Ranges:  Prepandial:   less than 140 mg/dL      Peak postprandial:   less than 180 mg/dL (1-2 hours)      Critically ill patients:  140 - 180 mg/dL  Results for William [name], William [name] (MRN [id number]) as of 12/21/2015 11:29  Ref. Range 12/07/2015 06:20 12/07/2015 11:44 12/07/2015 16:35 12/09/2015 20:53 12/20/2015 11:53 12/20/2015 15:21 12/20/2015 20:28 12/20/2015 23:37 12/21/2015 03:45 12/21/2015 08:13  Glucose-Capillary Latest Ref Range: 65-99 mg/dL 409185 (H) 811275 (H) 914257 (H) 310 (H) 159 (H) 178 (H) 172 (H) 221 (H) 234 (H) 236 (H)   Review of Glycemic Control  Current orders for Inpatient glycemic control: Lantus 10 units QHs, Novolog 0-15 units Q4H  Inpatient Diabetes Program Recommendations: Insulin - Basal: Please consider increasing Lantus to 20 units QHS.  Thanks, Orlando PennerMarie Aniel Hubble, RN, MSN, CDE Diabetes Coordinator Inpatient Diabetes Program [phone] (Team Pager from 8am to 5pm) [phone] ([facility] office) [phone] [facility] office) [phone] [facility] office)

## 2015-12-21 NOTE — Progress Notes (Signed)
PT Cancellation Note  Patient Details Name: William [name] Makara MRN: [id number] DOB: 1955-12-25   Cancelled Treatment:    Reason Eval/Treat Not Completed: Patient not medically ready; patient sedated on vent.  Will attempt again another day.   Elray McgregorCynthia Renny Remer 12/21/2015, 11:33 AM  Sheran Lawlessyndi Masyn Rostro, PT [phone] 12/21/2015

## 2015-12-21 NOTE — Progress Notes (Signed)
Patient ID: William [name], male   DOB: Jul 25, 1956, 60 y.o.   MRN: [id number] Stable, sedated. hemovac working. Spoke with CCM . Plan to extubate him in am

## 2015-12-22 ENCOUNTER — Inpatient Hospital Stay (HOSPITAL_COMMUNITY): Payer: Medicaid Other

## 2015-12-22 DIAGNOSIS — J9601 Acute respiratory failure with hypoxia: Secondary | ICD-10-CM

## 2015-12-22 LAB — GLUCOSE, CAPILLARY
GLUCOSE-CAPILLARY: 162 mg/dL — AB (ref 65–99)
GLUCOSE-CAPILLARY: 164 mg/dL — AB (ref 65–99)
GLUCOSE-CAPILLARY: 175 mg/dL — AB (ref 65–99)
GLUCOSE-CAPILLARY: 202 mg/dL — AB (ref 65–99)
GLUCOSE-CAPILLARY: 234 mg/dL — AB (ref 65–99)
Glucose-Capillary: 205 mg/dL — ABNORMAL HIGH (ref 65–99)

## 2015-12-22 LAB — BASIC METABOLIC PANEL
ANION GAP: 8 (ref 5–15)
BUN: 15 mg/dL (ref 6–20)
CHLORIDE: 104 mmol/L (ref 101–111)
CO2: 27 mmol/L (ref 22–32)
CREATININE: 0.84 mg/dL (ref 0.61–1.24)
Calcium: 8.3 mg/dL — ABNORMAL LOW (ref 8.9–10.3)
GFR calc non Af Amer: >60 mL/min (ref >60)
Glucose, Bld: 228 mg/dL — ABNORMAL HIGH (ref 65–99)
POTASSIUM: 3.6 mmol/L (ref 3.5–5.1)
SODIUM: 139 mmol/L (ref 135–145)

## 2015-12-22 LAB — WOUND CULTURE: Culture: NO GROWTH

## 2015-12-22 LAB — CBC WITH DIFFERENTIAL/PLATELET
Basophils Absolute: 0 10*3/uL (ref 0.0–0.1)
Basophils Relative: 0 %
EOS ABS: 0.2 10*3/uL (ref 0.0–0.7)
Eosinophils Relative: 3 %
HEMATOCRIT: 35.1 % — AB (ref 39.0–52.0)
HEMOGLOBIN: 11.1 g/dL — AB (ref 13.0–17.0)
LYMPHS ABS: 2.9 10*3/uL (ref 0.7–4.0)
LYMPHS PCT: 48 %
MCH: 28.3 pg (ref 26.0–34.0)
MCHC: 31.6 g/dL (ref 30.0–36.0)
MCV: 89.5 fL (ref 78.0–100.0)
Monocytes Absolute: 0.5 10*3/uL (ref 0.1–1.0)
Monocytes Relative: 9 %
NEUTROS ABS: 2.5 10*3/uL (ref 1.7–7.7)
NEUTROS PCT: 40 %
Platelets: 134 10*3/uL — ABNORMAL LOW (ref 150–400)
RBC: 3.92 MIL/uL — AB (ref 4.22–5.81)
RDW: 14.8 % (ref 11.5–15.5)
WBC: 6.1 10*3/uL (ref 4.0–10.5)

## 2015-12-22 MED ORDER — VITAL HIGH PROTEIN PO LIQD: 1000.0000 mL | ORAL | Status: DC

## 2015-12-22 MED ORDER — ONDANSETRON HCL 4 MG/2ML IJ SOLN
4.0000 mg | Freq: Four times a day (QID) | INTRAMUSCULAR | Status: DC | PRN
  Administered 2015-12-22 – 2015-12-23 (×2): 4 mg via INTRAVENOUS
  Filled 2015-12-22 (×2): qty 2

## 2015-12-22 MED ORDER — POTASSIUM CHLORIDE 20 MEQ/15ML (10%) PO SOLN
20.0000 meq | ORAL | Status: AC
  Administered 2015-12-22 (×2): 20 meq
  Filled 2015-12-22 (×2): qty 15

## 2015-12-22 MED ORDER — METOPROLOL TARTRATE 1 MG/ML IV SOLN
2.5000 mg | Freq: Four times a day (QID) | INTRAVENOUS | Status: DC
  Administered 2015-12-22 – 2015-12-24 (×3): 2.5 mg via INTRAVENOUS
  Filled 2015-12-22 (×9): qty 5

## 2015-12-22 NOTE — Progress Notes (Signed)
Patient vomiting. NP Minor notified. Orders to stop tube feeds, place OG on low intermittent sx, and zofran were followed through.  Will continue to monitor.

## 2015-12-22 NOTE — Progress Notes (Signed)
[facility] FOR AM LAB REPLACEMENT ONLY  The patient does apply [facility] the [facility] based on the criteria listed below:   1. Is GFR >/= 40 ml/min? Yes.    Patient's GFR today is >60 2. Is urine output >/= 0.5 ml/kg/hr for the last 6 hours? Yes.   Patient's UOP is 0.6 ml/kg/hr 3. Is BUN < 60 mg/dL? Yes.    Patient's BUN today is 15 4. Abnormal electrolyte  K 3.6 5. Ordered repletion with: per protocol  Ardelle ParkSuits, William [name] 12/22/2015 4:45 AM

## 2015-12-22 NOTE — Progress Notes (Signed)
Patient's blood pressures have been increasing since propofol wast stopped this AM. NP Minor was notified.  Will continue to monitor patient.

## 2015-12-22 NOTE — Progress Notes (Signed)
Patient ID: William [name], male   DOB: 1956-02-02, 60 y.o.   MRN: [id number] Subjective:  It is alert and pleasant. He is in no apparent distress. He nods appropriately.  Objective: Vital signs in last 24 hours: Temp:  [98.6 F (37 C)-100.2 F (37.9 C)] 100.2 F (37.9 C) (03/18 0810) Pulse Rate:  [85-108] 103 (03/18 0757) Resp:  [0-22] 21 (03/18 0757) BP: (92-145)/(65-95) 113/83 mmHg (03/18 0757) SpO2:  [94 %-100 %] 96 % (03/18 0757) FiO2 (%):  [40 %] 40 % (03/18 0757) Weight:  [112.2 kg (247 lb 5.7 oz)] 112.2 kg (247 lb 5.7 oz) (03/18 0400)  Intake/Output from previous day: 03/17 0701 - 03/18 0700 In: 3204.4 [I.V.:2329.4; NG/GT:325; IV Piggyback:550] Out: 1210 [Urine:1210] Intake/Output this shift:    Physical exam the patient is alert. Glasgow Coma Scale 11 intubated. He is moving all 4 extremities well. His wound is mildly erythrematous and swollen. There is no significant midline shift. I remove the Hemovac drain  Lab Results:  Recent Labs  12/21/15 0400 12/22/15 0233  WBC 6.6 6.1  HGB 11.5* 11.1*  HCT 34.5* 35.1*  PLT 128* 134*   BMET  Recent Labs  12/21/15 0400 12/22/15 0233  NA 137 139  K 4.0 3.6  CL 102 104  CO2 26 27  GLUCOSE 266* 228*  BUN 10 15  CREATININE 0.85 0.84  CALCIUM 8.1* 8.3*    Studies/Results: Dg Chest Port 1 View  12/21/2015  CLINICAL DATA:  Back abscess, respiratory failure, ventilatory support EXAM: PORTABLE CHEST 1 VIEW COMPARISON:  12/20/2015 FINDINGS: Endotracheal tube 3.4 cm above the carina. NG tube has been advanced and is within the stomach. Postop changes of the thoracic inlet with overlying staples. Low lung volumes evident with minor bibasilar atelectasis. No focal pneumonia, collapse or consolidation. No enlarging effusion or pneumothorax. IMPRESSION: Persistent low lung volumes with bibasilar atelectasis. NG tube now within the stomach. Stable endotracheal tube position. Electronically Signed   By: Judie PetitM.  Shick M.D.   On:  12/21/2015 07:50   Dg Chest Portable 1 View  12/20/2015  CLINICAL DATA:  Intubation EXAM: PORTABLE CHEST 1 VIEW COMPARISON:  12/20/2015 FINDINGS: Endotracheal tube in good position. NG tube in the thoracic esophagus at approximately T4 level. Bibasilar atelectasis with hypo ventilation of the lungs. Negative for heart failure or effusion IMPRESSION: Endotracheal tube in good position.  Bibasilar atelectasis. NG tube in the esophagus pump approximately T4 level. Electronically Signed   By: Marlan Palauharles  Clark M.D.   On: 12/20/2015 13:11   Dg Abd Portable 1v  12/20/2015  CLINICAL DATA:  Evaluate OG tube placement. EXAM: PORTABLE ABDOMEN - 1 VIEW COMPARISON:  None. FINDINGS: The OG tube terminates in the left upper quadrant with the side port and tip below the GE junction. Mild fecal loading in the right colon. IMPRESSION: The OG tube is in good position. Electronically Signed   By: Gerome Samavid  Williams III M.D   On: 12/20/2015 16:47    Assessment/Plan: Cervical hematoma: The patient is doing well neurologically. I appreciate critical care medicine's management of his ventilator. I'll leave it up to them whether to extubate him or not.  LOS: 2 days     Nimrit Kehres D 12/22/2015, 8:42 AM

## 2015-12-22 NOTE — Progress Notes (Signed)
PULMONARY / CRITICAL CARE MEDICINE   Name:William [name] DOB:25-May-1956   ADMISSION DATE: 12/20/2015 CONSULTATION DATE: 3/16  REFERRING MD: Jeral FruitBotero   CHIEF COMPLAINT: Airway compromise   HISTORY OF PRESENT ILLNESS:  60yo male smoker with hx HTN, DM, Hep C, spinal stenosis s/p anterior cervical fusion on 3/1. Returned on 3/16 with c/o swelling, dysphagia, fever. CT neck in ER revealed abscess and he was taken urgently to OR and remains intubated post op for airway protection. PCCM consulted for vent management.   SUBJECTIVE: remains on vent, calm, TF on going  VITAL SIGNS: BP 160/108 mmHg  Pulse 107  Temp(Src) 99.7 F (37.6 C) (Oral)  Resp 14  Ht 5\' 10"  (1.778 m)  Wt 247 lb 5.7 oz (112.2 kg)  BMI 35.49 kg/m2  SpO2 99%  HEMODYNAMICS:    VENTILATOR SETTINGS: Vent Mode:  [-] CPAP;PSV FiO2 (%):  [40 %] 40 % Set Rate:  [16 bmp] 16 bmp Vt Set:  [550 mL] 550 mL PEEP:  [5 cmH20] 5 cmH20 Pressure Support:  [5 cmH20] 5 cmH20 Plateau Pressure:  [18 cmH20-20 cmH20] 20 cmH20  INTAKE / OUTPUT: I/O last 3 completed shifts: In: 5196.9 [I.V.:3961.9; NG/GT:385; IV Piggyback:850] Out: 2070 [Urine:2070]   PHYSICAL EXAMINATION: General: wdwn male, NAD sedated on vent  Neuro: Sedated, RASS -2 HEENT: Mm moist, ETT, anterior neck dressing c/d, JP drain serosang, vac, airway central Cardiovascular: s1s2 rrr Lungs: CTA reduced Abdomen: Round, mildly distended , vomited x 1 Musculoskeletal: Warm and dry, no edema   LABS:  BMET  Recent Labs Lab 12/17/15 1446 12/20/15 0551 12/21/15 0400 12/22/15 0233  NA 137 137 137 139  K 3.8 3.6 4.0 3.6  CL 102 95* 102 104  CO2 28  --  26 27  BUN 7 8 10 15   CREATININE 0.69 0.60* 0.85 0.84  GLUCOSE 150* 182* 266* 228*    Electrolytes  Recent Labs Lab 12/17/15 1446 12/21/15 0400 12/22/15 0233  CALCIUM 9.0 8.1* 8.3*  MG  --  1.6*  --   PHOS  --  3.2  --     CBC  Recent Labs Lab  12/20/15 0515 12/20/15 0551 12/21/15 0400 12/22/15 0233  WBC 11.7*  --  6.6 6.1  HGB 14.0 15.3 11.5* 11.1*  HCT 40.9 45.0 34.5* 35.1*  PLT 167  --  128* 134*    Coag's No results for input(s): APTT, INR in the last 168 hours.  Sepsis Markers  Recent Labs Lab 12/20/15 1258 12/20/15 1420  LATICACIDVEN 1.4 1.5    ABG  Recent Labs Lab 12/20/15 1335  PHART 7.332*  PCO2ART 53.5*  PO2ART 111.0*    Liver Enzymes  Recent Labs Lab 12/17/15 1446 12/21/15 0400  AST 325* 194*  ALT 430* 328*  ALKPHOS 68 64  BILITOT 0.7 0.7  ALBUMIN 3.1* 2.1*    Cardiac Enzymes No results for input(s): TROPONINI, PROBNP in the last 168 hours.  Glucose  Recent Labs Lab 12/21/15 1139 12/21/15 1520 12/21/15 1935 12/21/15 2331 12/22/15 0334 12/22/15 0812  GLUCAP 242* 206* 156* 234* 205* 162*    Imaging Dg Chest Port 1 View  12/22/2015  CLINICAL DATA:  Neck abscess EXAM: PORTABLE CHEST 1 VIEW COMPARISON:  Chest radiograph from one day prior. FINDINGS: Endotracheal tube tip is 3.3 cm above the carina. Enteric tube terminates in the gastric fundus. Skin staples overlie the lower neck. Surgical hardware from ACDF is partially visualized overlying the lower cervical spine. Stable cardiomediastinal silhouette with normal heart size. No pneumothorax.  No pleural effusion. Low lung volumes. No pulmonary edema. Curvilinear bibasilar lung opacities, slightly worsened bilaterally. IMPRESSION: 1. Well-positioned support hardware as described. 2. Low lung volumes with worsening curvilinear bibasilar lung opacities, favor atelectasis. Electronically Signed   By: Delbert Phenix M.D.   On: 12/22/2015 09:55    STUDIES:  CT chest / soft tissue neck 03/16 > Recent ACDF with large surgical site abscess extending from skin to hardware. Abscess and related edema causes severe airway compromise. No mediastinitis.  CULTURES: Wound 03/16 >  ANTIBIOTICS: Vanc 03/16 > Zosyn 03/16 >  SIGNIFICANT  EVENTS: 03/01 > ACDF. 03/16 > back to OR for anterior cervical decompression for epidural abscess.  LINES/TUBES: ETT 03/16 > Neck hemovac 03/16 >3/17  DISCUSSION: 60 y.o. M who had ACDF on 03/01. Had C5 radiculopathy following surgery then went to rehab. Since then had swelling with difficulty swallowing, fever, erythema. He was found to have surgical site abscess; therefore, was taken back to OR for anterior cervical decompression for epidural abscess.  ASSESSMENT / PLAN:  NEUROLOGIC A:  Acute metabolic encephalopathy - due to sedation. Hx cocaine abuse, depression, arthritis, TIA. Cervical stenosis - s/p ACDF 03/01. Had surgical site abscess; therefore, was taken to OR 03/16 for I&D and anterior decompression. P:  Sedation: Propofol gtt / Fentanyl gtt, WUA mandatory RASS goal: -1 Post op care per neurosurgery, drain likely out in am  Hold outpatient cyclobenzaprine, diphenhydramine,  Re add gabapentin  PULMONARY A: VDRF - following anterior cervical decompression for epidural abscess 03/16. Tobacco use disorder. Difficult airway  ( 1 Hour per NS to obtain awake intubation in OR) P:  Vent bundle Weaning cpap 5ps5 goal extubation in 18 hours pcxr in am for atx worsening? Will need glide and bronch at bedside when extubated over weekend Get daily leak test Plan on extubation in am  CARDIOVASCULAR A:  Hx HTN, HLD. P:  Monitor hemodynamics, may need some prop redcution Antihypertensives as needed Assess lactate.  RENAL Lab Results  Component Value Date   CREATININE 0.84 12/22/2015   CREATININE 0.85 12/21/2015   CREATININE 0.60* 12/20/2015   CREATININE 1.0 03/09/2014   CREATININE 1.19 02/28/2014   CREATININE 1.07 01/14/2014   CREATININE 0.95 12/02/2013    A:  Hypocalcemia. P:  NS @ 100. 1g Ca gluconate. BMP in AM.  GASTROINTESTINAL A:  Hx HCV. GI prophylaxis. Nutrition. Obesity. Shock liver>/? In setting hep c P:  SUP:  Pantoprazole. TF stopped and ogt placed to suction Constipation, add lactulose  HEMATOLOGIC A:  VTE Prophylaxis. P:  SCD's. CBC in AM. Add sub q hep  INFECTIOUS A:  Epidural abscess - s/p I&D with anterior cervical decompression. P:  Abx as above (vanc / zosyn).  Follow culture from OR  ENDOCRINE A:  DM.  P:  SSI. Lantus 10u daily (takes 50u as outpatient). Increase to 25   Family updated: Fiance at bedside 3/18  Interdisciplinary Family Meeting v Palliative Care Meeting: Due by: 03/23.  CC time:  Brett Canales Minor ACNP Adolph Pollack PCCM Pager [phone] till 3 pm If no answer page [phone] 12/22/2015, 12:13 PM  Attending Note:  I have examined patient, reviewed labs, studies and notes. I have discussed the case with S Minor, and I agree with the data and plans as amended above. Epidural abscess with VDRF. Dr [name] removed drain today. There is some edema present. Will plan to assess for SBT, cuff leak in am 3/19 and hopefully extubate at that time. Independent critical care time is 85  minutes.   Levy Pupa, MD, PhD 12/22/2015, 6:43 PM [facility] [phone] or if no answer [phone]

## 2015-12-23 ENCOUNTER — Inpatient Hospital Stay (HOSPITAL_COMMUNITY): Payer: Medicaid Other

## 2015-12-23 DIAGNOSIS — B9689 Other specified bacterial agents as the cause of diseases classified elsewhere: Secondary | ICD-10-CM

## 2015-12-23 DIAGNOSIS — G061 Intraspinal abscess and granuloma: Secondary | ICD-10-CM

## 2015-12-23 DIAGNOSIS — Y838 Other surgical procedures as the cause of abnormal reaction of the patient, or of later complication, without mention of misadventure at the time of the procedure: Secondary | ICD-10-CM

## 2015-12-23 LAB — BASIC METABOLIC PANEL
ANION GAP: 9 (ref 5–15)
BUN: 7 mg/dL (ref 6–20)
CALCIUM: 8.4 mg/dL — AB (ref 8.9–10.3)
CO2: 28 mmol/L (ref 22–32)
CREATININE: 0.7 mg/dL (ref 0.61–1.24)
Chloride: 101 mmol/L (ref 101–111)
Glucose, Bld: 174 mg/dL — ABNORMAL HIGH (ref 65–99)
Potassium: 3.1 mmol/L — ABNORMAL LOW (ref 3.5–5.1)
SODIUM: 138 mmol/L (ref 135–145)

## 2015-12-23 LAB — GLUCOSE, CAPILLARY
GLUCOSE-CAPILLARY: 118 mg/dL — AB (ref 65–99)
GLUCOSE-CAPILLARY: 152 mg/dL — AB (ref 65–99)
GLUCOSE-CAPILLARY: 143 mg/dL — AB (ref 65–99)
GLUCOSE-CAPILLARY: 161 mg/dL — AB (ref 65–99)
Glucose-Capillary: 131 mg/dL — ABNORMAL HIGH (ref 65–99)
Glucose-Capillary: 148 mg/dL — ABNORMAL HIGH (ref 65–99)

## 2015-12-23 LAB — CBC
HEMATOCRIT: 36 % — AB (ref 39.0–52.0)
Hemoglobin: 11.6 g/dL — ABNORMAL LOW (ref 13.0–17.0)
MCH: 28 pg (ref 26.0–34.0)
MCHC: 32.2 g/dL (ref 30.0–36.0)
MCV: 87 fL (ref 78.0–100.0)
PLATELETS: 158 10*3/uL (ref 150–400)
RBC: 4.14 MIL/uL — ABNORMAL LOW (ref 4.22–5.81)
RDW: 14 % (ref 11.5–15.5)
WBC: 6.6 10*3/uL (ref 4.0–10.5)

## 2015-12-23 MED ORDER — PROMETHAZINE HCL 25 MG/ML IJ SOLN
12.5000 mg | INTRAMUSCULAR | Status: DC | PRN
  Administered 2015-12-26: 12.5 mg via INTRAVENOUS
  Filled 2015-12-23 (×2): qty 1

## 2015-12-23 MED ORDER — MORPHINE SULFATE (PF) 2 MG/ML IV SOLN
1.0000 mg | INTRAVENOUS | Status: DC | PRN
  Filled 2015-12-23: qty 1

## 2015-12-23 MED ORDER — SODIUM CHLORIDE 0.9 % IV SOLN: INTRAVENOUS | Status: DC

## 2015-12-23 MED ORDER — FENTANYL CITRATE (PF) 100 MCG/2ML IJ SOLN
25.0000 ug | INTRAMUSCULAR | Status: DC | PRN
  Administered 2015-12-23 – 2015-12-27 (×6): 25 ug via INTRAVENOUS
  Filled 2015-12-23 (×5): qty 2

## 2015-12-23 MED ORDER — POTASSIUM CHLORIDE 10 MEQ/50ML IV SOLN
10.0000 meq | INTRAVENOUS | Status: AC
  Administered 2015-12-23 (×5): 10 meq via INTRAVENOUS
  Filled 2015-12-23 (×5): qty 50

## 2015-12-23 MED ORDER — CETYLPYRIDINIUM CHLORIDE 0.05 % MT LIQD
7.0000 mL | Freq: Two times a day (BID) | OROMUCOSAL | Status: DC
  Administered 2015-12-23: 7 mL via OROMUCOSAL

## 2015-12-23 MED ORDER — FENTANYL CITRATE (PF) 100 MCG/2ML IJ SOLN
INTRAMUSCULAR | Status: AC
  Filled 2015-12-23: qty 2

## 2015-12-23 MED ORDER — SODIUM CHLORIDE 0.9% FLUSH: 3.0000 mL | INTRAVENOUS | Status: DC | PRN

## 2015-12-23 MED ORDER — SODIUM CHLORIDE 0.9 % IV SOLN: 250.0000 mL | INTRAVENOUS | Status: DC

## 2015-12-23 NOTE — Progress Notes (Signed)
PULMONARY / CRITICAL CARE MEDICINE   Name:William [name] GNF:[id number] DOB:07/13/1956   ADMISSION DATE: 12/20/2015 CONSULTATION DATE: 3/16  REFERRING MD: Jeral FruitBotero   CHIEF COMPLAINT: Airway compromise   HISTORY OF PRESENT ILLNESS:  60yo male smoker with hx HTN, DM, Hep C, spinal stenosis s/p anterior cervical fusion on 3/1. Returned on 3/16 with c/o swelling, dysphagia, fever. CT neck in ER revealed abscess and he was taken urgently to OR and remains intubated post op for airway protection. PCCM consulted for vent management.   SUBJECTIVE:  Tolerating PSV well Awake Neck drain out since yesterday  VITAL SIGNS: BP 121/87 mmHg  Pulse 89  Temp(Src) 98.5 F (36.9 C) (Oral)  Resp 15  Ht 5\' 10"  (1.778 m)  Wt 108.8 kg (239 lb 13.8 oz)  BMI 34.42 kg/m2  SpO2 98%  HEMODYNAMICS:    VENTILATOR SETTINGS: Vent Mode:  [-] CPAP;PSV FiO2 (%):  [40 %] 40 % Set Rate:  [16 bmp] 16 bmp Vt Set:  [550 mL] 550 mL PEEP:  [5 cmH20] 5 cmH20 Pressure Support:  [5 cmH20] 5 cmH20 Plateau Pressure:  [9 cmH20-18 cmH20] 18 cmH20  INTAKE / OUTPUT: I/O last 3 completed shifts: In: 4975.9 [I.V.:3608.4; NG/GT:530; IV Piggyback:837.5] Out: 4350 [Urine:3100; Emesis/NG output:1250]   PHYSICAL EXAMINATION: General: wdwn male, NAD sedated on vent  Neuro: Awake and interacting, moves all ext HEENT: Mm moist, ETT, anterior neck dressing c/d, Drain is out Cardiovascular: s1s2 rrr Lungs: CTA reduced Abdomen: Round, mildly distended, NT Musculoskeletal: Warm and dry, no edema   LABS:  BMET  Recent Labs Lab 12/21/15 0400 12/22/15 0233 12/23/15 0235  NA 137 139 138  K 4.0 3.6 3.1*  CL 102 104 101  CO2 26 27 28   BUN 10 15 7   CREATININE 0.85 0.84 0.70  GLUCOSE 266* 228* 174*    Electrolytes  Recent Labs Lab 12/21/15 0400 12/22/15 0233 12/23/15 0235  CALCIUM 8.1* 8.3* 8.4*  MG 1.6*  --   --   PHOS 3.2  --   --     CBC  Recent Labs Lab 12/21/15 0400  12/22/15 0233 12/23/15 0235  WBC 6.6 6.1 6.6  HGB 11.5* 11.1* 11.6*  HCT 34.5* 35.1* 36.0*  PLT 128* 134* 158    Coag's No results for input(s): APTT, INR in the last 168 hours.  Sepsis Markers  Recent Labs Lab 12/20/15 1258 12/20/15 1420  LATICACIDVEN 1.4 1.5    ABG  Recent Labs Lab 12/20/15 1335  PHART 7.332*  PCO2ART 53.5*  PO2ART 111.0*    Liver Enzymes  Recent Labs Lab 12/17/15 1446 12/21/15 0400  AST 325* 194*  ALT 430* 328*  ALKPHOS 68 64  BILITOT 0.7 0.7  ALBUMIN 3.1* 2.1*    Cardiac Enzymes No results for input(s): TROPONINI, PROBNP in the last 168 hours.  Glucose  Recent Labs Lab 12/22/15 0812 12/22/15 1149 12/22/15 1559 12/22/15 1947 12/23/15 0033 12/23/15 0433  GLUCAP 162* 202* 175* 164* 148* 131*    Imaging Dg Chest Port 1 View  12/23/2015  CLINICAL DATA:  Respiratory failure EXAM: PORTABLE CHEST 1 VIEW COMPARISON:  Chest radiograph from one day prior. FINDINGS: Endotracheal tube tip is 3.5 cm above the carina. Enteric tube enters the stomach with the tip not seen on this image. Surgical hardware from ACDF and skin staples are partially visualized overlying the lower cervical spine. Stable cardiomediastinal silhouette with top-normal heart size. No pneumothorax. No pleural effusion. Patchy left basilar lung opacity appears stable. Decreased right basilar atelectasis. No pulmonary  edema. IMPRESSION: 1. Well-positioned support hardware as described . 2. Decreased right basilar atelectasis. Stable patchy left basilar lung opacities. Electronically Signed   By: Delbert Phenix M.D.   On: 12/23/2015 07:37    STUDIES:  CT chest / soft tissue neck 03/16 > Recent ACDF with large surgical site abscess extending from skin to hardware. Abscess and related edema causes severe airway compromise. No mediastinitis.  CULTURES: Wound 03/16 >  ANTIBIOTICS: Vanc 03/16 > Zosyn 03/16 >  SIGNIFICANT EVENTS: 03/01 > ACDF. 03/16 > back to OR for  anterior cervical decompression for epidural abscess.  LINES/TUBES: ETT 03/16 > Neck hemovac 03/16 >3/17  DISCUSSION: 60 y.o. M who had ACDF on 03/01. Had C5 radiculopathy following surgery then went to rehab. Since then had swelling with difficulty swallowing, fever, erythema. He was found to have surgical site abscess; therefore, was taken back to OR for anterior cervical decompression for epidural abscess.  ASSESSMENT / PLAN:  NEUROLOGIC A:  Acute metabolic encephalopathy - due to sedation. Hx cocaine abuse, depression, arthritis, TIA. Cervical stenosis - s/p ACDF 03/01. Had surgical site abscess; therefore, was taken to OR 03/16 for I&D and anterior decompression. P:  Sedation: Propofol gtt / Fentanyl gtt, weaning for goal extubation 3/19 RASS goal: 0 Post op care per neurosurgery Hold outpatient cyclobenzaprine, diphenhydramine,  Continue gabapentin  PULMONARY A: VDRF - following anterior cervical decompression for epidural abscess 03/16. Tobacco use disorder. Difficult airway  ( 1 Hour per NS to obtain awake intubation in OR) P:  Plan extubation this am; good cuff leak  Will need glide and bronch at bedside when extubated  CARDIOVASCULAR A:  Hx HTN, HLD. P:  Monitor hemodynamics Antihypertensives as needed  RENAL Lab Results  Component Value Date   CREATININE 0.70 12/23/2015   CREATININE 0.84 12/22/2015   CREATININE 0.85 12/21/2015   CREATININE 1.0 03/09/2014   CREATININE 1.19 02/28/2014   CREATININE 1.07 01/14/2014   CREATININE 0.95 12/02/2013    A:  Hypocalcemia. P:  NS to KVO Follow BMP  GASTROINTESTINAL A:  Hx HCV. GI prophylaxis. Nutrition. Obesity. Shock liver>/? In setting hep c P:  SUP: Pantoprazole. TF stopped and ogt placed to suction 3/18 for emesis Constipation, add lactulose  HEMATOLOGIC A:  VTE Prophylaxis. P:  SCD's. CBC in AM. Add sub q hep  INFECTIOUS A:  Epidural abscess - s/p I&D with  anterior cervical decompression. P:  Abx as above (vanc / zosyn).  Follow culture from OR (negative so far)   ENDOCRINE A:  DM.  P:  SSI. Lantus 10u daily (takes 50u as outpatient). Increase to 25   Family updated: Fiance at bedside 3/18  Interdisciplinary Family Meeting v Palliative Care Meeting: Due by: 03/23.    Independent CC time 40 minutes   Levy Pupa, MD, PhD 12/23/2015, 8:24 AM [facility] [phone] or if no answer [phone]

## 2015-12-23 NOTE — Progress Notes (Signed)
    [facility]   Reason for visit: Follow up on neck abscess  Interval History: remains afebrile. cx remains NGTD. extubated today. He reports dry throat  Physical Exam: Constitutional:  Filed Vitals:   12/23/15 1015 12/23/15 1100  BP: 117/72 132/84  Pulse: 101 95  Temp:    Resp: 20 14  gen = alert, follows commands, HEENT = dry oral pharynx Neck: still wearing soft collar Respiratory: CTA B, Cardiovascular: RRR Skin = dry  Lab Results  Component Value Date   WBC 6.6 12/23/2015   HGB 11.6* 12/23/2015   HCT 36.0* 12/23/2015   MCV 87.0 12/23/2015   PLT 158 12/23/2015    Lab Results  Component Value Date   CREATININE 0.70 12/23/2015   BUN 7 12/23/2015   NA 138 12/23/2015   K 3.1* 12/23/2015   CL 101 12/23/2015   CO2 28 12/23/2015    Lab Results  Component Value Date   ALT 328* 12/21/2015   AST 194* 12/21/2015   ALKPHOS 64 12/21/2015     Microbiology: Recent Results (from the past 240 hour(s))  Anaerobic culture     Status: None (Preliminary result)   Collection Time: 12/20/15 10:20 AM  Result Value Ref Range Status   Specimen Description WOUND  Final   Special Requests CERVICAL WOUND  Final   Gram Stain PENDING  Incomplete   Culture   Final    NO ANAEROBES ISOLATED; CULTURE IN PROGRESS FOR 5 DAYS Performed at [facility]    Report Status PENDING  Incomplete  Wound culture     Status: None   Collection Time: 12/20/15 10:20 AM  Result Value Ref Range Status   Specimen Description WOUND  Final   Special Requests CERVICAL WOUND  Final   Gram Stain   Final    MODERATE WBC PRESENT,BOTH PMN AND MONONUCLEAR NO SQUAMOUS EPITHELIAL CELLS SEEN NO ORGANISMS SEEN Performed at [facility]    Culture   Final    NO GROWTH 2 DAYS Performed at [facility]    Report Status 12/22/2015 FINAL  Final  MRSA PCR Screening     Status: None   Collection Time: 12/20/15 12:44 PM  Result Value Ref Range Status   MRSA  by PCR NEGATIVE NEGATIVE Final    Comment:        The GeneXpert MRSA Assay (FDA approved for NASAL specimens only), is one component of a comprehensive MRSA colonization surveillance program. It is not intended to diagnose MRSA infection nor to guide or monitor treatment for MRSA infections.     Impression/Plan:  1. Post op infection - no growth to date on culture.  Continue on vancomycin and piptazo. Plan to get picc line for IV abtx 2. Abx - creat stable on vancomycin

## 2015-12-23 NOTE — Progress Notes (Signed)
Rehab Admissions Coordinator Note:  Patient was screened by Trish MageLogue, Diedre Maclellan M for appropriateness for an Inpatient Acute Rehab Consult.  At this time, we are recommending Inpatient Rehab consult.  Trish MageLogue, Yosgar Demirjian M 12/23/2015, 6:06 PM  I can be reached at [phone].

## 2015-12-23 NOTE — Progress Notes (Signed)
Pharmacy Antibiotic Note  William [name] is a 60 y.o. male admitted on 12/20/2015 with abscess.  Pharmacy has been consulted for vancomycin/zosyn dosing.  Plan: Vancomycin 1 IV every 12 hours.  Goal trough 15-20 mcg/mL.  Zosyn 3.375 gm IV q8h  Height: 5\' 10"  (177.8 cm) Weight: 239 lb 13.8 oz (108.8 kg) IBW/kg (Calculated) : 73  Temp (24hrs), Avg:98.7 F (37.1 C), Min:98.1 F (36.7 C), Max:99.7 F (37.6 C)   Recent Labs Lab 12/17/15 1446 12/20/15 0515 12/20/15 0551 12/20/15 1258 12/20/15 1420 12/21/15 0400 12/22/15 0233 12/23/15 0235  WBC 8.3 11.7*  --   --   --  6.6 6.1 6.6  CREATININE 0.69  --  0.60*  --   --  0.85 0.84 0.70  LATICACIDVEN  --   --   --  1.4 1.5  --   --   --     Estimated Creatinine Clearance: 121.3 mL/min (by C-G formula based on Cr of 0.7).    Allergies  Allergen Reactions  . Metformin And Related Other (See Comments)    Possible liver inflammation  . Prednisone Other (See Comments)    Disoriented   . Tamsulosin Other (See Comments)    Nose bleed  . Other Other (See Comments)    Reports all narcotics make him sick. Pt premeds with Reglan to stop nausea    Antimicrobials this admission: Vancomycin 3/16>> Zosyn 3/16>>  Dose adjustments this admission: None  Microbiology results: 3/16 Neck Wound>>NGTD  William [name], PharmD, BCPS, [facility] Clinical Pharmacist Pager [phone] 12/23/2015 10:54 AM

## 2015-12-23 NOTE — Progress Notes (Signed)
Patient ID: William [name], male   DOB: May 01, 1956, 60 y.o.   MRN: [id number] Subjective:  The patient is alert and nods appropriately. He is in no apparent distress.  Objective: Vital signs in last 24 hours: Temp:  [98.1 F (36.7 C)-99.7 F (37.6 C)] 98.5 F (36.9 C) (03/19 0809) Pulse Rate:  [80-119] 96 (03/19 0900) Resp:  [0-30] 16 (03/19 0900) BP: (81-190)/(59-125) 139/93 mmHg (03/19 0900) SpO2:  [97 %-100 %] 98 % (03/19 0900) FiO2 (%):  [40 %] 40 % (03/19 0730) Weight:  [108.8 kg (239 lb 13.8 oz)] 108.8 kg (239 lb 13.8 oz) (03/19 0500)  Intake/Output from previous day: 03/18 0701 - 03/19 0700 In: 2782.5 [I.V.:1956; NG/GT:290; IV Piggyback:537.5] Out: 3765 [Urine:2515; Emesis/NG output:1250] Intake/Output this shift:    Physical exam the patient is alert and pleasant. Glasgow Coma Scale 11 intubated. He is moving all 4 extremities well. His wound is healing well with mild swelling.  Lab Results:  Recent Labs  12/22/15 0233 12/23/15 0235  WBC 6.1 6.6  HGB 11.1* 11.6*  HCT 35.1* 36.0*  PLT 134* 158   BMET  Recent Labs  12/22/15 0233 12/23/15 0235  NA 139 138  K 3.6 3.1*  CL 104 101  CO2 27 28  GLUCOSE 228* 174*  BUN 15 7  CREATININE 0.84 0.70  CALCIUM 8.3* 8.4*    Studies/Results: Dg Chest Port 1 View  12/23/2015  CLINICAL DATA:  Respiratory failure EXAM: PORTABLE CHEST 1 VIEW COMPARISON:  Chest radiograph from one day prior. FINDINGS: Endotracheal tube tip is 3.5 cm above the carina. Enteric tube enters the stomach with the tip not seen on this image. Surgical hardware from ACDF and skin staples are partially visualized overlying the lower cervical spine. Stable cardiomediastinal silhouette with top-normal heart size. No pneumothorax. No pleural effusion. Patchy left basilar lung opacity appears stable. Decreased right basilar atelectasis. No pulmonary edema. IMPRESSION: 1. Well-positioned support hardware as described . 2. Decreased right basilar atelectasis.  Stable patchy left basilar lung opacities. Electronically Signed   By: Delbert PhenixJason A Poff M.D.   On: 12/23/2015 07:37   Dg Chest Port 1 View  12/22/2015  CLINICAL DATA:  Neck abscess EXAM: PORTABLE CHEST 1 VIEW COMPARISON:  Chest radiograph from one day prior. FINDINGS: Endotracheal tube tip is 3.3 cm above the carina. Enteric tube terminates in the gastric fundus. Skin staples overlie the lower neck. Surgical hardware from ACDF is partially visualized overlying the lower cervical spine. Stable cardiomediastinal silhouette with normal heart size. No pneumothorax. No pleural effusion. Low lung volumes. No pulmonary edema. Curvilinear bibasilar lung opacities, slightly worsened bilaterally. IMPRESSION: 1. Well-positioned support hardware as described. 2. Low lung volumes with worsening curvilinear bibasilar lung opacities, favor atelectasis. Electronically Signed   By: Delbert PhenixJason A Poff M.D.   On: 12/22/2015 09:55    Assessment/Plan: Status post evacuation of cervical hematoma: The patient seems to be making progress. I am told the plan is to extubate him today. So far the cultures have been negative.  LOS: 3 days     Larico Dimock D 12/23/2015, 9:05 AM

## 2015-12-23 NOTE — Progress Notes (Signed)
This rn arrived to main pharmacy to return unspiked fentanyl drip bag 2,500 mcg/250 ml concentration, [facility]

## 2015-12-23 NOTE — Progress Notes (Signed)
Wasted 250cc of Fentanyl in sink with Edweana Maylon Peppers(Katrice) Rulon EisenmengerKeller RN.

## 2015-12-23 NOTE — Procedures (Signed)
Extubation Procedure Note  Patient Details:   Name: Jill SideWarren Fann DOB: 08-Jan-1956 MRN: [id number]   Airway Documentation:     Evaluation  O2 sats: stable throughout Complications: No apparent complications Patient did tolerate procedure well. Bilateral Breath Sounds: Clear   Yes  Pt extubated to a 4lpm    Melanee Spryelson, Tashawn Greff Lawson 12/23/2015, 8:34 AM

## 2015-12-23 NOTE — Progress Notes (Signed)
Orthopedic Tech Progress Note Patient Details:  William [name] 20-Jun-1956 [id number]  Ortho Devices Type of Ortho Device: Soft collar Ortho Device/Splint Location: neck Ortho Device/Splint Interventions: Application   Jalise Zawistowski 12/23/2015, 11:51 AM

## 2015-12-23 NOTE — Evaluation (Addendum)
Occupational Therapy Evaluation Patient Details Name: William [name] MRN: [id number] DOB: 10-22-1955 Today's Date: 12/23/2015    History of Present Illness Mr Toops on 12/05/15 had a 4 level acdf. Woke up with c5 radiculopathy. Saw rehab medicine. Since then he has been complaining of swelling, unable to swallow,fever, redness of neck area and chest. Seen in the er , a ct neck showed most likely an abscess 3/16 pt underwent cervical exploration with removal of hematoma and cleaning out of abcess.   Clinical Impression   [name] underwent above presents to acute OT with deficits below affecting his ability to perform any and all of his basic ADLs. He will benefit from acute OT with follow up OT on CIR to get strength and use back in his arms.    Follow Up Recommendations  CIR    Equipment Recommendations   (needs a new shower seat ([employer]) or tub bench (the one he got does not fit in his tub))       Precautions / Restrictions Precautions Precautions: Cervical Required Braces or Orthoses: Cervical Brace Cervical Brace: Soft collar Restrictions Weight Bearing Restrictions: No      Mobility Bed Mobility Overal bed mobility: Needs Assistance Bed Mobility: Supine to Sit     Supine to sit: Min guard     General bed mobility comments: raised straight up into long sitting to then move legs off of bed   Transfers Overall transfer level: Needs assistance Equipment used: 1 person hand held assist Transfers: Sit to/from [employer] Sit to Stand: Min assist Stand pivot transfers: Min assist                 ADL Overall ADL's : Needs assistance/impaired                                       General ADL Comments: Pt currently total A for all basic ADLs due to decreased use of his arms, sit<>stand min A               Pertinent Vitals/Pain Pain Assessment: No/denies pain     Hand Dominance Right   Extremity/Trunk Assessment  Upper Extremity Assessment Upper Extremity Assessment: RUE deficits/detail;LUE deficits/detail RUE Deficits / Details: Pt able to elevate shoulder against gravity. Limited elbow and shoulder flexion against gravity. Full activeextension, wrist and hand WFL. PROM overall WFL. Good grip strength. Strength 2-/5 shoulder and elbow flexion/ext, elbow flex/ext. No changes in sensation reported. Fine motor coordination WFL. RUE Coordination: decreased gross motor LUE Deficits / Details: Pt able to elevate shoulder against gravity. Cannot flex elbow or shoulder against gravity without total A with tendency to shoulder elevate to try to achieve these movements. Full active extension for shoulder and elbow extension when weight of arm held. Decreased forearm supinatin, full AROM for pronation. WFL wrist flexion/extension and grip./ext, elbow flex/ext. LUE Coordination: decreased gross motor   Lower Extremity Assessment Lower Extremity Assessment: Defer to PT evaluation       Communication Communication Communication: No difficulties   Cognition Arousal/Alertness: Awake/alert Behavior During Therapy: WFL for tasks assessed/performed Overall Cognitive Status: Within Functional Limits for tasks assessed                        Exercises   Other Exercises Other Exercises: Had pt perform 5 reps of AAROM while seated in recliner shoulder flexion/extension;  elbow flexion/extension; forearm supination; AROM forearm pronation, wrist flexion/extension; composite flexion/extension Other Exercises: Had pt perform 5 reps of AAROM while seated in recliner shoulder flexion/extension; elbow flexion/extension; forearm supination; AROM forearm pronation, wrist flexion/extension; composite flexion/extension. Educated pt on doing forearm,wrist, and hand exercises on his own--he verbally agreed        Home Living Family/patient expects to be discharged to:: Inpatient rehab Living Arrangements:  Spouse/significant other Available Help at Discharge: Family;Available 24 hours/day Type of Home: Apartment Home Access: Level entry     Home Layout: One level     Bathroom Shower/Tub: Tub/shower unit Shower/tub characteristics: Engineer, building servicesCurtain Bathroom Toilet: Standard     Home Equipment: Crutches;Bedside commode;Shower seat (shower seat does not fit his tub)          Prior Functioning/Environment Level of Independence: Needs assistance  Gait / Transfers Assistance Needed: Pt has been independent with basic ambulation ADL's / Homemaking Assistance Needed: Since sx on 12/05/2015 pt has been totally dependent with basic ADLs and IADLs   Comments: Per pt report was on disability for Lt knee arthritis and previously because of brain tumor (from chart 2 weeks ago)    OT Diagnosis: Generalized weakness   OT Problem List: Decreased strength;Decreased range of motion;Impaired balance (sitting and/or standing);Impaired UE functional use;Impaired tone   OT Treatment/Interventions: Self-care/ADL training;Therapeutic exercise;Patient/family education;Balance training;Therapeutic activities;DME and/or AE instruction;Neuromuscular education    OT Goals(Current goals can be found in the care plan section) Acute Rehab OT Goals Patient Stated Goal: to be able to use my arms my wife has had to do everything for me OT Goal Formulation: With patient/family Time For Goal Achievement: 12/30/15 Potential to Achieve Goals: Good  OT Frequency: Min 3X/week              End of Session Equipment Utilized During Treatment: Cervical collar Nurse Communication: Mobility status  Activity Tolerance: Patient tolerated treatment well Patient left: in chair;with call bell/phone within reach;with chair alarm set   Time: 1512-1550 OT Time Calculation (min): 38 min Charges:  OT Evaluation $OT Eval Moderate Complexity: 1 Procedure OT Treatments $Therapeutic Exercise: 23-37 mins   Evette GeorgesLeonard, Eliyanna Ault Eva  [phone] 12/23/2015, 4:26 PM

## 2015-12-24 DIAGNOSIS — Z794 Long term (current) use of insulin: Secondary | ICD-10-CM

## 2015-12-24 DIAGNOSIS — B182 Chronic viral hepatitis C: Secondary | ICD-10-CM

## 2015-12-24 DIAGNOSIS — Z9889 Other specified postprocedural states: Secondary | ICD-10-CM

## 2015-12-24 DIAGNOSIS — R000 Tachycardia, unspecified: Secondary | ICD-10-CM | POA: Insufficient documentation

## 2015-12-24 DIAGNOSIS — E1165 Type 2 diabetes mellitus with hyperglycemia: Secondary | ICD-10-CM

## 2015-12-24 DIAGNOSIS — I10 Essential (primary) hypertension: Secondary | ICD-10-CM

## 2015-12-24 DIAGNOSIS — IMO0002 Reserved for concepts with insufficient information to code with codable children: Secondary | ICD-10-CM | POA: Insufficient documentation

## 2015-12-24 DIAGNOSIS — E118 Type 2 diabetes mellitus with unspecified complications: Secondary | ICD-10-CM

## 2015-12-24 DIAGNOSIS — E876 Hypokalemia: Secondary | ICD-10-CM | POA: Insufficient documentation

## 2015-12-24 DIAGNOSIS — J9601 Acute respiratory failure with hypoxia: Secondary | ICD-10-CM

## 2015-12-24 DIAGNOSIS — R0682 Tachypnea, not elsewhere classified: Secondary | ICD-10-CM | POA: Insufficient documentation

## 2015-12-24 LAB — GLUCOSE, CAPILLARY
GLUCOSE-CAPILLARY: 153 mg/dL — AB (ref 65–99)
GLUCOSE-CAPILLARY: 265 mg/dL — AB (ref 65–99)
Glucose-Capillary: 109 mg/dL — ABNORMAL HIGH (ref 65–99)
Glucose-Capillary: 118 mg/dL — ABNORMAL HIGH (ref 65–99)
Glucose-Capillary: 254 mg/dL — ABNORMAL HIGH (ref 65–99)
Glucose-Capillary: 178 mg/dL — ABNORMAL HIGH (ref 65–99)

## 2015-12-24 LAB — BASIC METABOLIC PANEL
Anion gap: 9 (ref 5–15)
BUN: <5 mg/dL — ABNORMAL LOW (ref 6–20)
CO2: 28 mmol/L (ref 22–32)
Calcium: 8.8 mg/dL — ABNORMAL LOW (ref 8.9–10.3)
Chloride: 100 mmol/L — ABNORMAL LOW (ref 101–111)
Creatinine, Ser: 0.63 mg/dL (ref 0.61–1.24)
GFR calc Af Amer: >60 mL/min (ref >60)
GFR calc non Af Amer: >60 mL/min (ref >60)
Glucose, Bld: 129 mg/dL — ABNORMAL HIGH (ref 65–99)
Potassium: 3.4 mmol/L — ABNORMAL LOW (ref 3.5–5.1)
Sodium: 137 mmol/L (ref 135–145)

## 2015-12-24 LAB — VANCOMYCIN, TROUGH: Vancomycin Tr: 7 ug/mL — ABNORMAL LOW (ref 10.0–20.0)

## 2015-12-24 MED ORDER — VANCOMYCIN HCL IN DEXTROSE 1-5 GM/200ML-% IV SOLN
1000.0000 mg | Freq: Three times a day (TID) | INTRAVENOUS | Status: DC
  Administered 2015-12-24 – 2015-12-27 (×9): 1000 mg via INTRAVENOUS
  Filled 2015-12-24 (×12): qty 200

## 2015-12-24 MED ORDER — HYDROMORPHONE HCL 1 MG/ML IJ SOLN: 0.2500 mg | INTRAMUSCULAR | Status: DC | PRN

## 2015-12-24 MED ORDER — DEXTROSE 5 % IV SOLN
2.0000 g | INTRAVENOUS | Status: DC
  Administered 2015-12-24 – 2015-12-27 (×4): 2 g via INTRAVENOUS
  Filled 2015-12-24 (×4): qty 2

## 2015-12-24 MED ORDER — ONDANSETRON HCL 4 MG/2ML IJ SOLN
4.0000 mg | INTRAMUSCULAR | Status: DC | PRN
  Administered 2015-12-25 – 2015-12-27 (×4): 4 mg via INTRAVENOUS
  Filled 2015-12-24 (×4): qty 2

## 2015-12-24 MED ORDER — PANTOPRAZOLE SODIUM 40 MG PO TBEC
40.0000 mg | DELAYED_RELEASE_TABLET | Freq: Every day | ORAL | Status: DC
Start: 2015-12-25 — End: 2015-12-27
  Administered 2015-12-25: 40 mg via ORAL
  Filled 2015-12-24: qty 1

## 2015-12-24 MED ORDER — CEFAZOLIN SODIUM 1-5 GM-% IV SOLN
1.0000 g | Freq: Three times a day (TID) | INTRAVENOUS | Status: AC
  Administered 2015-12-24 – 2015-12-25 (×2): 1 g via INTRAVENOUS
  Filled 2015-12-24 (×3): qty 50

## 2015-12-24 MED ORDER — AMLODIPINE BESYLATE 10 MG PO TABS
10.0000 mg | ORAL_TABLET | Freq: Every day | ORAL | Status: DC
  Administered 2015-12-24 – 2015-12-27 (×3): 10 mg via ORAL
  Filled 2015-12-24 (×3): qty 1

## 2015-12-24 MED ORDER — POTASSIUM CHLORIDE 10 MEQ/100ML IV SOLN
10.0000 meq | INTRAVENOUS | Status: AC
  Administered 2015-12-24 (×2): 10 meq via INTRAVENOUS
  Filled 2015-12-24 (×2): qty 100

## 2015-12-24 NOTE — Evaluation (Signed)
Physical Therapy Evaluation Patient Details Name: Jill SideWarren Buccieri MRN: [id number] DOB: Feb 04, 1956 Today's Date: 12/24/2015   History of Present Illness  Mr Melvyn NethLewis on 12/05/15 had a 4 level acdf. Woke up with c5 radiculopathy. Saw rehab medicine. Since then he has been complaining of swelling, unable to swallow,fever, redness of neck area and chest. Seen in the er , a ct neck showed most likely an abscess 3/16 pt underwent cervical exploration with removal of hematoma and cleaning out of abcess.  Clinical Impression  Patient presents with functional limitations due to deficits listed in PT problem list (see below). Pt presents with marked weakness BUEs, decreased strength LLE, decreased balance, function and overall impaired independence. Pt not able to functionally use UEs at this time due to weakness in proximal musculature. Left knee instability noted during ambulation requiring Min A for balance. Pt holding onto wife at home for support for mobility since surgery 2 weeks ago. Pt is total A for all ADLs at this time. Highly motivated to return to independence. Would benefit from CIR to maximize independence, function and mobility so pt can return to PLOF and ease burden of care at home.    Follow Up Recommendations CIR    Equipment Recommendations  None recommended by PT    Recommendations for Other Services Rehab consult     Precautions / Restrictions Precautions Precautions: Cervical Required Braces or Orthoses: Cervical Brace Cervical Brace: Soft collar;At all times Restrictions Weight Bearing Restrictions: No      Mobility  Bed Mobility Overal bed mobility: Needs Assistance Bed Mobility: Sit to Supine       Sit to supine: Min guard   General bed mobility comments: HOB flat, some difficulty bringing LLE into bed.   Transfers Overall transfer level: Needs assistance Equipment used: None Transfers: Sit to/from Stand Sit to Stand: Min assist         General transfer  comment: Min A to boost from low chair with cues to push through BUEs. Stood from Landscape architectchair x2.   Ambulation/Gait Ambulation/Gait assistance: Min assist Ambulation Distance (Feet): 150 Feet Assistive device: None Gait Pattern/deviations: Step-through pattern;Decreased stride length;Decreased stance time - left;Staggering left;Staggering right;Trendelenburg   Gait velocity interpretation: Below normal speed for age/gender General Gait Details: Left knee instability noted during gait training with 1 instance of partial knee buckling requiring Min A to correct. Unsteady. May benefit from DME.  Stairs            Wheelchair Mobility    Modified Rankin (Stroke Patients Only)       Balance Overall balance assessment: Needs assistance Sitting-balance support: Feet supported;No upper extremity supported Sitting balance-Leahy Scale: Good     Standing balance support: During functional activity Standing balance-Leahy Scale: Fair Standing balance comment: Able to stand unsupported statically, requires Min A for dynamic standing.                             Pertinent Vitals/Pain Pain Assessment: No/denies pain    Home Living Family/patient expects to be discharged to:: Inpatient rehab Living Arrangements: Spouse/significant other Available Help at Discharge: Family;Available 24 hours/day Type of Home: Apartment Home Access: Level entry     Home Layout: One level Home Equipment: Crutches;Bedside commode;Shower seat (shower seat does not fit in tub)      Prior Function Level of Independence: Needs assistance   Gait / Transfers Assistance Needed: Pt holds onto wife for mobility at home since surgery.  ADL's /  Homemaking Assistance Needed: Since sx on 12/05/2015 pt has been totally dependent with basic ADLs and IADLs  Comments: Per pt report was on disability for Lt knee arthritis and previously because of brain tumor (from chart 2 weeks ago)     Hand Dominance    Dominant Hand: Right    Extremity/Trunk Assessment   Upper Extremity Assessment:  (BUEs not functional at this time due to weakness esp noted in shoulder elevation, bicep flexion,.)           Lower Extremity Assessment: LLE deficits/detail   LLE Deficits / Details: Grossly ~4/5 throughout with giveway during MMT.      Communication   Communication: No difficulties  Cognition Arousal/Alertness: Awake/alert Behavior During Therapy: WFL for tasks assessed/performed Overall Cognitive Status: Within Functional Limits for tasks assessed                      General Comments      Exercises        Assessment/Plan    PT Assessment Patient needs continued PT services  PT Diagnosis Difficulty walking;Generalized weakness   PT Problem List Decreased strength;Decreased range of motion;Decreased balance;Decreased mobility  PT Treatment Interventions Balance training;Gait training;Functional mobility training;Therapeutic activities;Therapeutic exercise;Patient/family education;DME instruction;Neuromuscular re-education   PT Goals (Current goals can be found in the Care Plan section) Acute Rehab PT Goals Patient Stated Goal: to be independent again PT Goal Formulation: With patient Time For Goal Achievement: 01/07/16 Potential to Achieve Goals: Good    Frequency Min 3X/week   Barriers to discharge        Co-evaluation               End of Session Equipment Utilized During Treatment: Gait belt;Cervical collar Activity Tolerance: Patient tolerated treatment well Patient left: in bed;with call bell/phone within reach;with bed alarm set Nurse Communication: Mobility status         Time: 1337-1400 PT Time Calculation (min) (ACUTE ONLY): 23 min   Charges:   PT Evaluation $PT Eval Moderate Complexity: 1 Procedure PT Treatments $Gait Training: 8-22 mins   PT G Codes:        Keigan Tafoya A Cutler Sunday 12/24/2015, 2:13 PM  Mylo Red, PT,  DPT [phone]

## 2015-12-24 NOTE — Progress Notes (Signed)
Patient ID: William [name], male   DOB: 01-Nov-1955, 60 y.o.   MRN: [id number] Extubated, able to swallow.rehabilitation to see him

## 2015-12-24 NOTE — Progress Notes (Signed)
Called to inform Dr Delford FieldWright of swelling at surgical site, camera visual done by md, md assessed swelling w/o hematoma. Pt reports site feeling "tight" commenting surgeon explained swelling is common.

## 2015-12-24 NOTE — Progress Notes (Signed)
    [facility]   Reason for visit: Follow up on neck abscess  Interval History: extubated over the weekend, remains afebrile, culture remains negative.   Physical Exam: Constitutional:  Filed Vitals:   12/24/15 0800 12/24/15 0900  BP: 121/85 139/105  Pulse: 102 111  Temp:    Resp: 18 19   patient appears in NAD, up in chair eating Respiratory: Normal respiratory effort; CTA B Cardiovascular: RRR  Review of Systems: Constitutional: negative for fevers, chills and anorexia Gastrointestinal: negative for diarrhea Musculoskeletal: negative for myalgias and arthralgias  Lab Results  Component Value Date   WBC 6.6 12/23/2015   HGB 11.6* 12/23/2015   HCT 36.0* 12/23/2015   MCV 87.0 12/23/2015   PLT 158 12/23/2015    Lab Results  Component Value Date   CREATININE 0.63 12/24/2015   BUN <5* 12/24/2015   NA 137 12/24/2015   K 3.4* 12/24/2015   CL 100* 12/24/2015   CO2 28 12/24/2015    Lab Results  Component Value Date   ALT 328* 12/21/2015   AST 194* 12/21/2015   ALKPHOS 64 12/21/2015     Microbiology: Recent Results (from the past 240 hour(s))  Anaerobic culture     Status: None (Preliminary result)   Collection Time: 12/20/15 10:20 AM  Result Value Ref Range Status   Specimen Description WOUND  Final   Special Requests CERVICAL WOUND  Final   Gram Stain PENDING  Incomplete   Culture   Final    NO ANAEROBES ISOLATED; CULTURE IN PROGRESS FOR 5 DAYS [facility]    Report Status PENDING  Incomplete  Wound culture     Status: None   Collection Time: 12/20/15 10:20 AM  Result Value Ref Range Status   Specimen Description WOUND  Final   Special Requests CERVICAL WOUND  Final   Gram Stain   Final    MODERATE WBC PRESENT,BOTH PMN AND MONONUCLEAR NO SQUAMOUS EPITHELIAL CELLS SEEN NO ORGANISMS SEEN Performed at [facility]    Culture   Final    NO GROWTH 2 DAYS Performed at [facility]    Report Status 12/22/2015 FINAL  Final  MRSA PCR Screening     Status: None   Collection Time: 12/20/15 12:44 PM  Result Value Ref Range Status   MRSA by PCR NEGATIVE NEGATIVE Final    Comment:        The GeneXpert MRSA Assay (FDA approved for NASAL specimens only), is one component of a comprehensive MRSA colonization surveillance program. It is not intended to diagnose MRSA infection nor to guide or monitor treatment for MRSA infections.     Impression/Plan:  1. Post op infection - culture remains negative. Will need prolonged IV antibiotics.  Would use vancomycin and ceftriaxone, pending culture.   Treat through at least April 27th Looks like he will be going to rehab vanocmyicn trough 15-20 Ceftriaxone 2 grams daily Twice weekly bmp, vancomycin trough Weekly cbc Will place picc line  2. Rehab - difficulty moving his arms.  As above, to get rehab.  We should see him in our clinic after rehab discharge  Will continue to follow

## 2015-12-24 NOTE — Care Management Note (Signed)
  Patient Details  Name: William [name] MRN: [id number] Date of Birth: 1955/11/30  Subjective/Objective:     Pt is s/p decompression of neck abscess urgently in OR               Action/Plan: Patient is from home, however CIR consult has been place - CSW consulted for back up plan.  CM will continue to follow for disposition needs.   Expected Discharge Date:                  Expected Discharge Plan:     In-House Referral:     Discharge planning Services     Post Acute Care Choice:    Choice offered to:     DME Arranged:    DME Agency:     HH Arranged:    HH Agency:     Status of Service:   In process - will continue to monitor  Medicare Important Message Given:    Date Medicare IM Given:    Medicare IM give by:    Date Additional Medicare IM Given:    Additional Medicare Important Message give by:     If discussed at Long Length of Stay Meetings, dates discussed:    Additional Comments:  Cherylann ParrClaxton, Giavanni Odonovan S, RN 12/24/2015, 12:44 PM [phone]

## 2015-12-24 NOTE — Progress Notes (Signed)
At patient bedside to obtain PICC consent and place PICC.  Patient given risk, benefit and purpose of PICC placement.  Patient with multiple questions regarding if there are other options for treating his infection instead of going home on IV medications.  Patient also with an almost constant issue with secretions requiring him to spit frequently.  Due to the patient's inability to move arm to suction himself during the procedure, this writer will be unable to perform placement by self tonight.  Given that patient wishes to discuss treatment with MD and secretion problem, PICC will not be placed tonight.  IV team/PICC nurse will follow up in morning.  William SheldonAshley, RN bedside nurse updated. Noriel Guthrie, Lajean ManesKerry Loraine, RN VAST

## 2015-12-24 NOTE — Evaluation (Signed)
Clinical/Bedside Swallow Evaluation Patient Details  Name: Jill SideWarren Kofman MRN: [id number] Date of Birth: 1956-09-21  Today's Date: 12/24/2015 Time: SLP Start Time (ACUTE ONLY): 1526 SLP Stop Time (ACUTE ONLY): 1537 SLP Time Calculation (min) (ACUTE ONLY): 11 min  Past Medical History:  Past Medical History  Diagnosis Date  . Hypertension   . Diabetes mellitus without complication (HCC)   . Chronic hepatitis C without hepatic coma (HCC)   . History of cocaine use   . Depression   . Arthritis   . History of TIA (transient ischemic attack)   . Hyperlipidemia   . History of migraine headaches   . Obesity, Class II, BMI 35-39.9   . BPH with obstruction/lower urinary tract symptoms   . Balanitis   . Urinary retention   . Tobacco abuse    Past Surgical History:  Past Surgical History  Procedure Laterality Date  . Rotator  cuff surgery       left shoulder   . Spider bite      on neck  . Anterior cervical decomp/discectomy fusion N/A 12/05/2015    Procedure: Anterior Cervical Decompression Fusion Cervical three-four, Cervical four-five, Cervical five-six, Cervical six-seven;  Surgeon: Julio SicksHenry Pool, MD;  Location: [facility];  Service: Neurosurgery;  Laterality: N/A;  . Anterior cervical decompression for epidural abscess N/A 12/20/2015    Procedure: ANTERIOR CERVICAL DECOMPRESSION FOR EPIDURAL ABSCESS;  Surgeon: Hilda LiasErnesto Botero, MD;  Location: [facility];  Service: Neurosurgery;  Laterality: N/A;  ANTERIOR CERVICAL DECOMPRESSION FOR EPIDURAL ABSCESS   HPI:  60yo male smoker with hx HTN, DM, Hep C, spinal stenosis s/p anterior cervical fusion on 3/1. Had C5 radiculopathy following surgery then went to rehab. Returned on 3/16 with c/o swelling, dysphagia, fever. CT neck in ER revealed large surgical site abscess extending from skin to hardware. He was taken urgently to OR 3/16 and remained intubated post op for airway protection. Extubated 3/19. "Difficult airway" noted regarding intubation.     Assessment / Plan / Recommendation Clinical Impression  Patient presents with evidence of a moderate-severe dysphagia, likely secondary to post-op pharyngeal edema impacting airway closure. Relatively consistent throat clearing and/or cough response noted with all pos assessed and patient endorses frequent need to expectorate secretions at baseline. Reports onset after initial surgery, increasing in severity prior to admission for second surgery and now post surgery. Recommend patient continue current diet as suspect sensation is largely intact and strong throat clear and/or cough response is successfully expelling penetrates/aspirates from the airway. Additionally, patient cognitively intact and can carryout compensatory strategies to further increase safety with pos. MBS recommended for 3/21 to determine potential to advance diet as well as if additional compensatory strategies may be helpful in decreasing aspiration risk as edema decreases.     Aspiration Risk  Moderate aspiration risk    Diet Recommendation Thin liquid (continue clear liquids)   Liquid Administration via: Cup;Straw Medication Administration: Crushed with puree Supervision: Staff to assist with self feeding Compensations: Slow rate;Small sips/bites Postural Changes: Seated upright at 90 degrees    Other  Recommendations Oral Care Recommendations: Oral care BID   Follow up Recommendations  Inpatient Rehab           Prognosis Prognosis for Safe Diet Advancement: Good Barriers to Reach Goals: Severity of deficits      Swallow Study   General HPI: 60yo male smoker with hx HTN, DM, Hep C, spinal stenosis s/p anterior cervical fusion on 3/1. Had C5 radiculopathy following surgery then went to  rehab. Returned on 3/16 with c/o swelling, dysphagia, fever. CT neck in ER revealed large surgical site abscess extending from skin to hardware. He was taken urgently to OR 3/16 and remained intubated post op for airway  protection. Extubated 3/19. "Difficult airway" noted regarding intubation.  Type of Study: Bedside Swallow Evaluation Previous Swallow Assessment: none Diet Prior to this Study: Thin liquids (clear liquids) Temperature Spikes Noted: No Respiratory Status: Room air History of Recent Intubation: Yes Length of Intubations (days): 3 days Date extubated: 12/23/15 Behavior/Cognition: Alert;Cooperative;Pleasant mood Oral Cavity Assessment: Within Functional Limits Oral Care Completed by SLP: No Oral Cavity - Dentition: Adequate natural dentition Vision: Functional for self-feeding Self-Feeding Abilities: Needs assist (due to UE weakness) Patient Positioning: Upright in bed Baseline Vocal Quality: Normal Volitional Cough: Strong Volitional Swallow: Able to elicit    Oral/Motor/Sensory Function Overall Oral Motor/Sensory Function: Within functional limits   Ice Chips Ice chips: Impaired Presentation: Spoon Pharyngeal Phase Impairments: Multiple swallows;Throat Clearing - Immediate;Throat Clearing - Delayed   Thin Liquid Thin Liquid: Impaired Presentation: Straw Pharyngeal  Phase Impairments: Multiple swallows;Throat Clearing - Immediate;Cough - Immediate    Nectar Thick Nectar Thick Liquid: Not tested   Honey Thick Honey Thick Liquid: Not tested   Puree Puree: Impaired Presentation: Spoon Pharyngeal Phase Impairments: Multiple swallows;Throat Clearing - Immediate;Throat Clearing - Delayed   Solid   GO  Laniah Grimm MA, CCC-SLP [phone]  Solid: Not tested        Yaremi Stahlman Meryl 12/24/2015,3:46 PM

## 2015-12-24 NOTE — Progress Notes (Signed)
PULMONARY / CRITICAL CARE MEDICINE   Name:William [name] ZOX:[id number] DOB:Mar 29, 1956   ADMISSION DATE: 12/20/2015 CONSULTATION DATE: 3/16  REFERRING MD: [name]   CHIEF COMPLAINT: Airway compromise   HISTORY OF PRESENT ILLNESS:  60yo male smoker with hx HTN, DM, Hep C, spinal stenosis s/p anterior cervical fusion on 3/1. Returned on 3/16 with c/o swelling, dysphagia, fever. CT neck in ER revealed abscess and he was taken urgently to OR and remains intubated post op for airway protection. PCCM consulted for vent management.   SUBJECTIVE:  Extubated 3/19, stable NPO still In chair  VITAL SIGNS: BP 141/101 mmHg  Pulse 118  Temp(Src) 98.7 F (37.1 C) (Oral)  Resp 23  Ht  (1.778 m)  Wt 105.5 kg (232 lb 9.4 oz)  BMI 33.37 kg/m2  SpO2 97%  HEMODYNAMICS:    VENTILATOR SETTINGS:    INTAKE / OUTPUT: I/O last 3 completed shifts: In: 3338.9 [I.V.:2123.9; NG/GT:190; IV Piggyback:1025] Out: 3195 [Urine:2595; Emesis/NG output:600]   PHYSICAL EXAMINATION:  Gen: sitting up in chair HENT: soft collar in place, wound dressed, dressing c/d/i PULM: few crackles left base, otherwise clear, normal effort CV: RRR, no mgr GI: BS+, soft, nontender MSK: normal bulk and tone Neuro: A*&Ox4, maew  LABS:  BMET  Recent Labs Lab 12/22/15 0233 12/23/15 0235 12/24/15 0312  NA 139 138 137  K 3.6 3.1* 3.4*  CL 104 101 100*  CO2 BUN 15 7 <5*  CREATININE 0.84 0.70 0.63  GLUCOSE 228* 174* 129*    Electrolytes  Recent Labs Lab 12/21/15 0400 12/22/15 0233 12/23/15 0235 12/24/15 0312  CALCIUM 8.1* 8.3* 8.4* 8.8*  MG 1.6*  --   --   --   PHOS 3.2  --   --   --     CBC  Recent Labs Lab 12/21/15 0400 12/22/15 0233 12/23/15 0235  WBC 6.6 6.1 6.6  HGB 11.5* 11.1* 11.6*  HCT 34.5* 35.1* 36.0*  PLT 128* 134* 158    Coag's No results for input(s): APTT, INR in the last 168 hours.  Sepsis Markers  Recent Labs Lab 12/20/15 1258  12/20/15 1420  LATICACIDVEN 1.4 1.5    ABG  Recent Labs Lab 12/20/15 1335  PHART 7.332*  PCO2ART 53.5*  PO2ART 111.0*    Liver Enzymes  Recent Labs Lab 12/17/15 1446 12/21/15 0400  AST 325* 194*  ALT 430* 328*  ALKPHOS 68 64  BILITOT 0.7 0.7  ALBUMIN 3.1* 2.1*    Cardiac Enzymes No results for input(s): TROPONINI, PROBNP in the last 168 hours.  Glucose  Recent Labs Lab 12/23/15 1220 12/23/15 1605 12/23/15 1946 12/24/15 0024 12/24/15 0417 12/24/15 0743  GLUCAP 152* 143* 161* 153* 109* 118*    Imaging Dg Abd 1 View  12/23/2015  CLINICAL DATA:  Nausea and vomiting EXAM: ABDOMEN - 1 VIEW COMPARISON:  December 20, 2015 FINDINGS: There is moderate stool throughout the colon. There is no bowel dilatation or air-fluid level suggesting obstruction. No free air. There is atelectatic change in the left lung base region. Early pneumonia in this area cannot be excluded. IMPRESSION: Bowel gas pattern unremarkable without obstruction or free air evident. Atelectasis left base with questionable early pneumonia in this area. Electronically Signed   By: Bretta Bang III M.D.   On: 12/23/2015 15:18    STUDIES:  CT chest / soft tissue neck 03/16 > Recent ACDF with large surgical site abscess extending from skin to hardware. Abscess and related edema causes severe airway compromise.  No mediastinitis.  CULTURES: Wound 03/16 >  ANTIBIOTICS: Vanc 03/16 > off Zosyn 03/16 > 3/20  Ceftriaxone 3/20 >  SIGNIFICANT EVENTS: 03/01 > ACDF 03/16 > back to OR for anterior cervical decompression for epidural abscess  LINES/TUBES: ETT 03/16 > 3/19 Neck hemovac 03/16 >3/17  DISCUSSION: 60 y.o. M who had ACDF on 03/01. Had C5 radiculopathy following surgery then went to rehab. Since then had swelling with difficulty swallowing, fever, erythema. He was found to have surgical site abscess; therefore, was taken back to OR for anterior cervical decompression for epidural abscess.  Extubated successfully on 3/19  ASSESSMENT / PLAN:  NEUROLOGIC A:  Acute metabolic encephalopathy - resolved Hx cocaine abuse, depression, arthritis, TIA Cervical stenosis - s/p ACDF 03/01. Had surgical site abscess; therefore, was taken to OR 03/16 for I&D and anterior decompression P:  Post op care per neurosurgery Continue gabapentin  PULMONARY A: VDRF - following anterior cervical decompression for epidural abscess 03/16> resolved Tobacco use disorder Difficult airway  ( 1 Hour per NS to obtain awake intubation in OR) P:  SLP evaluation for dysphagia Monitor O2 saturation  CARDIOVASCULAR A:  Hx HTN, HLD P:  Monitor hemodynamics Restart home amlodipine  RENAL  A:  Hypokalemia P:  Monitor BMET and UOP Replace electrolytes as needed   GASTROINTESTINAL A:  Hx HCV GERD Nutrition Obesity P:  PPI to po Advance diet per SLP recs  HEMATOLOGIC A:  VTE Prophylaxis P:  SCD's CBC in AM Add sub q hep  INFECTIOUS A:  Epidural abscess - s/p I&D with anterior cervical decompression P:  Abx per ID Follow culture from OR (negative so far)   ENDOCRINE A:  DM.  P:  SSI. Lantus  25 units daily   Family updated: Fiance at bedside 3/18   Interdisciplinary Family Meeting v Palliative Care Meeting: Due by: 03/23.   To floor, PCCM off  Heber CarolinaBrent Lorenzo Arscott, MD [place] [facility] Pager: [phone] Cell: [phone] After 3pm or if no response, call [phone]

## 2015-12-24 NOTE — Progress Notes (Signed)
I will follow up with pt and family to discuss a possible inpt rehab admit pending bed availability when pt medially ready. [phone]

## 2015-12-24 NOTE — Progress Notes (Signed)
Pharmacy Antibiotic Note  William [name] is a 60 y.o. male admitted on 12/20/2015 with abscess of neck s/p cervical fusion.  S/p OR 3/16.  WBC WNL, Afebrile. Wound culture no growth to date. Vanc level low at 7 on 1 gm q12.  VT goal 15-20 mcg/ml.   Plan:  Increase vancomycin to 1 gm q8h Change zosyn to CTX 2 gm q24 To get PICC line for prolonged abx, at least through April 27th BMP q72 hrs, weekly VT, weekly CBC  Height: 5\' 10"  (177.8 cm) Weight: 232 lb 9.4 oz (105.5 kg) IBW/kg (Calculated) : 73  Temp (24hrs), Avg:98.4 F (36.9 C), Min:97.6 F (36.4 C), Max:99.2 F (37.3 C)   Recent Labs Lab 12/17/15 1446 12/20/15 0515 12/20/15 0551 12/20/15 1258 12/20/15 1420 12/21/15 0400 12/22/15 0233 12/23/15 0235 12/24/15 0312 12/24/15 0758  WBC 8.3 11.7*  --   --   --  6.6 6.1 6.6  --   --   CREATININE 0.69  --  0.60*  --   --  0.85 0.84 0.70 0.63  --   LATICACIDVEN  --   --   --  1.4 1.5  --   --   --   --   --   VANCOTROUGH  --   --   --   --   --   --   --   --   --  7*    Estimated Creatinine Clearance: 119.4 mL/min (by C-G formula based on Cr of 0.63).    Allergies  Allergen Reactions  . Metformin And Related Other (See Comments)    Possible liver inflammation  . Prednisone Other (See Comments)    Disoriented   . Tamsulosin Other (See Comments)    Nose bleed  . Other Other (See Comments)    Reports all narcotics make him sick. Pt premeds with Reglan to stop nausea    Antimicrobials this admission: Vancomycin 3/16>> Zosyn 3/16>>3/20 CTX 3/20>>  Dose adjustments this admission: VT 3/20 = 7 - increase to 1 gm q8 from 1 gm q12  Microbiology results: 3/16 Neck Wound>>NGTD  Herby AbrahamMichelle T. Billey Wojciak, Pharm.D. [phone] 12/24/2015 10:16 AM

## 2015-12-24 NOTE — Consult Note (Signed)
Physical Medicine and Rehabilitation Consult  Reason for Consult: Central cord syndrome Referring Physician: Dr. [name]   HPI: William [name] is a 60 y.o. male with history of HTN, DMT2, Hep C, cervical stenosis with myelopathy s/p ACDF C3-C7 12/05/15 with post post op near complete loss of shoulder abduction and marked bilateral biceps weakness but improvement in overall neurologic deficits. He was admitted on 12/20/15 with neck swelling, inability to swallow, fever and erythema of neck/chest. CT neck done revealing large abscess 14 cm  X 6 cm from skin surface to hardware with edema causing severe airway compromise. He was taken to OR for exploration of wound with drainage of abscess and irrigation of esophagus by Dr. [name] on the same day. He remained intubated thorough 3/19 and respiratory status has been stable post extubation. ID consulted for input on antibiotic regimen and recommended continuing Vancomycin/Ceftriaxone till April 27th with twice weekly BMP and Vanc trough.  Patient continues to have BUE weakness affecting ability to carry out ADL tasks. CIR recommended for follow up therapy.    Review of Systems  HENT: Negative for hearing loss.   Eyes: Negative for blurred vision and double vision.  Respiratory: Negative for cough and sputum production.   Gastrointestinal: Positive for constipation. Negative for nausea.  Genitourinary: Negative for dysuria and urgency.  Musculoskeletal: Positive for neck pain. Negative for back pain and joint pain.  Neurological: Positive for sensory change and focal weakness. Negative for dizziness, speech change, weakness and headaches.  Psychiatric/Behavioral: Positive for memory loss. Negative for depression.  All other systems reviewed and are negative.     Past Medical History  Diagnosis Date  . Hypertension   . Diabetes mellitus without complication (HCC)   . Chronic hepatitis C without hepatic coma (HCC)   . History of cocaine use    . Depression   . Arthritis   . History of TIA (transient ischemic attack)   . Hyperlipidemia   . History of migraine headaches   . Obesity, Class II, BMI 35-39.9   . BPH with obstruction/lower urinary tract symptoms   . Balanitis   . Urinary retention   . Tobacco abuse     Past Surgical History  Procedure Laterality Date  . Rotator  cuff surgery       left shoulder   . Spider bite      on neck  . Anterior cervical decomp/discectomy fusion N/A 12/05/2015    Procedure: Anterior Cervical Decompression Fusion Cervical three-four, Cervical four-five, Cervical five-six, Cervical six-seven;  Surgeon: Julio Sicks, MD;  Location: [facility];  Service: Neurosurgery;  Laterality: N/A;  . Anterior cervical decompression for epidural abscess N/A 12/20/2015    Procedure: ANTERIOR CERVICAL DECOMPRESSION FOR EPIDURAL ABSCESS;  Surgeon: Hilda Lias, MD;  Location: [facility];  Service: Neurosurgery;  Laterality: N/A;  ANTERIOR CERVICAL DECOMPRESSION FOR EPIDURAL ABSCESS    Family History  Problem Relation Age of Onset  . Hypertension Mother   . Diabetes Mother   . Heart disease Mother   . Thyroid disease Mother   . Hypertension Father   . Diabetes Father   . Heart disease Father     Social History: Married. Wife at home and patient has been needing assistance with ADL tasks since last surgery.   Has been disabled due to knee OA. He reports that he quit smoking couple of months ago.  He has been smoking about 3 cigarettes /day since age 36.   He has a  3 pack-year smoking history. He does not have any smokeless tobacco history on file. He reports that he drinks alcohol. He reports that he uses illicit drugs.    Allergies  Allergen Reactions  . Metformin And Related Other (See Comments)    Possible liver inflammation  . Prednisone Other (See Comments)    Disoriented   . Tamsulosin Other (See Comments)    Nose bleed  . Other Other (See Comments)    Reports all narcotics make him sick.  Pt premeds with Reglan to stop nausea    Medications Prior to Admission  Medication Sig Dispense Refill  . amLODipine (NORVASC) 10 MG tablet Take 1 tablet (10 mg total) by mouth daily. 30 tablet 3  . BD PEN NEEDLE NANO U/F 32G X 4 MM MISC USE WITH FLEXPEN TO ADMINISTER INSULIN 100 each 5  . cyclobenzaprine (FLEXERIL) 10 MG tablet Take 1 tablet (10 mg total) by mouth 3 (three) times daily as needed for muscle spasms. 60 tablet 3  . diphenhydrAMINE (BENADRYL) 25 MG tablet Take 1 tablet (25 mg total) by mouth every 6 (six) hours. As needed with Compazine for headache. (Patient taking differently: Take 25 mg by mouth every 8 (eight) hours as needed for itching or allergies. As needed with Compazine for headache.) 20 tablet 0  . Elastic Bandages & Supports (THUMB BRACE) MISC 1 Device by Does not apply route daily as needed. 1 each 0  . fluconazole (DIFLUCAN) 150 MG tablet TAKE 1 TABLET (150 MG TOTAL) BY MOUTH EVERY 7 (SEVEN) DAYS. (Patient taking differently: TAKE 1 TABLET (150 MG TOTAL) BY MOUTH EVERY 7 (SEVEN) DAYS on Monday) 4 tablet 0  . gabapentin (NEURONTIN) 100 MG capsule Take 1 capsule (100 mg total) by mouth 3 (three) times daily. 90 capsule 3  . glucose blood (ACCU-CHEK AVIVA) test strip Use as instructed to check blood glucose 4x/day 200 each 12  . insulin aspart (NOVOLOG FLEXPEN) 100 UNIT/ML FlexPen Inject 15 Units into the skin 3 (three) times daily with meals. (Patient taking differently: Inject 25 Units into the skin 3 (three) times daily with meals. ) 8 pen 5  . Insulin Glargine (LANTUS SOLOSTAR) 100 UNIT/ML Solostar Pen Inject 30 Units into the skin 2 (two) times daily. (Patient taking differently: Inject 50 Units into the skin daily at 10 pm. ) 6 pen 2  . Insulin Pen Needle (EASY TOUCH PEN NEEDLES) 32G X 6 MM MISC Use with Flexpen to administer Insulin 100 each 11  . Lancets (FREESTYLE) lancets Use as instructed to check blood glucose 4x/day 200 each 12  . metoCLOPramide (REGLAN) 10 MG  tablet Take 1 tablet (10 mg total) by mouth 3 (three) times daily as needed for nausea (headache / nausea). 20 tablet 0  . oxyCODONE-acetaminophen (PERCOCET/ROXICET) 5-325 MG tablet Take 1-2 tablets by mouth every 6 (six) hours as needed for severe pain. 90 tablet 0  . pantoprazole (PROTONIX) 40 MG tablet Take 1 tablet (40 mg total) by mouth 2 (two) times daily. Twice a day for 10 days; then start using it once a day (Patient taking differently: Take 40 mg by mouth daily. ) 60 tablet 1  . sucralfate (CARAFATE) 1 GM/10ML suspension Take 10 mLs (1 g total) by mouth 4 (four) times daily -  with meals and at bedtime. 420 mL 1    Home: Home Living Family/patient expects to be discharged to:: Inpatient rehab Living Arrangements: Spouse/significant other Available Help at Discharge: Family, Available 24 hours/day Type of Home: Apartment  Home Access: Level entry Home Layout: One level Bathroom Shower/Tub: Engineer, manufacturing systems: Standard Home Equipment: Crutches, Bedside commode, Shower seat (shower seat does not fit his tub)  Functional History: Prior Function Level of Independence: Needs assistance Gait / Transfers Assistance Needed: Pt has been independent with basic ambulation ADL's / Homemaking Assistance Needed: Since sx on 12/05/2015 pt has been totally dependent with basic ADLs and IADLs Comments: Per pt report was on disability for Lt knee arthritis and previously because of brain tumor (from chart 2 weeks ago) Functional Status:  Mobility: Bed Mobility Overal bed mobility: Needs Assistance Bed Mobility: Supine to Sit Supine to sit: Min guard General bed mobility comments: raised straight up into long sitting to then move legs off of bed  Transfers Overall transfer level: Needs assistance Equipment used: 1 person hand held assist Transfers: Sit to/from Stand, Stand Pivot Transfers Sit to Stand: Min assist Stand pivot transfers: Min assist      ADL: ADL Overall ADL's :  Needs assistance/impaired Eating/Feeding: Maximal assistance, Sitting Eating/Feeding Details (indicate cue type and reason): Pt feed himself 75% of his meal with increased time with weight of arm fully supported and him focusing on bringing food/drink to mouth NOT bringing mouth to food/drink. It took him approximately 35 minutes to eat 3/4 of Jello and drink 1/3 of his liquids Grooming: Wash/dry hands, Wash/dry face, Sitting (shaving) Grooming Details (indicate cue type and reason): Pt max A with full weight of arm supported to wash face with RUE. Pt max A with LUE and RUE full weight of arm supported to shave with electric razor and safety razor half of his face/neck. Pt able to use gel for hands once gel put in his hands and hands in lap (not able to raise arms off of his lap independently Lower Body Dressing Details (indicate cue type and reason): Pt able to cross legs to get to feet. He could doff right sock and don 1/2 way with right sock (once it was halfway started on his foot) with LUE. He could only have doff and don left sock with RUE General ADL Comments: Pt currently total A for all basic ADLs due to decreased use of his arms, sit<>stand min A  Cognition: Cognition Overall Cognitive Status: Within Functional Limits for tasks assessed Orientation Level: Oriented X4 Cognition Arousal/Alertness: Awake/alert Behavior During Therapy: WFL for tasks assessed/performed Overall Cognitive Status: Within Functional Limits for tasks assessed   Blood pressure 138/90, pulse 122, temperature 98.1 F (36.7 C), temperature source Oral, resp. rate 22, height 5\' 10"  (1.778 m), weight 105.5 kg (232 lb 9.4 oz), SpO2 95 %. Physical Exam  Nursing note and vitals reviewed. Constitutional: He is oriented to person, place, and time. He appears well-developed and well-nourished.  Sitting up in chair.    HENT:  Head: Normocephalic and atraumatic.  Mouth/Throat: Oropharynx is clear and moist.  Eyes:  Conjunctivae and EOM are normal. Pupils are equal, round, and reactive to light.  Neck:  Soft collar in place  Cardiovascular: Regular rhythm.  Tachycardia present.   Heart rate 120 at rest.   Respiratory: Effort normal and breath sounds normal. No respiratory distress. He has no wheezes. He exhibits no tenderness.  GI: Soft. Bowel sounds are normal. He exhibits no distension. There is no tenderness.  Musculoskeletal: He exhibits edema. He exhibits no tenderness.  Neurological: He is alert and oriented to person, place, and time.  Speech clear but kept expectorating saliva due to difficulty handling secretions--denied difficulty swallowing.  Motor: RUE: deltoid 1/5, biceps/triceps 3/5, intrinsics 4+5. LUE deltoid 2+/5, biceps/triceps 2+/5, intrinsics 4+/5. BLE: 5/5 proximal to distal Sensation intact to light touch DTRs symmetric  Skin: Skin is dry. No rash noted.  Psychiatric: He has a normal mood and affect. His behavior is normal.    Results for orders placed or performed during the hospital encounter of 12/20/15 (from the past 24 hour(s))  Glucose, capillary     Status: Abnormal   Collection Time: 12/23/15 12:20 PM  Result Value Ref Range   Glucose-Capillary 152 (H) 65 - 99 mg/dL  Glucose, capillary     Status: Abnormal   Collection Time: 12/23/15  4:05 PM  Result Value Ref Range   Glucose-Capillary 143 (H) 65 - 99 mg/dL  Glucose, capillary     Status: Abnormal   Collection Time: 12/23/15  7:46 PM  Result Value Ref Range   Glucose-Capillary 161 (H) 65 - 99 mg/dL   Comment 1 Notify RN   Glucose, capillary     Status: Abnormal   Collection Time: 12/24/15 12:24 AM  Result Value Ref Range   Glucose-Capillary 153 (H) 65 - 99 mg/dL   Comment 1 Notify RN   Basic metabolic panel     Status: Abnormal   Collection Time: 12/24/15  3:12 AM  Result Value Ref Range   Sodium 137 135 - 145 mmol/L   Potassium 3.4 (L) 3.5 - 5.1 mmol/L   Chloride 100 (L) 101 - 111 mmol/L   CO2 28 22 - 32  mmol/L   Glucose, Bld 129 (H) 65 - 99 mg/dL   BUN <5 (L) 6 - 20 mg/dL   Creatinine, Ser 1.61 0.61 - 1.24 mg/dL   Calcium 8.8 (L) 8.9 - 10.3 mg/dL   GFR calc non Af Amer >60 >60 mL/min   GFR calc Af Amer >60 >60 mL/min   Anion gap 9 5 - 15  Glucose, capillary     Status: Abnormal   Collection Time: 12/24/15  4:17 AM  Result Value Ref Range   Glucose-Capillary 109 (H) 65 - 99 mg/dL   Comment 1 Notify RN   Glucose, capillary     Status: Abnormal   Collection Time: 12/24/15  7:43 AM  Result Value Ref Range   Glucose-Capillary 118 (H) 65 - 99 mg/dL  Vancomycin, trough     Status: Abnormal   Collection Time: 12/24/15  7:58 AM  Result Value Ref Range   Vancomycin Tr 7 (L) 10.0 - 20.0 ug/mL   Dg Abd 1 View  12/23/2015  CLINICAL DATA:  Nausea and vomiting EXAM: ABDOMEN - 1 VIEW COMPARISON:  December 20, 2015 FINDINGS: There is moderate stool throughout the colon. There is no bowel dilatation or air-fluid level suggesting obstruction. No free air. There is atelectatic change in the left lung base region. Early pneumonia in this area cannot be excluded. IMPRESSION: Bowel gas pattern unremarkable without obstruction or free air evident. Atelectasis left base with questionable early pneumonia in this area. Electronically Signed   By: Bretta Bang III M.D.   On: 12/23/2015 15:18   Dg Chest Port 1 View  12/23/2015  CLINICAL DATA:  Respiratory failure EXAM: PORTABLE CHEST 1 VIEW COMPARISON:  Chest radiograph from one day prior. FINDINGS: Endotracheal tube tip is 3.5 cm above the carina. Enteric tube enters the stomach with the tip not seen on this image. Surgical hardware from ACDF and skin staples are partially visualized overlying the lower cervical spine. Stable cardiomediastinal silhouette with top-normal heart size.  No pneumothorax. No pleural effusion. Patchy left basilar lung opacity appears stable. Decreased right basilar atelectasis. No pulmonary edema. IMPRESSION: 1. Well-positioned support  hardware as described . 2. Decreased right basilar atelectasis. Stable patchy left basilar lung opacities. Electronically Signed   By: Delbert PhenixJason A Poff M.D.   On: 12/23/2015 07:37    Assessment/Plan: Diagnosis: Central cord syndrome Labs and images independently reviewed.  Records reviewed and summated above.  1. Does the need for close, 24 hr/day medical supervision in concert with the patient's rehab needs make it unreasonable for this patient to be served in a less intensive setting? Potentially  2. Co-Morbidities requiring supervision/potential complications: HTN (monitor and provide prns in accordance with increased physical exertion and pain), DMT2  (Monitor in accordance with exercise and adjust meds as necessary), Hep C (avoid hepatotoxic meds), cervical stenosis with myelopathy s/p surgery, Tachycardia (monitor in accordance with pain and increasing activity), tachypnea (monitor RR and O2 Sats with increased physical exertion), hypokalemia (cont to monitor and replete as necessary recording), abscess s/p surgery (continue abx per ID) 3. Due to safety, skin/wound care, disease management, pain management and patient education, does the patient require 24 hr/day rehab nursing? Yes 4. Does the patient require coordinated care of a physician, rehab nurse, PT (1-2 hrs/day, 5 days/week) and OT (1-2 hrs/day, 5 days/week) to address physical and functional deficits in the context of the above medical diagnosis(es)? Potentially Addressing deficits in the following areas: balance, endurance, locomotion, strength, transferring, bathing, dressing, feeding, grooming, toileting and psychosocial support 5. Can the patient actively participate in an intensive therapy program of at least 3 hrs of therapy per day at least 5 days per week? Potentially 6. The potential for patient to make measurable gains while on inpatient rehab is TBD 7. Anticipated functional outcomes upon discharge from inpatient rehab are TBD   with PT, TBD with OT, n/a with SLP. 8. Estimated rehab length of stay to reach the above functional goals is: TBD. 9. Does the patient have adequate social supports and living environment to accommodate these discharge functional goals? Potentially 10. Anticipated D/C setting: Home 11. Anticipated post D/C treatments: HH therapy and Home excercise program 12. Overall Rehab/Functional Prognosis: good  RECOMMENDATIONS: This patient's condition is appropriate for continued rehabilitative care in the following setting: Patient has not been evaluated by physical therapy as of yet. Prior to recent discharge patient did not have a PT need. Will await formal consult before recommending most appropriate discharge disposition. Patient has agreed to participate in recommended program. Yes Note that insurance prior authorization may be required for reimbursement for recommended care.  Comment: Rehab Admissions Coordinator to follow up.  Maryla MorrowAnkit Patel, MD 12/24/2015

## 2015-12-24 NOTE — Progress Notes (Signed)
Occupational Therapy Treatment Patient Details Name: William [name] MRN: [id number] DOB: 11-10-1955 Today's Date: 12/24/2015    History of present illness William [name] on 12/05/15 had a 4 level acdf. Woke up with c5 radiculopathy. Saw rehab medicine. Since then he has been complaining of swelling, unable to swallow,fever, redness of neck area and chest. Seen in the er , a ct neck showed most likely an abscess 3/16 pt underwent cervical exploration with removal of hematoma and cleaning out of abcess.   OT comments  [name] admitted with above presents to acute OT today with making progress with self feeding, grooming, and exercises. I did note since I saw him yesterday that he does have more AROM for elbow flexion than he had yesterday and he is able to isolate RUE supination better than yesterday. We just started on self feeding and grooming today. He would greatly benefit from CIR to continue to aggressively work on his UEs.  Follow Up Recommendations  CIR          Precautions / Restrictions Precautions Precautions: Cervical Required Braces or Orthoses: Cervical Brace Cervical Brace: Soft collar;At all times Restrictions Weight Bearing Restrictions: No              ADL Overall ADL's : Needs assistance/impaired Eating/Feeding: Maximal assistance;Sitting Eating/Feeding Details (indicate cue type and reason): Pt feed himself 75% of his meal with increased time with weight of arm fully supported and him focusing on bringing food/drink to mouth NOT bringing mouth to food/drink. It took him approximately 35 minutes to eat 3/4 of Jello and drink 1/3 of his liquids Grooming: Wash/dry hands;Wash/dry face;Sitting (shaving) Grooming Details (indicate cue type and reason): Pt max A with full weight of arm supported to wash face with RUE. Pt max A with LUE and RUE full weight of arm supported to shave with electric razor and safety razor half of his face/neck. Pt able to use gel for hands once  gel put in his hands and hands in lap (not able to raise arms off of his lap independently               Lower Body Dressing Details (indicate cue type and reason): Pt able to cross legs to get to feet. He could doff right sock and don 1/2 way with right sock (once it was halfway started on his foot) with LUE. He could only have doff and don left sock with RUE                                      Exercises Other Exercises Other Exercises: Bil UEs while seated in recliner. Pt completed AROM 10 reps of forward/backward lap slides, 10 AROM reps of horizontal (back and forth) lap slides, 5 reps of AROM forearm supination/pronation with compensatory muscles used more for LUE, 10 AROM reps of lap pats (flexion/extension of wrist), 10 AROM of composite flexion/extension; 5 reps of AAROM for hold for shoulder flexion once arm placed at shoulder height, and 5 reps AAROM of elbow flexion/extension to touch chin and extend arm  with full weight of arm supported. Pt reported he hand worked on supination/pronation, lap pats, and composite finger flexion/extension last night. I encourage him to do these again today as well as forward/backward  and horzontal lap slides (10 times each 2 more times today)--he verbalized understanding  Frequency Min 3X/week     Progress Toward Goals  OT Goals(current goals can now be found in the care plan section)  Progress towards OT goals: Progressing toward goals     Plan Discharge plan remains appropriate       End of Session Equipment Utilized During Treatment: Cervical collar   Activity Tolerance Patient tolerated treatment well   Patient Left in chair;with call bell/phone within reach   Nurse Communication  (Pt worked no feeding with me, but staff will have to A at other meals, pt A'd with shaving himself)        Time: [id number] OT Time Calculation (min): 66 min  Charges: OT General Charges $OT Visit: 1  Procedure OT Treatments $Self Care/Home Management : 53-67 mins $Therapeutic Activity: 23-37 mins  Evette GeorgesLeonard, Judianne Seiple Eva [phone] 12/24/2015, 11:13 AM

## 2015-12-24 NOTE — Progress Notes (Signed)
Pt arrived on unit. Pt awake, alert, and oriented to unit. Spouse arrived with pt.

## 2015-12-25 ENCOUNTER — Ambulatory Visit: Payer: Medicaid Other | Admitting: Family Medicine

## 2015-12-25 ENCOUNTER — Inpatient Hospital Stay (HOSPITAL_COMMUNITY): Payer: Medicaid Other

## 2015-12-25 LAB — GLUCOSE, CAPILLARY
GLUCOSE-CAPILLARY: 148 mg/dL — AB (ref 65–99)
GLUCOSE-CAPILLARY: 201 mg/dL — AB (ref 65–99)
GLUCOSE-CAPILLARY: 208 mg/dL — AB (ref 65–99)
GLUCOSE-CAPILLARY: 230 mg/dL — AB (ref 65–99)
Glucose-Capillary: 134 mg/dL — ABNORMAL HIGH (ref 65–99)
Glucose-Capillary: 153 mg/dL — ABNORMAL HIGH (ref 65–99)
Glucose-Capillary: 230 mg/dL — ABNORMAL HIGH (ref 65–99)
Glucose-Capillary: 240 mg/dL — ABNORMAL HIGH (ref 65–99)

## 2015-12-25 LAB — BASIC METABOLIC PANEL
ANION GAP: 11 (ref 5–15)
BUN: <5 mg/dL — ABNORMAL LOW (ref 6–20)
CALCIUM: 9 mg/dL (ref 8.9–10.3)
CHLORIDE: 100 mmol/L — AB (ref 101–111)
CO2: 26 mmol/L (ref 22–32)
CREATININE: 0.67 mg/dL (ref 0.61–1.24)
GFR calc non Af Amer: >60 mL/min (ref >60)
Glucose, Bld: 202 mg/dL — ABNORMAL HIGH (ref 65–99)
Potassium: 2.9 mmol/L — ABNORMAL LOW (ref 3.5–5.1)
SODIUM: 137 mmol/L (ref 135–145)

## 2015-12-25 LAB — ANAEROBIC CULTURE

## 2015-12-25 MED ORDER — POTASSIUM CHLORIDE CRYS ER 20 MEQ PO TBCR
20.0000 meq | EXTENDED_RELEASE_TABLET | Freq: Two times a day (BID) | ORAL | Status: DC
  Administered 2015-12-27: 20 meq via ORAL
  Filled 2015-12-25 (×2): qty 1

## 2015-12-25 MED ORDER — POTASSIUM CHLORIDE CRYS ER 20 MEQ PO TBCR: 20.0000 meq | EXTENDED_RELEASE_TABLET | ORAL | Status: AC

## 2015-12-25 NOTE — Progress Notes (Addendum)
Nutrition Follow-up  DOCUMENTATION CODES:   Obesity unspecified  INTERVENTION:    If TF started, recommend Jevity 1.2 formula at goal rate of 60 ml/hr with 30 ml Prostat liquid protein BID to provide 1928 kcals, 110 gm protein, 1162 ml of free water daily  NUTRITION DIAGNOSIS:   Inadequate oral intake related to inability to eat as evidenced by NPO status, ongoing  GOAL:   Patient will meet greater than or equal to 90% of their needs, currently unmet  MONITOR:   TF tolerance, Labs, Weight trends, I & O's  ASSESSMENT:   60yo male smoker with hx HTN, DM, Hep C, spinal stenosis s/p anterior cervical fusion on 3/1. Returned on 3/16 with c/o swelling, dysphagia, fever. CT neck in ER revealed abscess and he was taken urgently to OR and remains intubated post op for airway protection.  Patient extubated 3/19. Transferred from [facility] to [facility] 3/20. S/p MBSS today.  SLP recommending NPO status at this time given pharyngeal edema. Noted possible [facility] admission.  Diet Order:  Diet NPO time specified Except for: Ice Chips  Skin:  Wound (see comment) (surgical neck incision)  Last BM:  3/20  Height:   Ht Readings from Last 1 Encounters:  12/20/15 5\' 10"  (1.778 m)    Weight:   Wt Readings from Last 1 Encounters:  12/24/15 232 lb 9.4 oz (105.5 kg)    Ideal Body Weight:  75.5 kg  BMI:  Body mass index is 33.37 kg/(m^2).  Estimated Nutritional Needs:   Kcal:  1900-2100  Protein:  110-120 gm  Fluid:  1.9-2.1 L  EDUCATION NEEDS:   No education needs identified at this time  Maureen ChattersKatie Myha Arizpe, RD, LDN Pager #: [phone] After-Hours Pager #: [phone]

## 2015-12-25 NOTE — Progress Notes (Signed)
[facility]   Reason for visit: Follow up on neck abscess  Interval History:refusing PICC line and IV antibiotics after discharge.  Failed swallow eval.   Physical Exam: Constitutional:  Filed Vitals:   12/25/15 0648 12/25/15 1014  BP: 135/95 139/92  Pulse: 105 105  Temp: 98.4 F (36.9 C) 98.2 F (36.8 C)  Resp: 16 18   patient appears in NAD, up in chair  Respiratory: Normal respiratory effort; CTA B Cardiovascular: RRR  Review of Systems: Constitutional: negative for fevers, chills and anorexia Gastrointestinal: negative for diarrhea Musculoskeletal: negative for myalgias and arthralgias  Lab Results  Component Value Date   WBC 6.6 12/23/2015   HGB 11.6* 12/23/2015   HCT 36.0* 12/23/2015   MCV 87.0 12/23/2015   PLT 158 12/23/2015    Lab Results  Component Value Date   CREATININE 0.67 12/25/2015   BUN <5* 12/25/2015   NA 137 12/25/2015   K 2.9* 12/25/2015   CL 100* 12/25/2015   CO2 26 12/25/2015    Lab Results  Component Value Date   ALT 328* 12/21/2015   AST 194* 12/21/2015   ALKPHOS 64 12/21/2015     Microbiology: Recent Results (from the past 240 hour(s))  Anaerobic culture     Status: None   Collection Time: 12/20/15 10:20 AM  Result Value Ref Range Status   Specimen Description WOUND  Final   Special Requests CERVICAL WOUND  Final   Gram Stain   Final    MODERATE WBC PRESENT,BOTH PMN AND MONONUCLEAR NO SQUAMOUS EPITHELIAL CELLS SEEN NO ORGANISMS SEEN Performed at [facility]    Culture   Final    NO ANAEROBES ISOLATED Performed at [facility]    Report Status 12/25/2015 FINAL  Final  Wound culture     Status: None   Collection Time: 12/20/15 10:20 AM  Result Value Ref Range Status   Specimen Description WOUND  Final   Special Requests CERVICAL WOUND  Final   Gram Stain   Final    MODERATE WBC PRESENT,BOTH PMN AND MONONUCLEAR NO SQUAMOUS EPITHELIAL CELLS SEEN NO ORGANISMS SEEN Performed  at [facility]    Culture   Final    NO GROWTH 2 DAYS Performed at [facility]    Report Status 12/22/2015 FINAL  Final  MRSA PCR Screening     Status: None   Collection Time: 12/20/15 12:44 PM  Result Value Ref Range Status   MRSA by PCR NEGATIVE NEGATIVE Final    Comment:        The GeneXpert MRSA Assay (FDA approved for NASAL specimens only), is one component of a comprehensive MRSA colonization surveillance program. It is not intended to diagnose MRSA infection nor to guide or monitor treatment for MRSA infections.     Impression/Plan:  1. Post op infection - culture remains negative. I highly recommended IV antibiotics via picc line.  I discussed this with him, chance of relapse if not treated adequately.   Would use vancomycin and ceftriaxone, pending culture.   He is going to discuss with his wife whether or not to get a picc line and do IV antibiotics as below.  If he decides not to do IV, please call and I will change his regimen, though he understands this could be suboptimal.   Treat through at least April 27th Looks like he will be going to rehab vanocmyicn trough 15-20 Ceftriaxone 2 grams daily Twice weekly bmp, vancomycin trough Weekly  cbc Will place picc line  2. Rehab - difficulty moving his arms.  As above, to get rehab.  We should see him in our clinic after rehab discharge

## 2015-12-25 NOTE — Progress Notes (Signed)
Speech Pathology:  MBS completed; report to follow in imaging. Pt with posterior pharyngeal edema leading to poor laryngeal closure and penetration/aspiration of thin and nectar-thick liquids.  Recommend NPO with temporary enteral feeding until edema subsides.    Nysha Koplin L. Samson Fredericouture, [place] CCC/SLP Pager [phone]

## 2015-12-25 NOTE — Progress Notes (Signed)
Patient ID: William [name], male   DOB: May 21, 1956, 60 y.o.   MRN: [id number] Stable. Some swelling in the neck. ba swallow test seen. To rehab

## 2015-12-25 NOTE — Progress Notes (Signed)
I met with pt and his significant other at bedside to discuss a possible inpt rehab admission. They are both in agreement and prefer an inpt rehab admission. Noted issues with PICC and SLP recommending temporary enteral feeding with recommendation for NPO status. I may have an inpt rehab bed available tomorrow once PICC and Enteral feeding addressed. Please advise. I will follow up in the morning. [phone]

## 2015-12-25 NOTE — Progress Notes (Signed)
Dr. Luciana Axeomer Infectious disease paged and informed of patient preferring to not have PICC.   Dr. Andrey CotaStates will see pt. later to day to discuss PICC and therapy.  Will hold PICC order until later.

## 2015-12-25 NOTE — Progress Notes (Signed)
Patient ID: William [name], male   DOB: October 03, 1956, 60 y.o.   MRN: [id number] Stable spoke with wife. Having ba swallow

## 2015-12-25 NOTE — Progress Notes (Signed)
Occupational Therapy Treatment Patient Details Name: William [name] MRN: [id number] DOB: Jul 07, 1956 Today's Date: 12/25/2015    History of present illness William [name] on 12/05/15 had a 4 level acdf. Woke up with c5 radiculopathy. Saw rehab medicine. Since then he has been complaining of swelling, unable to swallow,fever, redness of neck area and chest. Seen in the er , a ct neck showed most likely an abscess 3/16 pt underwent cervical exploration with removal of hematoma and cleaning out of abcess.   OT comments  Pt participated in AAROM bil. UEs.  He is very motivated.  Strength appears slightly improved   Follow Up Recommendations  CIR    Equipment Recommendations  Tub/shower seat    Recommendations for Other Services      Precautions / Restrictions Precautions Precautions: Cervical Required Braces or Orthoses: Cervical Brace       Mobility Bed Mobility                  Transfers                      Balance                                   ADL                                                Vision                     Perception     Praxis      Cognition   Behavior During Therapy: WFL for tasks assessed/performed Overall Cognitive Status: Within Functional Limits for tasks assessed                       Extremity/Trunk Assessment               Exercises General Exercises - Upper Extremity Shoulder Flexion: AAROM;Right;Left;20 reps;10 reps;Seated Shoulder Extension: AAROM;Right;Left;10 reps;20 reps;Seated Shoulder ABduction: AAROM;Right;Left;10 reps;20 reps;Seated Shoulder ADduction: AAROM;Right;Left;10 reps;20 reps;Seated Elbow Flexion: AAROM;Right;Left;10 reps;20 reps;Seated Elbow Extension: AAROM;Right;Left;10 reps;20 reps;Seated Hand Exercises Forearm Supination: AROM;Right;Left;10 reps;20 reps;Seated Forearm Pronation: AROM;Right;Left;10 reps;20 reps;Seated   Shoulder Instructions        General Comments      Pertinent Vitals/ Pain       Pain Assessment: No/denies pain  Home Living                                          Prior Functioning/Environment              Frequency Min 3X/week     Progress Toward Goals  OT Goals(current goals can now be found in the care plan section)  Progress towards OT goals: Progressing toward goals  ADL Goals Pt Will Perform Eating: with mod assist;sitting Pt Will Perform Grooming: with mod assist;sitting Pt Will Perform Upper Body Bathing: with min assist;sitting Pt Will Perform Lower Body Bathing: with min assist;sit to/from stand Pt Will Transfer to Toilet: with supervision;ambulating;regular height toilet Pt Will Perform Tub/Shower Transfer: Tub transfer;with modified independence;ambulating Pt/caregiver will Perform Home Exercise Program: Increased ROM;Increased strength;Both right and left upper  extremity;With written HEP provided Additional ADL Goal #1: Pt will independently demonstrate use of AE and compensatory techniques in order to assist with UB bathing/dressing  Plan Discharge plan remains appropriate    Co-evaluation                 End of Session     Activity Tolerance Patient tolerated treatment well   Patient Left in bed;with call bell/phone within reach;with bed alarm set   Nurse Communication Mobility status        Time: [id number] OT Time Calculation (min): 28 min  Charges: OT General Charges $OT Visit: 1 Procedure OT Treatments $Therapeutic Exercise: 23-37 mins  William [name] M 12/25/2015, 5:25 PM

## 2015-12-26 LAB — GLUCOSE, CAPILLARY
GLUCOSE-CAPILLARY: 132 mg/dL — AB (ref 65–99)
GLUCOSE-CAPILLARY: 169 mg/dL — AB (ref 65–99)
GLUCOSE-CAPILLARY: 126 mg/dL — AB (ref 65–99)
GLUCOSE-CAPILLARY: 145 mg/dL — AB (ref 65–99)
Glucose-Capillary: 128 mg/dL — ABNORMAL HIGH (ref 65–99)
Glucose-Capillary: 154 mg/dL — ABNORMAL HIGH (ref 65–99)

## 2015-12-26 NOTE — Progress Notes (Signed)
I contacted Dr. Jeral FruitBotero to request temporary enteral feeding as recommended by SLP. If tube placed, we can then plan admit to inpt rehab today. I spoke with assigned RN and she is aware. I will follow up this morning. [phone]

## 2015-12-26 NOTE — PMR Pre-admission (Addendum)
PMR Admission Coordinator Pre-Admission Assessment  Patient: William [name] is an 60 y.o., male MRN: [id number] DOB: Feb 12, 1956 Height:  (177.8 cm) Weight: 105.5 kg (232 lb 9.4 oz)              Insurance Information HMO:     PPO:      PCP:      IPA:      80/20:      OTHER:  PRIMARY: Medicaid [employer]      Policy#: [id number]      Subscriber: pt Benefits:  Phone #: [phone]     Name: 12/26/15 Eff. Date: active       Medicaid Application Date:       Case Manager:  Disability Application Date:       Case Worker:   Emergency Contact Information Contact Information    Name Relation Home Work Mobile   [place] Friend [phone]       Current Medical History  Patient Admitting Diagnosis: central cord syndrome  History of Present Illness: Zacharia Sowles is a 60 y.o. male with history of HTN, DMT2, Hep C, cervical stenosis with myelopathy s/p ACDF C3-C7 12/05/15 with post post op near complete loss of shoulder abduction and marked bilateral biceps weakness but improvement in overall neurologic deficits. He was admitted on 12/20/15 with neck swelling, inability to swallow, fever and erythema of neck/chest. CT neck done revealing large abscess 14 cm X 6 cm from skin surface to hardware with edema causing severe airway compromise. He was taken to OR for exploration of wound with drainage of abscess and irrigation of esophagus by Dr. [name] on the same day. He remained intubated thorough 3/19 and respiratory status has been stable post extubation. ID consulted for input on antibiotic regimen and recommended continuing Vancomycin/Ceftriaxone till April 27th with twice weekly BMP and Vanc trough. Patient continues to have BUE weakness affecting ability to carry out ADL tasks. Pt failed swallow eval 3/21 and made NPO. Plan for Cortrak enteral feeding tube placement 3/23 and placement confirmed by xray  prior to CIR admit. Plans for IV antibiotics per ID recommendations. Pt wants to  delay PICC placement until further discussions with ID to follow.  Past Medical History  Past Medical History  Diagnosis Date  . Hypertension   . Diabetes mellitus without complication (HCC)   . Chronic hepatitis C without hepatic coma (HCC)   . History of cocaine use   . Depression   . Arthritis   . History of TIA (transient ischemic attack)   . Hyperlipidemia   . History of migraine headaches   . Obesity, Class II, BMI 35-39.9   . BPH with obstruction/lower urinary tract symptoms   . Balanitis   . Urinary retention   . Tobacco abuse     Family History  family history includes Diabetes in his father and mother; Heart disease in his father and mother; Hypertension in his father and mother; Thyroid disease in his mother.  Prior Rehab/Hospitalizations:  Has the patient had major surgery during 100 days prior to admission? Yes  Current Medications   Current facility-administered medications:  .  0.9 %  sodium chloride infusion, , Intravenous, Continuous, Leslye Peer, MD, Last Rate: 10 mL/hr at 12/23/15 0830 .  acetaminophen (TYLENOL) tablet 650 mg, 650 mg, Oral, Q4H PRN **OR** acetaminophen (TYLENOL) suppository 650 mg, 650 mg, Rectal, Q4H PRN, Hilda Lias, MD .  amLODipine (NORVASC) tablet 10 mg, 10 mg, Oral, Daily, Lupita Leash, MD, 10 mg  at 12/25/15 0916 .  cefTRIAXone (ROCEPHIN) 2 g in dextrose 5 % 50 mL IVPB, 2 g, Intravenous, Q24H, Gardiner Barefootobert W Comer, MD, 2 g at 12/26/15 1013 .  fentaNYL (SUBLIMAZE) injection 25 mcg, 25 mcg, Intravenous, Q2H PRN, Vilinda BlanksWilliam S Minor, NP, 25 mcg at 12/27/15 0921 .  gabapentin (NEURONTIN) capsule 100 mg, 100 mg, Oral, TID, Nelda Bucksaniel J Feinstein, MD, 100 mg at 12/25/15 0916 .  heparin injection 5,000 Units, 5,000 Units, Subcutaneous, 3 times per day, Nelda Bucksaniel J Feinstein, MD, 5,000 Units at 12/26/15 2206 .  HYDROmorphone (DILAUDID) injection 0.25-0.5 mg, 0.25-0.5 mg, Intravenous, Q5 min PRN, Gaynelle AduWilliam Fitzgerald, MD .  insulin aspart (novoLOG)  injection 0-15 Units, 0-15 Units, Subcutaneous, 6 times per day, Rahul P Desai, PA-C, 3 Units at 12/27/15 1247 .  insulin glargine (LANTUS) injection 25 Units, 25 Units, Subcutaneous, QHS, Nelda Bucksaniel J Feinstein, MD, 25 Units at 12/26/15 2350 .  lactulose (CHRONULAC) 10 GM/15ML solution 30 g, 30 g, Oral, BID, Nelda Bucksaniel J Feinstein, MD, 30 g at 12/25/15 0916 .  menthol-cetylpyridinium (CEPACOL) lozenge 3 mg, 1 lozenge, Oral, PRN **OR** phenol (CHLORASEPTIC) mouth spray 1 spray, 1 spray, Mouth/Throat, PRN, Hilda LiasErnesto Botero, MD .  morphine 2 MG/ML injection 1-4 mg, 1-4 mg, Intravenous, Q3H PRN, Hilda LiasErnesto Botero, MD .  ondansetron [facility](ZOFRAN) injection 4 mg, 4 mg, Intravenous, Q4H PRN, Hilda LiasErnesto Botero, MD, 4 mg at 12/27/15 0920 .  ondansetron (ZOFRAN) injection 4 mg, 4 mg, Intravenous, Q6H PRN, Vilinda BlanksWilliam S Minor, NP, 4 mg at 12/23/15 1048 .  pantoprazole (PROTONIX) EC tablet 40 mg, 40 mg, Oral, Daily, Herby AbrahamMichelle T Bell, RPH, 40 mg at 12/25/15 0916 .  potassium chloride SA (K-DUR,KLOR-CON) CR tablet 20 mEq, 20 mEq, Oral, BID, Hilda LiasErnesto Botero, MD, 20 mEq at 12/25/15 2255 .  promethazine (PHENERGAN) injection 12.5 mg, 12.5 mg, Intravenous, Q4H PRN, Vilinda BlanksWilliam S Minor, NP, 12.5 mg at 12/26/15 2351 .  vancomycin (VANCOCIN) IVPB 1000 mg/200 mL premix, 1,000 mg, Intravenous, Q8H, Herby AbrahamMichelle T Bell, RPH, 1,000 mg at 12/27/15 1045  Patients Current Diet: Diet NPO time specified Except for: Ice Chips  Cortrak enteral feeding placement pending 12/26/15  Precautions / Restrictions Precautions Precautions: Cervical Precaution Comments: Pt is wearing no brace in bed and applies with sitting up in bed Cervical Brace: Soft collar, At all times Restrictions Weight Bearing Restrictions: No   Has the patient had 2 or more falls or a fall with injury in the past year?No  Prior Activity Level Limited Community (1-2x/wk): Disabled but independent prior to first surgery. No license so did not drive  Journalist, newspaperHome Assistive Devices / Equipment Home  Assistive Devices/Equipment: CBG Meter Home Equipment: Crutches, Bedside commode, Shower seat (shower seat does not fit in tub)  Prior Device Use: Indicate devices/aids used by the patient prior to current illness, exacerbation or injury? None of the above  Prior Functional Level Prior Function Level of Independence: Needs assistance (since first surgery) Gait / Transfers Assistance Needed: pt hel onto his fiance to mobilize since first surgery ADL's / Homemaking Assistance Needed: Since sx on 12/05/2015 pt has been totally dependent with basic ADLs and IADLs Communication / Swallowing Assistance Needed: yes Comments: Per pt report was on disability for Lt knee arthritis and previously because of brain tumor (from chart 2 weeks ago)  Self Care: Did the patient need help bathing, dressing, using the toilet or eating?  Independent. Was independent prior to initial ACDF.  Indoor Mobility: Did the patient need assistance with walking from room to room (with or without  device)? Independent  Stairs: Did the patient need assistance with internal or external stairs (with or without device)? Independent  Functional Cognition: Did the patient need help planning regular tasks such as shopping or remembering to take medications? Independent  Current Functional Level Cognition  Overall Cognitive Status: Within Functional Limits for tasks assessed Orientation Level: Oriented X4    Extremity Assessment (includes Sensation/Coordination)  Upper Extremity Assessment:  (BUEs not functional at this time due to weakness esp noted in shoulder elevation, bicep flexion,.) RUE Deficits / Details: Pt able to elevate shoulder against gravity. Limited elbow and shoulder flexion against gravity. Full activeextension, wrist and hand WFL. PROM overall WFL. Good grip strength. Strength 2-/5 shoulder and elbow flexion/ext, elbow flex/ext. No changes in sensation reported. Fine motor coordination WFL. RUE Coordination:  decreased gross motor LUE Deficits / Details: Pt able to elevate shoulder against gravity. Cannot flex elbow or shoulder against gravity without total A with tendency to shoulder elevate to try to achieve these movements. Full active extension for shoulder and elbow extension when weight of arm held. Decreased forearm supinatin, full AROM for pronation. WFL wrist flexion/extension and grip./ext, elbow flex/ext. LUE Coordination: decreased gross motor  Lower Extremity Assessment: LLE deficits/detail LLE Deficits / Details: Grossly ~4/5 throughout with giveway during MMT.     ADLs  Overall ADL's : Needs assistance/impaired Eating/Feeding: Maximal assistance, Sitting Eating/Feeding Details (indicate cue type and reason): Pt feed himself 75% of his meal with increased time with weight of arm fully supported and him focusing on bringing food/drink to mouth NOT bringing mouth to food/drink. It took him approximately 35 minutes to eat 3/4 of Jello and drink 1/3 of his liquids Grooming: Wash/dry hands, Wash/dry face, Sitting (shaving) Grooming Details (indicate cue type and reason): Pt max A with full weight of arm supported to wash face with RUE. Pt max A with LUE and RUE full weight of arm supported to shave with electric razor and safety razor half of his face/neck. Pt able to use gel for hands once gel put in his hands and hands in lap (not able to raise arms off of his lap independently Lower Body Dressing Details (indicate cue type and reason): Pt able to cross legs to get to feet. He could doff right sock and don 1/2 way with right sock (once it was halfway started on his foot) with LUE. He could only have doff and don left sock with RUE General ADL Comments: Pt currently total A for all basic ADLs due to decreased use of his arms, sit<>stand min A    Mobility  Overal bed mobility: Needs Assistance Bed Mobility: Supine to Sit, Sit to Supine Supine to sit: Min guard Sit to supine: Min guard General  bed mobility comments: close guard for safety. Increased time. HOB elevated. Limited use of UEs due to weakness.     Transfers  Overall transfer level: Needs assistance Equipment used: None, 4-wheeled walker Transfers: Sit to/from Stand Sit to Stand: Min guard Stand pivot transfers: Min guard General transfer comment: close guard for safety. Limited use from UEs due to weakness.     Ambulation / Gait / Stairs / Wheelchair Mobility  Ambulation/Gait Ambulation/Gait assistance: Hydrographic surveyor (Feet): 200 Feet Assistive device: 4-wheeled walker Gait Pattern/deviations: Step-through pattern, Decreased stride length General Gait Details: close guard for safety. 4 wheeled walker appears to be helpful. Pt reports he may have to have surgery on L knee at some point.  Gait velocity: reduced Gait velocity interpretation:  Below normal speed for age/gender    Posture / Balance Balance Overall balance assessment: Needs assistance Sitting-balance support: Feet supported, No upper extremity supported Sitting balance-Leahy Scale: Good Standing balance support: During functional activity Standing balance-Leahy Scale: Fair Standing balance comment: fair    Special needs/care consideration Skin surgical incision Bowel mgmt: incontinent new this admission Bladder mgmt: some incontinence new this admission Cortrak enteral feeding placement pending 12/26/15 Yonker suction prn at bedside Cervical soft collar    Previous Home Environment Living Arrangements: Spouse/significant other (fiance Trish)  Lives With: Significant other Available Help at Discharge: Family, Available 24 hours/day Type of Home: Apartment Home Layout: One level Home Access: Level entry Bathroom Shower/Tub: Engineer, manufacturing systems: Standard Bathroom Accessibility: Yes How Accessible: Accessible via walker Home Care Services: No  Discharge Living Setting Plans for Discharge Living Setting: Patient's  home, Lives with (comment) (fiance, Trish) Type of Home at Discharge: Apartment Discharge Home Layout: One level Discharge Home Access: Level entry Discharge Bathroom Shower/Tub: Tub/shower unit Discharge Bathroom Toilet: Standard Discharge Bathroom Accessibility: Yes How Accessible: Accessible via walker Does the patient have any problems obtaining your medications?: No  Social/Family/Support Systems Patient Roles: Partner Contact Information: Elease Hashimoto "Cabin crew, fiance Anticipated Caregiver: fiance Anticipated Caregiver's Contact Information: see above Ability/Limitations of Caregiver: no limitations Caregiver Availability: 24/7 Discharge Plan Discussed with Primary Caregiver: Yes Is Caregiver In Agreement with Plan?: Yes Does Caregiver/Family have Issues with Lodging/Transportation while Pt is in Rehab?: No (Trish stays with pt in hospital 24/7)  Goals/Additional Needs Patient/Family Goal for Rehab: supervision with PT, OT, and SLP Expected length of stay: ELOS 10-14 days Dietary Needs: NPO with Cortrak enteral feeding Pt/Family Agrees to Admission and willing to participate: Yes Program Orientation Provided & Reviewed with Pt/Caregiver Including Roles  & Responsibilities: Yes  Decrease burden of Care through IP rehab admission: n/a  Possible need for SNF placement upon discharge: not anticipated  Patient Condition: This patient's medical and functional status has changed since the consult dated: 12/24/2015 in which the Rehabilitation Physician determined and documented that the patient's condition is appropriate for intensive rehabilitative care in an inpatient rehabilitation facility. See "History of Present Illness" (above) for medical update. Functional changes are: min to mod assist. Patient's medical and functional status update has been discussed with the Rehabilitation physician and patient remains appropriate for inpatient rehabilitation. Will admit to inpatient rehab  today.  Preadmission Screen Completed By:  Clois Dupes, 12/27/2015 2:26 PM ______________________________________________________________________   Discussed status with Dr.Kirsteins  on 12/27/2015 at  1425 and received telephone approval for admission today.  Admission Coordinator:  Clois Dupes, time 1610 Date 12/27/2015.

## 2015-12-26 NOTE — Clinical Social Work Note (Signed)
CSW received referral for SNF.  Case discussed with case manager, and plan is to discharge to inpatient rehab.  CSW to sign off please re-consult if social work needs arise.  Dyasia Firestine R. Aryn Kops, MSW, LCSWA [phone]  

## 2015-12-26 NOTE — Progress Notes (Signed)
RN attempting to facilitate feeding tube placement. I am unable to admit pt to inpt rehab until feeding route obtained. I will follow up today. Admission may be postponed. [phone]

## 2015-12-26 NOTE — Progress Notes (Signed)
I met with pt and his fiance at bedside to explain that we will postpone admission to inpt rehab today due to no means of nutrition. Will plan admit tomorrow after tube placement. Fiance then asked me to look at pt's neck for he is complaining of more swelling. I alerted Manus Gunning, RN of pt and fiance's concerns for I have no comparison to how his neck has looked earlier. I will follow up tomorrow.[phone]

## 2015-12-26 NOTE — Progress Notes (Signed)
Per floor RN, patient wants to wait a couple more days to place picc line

## 2015-12-26 NOTE — Progress Notes (Signed)
Physical Therapy Treatment Patient Details Name: William [name] MRN: [id number] DOB: 04-23-1956 Today's Date: 12/26/2015    History of Present Illness Mr Sanderfer on 12/05/15 had a 4 level acdf. Woke up with c5 radiculopathy. Saw rehab medicine. Since then he has been complaining of swelling, unable to swallow,fever, redness of neck area and chest. Seen in the er , a ct neck showed most likely an abscess 3/16 pt underwent cervical exploration with removal of hematoma and cleaning out of abcess.    PT Comments    Pt is getting up to walk with minor help but very quickly fatigues and begins to look like a higher fall risk.  He is motivated to get home and will need to be able to stand alone and walk up steps to succeed.  Wife in attendance to see how he is progressing and will expect her to begin to transition in after inpt rehab.  Follow Up Recommendations  CIR     Equipment Recommendations  None recommended by PT    Recommendations for Other Services Rehab consult     Precautions / Restrictions Precautions Precautions: Cervical Precaution Comments: Pt is wearing no brace in bed and applies with sitting up in bed Required Braces or Orthoses: Cervical Brace Cervical Brace: Soft collar;At all times Restrictions Weight Bearing Restrictions: No    Mobility  Bed Mobility Overal bed mobility: Needs Assistance Bed Mobility: Supine to Sit;Sit to Supine     Supine to sit: Min guard Sit to supine: Min guard   General bed mobility comments: HOB flat and used cues to sequence sitting up and to get legs onto bed  Transfers Overall transfer level: Needs assistance Equipment used: 4-wheeled walker (at urging of wife versus using regular walker) Transfers: Sit to/from [employer] Sit to Stand: Min guard;Min assist Stand pivot transfers: Min guard       General transfer comment: Pt is using hands on bed to assist the activity  Ambulation/Gait Ambulation/Gait assistance:  Min guard;Min assist Ambulation Distance (Feet): 250 Feet (with a sitting rest on rollator at half way point) Assistive device: 4-wheeled walker Gait Pattern/deviations: Step-through pattern;Decreased stride length;Ataxic;Wide base of support Gait velocity: reduced Gait velocity interpretation: Below normal speed for age/gender General Gait Details: L knee more controlled on walker and pt mainly fatigued with walking   Stairs            Wheelchair Mobility    Modified Rankin (Stroke Patients Only)       Balance     Sitting balance-Leahy Scale: Good       Standing balance-Leahy Scale: Fair Standing balance comment: fair                    Cognition Arousal/Alertness: Awake/alert Behavior During Therapy: WFL for tasks assessed/performed Overall Cognitive Status: Within Functional Limits for tasks assessed                      Exercises General Exercises - Lower Extremity Ankle Circles/Pumps: AROM;Both;10 reps Quad Sets: AROM;Both;10 reps Gluteal Sets: AROM;Both;10 reps Heel Slides: AROM;Both;10 reps Hip ABduction/ADduction: AROM;Both;10 reps    General Comments General comments (skin integrity, edema, etc.): Pt is safe with short range gait and noted progressively increasing fall risk with the fatigue of BLE's even on Rollator.   Required sitting rest for his sfaety      Pertinent Vitals/Pain Pain Assessment: No/denies pain    Home Living   Living Arrangements: Spouse/significant other (fiance Trish)  Prior Function Level of Independence: Needs assistance (since first surgery)  Gait / Transfers Assistance Needed: pt hel onto his fiance to mobilize since first surgery       PT Goals (current goals can now be found in the care plan section) Acute Rehab PT Goals Patient Stated Goal: to be independent again Progress towards PT goals: Progressing toward goals    Frequency  Min 3X/week    PT Plan Current plan  remains appropriate    Co-evaluation             End of Session Equipment Utilized During Treatment: Gait belt;Cervical collar Activity Tolerance: Patient tolerated treatment well Patient left: in bed;with call bell/phone within reach;with bed alarm set     Time: 1359-1431 PT Time Calculation (min) (ACUTE ONLY): 32 min  Charges:  $Gait Training: 8-22 mins $Therapeutic Exercise: 8-22 mins                    G Codes:      Ivar DrapeStout, Cesar Alf E 12/26/2015, 3:14 PM   Samul Dadauth Valgene Deloatch, PT MS Acute Rehab Dept. Number: [facility] [id number] and [facility] [phone]

## 2015-12-26 NOTE — Progress Notes (Signed)
Cortrak Tube Team Note:  Notified by Unit Director that [facility] on schedule is unavailable for tube placement. Per pt RN IR declined to place tube until Cortrak tube attempted. On arrival to unit pt RN and CIR coordinator discussing patient who is in need of feeding tube placement prior to rehab admission. Offered to attempt Cortrak tube placement however pt complaining that his neck is more swollen and feels different inside. After discussion with RN will defer tube placement until Neurosurgeon has been able to evaluate patient for changes in exam. RN will notify Cortrak tube team once pt is cleared.   Kendell BaneHeather Craigory Toste RD, LDN, CNSC [phone] Pager [phone] After Hours Pager

## 2015-12-26 NOTE — Progress Notes (Signed)
Speech Language Pathology Treatment: Dysphagia  Patient Details Name: William [name] MRN: [id number] DOB: Apr 18, 1956 Today's Date: 12/26/2015 Time: [id number] SLP Time Calculation (min) (ACUTE ONLY): 23 min  Assessment / Plan / Recommendation Clinical Impression  F/u for dysphagia.  Wife present; completed education; reviewed MBS video and explained nature of dysphagia, aspiration precautions, and prognosis.  Cortrack to be placed today and D/C to CIR.  Pt provided with ice chips - continues to present with clinical symptoms of dysphagia c/b cough, wet phonation, multiple subswallows per bolus. Discussed importance of oral care.  SLP f/u on CIR recommended.   HPI HPI: 60yo male smoker with hx HTN, DM, Hep C, spinal stenosis s/p anterior cervical fusion on 3/1. Had C5 radiculopathy following surgery then went to rehab. Returned on 3/16 with c/o swelling, dysphagia, fever. CT neck in ER revealed large surgical site abscess extending from skin to hardware. He was taken urgently to OR 3/16 and remained intubated post op for airway protection. Extubated 3/19. "Difficult airway" noted regarding intubation.       SLP Plan  Continue with current plan of care     Recommendations  Diet recommendations: NPO (ice chips) Medication Administration: Via alternative means             Oral Care Recommendations: Oral care QID Follow up Recommendations: Inpatient Rehab Plan: Continue with current plan of care     GO               William [name], [place] CCC/SLP Pager [phone]  William [name], William [name] 12/26/2015, 10:50 AM

## 2015-12-26 NOTE — Progress Notes (Signed)
Patient ID: William [name], male   DOB: 02-04-1956, 60 y.o.   MRN: [id number] Stable. Ng tube for feeding. To be transferred to rehab

## 2015-12-26 NOTE — Progress Notes (Signed)
Await feeding tube placement and once placed can admit pt to inpt rehab today. RN coordinating placement. I will make the arrangements to admit today. [phone]

## 2015-12-27 ENCOUNTER — Inpatient Hospital Stay (HOSPITAL_COMMUNITY): Payer: Medicaid Other

## 2015-12-27 ENCOUNTER — Inpatient Hospital Stay (HOSPITAL_COMMUNITY)
Admission: RE | Admit: 2015-12-27 | Discharge: 2016-01-04 | DRG: 091 | Disposition: A | Discharge status: Home Health | Payer: Medicaid Other | Source: Intra-hospital | Attending: Physical Medicine & Rehabilitation | Admitting: Physical Medicine & Rehabilitation

## 2015-12-27 DIAGNOSIS — G959 Disease of spinal cord, unspecified: Principal | ICD-10-CM | POA: Diagnosis present

## 2015-12-27 DIAGNOSIS — Z981 Arthrodesis status: Secondary | ICD-10-CM

## 2015-12-27 DIAGNOSIS — IMO0002 Reserved for concepts with insufficient information to code with codable children: Secondary | ICD-10-CM

## 2015-12-27 DIAGNOSIS — M2550 Pain in unspecified joint: Secondary | ICD-10-CM

## 2015-12-27 DIAGNOSIS — N138 Other obstructive and reflux uropathy: Secondary | ICD-10-CM | POA: Diagnosis present

## 2015-12-27 DIAGNOSIS — E440 Moderate protein-calorie malnutrition: Secondary | ICD-10-CM | POA: Diagnosis not present

## 2015-12-27 DIAGNOSIS — E785 Hyperlipidemia, unspecified: Secondary | ICD-10-CM | POA: Diagnosis present

## 2015-12-27 DIAGNOSIS — M5412 Radiculopathy, cervical region: Secondary | ICD-10-CM

## 2015-12-27 DIAGNOSIS — T814XXS Infection following a procedure, sequela: Secondary | ICD-10-CM

## 2015-12-27 DIAGNOSIS — M179 Osteoarthritis of knee, unspecified: Secondary | ICD-10-CM | POA: Diagnosis present

## 2015-12-27 DIAGNOSIS — E669 Obesity, unspecified: Secondary | ICD-10-CM | POA: Diagnosis present

## 2015-12-27 DIAGNOSIS — R1310 Dysphagia, unspecified: Secondary | ICD-10-CM | POA: Diagnosis present

## 2015-12-27 DIAGNOSIS — G061 Intraspinal abscess and granuloma: Secondary | ICD-10-CM | POA: Diagnosis present

## 2015-12-27 DIAGNOSIS — S14129S Central cord syndrome at unspecified level of cervical spinal cord, sequela: Secondary | ICD-10-CM | POA: Diagnosis not present

## 2015-12-27 DIAGNOSIS — B182 Chronic viral hepatitis C: Secondary | ICD-10-CM | POA: Diagnosis present

## 2015-12-27 DIAGNOSIS — E119 Type 2 diabetes mellitus without complications: Secondary | ICD-10-CM | POA: Diagnosis present

## 2015-12-27 DIAGNOSIS — M4712 Other spondylosis with myelopathy, cervical region: Secondary | ICD-10-CM

## 2015-12-27 DIAGNOSIS — F329 Major depressive disorder, single episode, unspecified: Secondary | ICD-10-CM | POA: Diagnosis present

## 2015-12-27 DIAGNOSIS — R1314 Dysphagia, pharyngoesophageal phase: Secondary | ICD-10-CM | POA: Diagnosis not present

## 2015-12-27 DIAGNOSIS — Z8673 Personal history of transient ischemic attack (TIA), and cerebral infarction without residual deficits: Secondary | ICD-10-CM

## 2015-12-27 DIAGNOSIS — Z87891 Personal history of nicotine dependence: Secondary | ICD-10-CM

## 2015-12-27 DIAGNOSIS — E1151 Type 2 diabetes mellitus with diabetic peripheral angiopathy without gangrene: Secondary | ICD-10-CM

## 2015-12-27 DIAGNOSIS — E1165 Type 2 diabetes mellitus with hyperglycemia: Secondary | ICD-10-CM

## 2015-12-27 DIAGNOSIS — I10 Essential (primary) hypertension: Secondary | ICD-10-CM | POA: Diagnosis present

## 2015-12-27 DIAGNOSIS — N401 Enlarged prostate with lower urinary tract symptoms: Secondary | ICD-10-CM | POA: Diagnosis present

## 2015-12-27 DIAGNOSIS — G8929 Other chronic pain: Secondary | ICD-10-CM

## 2015-12-27 DIAGNOSIS — L0211 Cutaneous abscess of neck: Secondary | ICD-10-CM | POA: Diagnosis not present

## 2015-12-27 DIAGNOSIS — S14129D Central cord syndrome at unspecified level of cervical spinal cord, subsequent encounter: Secondary | ICD-10-CM

## 2015-12-27 LAB — GLUCOSE, CAPILLARY
GLUCOSE-CAPILLARY: 142 mg/dL — AB (ref 65–99)
GLUCOSE-CAPILLARY: 173 mg/dL — AB (ref 65–99)
Glucose-Capillary: 150 mg/dL — ABNORMAL HIGH (ref 65–99)
Glucose-Capillary: 184 mg/dL — ABNORMAL HIGH (ref 65–99)
Glucose-Capillary: 170 mg/dL — ABNORMAL HIGH (ref 65–99)

## 2015-12-27 LAB — BASIC METABOLIC PANEL
ANION GAP: 11 (ref 5–15)
BUN: 9 mg/dL (ref 6–20)
CHLORIDE: 102 mmol/L (ref 101–111)
CO2: 27 mmol/L (ref 22–32)
Calcium: 9.3 mg/dL (ref 8.9–10.3)
Creatinine, Ser: 1.07 mg/dL (ref 0.61–1.24)
GFR calc non Af Amer: >60 mL/min (ref >60)
Glucose, Bld: 183 mg/dL — ABNORMAL HIGH (ref 65–99)
POTASSIUM: 3.3 mmol/L — AB (ref 3.5–5.1)
SODIUM: 140 mmol/L (ref 135–145)

## 2015-12-27 LAB — CREATININE, SERUM
Creatinine, Ser: 1.09 mg/dL (ref 0.61–1.24)
GFR calc non Af Amer: >60 mL/min (ref >60)

## 2015-12-27 LAB — VANCOMYCIN, TROUGH
VANCOMYCIN TR: 14 ug/mL (ref 10.0–20.0)
Vancomycin Tr: 20 ug/mL (ref 10.0–20.0)

## 2015-12-27 MED ORDER — ALUM & MAG HYDROXIDE-SIMETH 200-200-20 MG/5ML PO SUSP: 30.0000 mL | ORAL | Status: DC | PRN

## 2015-12-27 MED ORDER — PROCHLORPERAZINE EDISYLATE 5 MG/ML IJ SOLN: 5.0000 mg | Freq: Four times a day (QID) | INTRAMUSCULAR | Status: DC | PRN

## 2015-12-27 MED ORDER — VANCOMYCIN HCL IN DEXTROSE 750-5 MG/150ML-% IV SOLN
750.0000 mg | Freq: Three times a day (TID) | INTRAVENOUS | Status: DC
  Filled 2015-12-27 (×2): qty 150

## 2015-12-27 MED ORDER — HEPARIN SODIUM (PORCINE) 5000 UNIT/ML IJ SOLN
5000.0000 [IU] | Freq: Three times a day (TID) | INTRAMUSCULAR | Status: DC
  Administered 2015-12-28 (×2): 5000 [IU] via SUBCUTANEOUS
  Filled 2015-12-27 (×4): qty 1

## 2015-12-27 MED ORDER — FREE WATER
200.0000 mL | Freq: Three times a day (TID) | Status: DC
  Administered 2015-12-27 – 2016-01-02 (×22): 200 mL

## 2015-12-27 MED ORDER — HEPARIN SODIUM (PORCINE) 5000 UNIT/ML IJ SOLN
5000.0000 [IU] | Freq: Three times a day (TID) | INTRAMUSCULAR | Status: DC
  Administered 2015-12-27 – 2016-01-04 (×22): 5000 [IU] via SUBCUTANEOUS
  Filled 2015-12-27 (×21): qty 1

## 2015-12-27 MED ORDER — ACETAMINOPHEN 325 MG PO TABS: 325.0000 mg | ORAL_TABLET | ORAL | Status: DC | PRN

## 2015-12-27 MED ORDER — PROCHLORPERAZINE MALEATE 5 MG PO TABS: 5.0000 mg | ORAL_TABLET | Freq: Four times a day (QID) | ORAL | Status: DC | PRN

## 2015-12-27 MED ORDER — SACCHAROMYCES BOULARDII 250 MG PO CAPS: 250.0000 mg | ORAL_CAPSULE | Freq: Two times a day (BID) | ORAL | Status: DC

## 2015-12-27 MED ORDER — ACETAMINOPHEN 160 MG/5ML PO SOLN: 325.0000 mg | ORAL | Status: DC | PRN

## 2015-12-27 MED ORDER — INSULIN GLARGINE 100 UNIT/ML ~~LOC~~ SOLN
25.0000 [IU] | Freq: Every day | SUBCUTANEOUS | Status: DC
  Administered 2015-12-27: 25 [IU] via SUBCUTANEOUS
  Filled 2015-12-27 (×2): qty 0.25

## 2015-12-27 MED ORDER — INSULIN ASPART 100 UNIT/ML ~~LOC~~ SOLN
0.0000 [IU] | SUBCUTANEOUS | Status: DC
  Administered 2015-12-28: 15 [IU] via SUBCUTANEOUS
  Administered 2015-12-28: 5 [IU] via SUBCUTANEOUS
  Administered 2015-12-28: 8 [IU] via SUBCUTANEOUS
  Administered 2015-12-28: 5 [IU] via SUBCUTANEOUS
  Administered 2015-12-28 (×2): 8 [IU] via SUBCUTANEOUS
  Administered 2015-12-29: 11 [IU] via SUBCUTANEOUS
  Administered 2015-12-29 (×2): 8 [IU] via SUBCUTANEOUS
  Administered 2015-12-29 (×2): 5 [IU] via SUBCUTANEOUS
  Administered 2015-12-30: 11 [IU] via SUBCUTANEOUS
  Administered 2015-12-30: 5 [IU] via SUBCUTANEOUS
  Administered 2015-12-30: 15 [IU] via SUBCUTANEOUS
  Administered 2015-12-30 (×2): 11 [IU] via SUBCUTANEOUS
  Administered 2015-12-31: 8 [IU] via SUBCUTANEOUS
  Administered 2015-12-31: 15 [IU] via SUBCUTANEOUS
  Administered 2015-12-31: 5 [IU] via SUBCUTANEOUS
  Administered 2015-12-31: 15 [IU] via SUBCUTANEOUS
  Administered 2015-12-31: 3 [IU] via SUBCUTANEOUS
  Administered 2015-12-31: 5 [IU] via SUBCUTANEOUS
  Administered 2016-01-01 (×2): 3 [IU] via SUBCUTANEOUS
  Administered 2016-01-01 (×2): 5 [IU] via SUBCUTANEOUS
  Administered 2016-01-01: 11 [IU] via SUBCUTANEOUS
  Administered 2016-01-02 (×3): 5 [IU] via SUBCUTANEOUS

## 2015-12-27 MED ORDER — POTASSIUM CHLORIDE CRYS ER 20 MEQ PO TBCR: 20.0000 meq | EXTENDED_RELEASE_TABLET | Freq: Two times a day (BID) | ORAL | Status: DC

## 2015-12-27 MED ORDER — FLEET ENEMA 7-19 GM/118ML RE ENEM: 1.0000 | ENEMA | Freq: Once | RECTAL | Status: DC | PRN

## 2015-12-27 MED ORDER — VANCOMYCIN HCL IN DEXTROSE 750-5 MG/150ML-% IV SOLN
750.0000 mg | Freq: Three times a day (TID) | INTRAVENOUS | Status: DC
  Administered 2015-12-27 – 2015-12-28 (×4): 750 mg via INTRAVENOUS
  Filled 2015-12-27 (×6): qty 150

## 2015-12-27 MED ORDER — BISACODYL 10 MG RE SUPP: 10.0000 mg | Freq: Every day | RECTAL | Status: DC | PRN

## 2015-12-27 MED ORDER — FLORANEX PO PACK
1.0000 g | PACK | Freq: Two times a day (BID) | ORAL | Status: DC
  Administered 2015-12-27 – 2015-12-30 (×6): 1 g
  Filled 2015-12-27 (×8): qty 1

## 2015-12-27 MED ORDER — LIDOCAINE HCL 2 % EX GEL: CUTANEOUS | Status: DC | PRN

## 2015-12-27 MED ORDER — POTASSIUM CHLORIDE 20 MEQ PO PACK: 20.0000 meq | PACK | Freq: Two times a day (BID) | ORAL | Status: DC

## 2015-12-27 MED ORDER — GUAIFENESIN-DM 100-10 MG/5ML PO SYRP: 5.0000 mL | ORAL_SOLUTION | Freq: Four times a day (QID) | ORAL | Status: DC | PRN

## 2015-12-27 MED ORDER — JEVITY 1.2 CAL PO LIQD
1000.0000 mL | ORAL | Status: DC
  Administered 2015-12-27: 1000 mL
  Filled 2015-12-27: qty 1000
  Filled 2015-12-27: qty 237
  Filled 2015-12-27: qty 1000

## 2015-12-27 MED ORDER — TRAZODONE HCL 50 MG PO TABS
25.0000 mg | ORAL_TABLET | Freq: Every evening | ORAL | Status: DC | PRN
  Administered 2015-12-31 – 2016-01-01 (×2): 50 mg
  Filled 2015-12-27 (×2): qty 1

## 2015-12-27 MED ORDER — GABAPENTIN 250 MG/5ML PO SOLN
100.0000 mg | Freq: Three times a day (TID) | ORAL | Status: DC
  Administered 2015-12-27 – 2016-01-02 (×17): 100 mg
  Filled 2015-12-27 (×19): qty 2

## 2015-12-27 MED ORDER — ONDANSETRON HCL 4 MG/2ML IJ SOLN: 4.0000 mg | INTRAMUSCULAR | Status: DC | PRN

## 2015-12-27 MED ORDER — POTASSIUM CHLORIDE 20 MEQ/15ML (10%) PO SOLN
20.0000 meq | Freq: Two times a day (BID) | ORAL | Status: DC
  Administered 2015-12-27 – 2016-01-02 (×12): 20 meq
  Filled 2015-12-27 (×12): qty 15

## 2015-12-27 MED ORDER — AMLODIPINE BESYLATE 10 MG PO TABS
10.0000 mg | ORAL_TABLET | Freq: Every day | ORAL | Status: DC
  Administered 2015-12-28 – 2016-01-02 (×6): 10 mg
  Filled 2015-12-27 (×6): qty 1

## 2015-12-27 MED ORDER — DEXTROSE 5 % IV SOLN
2.0000 g | INTRAVENOUS | Status: DC
  Administered 2015-12-28 – 2016-01-04 (×8): 2 g via INTRAVENOUS
  Filled 2015-12-27 (×8): qty 2

## 2015-12-27 MED ORDER — PROCHLORPERAZINE 25 MG RE SUPP: 12.5000 mg | Freq: Four times a day (QID) | RECTAL | Status: DC | PRN

## 2015-12-27 MED ORDER — CALCIUM POLYCARBOPHIL 625 MG PO TABS
625.0000 mg | ORAL_TABLET | Freq: Two times a day (BID) | ORAL | Status: DC
  Administered 2015-12-27 – 2016-01-02 (×12): 625 mg
  Filled 2015-12-27 (×12): qty 1

## 2015-12-27 NOTE — H&P (Signed)
Physical Medicine and Rehabilitation Admission H&P   Chief Complaint  Patient presents with  . Central cord syndrome.    HPI: William [name] is a 60 y.o. male with history of HTN, DMT2, Hep C, cervical stenosis with myelopathy s/p ACDF C3-C7 12/05/15 with post post op near complete loss of shoulder abduction and marked bilateral biceps weakness but improvement in overall neurologic deficits. He was admitted on 12/20/15 with neck swelling, inability to swallow, fever and erythema of neck/chest. CT neck done revealing large abscess 14 cm X 6 cm from skin surface to hardware with edema causing severe airway compromise. He was taken to OR for exploration of wound with drainage of abscess and irrigation of esophagus by Dr. Joya Salm on the same day. He remained intubated thorough 3/19 and respiratory status has been stable post extubation. ID was consulted for input on antibiotic regimen and recommended continuing Vancomycin/Ceftriaxone till April 27th with twice weekly BMP and Vanc trough. Patient refusing PICC and IV antibiotics after discharge and concerns regarding relapse discussed by Dr. Linus Salmons.  He has had problems managing secretions and MBS done revealing posterior pharyngeal edema leading to poor laryngeal closure with Penetration/aspiration and NPO recommended. He has been reporting fullness in his throat for past 24 hours but no SOB. NGT placed  Patient continues to have BUE weakness affecting ability to carry out ADL tasks. CIR recommended for follow up therapy.    ROS    Past Medical History  Diagnosis Date  . Hypertension   . Diabetes mellitus without complication ([place])   . Chronic hepatitis C without hepatic coma ([place])   . History of cocaine use   . Depression   . Arthritis   . History of TIA (transient ischemic attack)   . Hyperlipidemia   . History of migraine headaches   . Obesity, Class II, BMI 35-39.9   . BPH with  obstruction/lower urinary tract symptoms   . Balanitis   . Urinary retention   . Tobacco abuse     Past Surgical History  Procedure Laterality Date  . Rotator cuff surgery       left shoulder   . Spider bite      on neck  . Anterior cervical decomp/discectomy fusion N/A 12/05/2015    Procedure: Anterior Cervical Decompression Fusion Cervical three-four, Cervical four-five, Cervical five-six, Cervical six-seven; Surgeon: Earnie Larsson, MD; Location: [facility]; Service: Neurosurgery; Laterality: N/A;  . Anterior cervical decompression for epidural abscess N/A 12/20/2015    Procedure: ANTERIOR CERVICAL DECOMPRESSION FOR EPIDURAL ABSCESS; Surgeon: Leeroy Cha, MD; Location: [place] [facility]; Service: Neurosurgery; Laterality: N/A; ANTERIOR CERVICAL DECOMPRESSION FOR EPIDURAL ABSCESS    Family History  Problem Relation Age of Onset  . Hypertension Mother   . Diabetes Mother   . Heart disease Mother   . Thyroid disease Mother   . Hypertension Father   . Diabetes Father   . Heart disease Father     Social History: Married. Wife at home and patient has been needing assistance with ADL tasks since last surgery. Has been disabled due to knee OA. He reports that he quit smoking couple of months ago. He has been smoking about 3 cigarettes /day since age 67. He has a 3 pack-year smoking history. He does not have any smokeless tobacco history on file. He reports that he drinks alcohol. He reports that he uses illicit drugs   Allergies  Allergen Reactions  . Metformin And Related Other (See Comments)    Possible liver inflammation  .  Prednisone Other (See Comments)    Disoriented   . Tamsulosin Other (See Comments)    Nose bleed  . Other Other (See Comments)    Reports all narcotics make him sick. Pt premeds with Reglan to stop nausea    Medications Prior to  Admission  Medication Sig Dispense Refill  . amLODipine (NORVASC) 10 MG tablet Take 1 tablet (10 mg total) by mouth daily. 30 tablet 3  . BD PEN NEEDLE NANO U/F 32G X 4 MM MISC USE WITH FLEXPEN TO ADMINISTER INSULIN 100 each 5  . cyclobenzaprine (FLEXERIL) 10 MG tablet Take 1 tablet (10 mg total) by mouth 3 (three) times daily as needed for muscle spasms. 60 tablet 3  . diphenhydrAMINE (BENADRYL) 25 MG tablet Take 1 tablet (25 mg total) by mouth every 6 (six) hours. As needed with Compazine for headache. (Patient taking differently: Take 25 mg by mouth every 8 (eight) hours as needed for itching or allergies. As needed with Compazine for headache.) 20 tablet 0  . Elastic Bandages & Supports (THUMB BRACE) MISC 1 Device by Does not apply route daily as needed. 1 each 0  . fluconazole (DIFLUCAN) 150 MG tablet TAKE 1 TABLET (150 MG TOTAL) BY MOUTH EVERY 7 (SEVEN) DAYS. (Patient taking differently: TAKE 1 TABLET (150 MG TOTAL) BY MOUTH EVERY 7 (SEVEN) DAYS on Monday) 4 tablet 0  . gabapentin (NEURONTIN) 100 MG capsule Take 1 capsule (100 mg total) by mouth 3 (three) times daily. 90 capsule 3  . glucose blood (ACCU-CHEK AVIVA) test strip Use as instructed to check blood glucose 4x/day 200 each 12  . insulin aspart (NOVOLOG FLEXPEN) 100 UNIT/ML FlexPen Inject 15 Units into the skin 3 (three) times daily with meals. (Patient taking differently: Inject 25 Units into the skin 3 (three) times daily with meals. ) 8 pen 5  . Insulin Glargine (LANTUS SOLOSTAR) 100 UNIT/ML Solostar Pen Inject 30 Units into the skin 2 (two) times daily. (Patient taking differently: Inject 50 Units into the skin daily at 10 pm. ) 6 pen 2  . Insulin Pen Needle (EASY TOUCH PEN NEEDLES) 32G X 6 MM MISC Use with Flexpen to administer Insulin 100 each 11  . Lancets (FREESTYLE) lancets Use as instructed to check blood glucose 4x/day 200 each 12  . metoCLOPramide (REGLAN) 10 MG  tablet Take 1 tablet (10 mg total) by mouth 3 (three) times daily as needed for nausea (headache / nausea). 20 tablet 0  . oxyCODONE-acetaminophen (PERCOCET/ROXICET) 5-325 MG tablet Take 1-2 tablets by mouth every 6 (six) hours as needed for severe pain. 90 tablet 0  . pantoprazole (PROTONIX) 40 MG tablet Take 1 tablet (40 mg total) by mouth 2 (two) times daily. Twice a day for 10 days; then start using it once a day (Patient taking differently: Take 40 mg by mouth daily. ) 60 tablet 1  . sucralfate (CARAFATE) 1 GM/10ML suspension Take 10 mLs (1 g total) by mouth 4 (four) times daily - with meals and at bedtime. 420 mL 1    Home: Home Living Family/patient expects to be discharged to:: Inpatient rehab Living Arrangements: Spouse/significant other (fiance Trish) Available Help at Discharge: Family, Available 24 hours/day Type of Home: Apartment Home Access: Level entry Home Layout: One level Bathroom Shower/Tub: Chiropodist: Standard Bathroom Accessibility: Yes Home Equipment: Crutches, Bedside commode, Shower seat (shower seat does not fit in tub) Lives With: Significant other  Functional History: Prior Function Level of Independence: Needs assistance (since first surgery)  Gait / Transfers Assistance Needed: pt hel onto his fiance to mobilize since first surgery ADL's / Homemaking Assistance Needed: Since sx on 12/05/2015 pt has been totally dependent with basic ADLs and IADLs Communication / Swallowing Assistance Needed: yes Comments: Per pt report was on disability for Lt knee arthritis and previously because of brain tumor (from chart 2 weeks ago)  Functional Status:  Mobility: Bed Mobility Overal bed mobility: Needs Assistance Bed Mobility: Supine to Sit, Sit to Supine Supine to sit: Min guard Sit to supine: Min guard General bed mobility comments: HOB flat and used cues to sequence sitting up and to get legs onto  bed Transfers Overall transfer level: Needs assistance Equipment used: 4-wheeled walker (at urging of wife versus using regular walker) Transfers: Sit to/from Stand, Risk manager Sit to Stand: Min guard, Min assist Stand pivot transfers: Min guard General transfer comment: Pt is using hands on bed to assist the activity Ambulation/Gait Ambulation/Gait assistance: Min guard, Min assist Ambulation Distance (Feet): 250 Feet (with a sitting rest on rollator at half way point) Assistive device: 4-wheeled walker Gait Pattern/deviations: Step-through pattern, Decreased stride length, Ataxic, Wide base of support General Gait Details: L knee more controlled on walker and pt mainly fatigued with walking Gait velocity: reduced Gait velocity interpretation: Below normal speed for age/gender    ADL: ADL Overall ADL's : Needs assistance/impaired Eating/Feeding: Maximal assistance, Sitting Eating/Feeding Details (indicate cue type and reason): Pt feed himself 75% of his meal with increased time with weight of arm fully supported and him focusing on bringing food/drink to mouth NOT bringing mouth to food/drink. It took him approximately 35 minutes to eat 3/4 of Jello and drink 1/3 of his liquids Grooming: Wash/dry hands, Wash/dry face, Sitting (shaving) Grooming Details (indicate cue type and reason): Pt max A with full weight of arm supported to wash face with RUE. Pt max A with LUE and RUE full weight of arm supported to shave with electric razor and safety razor half of his face/neck. Pt able to use gel for hands once gel put in his hands and hands in lap (not able to raise arms off of his lap independently Lower Body Dressing Details (indicate cue type and reason): Pt able to cross legs to get to feet. He could doff right sock and don 1/2 way with right sock (once it was halfway started on his foot) with LUE. He could only have doff and don left sock with RUE General ADL Comments: Pt  currently total A for all basic ADLs due to decreased use of his arms, sit<>stand min A  Cognition: Cognition Overall Cognitive Status: Within Functional Limits for tasks assessed Orientation Level: Oriented X4 Cognition Arousal/Alertness: Awake/alert Behavior During Therapy: WFL for tasks assessed/performed Overall Cognitive Status: Within Functional Limits for tasks assessed   Blood pressure 124/97, pulse 106, temperature 97.7 F (36.5 C), temperature source Oral, resp. rate 18, height _0  (1.778 m), weight 105.5 kg (232 lb 9.4 oz), SpO2 91 %. Physical Exam HEENT has a nasal feeding tube General: No acute distress Mood and affect are appropriate Heart: Regular rate and rhythm no rubs murmurs or extra sounds Lungs: Clear to auscultation, breathing unlabored, no rales or wheezes Abdomen: Positive bowel sounds, soft nontender to palpation, nondistended Extremities: No clubbing, cyanosis, or edema Skin: No evidence of breakdown, no evidence of rash Neurologic: Cranial nerves II through XII intact, motor strength is 1/5 in bilateral deltoid,2- bicep, 3 tricep, 4 grip,4+ hip flexor, knee extensors, ankle dorsiflexor  and plantar flexor Sensory exam normal sensation to light touch  in bilateral upper and lower extremities Cerebellar exam normal finger to nose to finger as well as heel to shin in bilateral upper and lower extremities Musculoskeletal: Full range of motion in all 4 extremities. No joint swelling, Mild dorsum of the hand swelling/edema, nontender no erythema Deep tendon reflexes are hyporeflexic bilateral upper and lower limbs  Lab Results Last 48 Hours    Results for orders placed or performed during the hospital encounter of 12/20/15 (from the past 48 hour(s))  Glucose, capillary Status: Abnormal   Collection Time: 12/24/15 7:52 PM  Result Value Ref Range   Glucose-Capillary 178 (H) 65 - 99 mg/dL  Glucose, capillary Status: Abnormal   Collection  Time: 12/25/15 12:08 AM  Result Value Ref Range   Glucose-Capillary 208 (H) 65 - 99 mg/dL   Comment 1 Notify RN    Comment 2 Document in Chart   Glucose, capillary Status: Abnormal   Collection Time: 12/25/15 4:08 AM  Result Value Ref Range   Glucose-Capillary 230 (H) 65 - 99 mg/dL   Comment 1 Notify RN    Comment 2 Document in Chart   BMET in AM Status: Abnormal   Collection Time: 12/25/15 5:24 AM  Result Value Ref Range   Sodium 137 135 - 145 mmol/L   Potassium 2.9 (L) 3.5 - 5.1 mmol/L   Chloride 100 (L) 101 - 111 mmol/L   CO2 26 22 - 32 mmol/L   Glucose, Bld 202 (H) 65 - 99 mg/dL   BUN <5 (L) 6 - 20 mg/dL   Creatinine, Ser 0.67 0.61 - 1.24 mg/dL   Calcium 9.0 8.9 - 10.3 mg/dL   GFR calc non Af Amer >60 >60 mL/min   GFR calc Af Amer >60 >60 mL/min    Comment: (NOTE) The eGFR has been calculated using the CKD EPI equation. This calculation has not been validated in all clinical situations. eGFR's persistently <60 mL/min signify possible Chronic Kidney Disease.    Anion gap 11 5 - 15  Glucose, capillary Status: Abnormal   Collection Time: 12/25/15 6:44 AM  Result Value Ref Range   Glucose-Capillary 153 (H) 65 - 99 mg/dL   Comment 1 Notify RN    Comment 2 Document in Chart   Glucose, capillary Status: Abnormal   Collection Time: 12/25/15 7:50 AM  Result Value Ref Range   Glucose-Capillary 148 (H) 65 - 99 mg/dL  Glucose, capillary Status: Abnormal   Collection Time: 12/25/15 11:55 AM  Result Value Ref Range   Glucose-Capillary 240 (H) 65 - 99 mg/dL  Glucose, capillary Status: Abnormal   Collection Time: 12/25/15 4:56 PM  Result Value Ref Range   Glucose-Capillary 201 (H) 65 - 99 mg/dL  Glucose, capillary Status: Abnormal   Collection Time: 12/25/15 8:03 PM  Result Value Ref Range    Glucose-Capillary 230 (H) 65 - 99 mg/dL   Comment 1 Notify RN    Comment 2 Document in Chart   Glucose, capillary Status: Abnormal   Collection Time: 12/25/15 11:53 PM  Result Value Ref Range   Glucose-Capillary 134 (H) 65 - 99 mg/dL  Glucose, capillary Status: Abnormal   Collection Time: 12/26/15 4:06 AM  Result Value Ref Range   Glucose-Capillary 154 (H) 65 - 99 mg/dL   Comment 1 Notify RN    Comment 2 Document in Chart   Glucose, capillary Status: Abnormal   Collection Time: 12/26/15 7:33 AM  Result Value Ref Range  Glucose-Capillary 132 (H) 65 - 99 mg/dL  Glucose, capillary Status: Abnormal   Collection Time: 12/26/15 11:48 AM  Result Value Ref Range   Glucose-Capillary 169 (H) 65 - 99 mg/dL        Medical Problem List and Plan: 1. Cervical myelopathy and bilateral C5 radiculopathy secondary to Cervical spinal stenosis and postoperative abscess 2. DVT Prophylaxis/Anticoagulation: Pharmaceutical: Heparin 3. Pain Management: Discontinue IV pain meds, may start oxycodone 1 mg per mL, 5-10 mL every 4 hours when necessary 4. Mood: Overt signs of depression, will monitor for adjustment disorder, neuropsychologist evaluation as necessary 5. Neuropsych: This patient is capable of making decisions on his own behalf. 6. Skin/Wound Care: monitor wound for healing 7. Fluids/Electrolytes/Nutrition: Monitor I/O. Check lytes in am 8. HTN: Monitor BP bid.  9. DMT2: Monitor BS ac/hs.Continue lantus 25 units at bedtime. Use SSI  10. Neck abscess: To continue Vanc (trough 15-20) and Ceftriaxone 2 grams daily till April 27th-We discussed that he would need to go home on IV antibiotics and a more permanent IV access line would need to be placed   Post Admission Physician Evaluation: Functional deficits secondary to Cervical myelopathy and bilateral C5 radiculopathy secondary to Cervical spinal stenosis and  postoperative abscess 1. . 2. Patient is admitted to receive collaborative, interdisciplinary care between the physiatrist, rehab nursing staff, and therapy team. 3. Patient's level of medical complexity and substantial therapy needs in context of that medical necessity cannot be provided at a lesser intensity of care such as a SNF. 4. Patient has experienced substantial functional loss from his/her baseline which was documented above under the "Functional History" and "Functional Status" headings. Judging by the patient's diagnosis, physical exam, and functional history, the patient has potential for functional progress which will result in measurable gains while on inpatient rehab. These gains will be of substantial and practical use upon discharge in facilitating mobility and self-care at the household level. 5. Physiatrist will provide 24 hour management of medical needs as well as oversight of the therapy plan/treatment and provide guidance as appropriate regarding the interaction of the two. 6. 24 hour rehab nursing will assist with bladder management, bowel management, safety, skin/wound care, disease management, medication administration, pain management and patient education and help integrate therapy concepts, techniques,education, etc. 7. PT will assess and treat for/with: pre gait, gait training, endurance , safety, equipment, neuromuscular re education. Goals are: Mod I. 8. OT will assess and treat for/with: ADLs, Cognitive perceptual skills, Neuromuscular re education, safety, endurance, equipment. Goals are: Mod I/Sup. Therapy may proceed with showering this patient. 9. SLP will assess and treat for/with: Dysphagia. Goals are: Safe oral intake adequate calories and fluids. 10. Case Management and Social Worker will assess and treat for psychological issues and discharge planning. 11. Team conference will be held weekly to assess progress toward goals and to determine barriers to  discharge. 12. Patient will receive at least 3 hours of therapy per day at least 5 days per week. 13. ELOS: 7-10 days  14. Prognosis: excellent     Charlett Blake M.D. [facility] FAAPM&R (Sports Med, Neuromuscular Med) Diplomate [place] Board of Electrodiagnostic Med  12/26/2015

## 2015-12-27 NOTE — Progress Notes (Deleted)
PMR Admission Coordinator Pre-Admission Assessment  Patient: William [name] is an 60 y.o., male MRN: [id number] DOB: 1955/12/08 Height:  (177.8 cm) Weight: 105.5 kg (232 lb 9.4 oz)  Insurance Information HMO: PPO: PCP: IPA: 80/20: OTHER:  PRIMARY: Medicaid [place] Access Policy#: [id number] q Subscriber: pt Benefits: Phone #: [phone] Name: 12/26/15 Eff. Date: active   Medicaid Application Date: Case Manager:  Disability Application Date: Case Worker:   Emergency Contact Information Contact Information    Name Relation Home Work Mobile   [place] Friend [phone]       Current Medical History  Patient Admitting Diagnosis: central cord syndrome  History of Present Illness: William [name] is a 60 y.o. male with history of HTN, DMT2, Hep C, cervical stenosis with myelopathy s/p ACDF C3-C7 12/05/15 with post post op near complete loss of shoulder abduction and marked bilateral biceps weakness but improvement in overall neurologic deficits. He was admitted on 12/20/15 with neck swelling, inability to swallow, fever and erythema of neck/chest. CT neck done revealing large abscess 14 cm X 6 cm from skin surface to hardware with edema causing severe airway compromise. He was taken to OR for exploration of wound with drainage of abscess and irrigation of esophagus by Dr. [name] on the same day. He remained intubated thorough 3/19 and respiratory status has been stable post extubation. ID consulted for input on antibiotic regimen and recommended continuing Vancomycin/Ceftriaxone till April 27th with twice weekly BMP and Vanc trough. Patient continues to have BUE weakness affecting ability to carry out ADL tasks. Pt failed swallow eval 3/21 and made NPO. Plan for Cortrak  enteral feeding tube placement 3/23 and placement confirmed by xray prior to CIR admit. Plans for IV antibiotics per ID recommendations. Pt wants to delay PICC placement until further discussions with ID to follow.  Past Medical History  Past Medical History  Diagnosis Date  . Hypertension   . Diabetes mellitus without complication (HCC)   . Chronic hepatitis C without hepatic coma (HCC)   . History of cocaine use   . Depression   . Arthritis   . History of TIA (transient ischemic attack)   . Hyperlipidemia   . History of migraine headaches   . Obesity, Class II, BMI 35-39.9   . BPH with obstruction/lower urinary tract symptoms   . Balanitis   . Urinary retention   . Tobacco abuse     Family History  family history includes Diabetes in his father and mother; Heart disease in his father and mother; Hypertension in his father and mother; Thyroid disease in his mother.  Prior Rehab/Hospitalizations:  Has the patient had major surgery during 100 days prior to admission? Yes  Current Medications   Current facility-administered medications:  . 0.9 % sodium chloride infusion, , Intravenous, Continuous, Leslye Peer, MD, Last Rate: 10 mL/hr at 12/23/15 0830 . acetaminophen (TYLENOL) tablet 650 mg, 650 mg, Oral, Q4H PRN **OR** acetaminophen (TYLENOL) suppository 650 mg, 650 mg, Rectal, Q4H PRN, Hilda Lias, MD . amLODipine (NORVASC) tablet 10 mg, 10 mg, Oral, Daily, Lupita Leash, MD, 10 mg at 12/25/15 0916 . cefTRIAXone (ROCEPHIN) 2 g in dextrose 5 % 50 mL IVPB, 2 g, Intravenous, Q24H, Gardiner Barefoot, MD, 2 g at 12/26/15 1013 . fentaNYL (SUBLIMAZE) injection 25 mcg, 25 mcg, Intravenous, Q2H PRN, Vilinda Blanks Minor, NP, 25 mcg at 12/27/15 0921 . gabapentin (NEURONTIN) capsule 100 mg, 100 mg, Oral, TID, Nelda Bucks, MD, 100 mg at 12/25/15 0916 . heparin injection 5,000 Units,  5,000 Units, Subcutaneous, 3 times per day,  Nelda Bucksaniel J Feinstein, MD, 5,000 Units at 12/26/15 2206 . HYDROmorphone (DILAUDID) injection 0.25-0.5 mg, 0.25-0.5 mg, Intravenous, Q5 min PRN, Gaynelle AduWilliam Fitzgerald, MD . insulin aspart (novoLOG) injection 0-15 Units, 0-15 Units, Subcutaneous, 6 times per day, Rahul P Desai, PA-C, 3 Units at 12/27/15 1247 . insulin glargine (LANTUS) injection 25 Units, 25 Units, Subcutaneous, QHS, Nelda Bucksaniel J Feinstein, MD, 25 Units at 12/26/15 2350 . lactulose (CHRONULAC) 10 GM/15ML solution 30 g, 30 g, Oral, BID, Nelda Bucksaniel J Feinstein, MD, 30 g at 12/25/15 0916 . menthol-cetylpyridinium (CEPACOL) lozenge 3 mg, 1 lozenge, Oral, PRN **OR** phenol (CHLORASEPTIC) mouth spray 1 spray, 1 spray, Mouth/Throat, PRN, Hilda LiasErnesto Botero, MD . morphine 2 MG/ML injection 1-4 mg, 1-4 mg, Intravenous, Q3H PRN, Hilda LiasErnesto Botero, MD . ondansetron [facility]ZOFRAN) injection 4 mg, 4 mg, Intravenous, Q4H PRN, Hilda LiasErnesto Botero, MD, 4 mg at 12/27/15 0920 . ondansetron (ZOFRAN) injection 4 mg, 4 mg, Intravenous, Q6H PRN, Vilinda BlanksWilliam S Minor, NP, 4 mg at 12/23/15 1048 . pantoprazole (PROTONIX) EC tablet 40 mg, 40 mg, Oral, Daily, Herby AbrahamMichelle T Bell, RPH, 40 mg at 12/25/15 0916 . potassium chloride SA (K-DUR,KLOR-CON) CR tablet 20 mEq, 20 mEq, Oral, BID, Hilda LiasErnesto Botero, MD, 20 mEq at 12/25/15 2255 . promethazine (PHENERGAN) injection 12.5 mg, 12.5 mg, Intravenous, Q4H PRN, Vilinda BlanksWilliam S Minor, NP, 12.5 mg at 12/26/15 2351 . vancomycin (VANCOCIN) IVPB 1000 mg/200 mL premix, 1,000 mg, Intravenous, Q8H, Herby AbrahamMichelle T Bell, RPH, 1,000 mg at 12/27/15 1045  Patients Current Diet: Diet NPO time specified Except for: Ice Chips  Cortrak enteral feeding placement pending 12/26/15  Precautions / Restrictions Precautions Precautions: Cervical Precaution Comments: Pt is wearing no brace in bed and applies with sitting up in bed Cervical Brace: Soft collar, At all times Restrictions Weight Bearing Restrictions: No   Has the patient had 2 or more falls or a fall with injury  in the past year?No  Prior Activity Level Limited Community (1-2x/wk): Disabled but independent prior to first surgery. No license so did not drive  Journalist, newspaperHome Assistive Devices / Equipment Home Assistive Devices/Equipment: CBG Meter Home Equipment: Crutches, Bedside commode, Shower seat (shower seat does not fit in tub)  Prior Device Use: Indicate devices/aids used by the patient prior to current illness, exacerbation or injury? None of the above  Prior Functional Level Prior Function Level of Independence: Needs assistance (since first surgery) Gait / Transfers Assistance Needed: pt hel onto his fiance to mobilize since first surgery ADL's / Homemaking Assistance Needed: Since sx on 12/05/2015 pt has been totally dependent with basic ADLs and IADLs Communication / Swallowing Assistance Needed: yes Comments: Per pt report was on disability for Lt knee arthritis and previously because of brain tumor (from chart 2 weeks ago)  Self Care: Did the patient need help bathing, dressing, using the toilet or eating? Independent. Was independent prior to initial ACDF.  Indoor Mobility: Did the patient need assistance with walking from room to room (with or without device)? Independent  Stairs: Did the patient need assistance with internal or external stairs (with or without device)? Independent  Functional Cognition: Did the patient need help planning regular tasks such as shopping or remembering to take medications? Independent  Current Functional Level Cognition  Overall Cognitive Status: Within Functional Limits for tasks assessed Orientation Level: Oriented X4   Extremity Assessment (includes Sensation/Coordination)  Upper Extremity Assessment: (BUEs not functional at this time due to weakness esp noted in shoulder elevation, bicep flexion,.) RUE Deficits / Details: Pt  able to elevate shoulder against gravity. Limited elbow and shoulder flexion against gravity. Full activeextension, wrist  and hand WFL. PROM overall WFL. Good grip strength. Strength 2-/5 shoulder and elbow flexion/ext, elbow flex/ext. No changes in sensation reported. Fine motor coordination WFL. RUE Coordination: decreased gross motor LUE Deficits / Details: Pt able to elevate shoulder against gravity. Cannot flex elbow or shoulder against gravity without total A with tendency to shoulder elevate to try to achieve these movements. Full active extension for shoulder and elbow extension when weight of arm held. Decreased forearm supinatin, full AROM for pronation. WFL wrist flexion/extension and grip./ext, elbow flex/ext. LUE Coordination: decreased gross motor  Lower Extremity Assessment: LLE deficits/detail LLE Deficits / Details: Grossly ~4/5 throughout with giveway during MMT.     ADLs  Overall ADL's : Needs assistance/impaired Eating/Feeding: Maximal assistance, Sitting Eating/Feeding Details (indicate cue type and reason): Pt feed himself 75% of his meal with increased time with weight of arm fully supported and him focusing on bringing food/drink to mouth NOT bringing mouth to food/drink. It took him approximately 35 minutes to eat 3/4 of Jello and drink 1/3 of his liquids Grooming: Wash/dry hands, Wash/dry face, Sitting (shaving) Grooming Details (indicate cue type and reason): Pt max A with full weight of arm supported to wash face with RUE. Pt max A with LUE and RUE full weight of arm supported to shave with electric razor and safety razor half of his face/neck. Pt able to use gel for hands once gel put in his hands and hands in lap (not able to raise arms off of his lap independently Lower Body Dressing Details (indicate cue type and reason): Pt able to cross legs to get to feet. He could doff right sock and don 1/2 way with right sock (once it was halfway started on his foot) with LUE. He could only have doff and don left sock with RUE General ADL Comments: Pt currently total A for all basic ADLs due to  decreased use of his arms, sit<>stand min A    Mobility  Overal bed mobility: Needs Assistance Bed Mobility: Supine to Sit, Sit to Supine Supine to sit: Min guard Sit to supine: Min guard General bed mobility comments: close guard for safety. Increased time. HOB elevated. Limited use of UEs due to weakness.     Transfers  Overall transfer level: Needs assistance Equipment used: None, 4-wheeled walker Transfers: Sit to/from Stand Sit to Stand: Min guard Stand pivot transfers: Min guard General transfer comment: close guard for safety. Limited use from UEs due to weakness.     Ambulation / Gait / Stairs / Wheelchair Mobility  Ambulation/Gait Ambulation/Gait assistance: Hydrographic surveyor (Feet): 200 Feet Assistive device: 4-wheeled walker Gait Pattern/deviations: Step-through pattern, Decreased stride length General Gait Details: close guard for safety. 4 wheeled walker appears to be helpful. Pt reports he may have to have surgery on L knee at some point.  Gait velocity: reduced Gait velocity interpretation: Below normal speed for age/gender    Posture / Balance Balance Overall balance assessment: Needs assistance Sitting-balance support: Feet supported, No upper extremity supported Sitting balance-Leahy Scale: Good Standing balance support: During functional activity Standing balance-Leahy Scale: Fair Standing balance comment: fair    Special needs/care consideration Skin surgical incision Bowel mgmt: incontinent new this admission Bladder mgmt: some incontinence new this admission Cortrak enteral feeding placement pending 12/26/15 Yonker suction prn at bedside Cervical soft collar    Previous Home Environment Living Arrangements: Spouse/significant other (fiance Trish)  Lives With: Significant other Available Help at Discharge: Family, Available 24 hours/day Type of Home: Apartment Home Layout: One level Home Access: Level entry Bathroom  Shower/Tub: Engineer, manufacturing systems: Standard Bathroom Accessibility: Yes How Accessible: Accessible via walker Home Care Services: No  Discharge Living Setting Plans for Discharge Living Setting: Patient's home, Lives with (comment) (fiance, Trish) Type of Home at Discharge: Apartment Discharge Home Layout: One level Discharge Home Access: Level entry Discharge Bathroom Shower/Tub: Tub/shower unit Discharge Bathroom Toilet: Standard Discharge Bathroom Accessibility: Yes How Accessible: Accessible via walker Does the patient have any problems obtaining your medications?: No  Social/Family/Support Systems Patient Roles: Partner Contact Information: Elease Hashimoto "Cabin crew, fiance Anticipated Caregiver: fiance Anticipated Caregiver's Contact Information: see above Ability/Limitations of Caregiver: no limitations Caregiver Availability: 24/7 Discharge Plan Discussed with Primary Caregiver: Yes Is Caregiver In Agreement with Plan?: Yes Does Caregiver/Family have Issues with Lodging/Transportation while Pt is in Rehab?: No (Trish stays with pt in hospital 24/7)  Goals/Additional Needs Patient/Family Goal for Rehab: supervision with PT, OT, and SLP Expected length of stay: ELOS 10-14 days Dietary Needs: NPO with Cortrak enteral feeding Pt/Family Agrees to Admission and willing to participate: Yes Program Orientation Provided & Reviewed with Pt/Caregiver Including Roles & Responsibilities: Yes  Decrease burden of Care through IP rehab admission: n/a  Possible need for SNF placement upon discharge: not anticipated  Patient Condition: This patient's medical and functional status has changed since the consult dated: 12/24/2015 in which the Rehabilitation Physician determined and documented that the patient's condition is appropriate for intensive rehabilitative care in an inpatient rehabilitation facility. See "History of Present Illness" (above) for medical update. Functional  changes are: min to mod assist. Patient's medical and functional status update has been discussed with the Rehabilitation physician and patient remains appropriate for inpatient rehabilitation. Will admit to inpatient rehab today.  Preadmission Screen Completed By: Clois Dupes, 12/27/2015 2:26 PM ______________________________________________________________________  Discussed status with Dr.Kirsteins on 12/27/2015 at 1425 and received telephone approval for admission today.  Admission Coordinator: Clois Dupes, time 1610 Date 12/27/2015.          Cosigned by: Erick Colace, MD at 12/27/2015 2:50 PM  Revision History     Date/Time User Provider Type Action   12/27/2015 2:50 PM Erick Colace, MD Physician Cosign   12/27/2015 2:33 PM Standley Brooking, RN Rehab Admission Coordinator Addend   12/27/2015 2:26 PM Standley Brooking, RN Rehab Admission Coordinator Addend   12/27/2015 11:03 AM Standley Brooking, RN Rehab Admission Coordinator Incomplete Revision   12/26/2015 1:25 PM Ankit Karis Juba, MD Physician Cosign   12/26/2015 12:49 PM Standley Brooking, RN Rehab Admission Coordinator Sign   View Details Report

## 2015-12-27 NOTE — H&P (View-Only) (Signed)
Physical Medicine and Rehabilitation Admission H&P   Chief Complaint  Patient presents with  . Central cord syndrome.    HPI: William [name] is a 60 y.o. male with history of HTN, DMT2, Hep C, cervical stenosis with myelopathy s/p ACDF C3-C7 12/05/15 with post post op near complete loss of shoulder abduction and marked bilateral biceps weakness but improvement in overall neurologic deficits. He was admitted on 12/20/15 with neck swelling, inability to swallow, fever and erythema of neck/chest. CT neck done revealing large abscess 14 cm X 6 cm from skin surface to hardware with edema causing severe airway compromise. He was taken to OR for exploration of wound with drainage of abscess and irrigation of esophagus by Dr. Joya Salm on the same day. He remained intubated thorough 3/19 and respiratory status has been stable post extubation. ID was consulted for input on antibiotic regimen and recommended continuing Vancomycin/Ceftriaxone till April 27th with twice weekly BMP and Vanc trough. Patient refusing PICC and IV antibiotics after discharge and concerns regarding relapse discussed by Dr. Linus Salmons.  He has had problems managing secretions and MBS done revealing posterior pharyngeal edema leading to poor laryngeal closure with Penetration/aspiration and NPO recommended. He has been reporting fullness in his throat for past 24 hours but no SOB. NGT placed  Patient continues to have BUE weakness affecting ability to carry out ADL tasks. CIR recommended for follow up therapy.    ROS    Past Medical History  Diagnosis Date  . Hypertension   . Diabetes mellitus without complication ([place])   . Chronic hepatitis C without hepatic coma ([place])   . History of cocaine use   . Depression   . Arthritis   . History of TIA (transient ischemic attack)   . Hyperlipidemia   . History of migraine headaches   . Obesity, Class II, BMI 35-39.9   . BPH with  obstruction/lower urinary tract symptoms   . Balanitis   . Urinary retention   . Tobacco abuse     Past Surgical History  Procedure Laterality Date  . Rotator cuff surgery       left shoulder   . Spider bite      on neck  . Anterior cervical decomp/discectomy fusion N/A 12/05/2015    Procedure: Anterior Cervical Decompression Fusion Cervical three-four, Cervical four-five, Cervical five-six, Cervical six-seven; Surgeon: Earnie Larsson, MD; Location: [facility]; Service: Neurosurgery; Laterality: N/A;  . Anterior cervical decompression for epidural abscess N/A 12/20/2015    Procedure: ANTERIOR CERVICAL DECOMPRESSION FOR EPIDURAL ABSCESS; Surgeon: Leeroy Cha, MD; Location: [place] [facility]; Service: Neurosurgery; Laterality: N/A; ANTERIOR CERVICAL DECOMPRESSION FOR EPIDURAL ABSCESS    Family History  Problem Relation Age of Onset  . Hypertension Mother   . Diabetes Mother   . Heart disease Mother   . Thyroid disease Mother   . Hypertension Father   . Diabetes Father   . Heart disease Father     Social History: Married. Wife at home and patient has been needing assistance with ADL tasks since last surgery. Has been disabled due to knee OA. He reports that he quit smoking couple of months ago. He has been smoking about 3 cigarettes /day since age 67. He has a 3 pack-year smoking history. He does not have any smokeless tobacco history on file. He reports that he drinks alcohol. He reports that he uses illicit drugs   Allergies  Allergen Reactions  . Metformin And Related Other (See Comments)    Possible liver inflammation  .  Prednisone Other (See Comments)    Disoriented   . Tamsulosin Other (See Comments)    Nose bleed  . Other Other (See Comments)    Reports all narcotics make him sick. Pt premeds with Reglan to stop nausea    Medications Prior to  Admission  Medication Sig Dispense Refill  . amLODipine (NORVASC) 10 MG tablet Take 1 tablet (10 mg total) by mouth daily. 30 tablet 3  . BD PEN NEEDLE NANO U/F 32G X 4 MM MISC USE WITH FLEXPEN TO ADMINISTER INSULIN 100 each 5  . cyclobenzaprine (FLEXERIL) 10 MG tablet Take 1 tablet (10 mg total) by mouth 3 (three) times daily as needed for muscle spasms. 60 tablet 3  . diphenhydrAMINE (BENADRYL) 25 MG tablet Take 1 tablet (25 mg total) by mouth every 6 (six) hours. As needed with Compazine for headache. (Patient taking differently: Take 25 mg by mouth every 8 (eight) hours as needed for itching or allergies. As needed with Compazine for headache.) 20 tablet 0  . Elastic Bandages & Supports (THUMB BRACE) MISC 1 Device by Does not apply route daily as needed. 1 each 0  . fluconazole (DIFLUCAN) 150 MG tablet TAKE 1 TABLET (150 MG TOTAL) BY MOUTH EVERY 7 (SEVEN) DAYS. (Patient taking differently: TAKE 1 TABLET (150 MG TOTAL) BY MOUTH EVERY 7 (SEVEN) DAYS on Monday) 4 tablet 0  . gabapentin (NEURONTIN) 100 MG capsule Take 1 capsule (100 mg total) by mouth 3 (three) times daily. 90 capsule 3  . glucose blood (ACCU-CHEK AVIVA) test strip Use as instructed to check blood glucose 4x/day 200 each 12  . insulin aspart (NOVOLOG FLEXPEN) 100 UNIT/ML FlexPen Inject 15 Units into the skin 3 (three) times daily with meals. (Patient taking differently: Inject 25 Units into the skin 3 (three) times daily with meals. ) 8 pen 5  . Insulin Glargine (LANTUS SOLOSTAR) 100 UNIT/ML Solostar Pen Inject 30 Units into the skin 2 (two) times daily. (Patient taking differently: Inject 50 Units into the skin daily at 10 pm. ) 6 pen 2  . Insulin Pen Needle (EASY TOUCH PEN NEEDLES) 32G X 6 MM MISC Use with Flexpen to administer Insulin 100 each 11  . Lancets (FREESTYLE) lancets Use as instructed to check blood glucose 4x/day 200 each 12  . metoCLOPramide (REGLAN) 10 MG  tablet Take 1 tablet (10 mg total) by mouth 3 (three) times daily as needed for nausea (headache / nausea). 20 tablet 0  . oxyCODONE-acetaminophen (PERCOCET/ROXICET) 5-325 MG tablet Take 1-2 tablets by mouth every 6 (six) hours as needed for severe pain. 90 tablet 0  . pantoprazole (PROTONIX) 40 MG tablet Take 1 tablet (40 mg total) by mouth 2 (two) times daily. Twice a day for 10 days; then start using it once a day (Patient taking differently: Take 40 mg by mouth daily. ) 60 tablet 1  . sucralfate (CARAFATE) 1 GM/10ML suspension Take 10 mLs (1 g total) by mouth 4 (four) times daily - with meals and at bedtime. 420 mL 1    Home: Home Living Family/patient expects to be discharged to:: Inpatient rehab Living Arrangements: Spouse/significant other (fiance Trish) Available Help at Discharge: Family, Available 24 hours/day Type of Home: Apartment Home Access: Level entry Home Layout: One level Bathroom Shower/Tub: Chiropodist: Standard Bathroom Accessibility: Yes Home Equipment: Crutches, Bedside commode, Shower seat (shower seat does not fit in tub) Lives With: Significant other  Functional History: Prior Function Level of Independence: Needs assistance (since first surgery)  Gait / Transfers Assistance Needed: pt hel onto his fiance to mobilize since first surgery ADL's / Homemaking Assistance Needed: Since sx on 12/05/2015 pt has been totally dependent with basic ADLs and IADLs Communication / Swallowing Assistance Needed: yes Comments: Per pt report was on disability for Lt knee arthritis and previously because of brain tumor (from chart 2 weeks ago)  Functional Status:  Mobility: Bed Mobility Overal bed mobility: Needs Assistance Bed Mobility: Supine to Sit, Sit to Supine Supine to sit: Min guard Sit to supine: Min guard General bed mobility comments: HOB flat and used cues to sequence sitting up and to get legs onto  bed Transfers Overall transfer level: Needs assistance Equipment used: 4-wheeled walker (at urging of wife versus using regular walker) Transfers: Sit to/from Stand, Risk manager Sit to Stand: Min guard, Min assist Stand pivot transfers: Min guard General transfer comment: Pt is using hands on bed to assist the activity Ambulation/Gait Ambulation/Gait assistance: Min guard, Min assist Ambulation Distance (Feet): 250 Feet (with a sitting rest on rollator at half way point) Assistive device: 4-wheeled walker Gait Pattern/deviations: Step-through pattern, Decreased stride length, Ataxic, Wide base of support General Gait Details: L knee more controlled on walker and pt mainly fatigued with walking Gait velocity: reduced Gait velocity interpretation: Below normal speed for age/gender    ADL: ADL Overall ADL's : Needs assistance/impaired Eating/Feeding: Maximal assistance, Sitting Eating/Feeding Details (indicate cue type and reason): Pt feed himself 75% of his meal with increased time with weight of arm fully supported and him focusing on bringing food/drink to mouth NOT bringing mouth to food/drink. It took him approximately 35 minutes to eat 3/4 of Jello and drink 1/3 of his liquids Grooming: Wash/dry hands, Wash/dry face, Sitting (shaving) Grooming Details (indicate cue type and reason): Pt max A with full weight of arm supported to wash face with RUE. Pt max A with LUE and RUE full weight of arm supported to shave with electric razor and safety razor half of his face/neck. Pt able to use gel for hands once gel put in his hands and hands in lap (not able to raise arms off of his lap independently Lower Body Dressing Details (indicate cue type and reason): Pt able to cross legs to get to feet. He could doff right sock and don 1/2 way with right sock (once it was halfway started on his foot) with LUE. He could only have doff and don left sock with RUE General ADL Comments: Pt  currently total A for all basic ADLs due to decreased use of his arms, sit<>stand min A  Cognition: Cognition Overall Cognitive Status: Within Functional Limits for tasks assessed Orientation Level: Oriented X4 Cognition Arousal/Alertness: Awake/alert Behavior During Therapy: WFL for tasks assessed/performed Overall Cognitive Status: Within Functional Limits for tasks assessed   Blood pressure 124/97, pulse 106, temperature 97.7 F (36.5 C), temperature source Oral, resp. rate 18, height _0  (1.778 m), weight 105.5 kg (232 lb 9.4 oz), SpO2 91 %. Physical Exam HEENT has a nasal feeding tube General: No acute distress Mood and affect are appropriate Heart: Regular rate and rhythm no rubs murmurs or extra sounds Lungs: Clear to auscultation, breathing unlabored, no rales or wheezes Abdomen: Positive bowel sounds, soft nontender to palpation, nondistended Extremities: No clubbing, cyanosis, or edema Skin: No evidence of breakdown, no evidence of rash Neurologic: Cranial nerves II through XII intact, motor strength is 1/5 in bilateral deltoid,2- bicep, 3 tricep, 4 grip,4+ hip flexor, knee extensors, ankle dorsiflexor  and plantar flexor Sensory exam normal sensation to light touch  in bilateral upper and lower extremities Cerebellar exam normal finger to nose to finger as well as heel to shin in bilateral upper and lower extremities Musculoskeletal: Full range of motion in all 4 extremities. No joint swelling, Mild dorsum of the hand swelling/edema, nontender no erythema Deep tendon reflexes are hyporeflexic bilateral upper and lower limbs  Lab Results Last 48 Hours    Results for orders placed or performed during the hospital encounter of 12/20/15 (from the past 48 hour(s))  Glucose, capillary Status: Abnormal   Collection Time: 12/24/15 7:52 PM  Result Value Ref Range   Glucose-Capillary 178 (H) 65 - 99 mg/dL  Glucose, capillary Status: Abnormal   Collection  Time: 12/25/15 12:08 AM  Result Value Ref Range   Glucose-Capillary 208 (H) 65 - 99 mg/dL   Comment 1 Notify RN    Comment 2 Document in Chart   Glucose, capillary Status: Abnormal   Collection Time: 12/25/15 4:08 AM  Result Value Ref Range   Glucose-Capillary 230 (H) 65 - 99 mg/dL   Comment 1 Notify RN    Comment 2 Document in Chart   BMET in AM Status: Abnormal   Collection Time: 12/25/15 5:24 AM  Result Value Ref Range   Sodium 137 135 - 145 mmol/L   Potassium 2.9 (L) 3.5 - 5.1 mmol/L   Chloride 100 (L) 101 - 111 mmol/L   CO2 26 22 - 32 mmol/L   Glucose, Bld 202 (H) 65 - 99 mg/dL   BUN <5 (L) 6 - 20 mg/dL   Creatinine, Ser 0.67 0.61 - 1.24 mg/dL   Calcium 9.0 8.9 - 10.3 mg/dL   GFR calc non Af Amer >60 >60 mL/min   GFR calc Af Amer >60 >60 mL/min    Comment: (NOTE) The eGFR has been calculated using the CKD EPI equation. This calculation has not been validated in all clinical situations. eGFR's persistently <60 mL/min signify possible Chronic Kidney Disease.    Anion gap 11 5 - 15  Glucose, capillary Status: Abnormal   Collection Time: 12/25/15 6:44 AM  Result Value Ref Range   Glucose-Capillary 153 (H) 65 - 99 mg/dL   Comment 1 Notify RN    Comment 2 Document in Chart   Glucose, capillary Status: Abnormal   Collection Time: 12/25/15 7:50 AM  Result Value Ref Range   Glucose-Capillary 148 (H) 65 - 99 mg/dL  Glucose, capillary Status: Abnormal   Collection Time: 12/25/15 11:55 AM  Result Value Ref Range   Glucose-Capillary 240 (H) 65 - 99 mg/dL  Glucose, capillary Status: Abnormal   Collection Time: 12/25/15 4:56 PM  Result Value Ref Range   Glucose-Capillary 201 (H) 65 - 99 mg/dL  Glucose, capillary Status: Abnormal   Collection Time: 12/25/15 8:03 PM  Result Value Ref Range    Glucose-Capillary 230 (H) 65 - 99 mg/dL   Comment 1 Notify RN    Comment 2 Document in Chart   Glucose, capillary Status: Abnormal   Collection Time: 12/25/15 11:53 PM  Result Value Ref Range   Glucose-Capillary 134 (H) 65 - 99 mg/dL  Glucose, capillary Status: Abnormal   Collection Time: 12/26/15 4:06 AM  Result Value Ref Range   Glucose-Capillary 154 (H) 65 - 99 mg/dL   Comment 1 Notify RN    Comment 2 Document in Chart   Glucose, capillary Status: Abnormal   Collection Time: 12/26/15 7:33 AM  Result Value Ref Range  Glucose-Capillary 132 (H) 65 - 99 mg/dL  Glucose, capillary Status: Abnormal   Collection Time: 12/26/15 11:48 AM  Result Value Ref Range   Glucose-Capillary 169 (H) 65 - 99 mg/dL        Medical Problem List and Plan: 1. Cervical myelopathy and bilateral C5 radiculopathy secondary to Cervical spinal stenosis and postoperative abscess 2. DVT Prophylaxis/Anticoagulation: Pharmaceutical: Heparin 3. Pain Management: Discontinue IV pain meds, may start oxycodone 1 mg per mL, 5-10 mL every 4 hours when necessary 4. Mood: Overt signs of depression, will monitor for adjustment disorder, neuropsychologist evaluation as necessary 5. Neuropsych: This patient is capable of making decisions on his own behalf. 6. Skin/Wound Care: monitor wound for healing 7. Fluids/Electrolytes/Nutrition: Monitor I/O. Check lytes in am 8. HTN: Monitor BP bid.  9. DMT2: Monitor BS ac/hs.Continue lantus 25 units at bedtime. Use SSI  10. Neck abscess: To continue Vanc (trough 15-20) and Ceftriaxone 2 grams daily till April 27th-We discussed that he would need to go home on IV antibiotics and a more permanent IV access line would need to be placed   Post Admission Physician Evaluation: Functional deficits secondary to Cervical myelopathy and bilateral C5 radiculopathy secondary to Cervical spinal stenosis and  postoperative abscess 1. . 2. Patient is admitted to receive collaborative, interdisciplinary care between the physiatrist, rehab nursing staff, and therapy team. 3. Patient's level of medical complexity and substantial therapy needs in context of that medical necessity cannot be provided at a lesser intensity of care such as a SNF. 4. Patient has experienced substantial functional loss from his/her baseline which was documented above under the "Functional History" and "Functional Status" headings. Judging by the patient's diagnosis, physical exam, and functional history, the patient has potential for functional progress which will result in measurable gains while on inpatient rehab. These gains will be of substantial and practical use upon discharge in facilitating mobility and self-care at the household level. 5. Physiatrist will provide 24 hour management of medical needs as well as oversight of the therapy plan/treatment and provide guidance as appropriate regarding the interaction of the two. 6. 24 hour rehab nursing will assist with bladder management, bowel management, safety, skin/wound care, disease management, medication administration, pain management and patient education and help integrate therapy concepts, techniques,education, etc. 7. PT will assess and treat for/with: pre gait, gait training, endurance , safety, equipment, neuromuscular re education. Goals are: Mod I. 8. OT will assess and treat for/with: ADLs, Cognitive perceptual skills, Neuromuscular re education, safety, endurance, equipment. Goals are: Mod I/Sup. Therapy may proceed with showering this patient. 9. SLP will assess and treat for/with: Dysphagia. Goals are: Safe oral intake adequate calories and fluids. 10. Case Management and Social Worker will assess and treat for psychological issues and discharge planning. 11. Team conference will be held weekly to assess progress toward goals and to determine barriers to  discharge. 12. Patient will receive at least 3 hours of therapy per day at least 5 days per week. 13. ELOS: 7-10 days  14. Prognosis: excellent     Charlett Blake M.D. [facility] FAAPM&R (Sports Med, Neuromuscular Med) Diplomate [place] Board of Electrodiagnostic Med  12/26/2015

## 2015-12-27 NOTE — Discharge Summary (Signed)
Physician Discharge Summary  Patient ID: William [name] MRN: [id number] DOB/AGE: 61-Jan-1957 61 y.o.  Admit date: 12/20/2015 Discharge date: 12/27/2015  Admission Diagnoses:cervical hematoma  Discharge Diagnoses:  Active Problems:   Abscess, neck   Endotracheal tube present   Post-operative wound abscess   Respiratory failure with hypoxia ([facility])   Brodie's abscess of cervical spine ([facility])   Encounter for nasogastric (NG) tube placement   Neck abscess   Uncontrolled type 2 diabetes mellitus with complication, with long-term current use of insulin ([facility])   Chronic hepatitis C without hepatic coma ([facility])   H/O cervical spine surgery   Tachycardia   Tachypnea   Hypokalemia   Discharged Condition: stable  Hospital Course: surgery  Consult  Rehab med  Significant Diagnostic Studies:ct neck  Treatments: evacuation of cervical hematoma  Discharge Exam: ng tube in place. Weakness deltoids and biceps  Disposition: rehab med     Medication List    ASK your doctor about these medications        amLODipine 10 MG tablet  Commonly known as:  NORVASC  Take 1 tablet (10 mg total) by mouth daily.     cyclobenzaprine 10 MG tablet  Commonly known as:  FLEXERIL  Take 1 tablet (10 mg total) by mouth 3 (three) times daily as needed for muscle spasms.     diphenhydrAMINE 25 MG tablet  Commonly known as:  BENADRYL  Take 1 tablet (25 mg total) by mouth every 6 (six) hours. As needed with Compazine for headache.     fluconazole 150 MG tablet  Commonly known as:  DIFLUCAN  TAKE 1 TABLET (150 MG TOTAL) BY MOUTH EVERY 7 (SEVEN) DAYS.     freestyle lancets  Use as instructed to check blood glucose 4x/day     gabapentin 100 MG capsule  Commonly known as:  NEURONTIN  Take 1 capsule (100 mg total) by mouth 3 (three) times daily.     glucose blood test strip  Commonly known as:  ACCU-CHEK AVIVA  Use as instructed to check blood glucose 4x/day     insulin aspart 100 UNIT/ML FlexPen   Commonly known as:  NOVOLOG FLEXPEN  Inject 15 Units into the skin 3 (three) times daily with meals.     Insulin Glargine 100 UNIT/ML Solostar Pen  Commonly known as:  LANTUS SOLOSTAR  Inject 30 Units into the skin 2 (two) times daily.     Insulin Pen Needle 32G X 6 MM Misc  Commonly known as:  EASY TOUCH PEN NEEDLES  Use with Flexpen to administer Insulin     BD PEN NEEDLE NANO U/F 32G X 4 MM Misc  Generic drug:  Insulin Pen Needle  USE WITH FLEXPEN TO ADMINISTER INSULIN     metoCLOPramide 10 MG tablet  Commonly known as:  REGLAN  Take 1 tablet (10 mg total) by mouth 3 (three) times daily as needed for nausea (headache / nausea).     oxyCODONE-acetaminophen 5-325 MG tablet  Commonly known as:  PERCOCET/ROXICET  Take 1-2 tablets by mouth every 6 (six) hours as needed for severe pain.     pantoprazole 40 MG tablet  Commonly known as:  PROTONIX  Take 1 tablet (40 mg total) by mouth 2 (two) times daily. Twice a day for 10 days; then start using it once a day     sucralfate 1 GM/10ML suspension  Commonly known as:  CARAFATE  Take 10 mLs (1 g total) by mouth 4 (four) times daily -  with meals  and at bedtime.     THUMB BRACE Misc  1 Device by Does not apply route daily as needed.         Signed: Karn CassisBOTERO,Sherita Decoste M 12/27/2015, 3:11 PM

## 2015-12-27 NOTE — Progress Notes (Deleted)
Physical Medicine and Rehabilitation Consult  Reason for Consult: Central cord syndrome Referring Physician: Dr. [name]   HPI: William [name] is a 60 y.o. male with history of HTN, DMT2, Hep C, cervical stenosis with myelopathy s/p ACDF C3-C7 12/05/15 with post post op near complete loss of shoulder abduction and marked bilateral biceps weakness but improvement in overall neurologic deficits. He was admitted on 12/20/15 with neck swelling, inability to swallow, fever and erythema of neck/chest. CT neck done revealing large abscess 14 cm X 6 cm from skin surface to hardware with edema causing severe airway compromise. He was taken to OR for exploration of wound with drainage of abscess and irrigation of esophagus by Dr. [name] on the same day. He remained intubated thorough 3/19 and respiratory status has been stable post extubation. ID consulted for input on antibiotic regimen and recommended continuing Vancomycin/Ceftriaxone till April 27th with twice weekly BMP and Vanc trough. Patient continues to have BUE weakness affecting ability to carry out ADL tasks. CIR recommended for follow up therapy.    Review of Systems  HENT: Negative for hearing loss.  Eyes: Negative for blurred vision and double vision.  Respiratory: Negative for cough and sputum production.  Gastrointestinal: Positive for constipation. Negative for nausea.  Genitourinary: Negative for dysuria and urgency.  Musculoskeletal: Positive for neck pain. Negative for back pain and joint pain.  Neurological: Positive for sensory change and focal weakness. Negative for dizziness, speech change, weakness and headaches.  Psychiatric/Behavioral: Positive for memory loss. Negative for depression.  All other systems reviewed and are negative.     Past Medical History  Diagnosis Date  . Hypertension   . Diabetes mellitus without complication (HCC)   . Chronic hepatitis C without hepatic coma (HCC)   . History of cocaine  use   . Depression   . Arthritis   . History of TIA (transient ischemic attack)   . Hyperlipidemia   . History of migraine headaches   . Obesity, Class II, BMI 35-39.9   . BPH with obstruction/lower urinary tract symptoms   . Balanitis   . Urinary retention   . Tobacco abuse     Past Surgical History  Procedure Laterality Date  . Rotator cuff surgery       left shoulder   . Spider bite      on neck  . Anterior cervical decomp/discectomy fusion N/A 12/05/2015    Procedure: Anterior Cervical Decompression Fusion Cervical three-four, Cervical four-five, Cervical five-six, Cervical six-seven; Surgeon: Julio Sicks, MD; Location: [facility]; Service: Neurosurgery; Laterality: N/A;  . Anterior cervical decompression for epidural abscess N/A 12/20/2015    Procedure: ANTERIOR CERVICAL DECOMPRESSION FOR EPIDURAL ABSCESS; Surgeon: Hilda Lias, MD; Location: [facility]; Service: Neurosurgery; Laterality: N/A; ANTERIOR CERVICAL DECOMPRESSION FOR EPIDURAL ABSCESS    Family History  Problem Relation Age of Onset  . Hypertension Mother   . Diabetes Mother   . Heart disease Mother   . Thyroid disease Mother   . Hypertension Father   . Diabetes Father   . Heart disease Father     Social History: Married. Wife at home and patient has been needing assistance with ADL tasks since last surgery. Has been disabled due to knee OA. He reports that he quit smoking couple of months ago. He has been smoking about 3 cigarettes /day since age 86. He has a 3 pack-year smoking history. He does not have any smokeless tobacco history on file. He reports that he drinks alcohol. He reports that  he uses illicit drugs.    Allergies  Allergen Reactions  . Metformin And Related Other (See Comments)    Possible liver inflammation  . Prednisone Other (See Comments)    Disoriented     . Tamsulosin Other (See Comments)    Nose bleed  . Other Other (See Comments)    Reports all narcotics make him sick. Pt premeds with Reglan to stop nausea    Medications Prior to Admission  Medication Sig Dispense Refill  . amLODipine (NORVASC) 10 MG tablet Take 1 tablet (10 mg total) by mouth daily. 30 tablet 3  . BD PEN NEEDLE NANO U/F 32G X 4 MM MISC USE WITH FLEXPEN TO ADMINISTER INSULIN 100 each 5  . cyclobenzaprine (FLEXERIL) 10 MG tablet Take 1 tablet (10 mg total) by mouth 3 (three) times daily as needed for muscle spasms. 60 tablet 3  . diphenhydrAMINE (BENADRYL) 25 MG tablet Take 1 tablet (25 mg total) by mouth every 6 (six) hours. As needed with Compazine for headache. (Patient taking differently: Take 25 mg by mouth every 8 (eight) hours as needed for itching or allergies. As needed with Compazine for headache.) 20 tablet 0  . Elastic Bandages & Supports (THUMB BRACE) MISC 1 Device by Does not apply route daily as needed. 1 each 0  . fluconazole (DIFLUCAN) 150 MG tablet TAKE 1 TABLET (150 MG TOTAL) BY MOUTH EVERY 7 (SEVEN) DAYS. (Patient taking differently: TAKE 1 TABLET (150 MG TOTAL) BY MOUTH EVERY 7 (SEVEN) DAYS on Monday) 4 tablet 0  . gabapentin (NEURONTIN) 100 MG capsule Take 1 capsule (100 mg total) by mouth 3 (three) times daily. 90 capsule 3  . glucose blood (ACCU-CHEK AVIVA) test strip Use as instructed to check blood glucose 4x/day 200 each 12  . insulin aspart (NOVOLOG FLEXPEN) 100 UNIT/ML FlexPen Inject 15 Units into the skin 3 (three) times daily with meals. (Patient taking differently: Inject 25 Units into the skin 3 (three) times daily with meals. ) 8 pen 5  . Insulin Glargine (LANTUS SOLOSTAR) 100 UNIT/ML Solostar Pen Inject 30 Units into the skin 2 (two) times daily. (Patient taking differently: Inject 50 Units into the skin daily at 10 pm. ) 6 pen 2  . Insulin Pen Needle (EASY TOUCH PEN NEEDLES)  32G X 6 MM MISC Use with Flexpen to administer Insulin 100 each 11  . Lancets (FREESTYLE) lancets Use as instructed to check blood glucose 4x/day 200 each 12  . metoCLOPramide (REGLAN) 10 MG tablet Take 1 tablet (10 mg total) by mouth 3 (three) times daily as needed for nausea (headache / nausea). 20 tablet 0  . oxyCODONE-acetaminophen (PERCOCET/ROXICET) 5-325 MG tablet Take 1-2 tablets by mouth every 6 (six) hours as needed for severe pain. 90 tablet 0  . pantoprazole (PROTONIX) 40 MG tablet Take 1 tablet (40 mg total) by mouth 2 (two) times daily. Twice a day for 10 days; then start using it once a day (Patient taking differently: Take 40 mg by mouth daily. ) 60 tablet 1  . sucralfate (CARAFATE) 1 GM/10ML suspension Take 10 mLs (1 g total) by mouth 4 (four) times daily - with meals and at bedtime. 420 mL 1    Home: Home Living Family/patient expects to be discharged to:: Inpatient rehab Living Arrangements: Spouse/significant other Available Help at Discharge: Family, Available 24 hours/day Type of Home: Apartment Home Access: Level entry Home Layout: One level Bathroom Shower/Tub: Engineer, manufacturing systems: Standard Home Equipment: Crutches, Bedside commode, Shower seat (shower  seat does not fit his tub)  Functional History: Prior Function Level of Independence: Needs assistance Gait / Transfers Assistance Needed: Pt has been independent with basic ambulation ADL's / Homemaking Assistance Needed: Since sx on 12/05/2015 pt has been totally dependent with basic ADLs and IADLs Comments: Per pt report was on disability for Lt knee arthritis and previously because of brain tumor (from chart 2 weeks ago) Functional Status:  Mobility: Bed Mobility Overal bed mobility: Needs Assistance Bed Mobility: Supine to Sit Supine to sit: Min guard General bed mobility comments: raised straight up into long sitting to then move legs off of bed  Transfers Overall  transfer level: Needs assistance Equipment used: 1 person hand held assist Transfers: Sit to/from Stand, Stand Pivot Transfers Sit to Stand: Min assist Stand pivot transfers: Min assist      ADL: ADL Overall ADL's : Needs assistance/impaired Eating/Feeding: Maximal assistance, Sitting Eating/Feeding Details (indicate cue type and reason): Pt feed himself 75% of his meal with increased time with weight of arm fully supported and him focusing on bringing food/drink to mouth NOT bringing mouth to food/drink. It took him approximately 35 minutes to eat 3/4 of Jello and drink 1/3 of his liquids Grooming: Wash/dry hands, Wash/dry face, Sitting (shaving) Grooming Details (indicate cue type and reason): Pt max A with full weight of arm supported to wash face with RUE. Pt max A with LUE and RUE full weight of arm supported to shave with electric razor and safety razor half of his face/neck. Pt able to use gel for hands once gel put in his hands and hands in lap (not able to raise arms off of his lap independently Lower Body Dressing Details (indicate cue type and reason): Pt able to cross legs to get to feet. He could doff right sock and don 1/2 way with right sock (once it was halfway started on his foot) with LUE. He could only have doff and don left sock with RUE General ADL Comments: Pt currently total A for all basic ADLs due to decreased use of his arms, sit<>stand min A  Cognition: Cognition Overall Cognitive Status: Within Functional Limits for tasks assessed Orientation Level: Oriented X4 Cognition Arousal/Alertness: Awake/alert Behavior During Therapy: WFL for tasks assessed/performed Overall Cognitive Status: Within Functional Limits for tasks assessed   Blood pressure 138/90, pulse 122, temperature 98.1 F (36.7 C), temperature source Oral, resp. rate 22, height 5\' 10"  (1.778 m), weight 105.5 kg (232 lb 9.4 oz), SpO2 95 %. Physical Exam  Nursing note and vitals  reviewed. Constitutional: He is oriented to person, place, and time. He appears well-developed and well-nourished.  Sitting up in chair.  HENT:  Head: Normocephalic and atraumatic.  Mouth/Throat: Oropharynx is clear and moist.  Eyes: Conjunctivae and EOM are normal. Pupils are equal, round, and reactive to light.  Neck:  Soft collar in place  Cardiovascular: Regular rhythm. Tachycardia present.  Heart rate 120 at rest.  Respiratory: Effort normal and breath sounds normal. No respiratory distress. He has no wheezes. He exhibits no tenderness.  GI: Soft. Bowel sounds are normal. He exhibits no distension. There is no tenderness.  Musculoskeletal: He exhibits edema. He exhibits no tenderness.  Neurological: He is alert and oriented to person, place, and time.  Speech clear but kept expectorating saliva due to difficulty handling secretions--denied difficulty swallowing.  Motor: RUE: deltoid 1/5, biceps/triceps 3/5, intrinsics 4+5. LUE deltoid 2+/5, biceps/triceps 2+/5, intrinsics 4+/5. BLE: 5/5 proximal to distal Sensation intact to light touch DTRs symmetric  Skin: Skin is dry. No rash noted.  Psychiatric: He has a normal mood and affect. His behavior is normal.     Lab Results Last 24 Hours    Results for orders placed or performed during the hospital encounter of 12/20/15 (from the past 24 hour(s))  Glucose, capillary Status: Abnormal   Collection Time: 12/23/15 12:20 PM  Result Value Ref Range   Glucose-Capillary 152 (H) 65 - 99 mg/dL  Glucose, capillary Status: Abnormal   Collection Time: 12/23/15 4:05 PM  Result Value Ref Range   Glucose-Capillary 143 (H) 65 - 99 mg/dL  Glucose, capillary Status: Abnormal   Collection Time: 12/23/15 7:46 PM  Result Value Ref Range   Glucose-Capillary 161 (H) 65 - 99 mg/dL   Comment 1 Notify RN   Glucose, capillary Status: Abnormal   Collection Time: 12/24/15 12:24 AM  Result  Value Ref Range   Glucose-Capillary 153 (H) 65 - 99 mg/dL   Comment 1 Notify RN   Basic metabolic panel Status: Abnormal   Collection Time: 12/24/15 3:12 AM  Result Value Ref Range   Sodium 137 135 - 145 mmol/L   Potassium 3.4 (L) 3.5 - 5.1 mmol/L   Chloride 100 (L) 101 - 111 mmol/L   CO2 28 22 - 32 mmol/L   Glucose, Bld 129 (H) 65 - 99 mg/dL   BUN <5 (L) 6 - 20 mg/dL   Creatinine, Ser 5.780.63 0.61 - 1.24 mg/dL   Calcium 8.8 (L) 8.9 - 10.3 mg/dL   GFR calc non Af Amer >60 >60 mL/min   GFR calc Af Amer >60 >60 mL/min   Anion gap 9 5 - 15  Glucose, capillary Status: Abnormal   Collection Time: 12/24/15 4:17 AM  Result Value Ref Range   Glucose-Capillary 109 (H) 65 - 99 mg/dL   Comment 1 Notify RN   Glucose, capillary Status: Abnormal   Collection Time: 12/24/15 7:43 AM  Result Value Ref Range   Glucose-Capillary 118 (H) 65 - 99 mg/dL  Vancomycin, trough Status: Abnormal   Collection Time: 12/24/15 7:58 AM  Result Value Ref Range   Vancomycin Tr 7 (L) 10.0 - 20.0 ug/mL      Imaging Results (Last 48 hours)    Dg Abd 1 View  12/23/2015 CLINICAL DATA: Nausea and vomiting EXAM: ABDOMEN - 1 VIEW COMPARISON: December 20, 2015 FINDINGS: There is moderate stool throughout the colon. There is no bowel dilatation or air-fluid level suggesting obstruction. No free air. There is atelectatic change in the left lung base region. Early pneumonia in this area cannot be excluded. IMPRESSION: Bowel gas pattern unremarkable without obstruction or free air evident. Atelectasis left base with questionable early pneumonia in this area. Electronically Signed By: Bretta BangWilliam Woodruff III M.D. On: 12/23/2015 15:18   Dg Chest Port 1 View  12/23/2015 CLINICAL DATA: Respiratory failure EXAM: PORTABLE CHEST 1 VIEW COMPARISON: Chest radiograph from one day prior. FINDINGS: Endotracheal tube tip is  3.5 cm above the carina. Enteric tube enters the stomach with the tip not seen on this image. Surgical hardware from ACDF and skin staples are partially visualized overlying the lower cervical spine. Stable cardiomediastinal silhouette with top-normal heart size. No pneumothorax. No pleural effusion. Patchy left basilar lung opacity appears stable. Decreased right basilar atelectasis. No pulmonary edema. IMPRESSION: 1. Well-positioned support hardware as described . 2. Decreased right basilar atelectasis. Stable patchy left basilar lung opacities. Electronically Signed By: Delbert PhenixJason A Poff M.D. On: 12/23/2015 07:37     Assessment/Plan: Diagnosis: Central cord  syndrome Labs and images independently reviewed. Records reviewed and summated above.  1. Does the need for close, 24 hr/day medical supervision in concert with the patient's rehab needs make it unreasonable for this patient to be served in a less intensive setting? Potentially  2. Co-Morbidities requiring supervision/potential complications: HTN (monitor and provide prns in accordance with increased physical exertion and pain), DMT2 (Monitor in accordance with exercise and adjust meds as necessary), Hep C (avoid hepatotoxic meds), cervical stenosis with myelopathy s/p surgery, Tachycardia (monitor in accordance with pain and increasing activity), tachypnea (monitor RR and O2 Sats with increased physical exertion), hypokalemia (cont to monitor and replete as necessary recording), abscess s/p surgery (continue abx per ID) 3. Due to safety, skin/wound care, disease management, pain management and patient education, does the patient require 24 hr/day rehab nursing? Yes 4. Does the patient require coordinated care of a physician, rehab nurse, PT (1-2 hrs/day, 5 days/week) and OT (1-2 hrs/day, 5 days/week) to address physical and functional deficits in the context of the above medical diagnosis(es)? Potentially Addressing deficits in the following  areas: balance, endurance, locomotion, strength, transferring, bathing, dressing, feeding, grooming, toileting and psychosocial support 5. Can the patient actively participate in an intensive therapy program of at least 3 hrs of therapy per day at least 5 days per week? Potentially 6. The potential for patient to make measurable gains while on inpatient rehab is TBD 7. Anticipated functional outcomes upon discharge from inpatient rehab are TBD with PT, TBD with OT, n/a with SLP. 8. Estimated rehab length of stay to reach the above functional goals is: TBD. 9. Does the patient have adequate social supports and living environment to accommodate these discharge functional goals? Potentially 10. Anticipated D/C setting: Home 11. Anticipated post D/C treatments: HH therapy and Home excercise program 12. Overall Rehab/Functional Prognosis: good  RECOMMENDATIONS: This patient's condition is appropriate for continued rehabilitative care in the following setting: Patient has not been evaluated by physical therapy as of yet. Prior to recent discharge patient did not have a PT need. Will await formal consult before recommending most appropriate discharge disposition. Patient has agreed to participate in recommended program. Yes Note that insurance prior authorization may be required for reimbursement for recommended care.  Comment: Rehab Admissions Coordinator to follow up.  Maryla Morrow, MD 12/24/2015       Revision History     Date/Time User Provider Type Action   12/24/2015 1:20 PM Ankit Karis Juba, MD Physician Sign   12/24/2015 1:02 PM Ankit Karis Juba, MD Physician Share   12/24/2015 11:34 AM Jacquelynn Cree, PA-C Physician Assistant Share   View Details Report

## 2015-12-27 NOTE — Progress Notes (Signed)
Called regarding PICC placement.  RN states patient is receiving NG tube at this time then will be moved to inpatient rehab. floor.  Request to place PICC once transfer complete.  Will follow up this afternoon.

## 2015-12-27 NOTE — Care Management Note (Signed)
Case Management Note  Patient Details  Name: William [name] MRN: [id number] Date of Birth: 1956/04/25  Subjective/Objective:                    Action/Plan: Patient discharging to CIR today. No further needs per CM.   Expected Discharge Date:                  Expected Discharge Plan:     In-House Referral:     Discharge planning Services     Post Acute Care Choice:    Choice offered to:     DME Arranged:    DME Agency:     HH Arranged:    HH Agency:     Status of Service:     Medicare Important Message Given:    Date Medicare IM Given:    Medicare IM give by:    Date Additional Medicare IM Given:    Additional Medicare Important Message give by:     If discussed at Long Length of Stay Meetings, dates discussed:    Additional Comments:  Kermit BaloKelli F Prudencio Velazco, RN 12/27/2015, 2:33 PM

## 2015-12-27 NOTE — Progress Notes (Signed)
Cortrak Tube Team Note: Request made to place Cortrak feeding tube prior to admission to CIR.  Tube difficult to position. Xray pending.  Recommend IR to re-position as needed.   Kendell BaneHeather Lulamae Skorupski RD, LDN, CNSC [phone] Pager [phone] After Hours Pager

## 2015-12-27 NOTE — Progress Notes (Signed)
I await means of nutritional support to admit pt to admit pt to inpt rehab today. Pt and fiance feel neck edema has decreased since yesterday. I will follow up today. Discussed with RN at bedside goal for enteral feeding tube placement. [phone]

## 2015-12-27 NOTE — Progress Notes (Signed)
Pharmacy Antibiotic Note  William [name] is a 60 y.o. male admitted on 12/20/2015 with cervical abscess. Pharmacy has been consulted for Vancomycin dosing. He is also on rocephin. Vancomycin trough = 20, drawn ~ 1 hr early, but his scr is also trending up 0.67 > 1.07. I am concerning about accumulation at current dose. Pt will be transferred to CIR tonight.   Plan: - Decrease dose to 750 mg IV Q 8 hrs, next dose tonight at 2200 - Monitor renal function closely - Recheck vancomycin trough after 3-5 doses.  Height: 5\' 10"  (177.8 cm) Weight: 232 lb 9.4 oz (105.5 kg) IBW/kg (Calculated) : 73  Temp (24hrs), Avg:98.7 F (37.1 C), Min:98.2 F (36.8 C), Max:99.1 F (37.3 C)   Recent Labs Lab 12/21/15 0400 12/22/15 0233 12/23/15 0235 12/24/15 0312 12/24/15 0758 12/25/15 0524 12/27/15 1702  WBC 6.6 6.1 6.6  --   --   --   --   CREATININE 0.85 0.84 0.70 0.63  --  0.67 1.07  VANCOTROUGH  --   --   --   --  7*  --  20    Estimated Creatinine Clearance: 89.3 mL/min (by C-G formula based on Cr of 1.07).    Allergies  Allergen Reactions  . Metformin And Related Other (See Comments)    Possible liver inflammation  . Prednisone Other (See Comments)    Disoriented   . Tamsulosin Other (See Comments)    Nose bleed  . Other Other (See Comments)    Reports all narcotics make him sick. Pt premeds with Reglan to stop nausea    Antimicrobials this admission: Vancomycin 3/16>> Zosyn 3/16>>3/20 CTX 3/20>>  Dose adjustments this admission: 3/20 VT = 7 on 1g Q 12 hrs, increased to 1g Q 8hrs  Microbiology results: 3/16 Cervical wound: neg, no anaerobes   Thank you for allowing pharmacy to be a part of this patient's care.  Riki RuskBell, Yilin Weedon Wang 12/27/2015 6:50 PM

## 2015-12-27 NOTE — Progress Notes (Signed)
Noted Cortrak placed and tube placement confirmed by xray. I discussed with Marissa NestlePam Love, PA and contacted Dr. Jeral FruitBotero to confirm pt can be dc'd to CIR today. I will make the arrangements to admit today. Pt and fiance are in agreement. [phone]

## 2015-12-27 NOTE — Progress Notes (Signed)
Standley Brooking, RN Rehab Admission Coordinator Addendum Physical Medicine and Rehabilitation PMR Pre-admission 12/26/2015 12:41 PM  Related encounter: ED to Hosp-Admission (Discharged) from 12/20/2015 in [facility] 5 [facility]EN[facility]O SURGICAL    Expand All Collapse All   PMR Admission Coordinator Pre-Admission Assessment  Patient: William [name] is an 60 y.o., male MRN: [id number] DOB: 1956-08-26 Height:  (177.8 cm) Weight: 105.5 kg (232 lb 9.4 oz)  Insurance Information HMO: PPO: PCP: IPA: 80/20: OTHER:  PRIMARY: Medicaid [place] Access Policy#: [id number] q Subscriber: pt Benefits: Phone #: [phone] Name: 12/26/15 Eff. Date: active   Medicaid Application Date: Case Manager:  Disability Application Date: Case Worker:   Emergency Contact Information Contact Information    Name Relation Home Work Mobile   [place] Friend [phone]       Current Medical History  Patient Admitting Diagnosis: central cord syndrome  History of Present Illness: William [name] is a 60 y.o. male with history of HTN, DMT2, Hep C, cervical stenosis with myelopathy s/p ACDF C3-C7 12/05/15 with post post op near complete loss of shoulder abduction and marked bilateral biceps weakness but improvement in overall neurologic deficits. He was admitted on 12/20/15 with neck swelling, inability to swallow, fever and erythema of neck/chest. CT neck done revealing large abscess 14 cm X 6 cm from skin surface to hardware with edema causing severe airway compromise. He was taken to OR for exploration of wound with drainage of abscess and irrigation of esophagus by Dr. [name] on the same day. He remained intubated thorough 3/19 and respiratory status has been stable post extubation. ID  consulted for input on antibiotic regimen and recommended continuing Vancomycin/Ceftriaxone till April 27th with twice weekly BMP and Vanc trough. Patient continues to have BUE weakness affecting ability to carry out ADL tasks. Pt failed swallow eval 3/21 and made NPO. Plan for Cortrak enteral feeding tube placement 3/23 and placement confirmed by xray prior to CIR admit. Plans for IV antibiotics per ID recommendations. Pt wants to delay PICC placement until further discussions with ID to follow.  Past Medical History  Past Medical History  Diagnosis Date  . Hypertension   . Diabetes mellitus without complication (HCC)   . Chronic hepatitis C without hepatic coma (HCC)   . History of cocaine use   . Depression   . Arthritis   . History of TIA (transient ischemic attack)   . Hyperlipidemia   . History of migraine headaches   . Obesity, Class II, BMI 35-39.9   . BPH with obstruction/lower urinary tract symptoms   . Balanitis   . Urinary retention   . Tobacco abuse     Family History  family history includes Diabetes in his father and mother; Heart disease in his father and mother; Hypertension in his father and mother; Thyroid disease in his mother.  Prior Rehab/Hospitalizations:  Has the patient had major surgery during 100 days prior to admission? Yes  Current Medications   Current facility-administered medications:  . 0.9 % sodium chloride infusion, , Intravenous, Continuous, Leslye Peer, MD, Last Rate: 10 mL/hr at 12/23/15 0830 . acetaminophen (TYLENOL) tablet 650 mg, 650 mg, Oral, Q4H PRN **OR** acetaminophen (TYLENOL) suppository 650 mg, 650 mg, Rectal, Q4H PRN, Hilda Lias, MD . amLODipine (NORVASC) tablet 10 mg, 10 mg, Oral, Daily, Lupita Leash, MD, 10 mg at 12/25/15 0916 . cefTRIAXone (ROCEPHIN) 2 g in dextrose 5 % 50 mL IVPB, 2 g, Intravenous, Q24H, Gardiner Barefoot, MD, 2 g at 12/26/15 1013 .  fentaNYL  (SUBLIMAZE) injection 25 mcg, 25 mcg, Intravenous, Q2H PRN, Vilinda Blanks Minor, NP, 25 mcg at 12/27/15 0921 . gabapentin (NEURONTIN) capsule 100 mg, 100 mg, Oral, TID, Nelda Bucks, MD, 100 mg at 12/25/15 0916 . heparin injection 5,000 Units, 5,000 Units, Subcutaneous, 3 times per day, Nelda Bucks, MD, 5,000 Units at 12/26/15 2206 . HYDROmorphone (DILAUDID) injection 0.25-0.5 mg, 0.25-0.5 mg, Intravenous, Q5 min PRN, Gaynelle Adu, MD . insulin aspart (novoLOG) injection 0-15 Units, 0-15 Units, Subcutaneous, 6 times per day, Rahul P Desai, PA-C, 3 Units at 12/27/15 1247 . insulin glargine (LANTUS) injection 25 Units, 25 Units, Subcutaneous, QHS, Nelda Bucks, MD, 25 Units at 12/26/15 2350 . lactulose (CHRONULAC) 10 GM/15ML solution 30 g, 30 g, Oral, BID, Nelda Bucks, MD, 30 g at 12/25/15 0916 . menthol-cetylpyridinium (CEPACOL) lozenge 3 mg, 1 lozenge, Oral, PRN **OR** phenol (CHLORASEPTIC) mouth spray 1 spray, 1 spray, Mouth/Throat, PRN, Hilda Lias, MD . morphine 2 MG/ML injection 1-4 mg, 1-4 mg, Intravenous, Q3H PRN, Hilda Lias, MD . ondansetron [facility]) injection 4 mg, 4 mg, Intravenous, Q4H PRN, Hilda Lias, MD, 4 mg at 12/27/15 0920 . ondansetron (ZOFRAN) injection 4 mg, 4 mg, Intravenous, Q6H PRN, Vilinda Blanks Minor, NP, 4 mg at 12/23/15 1048 . pantoprazole (PROTONIX) EC tablet 40 mg, 40 mg, Oral, Daily, Herby Abraham, RPH, 40 mg at 12/25/15 0916 . potassium chloride SA (K-DUR,KLOR-CON) CR tablet 20 mEq, 20 mEq, Oral, BID, Hilda Lias, MD, 20 mEq at 12/25/15 2255 . promethazine (PHENERGAN) injection 12.5 mg, 12.5 mg, Intravenous, Q4H PRN, Vilinda Blanks Minor, NP, 12.5 mg at 12/26/15 2351 . vancomycin (VANCOCIN) IVPB 1000 mg/200 mL premix, 1,000 mg, Intravenous, Q8H, Herby Abraham, RPH, 1,000 mg at 12/27/15 1045  Patients Current Diet: Diet NPO time specified Except for: Ice Chips  Cortrak enteral feeding placement pending  12/26/15  Precautions / Restrictions Precautions Precautions: Cervical Precaution Comments: Pt is wearing no brace in bed and applies with sitting up in bed Cervical Brace: Soft collar, At all times Restrictions Weight Bearing Restrictions: No   Has the patient had 2 or more falls or a fall with injury in the past year?No  Prior Activity Level Limited Community (1-2x/wk): Disabled but independent prior to first surgery. No license so did not drive  Journalist, newspaper / Equipment Home Assistive Devices/Equipment: CBG Meter Home Equipment: Crutches, Bedside commode, Shower seat (shower seat does not fit in tub)  Prior Device Use: Indicate devices/aids used by the patient prior to current illness, exacerbation or injury? None of the above  Prior Functional Level Prior Function Level of Independence: Needs assistance (since first surgery) Gait / Transfers Assistance Needed: pt hel onto his fiance to mobilize since first surgery ADL's / Homemaking Assistance Needed: Since sx on 12/05/2015 pt has been totally dependent with basic ADLs and IADLs Communication / Swallowing Assistance Needed: yes Comments: Per pt report was on disability for Lt knee arthritis and previously because of brain tumor (from chart 2 weeks ago)  Self Care: Did the patient need help bathing, dressing, using the toilet or eating? Independent. Was independent prior to initial ACDF.  Indoor Mobility: Did the patient need assistance with walking from room to room (with or without device)? Independent  Stairs: Did the patient need assistance with internal or external stairs (with or without device)? Independent  Functional Cognition: Did the patient need help planning regular tasks such as shopping or remembering to take medications? Independent  Current Functional Level Cognition  Overall  Cognitive Status: Within Functional Limits for tasks assessed Orientation Level: Oriented X4   Extremity  Assessment (includes Sensation/Coordination)  Upper Extremity Assessment: (BUEs not functional at this time due to weakness esp noted in shoulder elevation, bicep flexion,.) RUE Deficits / Details: Pt able to elevate shoulder against gravity. Limited elbow and shoulder flexion against gravity. Full activeextension, wrist and hand WFL. PROM overall WFL. Good grip strength. Strength 2-/5 shoulder and elbow flexion/ext, elbow flex/ext. No changes in sensation reported. Fine motor coordination WFL. RUE Coordination: decreased gross motor LUE Deficits / Details: Pt able to elevate shoulder against gravity. Cannot flex elbow or shoulder against gravity without total A with tendency to shoulder elevate to try to achieve these movements. Full active extension for shoulder and elbow extension when weight of arm held. Decreased forearm supinatin, full AROM for pronation. WFL wrist flexion/extension and grip./ext, elbow flex/ext. LUE Coordination: decreased gross motor  Lower Extremity Assessment: LLE deficits/detail LLE Deficits / Details: Grossly ~4/5 throughout with giveway during MMT.     ADLs  Overall ADL's : Needs assistance/impaired Eating/Feeding: Maximal assistance, Sitting Eating/Feeding Details (indicate cue type and reason): Pt feed himself 75% of his meal with increased time with weight of arm fully supported and him focusing on bringing food/drink to mouth NOT bringing mouth to food/drink. It took him approximately 35 minutes to eat 3/4 of Jello and drink 1/3 of his liquids Grooming: Wash/dry hands, Wash/dry face, Sitting (shaving) Grooming Details (indicate cue type and reason): Pt max A with full weight of arm supported to wash face with RUE. Pt max A with LUE and RUE full weight of arm supported to shave with electric razor and safety razor half of his face/neck. Pt able to use gel for hands once gel put in his hands and hands in lap (not able to raise arms off of his lap  independently Lower Body Dressing Details (indicate cue type and reason): Pt able to cross legs to get to feet. He could doff right sock and don 1/2 way with right sock (once it was halfway started on his foot) with LUE. He could only have doff and don left sock with RUE General ADL Comments: Pt currently total A for all basic ADLs due to decreased use of his arms, sit<>stand min A    Mobility  Overal bed mobility: Needs Assistance Bed Mobility: Supine to Sit, Sit to Supine Supine to sit: Min guard Sit to supine: Min guard General bed mobility comments: close guard for safety. Increased time. HOB elevated. Limited use of UEs due to weakness.     Transfers  Overall transfer level: Needs assistance Equipment used: None, 4-wheeled walker Transfers: Sit to/from Stand Sit to Stand: Min guard Stand pivot transfers: Min guard General transfer comment: close guard for safety. Limited use from UEs due to weakness.     Ambulation / Gait / Stairs / Wheelchair Mobility  Ambulation/Gait Ambulation/Gait assistance: Hydrographic surveyorMin guard Ambulation Distance (Feet): 200 Feet Assistive device: 4-wheeled walker Gait Pattern/deviations: Step-through pattern, Decreased stride length General Gait Details: close guard for safety. 4 wheeled walker appears to be helpful. Pt reports he may have to have surgery on L knee at some point.  Gait velocity: reduced Gait velocity interpretation: Below normal speed for age/gender    Posture / Balance Balance Overall balance assessment: Needs assistance Sitting-balance support: Feet supported, No upper extremity supported Sitting balance-Leahy Scale: Good Standing balance support: During functional activity Standing balance-Leahy Scale: Fair Standing balance comment: fair    Special needs/care  consideration Skin surgical incision Bowel mgmt: incontinent new this admission Bladder mgmt: some incontinence new this admission Cortrak enteral feeding placement  pending 12/26/15 Yonker suction prn at bedside Cervical soft collar    Previous Home Environment Living Arrangements: Spouse/significant other (fiance Trish) Lives With: Significant other Available Help at Discharge: Family, Available 24 hours/day Type of Home: Apartment Home Layout: One level Home Access: Level entry Bathroom Shower/Tub: Engineer, manufacturing systems: Standard Bathroom Accessibility: Yes How Accessible: Accessible via walker Home Care Services: No  Discharge Living Setting Plans for Discharge Living Setting: Patient's home, Lives with (comment) (fiance, Trish) Type of Home at Discharge: Apartment Discharge Home Layout: One level Discharge Home Access: Level entry Discharge Bathroom Shower/Tub: Tub/shower unit Discharge Bathroom Toilet: Standard Discharge Bathroom Accessibility: Yes How Accessible: Accessible via walker Does the patient have any problems obtaining your medications?: No  Social/Family/Support Systems Patient Roles: Partner Contact Information: Elease Hashimoto "Cabin crew, fiance Anticipated Caregiver: fiance Anticipated Caregiver's Contact Information: see above Ability/Limitations of Caregiver: no limitations Caregiver Availability: 24/7 Discharge Plan Discussed with Primary Caregiver: Yes Is Caregiver In Agreement with Plan?: Yes Does Caregiver/Family have Issues with Lodging/Transportation while Pt is in Rehab?: No (Trish stays with pt in hospital 24/7)  Goals/Additional Needs Patient/Family Goal for Rehab: supervision with PT, OT, and SLP Expected length of stay: ELOS 10-14 days Dietary Needs: NPO with Cortrak enteral feeding Pt/Family Agrees to Admission and willing to participate: Yes Program Orientation Provided & Reviewed with Pt/Caregiver Including Roles & Responsibilities: Yes  Decrease burden of Care through IP rehab admission: n/a  Possible need for SNF placement upon discharge: not anticipated  Patient Condition: This  patient's medical and functional status has changed since the consult dated: 12/24/2015 in which the Rehabilitation Physician determined and documented that the patient's condition is appropriate for intensive rehabilitative care in an inpatient rehabilitation facility. See "History of Present Illness" (above) for medical update. Functional changes are: min to mod assist. Patient's medical and functional status update has been discussed with the Rehabilitation physician and patient remains appropriate for inpatient rehabilitation. Will admit to inpatient rehab today.  Preadmission Screen Completed By: Clois Dupes, 12/27/2015 2:26 PM ______________________________________________________________________  Discussed status with Dr.Kirsteins on 12/27/2015 at 1425 and received telephone approval for admission today.  Admission Coordinator: Clois Dupes, time 1610 Date 12/27/2015.          Cosigned by: Erick Colace, MD at 12/27/2015 2:50 PM  Revision History     Date/Time User Provider Type Action   12/27/2015 2:50 PM Erick Colace, MD Physician Cosign   12/27/2015 2:33 PM Standley Brooking, RN Rehab Admission Coordinator Addend   12/27/2015 2:26 PM Standley Brooking, RN Rehab Admission Coordinator Addend   12/27/2015 11:03 AM Standley Brooking, RN Rehab Admission Coordinator Incomplete Revision   12/26/2015 1:25 PM Ankit Karis Juba, MD Physician Cosign   12/26/2015 12:49 PM Standley Brooking, RN Rehab Admission Coordinator Sign   View Details Report

## 2015-12-27 NOTE — Progress Notes (Signed)
[facility] P[facility] order; she states that the patient is still a little hesitant about having PICC placed. Ines BloomerShawn is going to f/u with MD about contiued PICC necessity and then contact our PICC team.

## 2015-12-27 NOTE — Interval H&P Note (Signed)
William [name] was admitted today to Inpatient Rehabilitation with the diagnosis of cervical myelopathy and bilateral C5 Radic.  The patient's history has been reviewed, patient examined, and there is no change in status.  Patient continues to be appropriate for intensive inpatient rehabilitation.  I have reviewed the patient's chart and labs.  Questions were answered to the patient's satisfaction. The PAPE has been reviewed and assessment remains appropriate.  Erick ColaceKIRSTEINS,ANDREW E 12/27/2015, 7:46 PM

## 2015-12-27 NOTE — Progress Notes (Signed)
Spoke with MD on call regarding pt's potassium level from labs and informed MD of pt's NPO status. MD notified and gave verbal orders by phone for IV potassium. IR called to inform RN that patient will also be getting lab stat and asked to check NG tube function before transfer.

## 2015-12-27 NOTE — Progress Notes (Signed)
Physical Therapy Treatment Patient Details Name: William [name] MRN: [id number] DOB: 1956-06-26 Today's Date: 12/27/2015    History of Present Illness William [name] on 12/05/15 had a 4 level acdf. Woke up with c5 radiculopathy. Saw rehab medicine. Since then he has been complaining of swelling, unable to swallow,fever, redness of neck area and chest. Seen in the er , a ct neck showed most likely an abscess 3/16 pt underwent cervical exploration with removal of hematoma and cleaning out of abcess.    PT Comments    Pt continues to participate well with session. Bil UE weakness still present. Continues to recommend CIR for continued PT/OT.   Follow Up Recommendations  CIR     Equipment Recommendations   (4 wheeled walker)    Recommendations for Other Services       Precautions / Restrictions Precautions Precautions: Cervical Precaution Comments: Pt is wearing no brace in bed and applies with sitting up in bed Required Braces or Orthoses: Cervical Brace Cervical Brace: Soft collar;At all times Restrictions Weight Bearing Restrictions: No    Mobility  Bed Mobility Overal bed mobility: Needs Assistance Bed Mobility: Supine to Sit;Sit to Supine     Supine to sit: Min guard Sit to supine: Min guard   General bed mobility comments: close guard for safety. Increased time. HOB elevated. Limited use of UEs due to weakness.   Transfers Overall transfer level: Needs assistance Equipment used: None;4-wheeled walker Transfers: Sit to/from Stand Sit to Stand: Min guard         General transfer comment: close guard for safety. Limited use from UEs due to weakness.   Ambulation/Gait Ambulation/Gait assistance: Min guard Ambulation Distance (Feet): 200 Feet Assistive device: 4-wheeled walker Gait Pattern/deviations: Step-through pattern;Decreased stride length     General Gait Details: close guard for safety. 4 wheeled walker appears to be helpful. Pt reports he may have to have  surgery on L knee at some point.    Stairs            Wheelchair Mobility    Modified Rankin (Stroke Patients Only)       Balance           Standing balance support: During functional activity Standing balance-Leahy Scale: Fair                      Cognition Arousal/Alertness: Awake/alert Behavior During Therapy: WFL for tasks assessed/performed Overall Cognitive Status: Within Functional Limits for tasks assessed                      Exercises      General Comments        Pertinent Vitals/Pain Pain Assessment: Faces Faces Pain Scale: Hurts little more Pain Location: R UE Pain Intervention(s): Premedicated before session    Home Living                      Prior Function            PT Goals (current goals can now be found in the care plan section) Progress towards PT goals: Progressing toward goals    Frequency  Min 3X/week    PT Plan Current plan remains appropriate    Co-evaluation             End of Session Equipment Utilized During Treatment: Gait belt;Cervical collar Activity Tolerance: Patient tolerated treatment well Patient left: in bed;with call bell/phone within reach;with bed alarm set  Time: [id number] PT Time Calculation (min) (ACUTE ONLY): 22 min  Charges:  $Gait Training: 8-22 mins                    G Codes:      Rebeca AlertJannie Dulse Rutan, MPT Pager: [phone]

## 2015-12-27 NOTE — Progress Notes (Signed)
Ankit Karis Juba, MD Physician Signed Physical Medicine and Rehabilitation Consult Note 12/24/2015 10:55 AM  Related encounter: ED to Hosp-Admission (Discharged) from 12/20/2015 in [facility]    Expand All Collapse All        Physical Medicine and Rehabilitation Consult  Reason for Consult: Central cord syndrome Referring Physician: Dr. [name]   HPI: William [name] is a 60 y.o. male with history of HTN, DMT2, Hep C, cervical stenosis with myelopathy s/p ACDF C3-C7 12/05/15 with post post op near complete loss of shoulder abduction and marked bilateral biceps weakness but improvement in overall neurologic deficits. He was admitted on 12/20/15 with neck swelling, inability to swallow, fever and erythema of neck/chest. CT neck done revealing large abscess 14 cm X 6 cm from skin surface to hardware with edema causing severe airway compromise. He was taken to OR for exploration of wound with drainage of abscess and irrigation of esophagus by Dr. [name] on the same day. He remained intubated thorough 3/19 and respiratory status has been stable post extubation. ID consulted for input on antibiotic regimen and recommended continuing Vancomycin/Ceftriaxone till April 27th with twice weekly BMP and Vanc trough. Patient continues to have BUE weakness affecting ability to carry out ADL tasks. CIR recommended for follow up therapy.    Review of Systems  HENT: Negative for hearing loss.  Eyes: Negative for blurred vision and double vision.  Respiratory: Negative for cough and sputum production.  Gastrointestinal: Positive for constipation. Negative for nausea.  Genitourinary: Negative for dysuria and urgency.  Musculoskeletal: Positive for neck pain. Negative for back pain and joint pain.  Neurological: Positive for sensory change and focal weakness. Negative for dizziness, speech change, weakness and headaches.  Psychiatric/Behavioral: Positive for memory loss.  Negative for depression.  All other systems reviewed and are negative.     Past Medical History  Diagnosis Date  . Hypertension   . Diabetes mellitus without complication (HCC)   . Chronic hepatitis C without hepatic coma (HCC)   . History of cocaine use   . Depression   . Arthritis   . History of TIA (transient ischemic attack)   . Hyperlipidemia   . History of migraine headaches   . Obesity, Class II, BMI 35-39.9   . BPH with obstruction/lower urinary tract symptoms   . Balanitis   . Urinary retention   . Tobacco abuse     Past Surgical History  Procedure Laterality Date  . Rotator cuff surgery       left shoulder   . Spider bite      on neck  . Anterior cervical decomp/discectomy fusion N/A 12/05/2015    Procedure: Anterior Cervical Decompression Fusion Cervical three-four, Cervical four-five, Cervical five-six, Cervical six-seven; Surgeon: Julio Sicks, MD; Location: [facility]; Service: Neurosurgery; Laterality: N/A;  . Anterior cervical decompression for epidural abscess N/A 12/20/2015    Procedure: ANTERIOR CERVICAL DECOMPRESSION FOR EPIDURAL ABSCESS; Surgeon: Hilda Lias, MD; Location: [facility]; Service: Neurosurgery; Laterality: N/A; ANTERIOR CERVICAL DECOMPRESSION FOR EPIDURAL ABSCESS    Family History  Problem Relation Age of Onset  . Hypertension Mother   . Diabetes Mother   . Heart disease Mother   . Thyroid disease Mother   . Hypertension Father   . Diabetes Father   . Heart disease Father     Social History: Married. Wife at home and patient has been needing assistance with ADL tasks since last surgery. Has been disabled due to knee OA.  He reports that he quit smoking couple of months ago. He has been smoking about 3 cigarettes /day since age 26. He has a 3 pack-year smoking history. He does not have any smokeless tobacco  history on file. He reports that he drinks alcohol. He reports that he uses illicit drugs.    Allergies  Allergen Reactions  . Metformin And Related Other (See Comments)    Possible liver inflammation  . Prednisone Other (See Comments)    Disoriented   . Tamsulosin Other (See Comments)    Nose bleed  . Other Other (See Comments)    Reports all narcotics make him sick. Pt premeds with Reglan to stop nausea    Medications Prior to Admission  Medication Sig Dispense Refill  . amLODipine (NORVASC) 10 MG tablet Take 1 tablet (10 mg total) by mouth daily. 30 tablet 3  . BD PEN NEEDLE NANO U/F 32G X 4 MM MISC USE WITH FLEXPEN TO ADMINISTER INSULIN 100 each 5  . cyclobenzaprine (FLEXERIL) 10 MG tablet Take 1 tablet (10 mg total) by mouth 3 (three) times daily as needed for muscle spasms. 60 tablet 3  . diphenhydrAMINE (BENADRYL) 25 MG tablet Take 1 tablet (25 mg total) by mouth every 6 (six) hours. As needed with Compazine for headache. (Patient taking differently: Take 25 mg by mouth every 8 (eight) hours as needed for itching or allergies. As needed with Compazine for headache.) 20 tablet 0  . Elastic Bandages & Supports (THUMB BRACE) MISC 1 Device by Does not apply route daily as needed. 1 each 0  . fluconazole (DIFLUCAN) 150 MG tablet TAKE 1 TABLET (150 MG TOTAL) BY MOUTH EVERY 7 (SEVEN) DAYS. (Patient taking differently: TAKE 1 TABLET (150 MG TOTAL) BY MOUTH EVERY 7 (SEVEN) DAYS on Monday) 4 tablet 0  . gabapentin (NEURONTIN) 100 MG capsule Take 1 capsule (100 mg total) by mouth 3 (three) times daily. 90 capsule 3  . glucose blood (ACCU-CHEK AVIVA) test strip Use as instructed to check blood glucose 4x/day 200 each 12  . insulin aspart (NOVOLOG FLEXPEN) 100 UNIT/ML FlexPen Inject 15 Units into the skin 3 (three) times daily with meals. (Patient taking differently: Inject 25 Units into the skin 3 (three) times daily  with meals. ) 8 pen 5  . Insulin Glargine (LANTUS SOLOSTAR) 100 UNIT/ML Solostar Pen Inject 30 Units into the skin 2 (two) times daily. (Patient taking differently: Inject 50 Units into the skin daily at 10 pm. ) 6 pen 2  . Insulin Pen Needle (EASY TOUCH PEN NEEDLES) 32G X 6 MM MISC Use with Flexpen to administer Insulin 100 each 11  . Lancets (FREESTYLE) lancets Use as instructed to check blood glucose 4x/day 200 each 12  . metoCLOPramide (REGLAN) 10 MG tablet Take 1 tablet (10 mg total) by mouth 3 (three) times daily as needed for nausea (headache / nausea). 20 tablet 0  . oxyCODONE-acetaminophen (PERCOCET/ROXICET) 5-325 MG tablet Take 1-2 tablets by mouth every 6 (six) hours as needed for severe pain. 90 tablet 0  . pantoprazole (PROTONIX) 40 MG tablet Take 1 tablet (40 mg total) by mouth 2 (two) times daily. Twice a day for 10 days; then start using it once a day (Patient taking differently: Take 40 mg by mouth daily. ) 60 tablet 1  . sucralfate (CARAFATE) 1 GM/10ML suspension Take 10 mLs (1 g total) by mouth 4 (four) times daily - with meals and at bedtime. 420 mL 1    Home: Home Living Family/patient  expects to be discharged to:: Inpatient rehab Living Arrangements: Spouse/significant other Available Help at Discharge: Family, Available 24 hours/day Type of Home: Apartment Home Access: Level entry Home Layout: One level Bathroom Shower/Tub: Engineer, manufacturing systemsTub/shower unit Bathroom Toilet: Standard Home Equipment: Crutches, Bedside commode, Shower seat (shower seat does not fit his tub)  Functional History: Prior Function Level of Independence: Needs assistance Gait / Transfers Assistance Needed: Pt has been independent with basic ambulation ADL's / Homemaking Assistance Needed: Since sx on 12/05/2015 pt has been totally dependent with basic ADLs and IADLs Comments: Per pt report was on disability for Lt knee arthritis and previously because of brain tumor (from  chart 2 weeks ago) Functional Status:  Mobility: Bed Mobility Overal bed mobility: Needs Assistance Bed Mobility: Supine to Sit Supine to sit: Min guard General bed mobility comments: raised straight up into long sitting to then move legs off of bed  Transfers Overall transfer level: Needs assistance Equipment used: 1 person hand held assist Transfers: Sit to/from Stand, Stand Pivot Transfers Sit to Stand: Min assist Stand pivot transfers: Min assist      ADL: ADL Overall ADL's : Needs assistance/impaired Eating/Feeding: Maximal assistance, Sitting Eating/Feeding Details (indicate cue type and reason): Pt feed himself 75% of his meal with increased time with weight of arm fully supported and him focusing on bringing food/drink to mouth NOT bringing mouth to food/drink. It took him approximately 35 minutes to eat 3/4 of Jello and drink 1/3 of his liquids Grooming: Wash/dry hands, Wash/dry face, Sitting (shaving) Grooming Details (indicate cue type and reason): Pt max A with full weight of arm supported to wash face with RUE. Pt max A with LUE and RUE full weight of arm supported to shave with electric razor and safety razor half of his face/neck. Pt able to use gel for hands once gel put in his hands and hands in lap (not able to raise arms off of his lap independently Lower Body Dressing Details (indicate cue type and reason): Pt able to cross legs to get to feet. He could doff right sock and don 1/2 way with right sock (once it was halfway started on his foot) with LUE. He could only have doff and don left sock with RUE General ADL Comments: Pt currently total A for all basic ADLs due to decreased use of his arms, sit<>stand min A  Cognition: Cognition Overall Cognitive Status: Within Functional Limits for tasks assessed Orientation Level: Oriented X4 Cognition Arousal/Alertness: Awake/alert Behavior During Therapy: WFL for tasks assessed/performed Overall Cognitive Status: Within  Functional Limits for tasks assessed   Blood pressure 138/90, pulse 122, temperature 98.1 F (36.7 C), temperature source Oral, resp. rate 22, height 5\' 10"  (1.778 m), weight 105.5 kg (232 lb 9.4 oz), SpO2 95 %. Physical Exam  Nursing note and vitals reviewed. Constitutional: He is oriented to person, place, and time. He appears well-developed and well-nourished.  Sitting up in chair.  HENT:  Head: Normocephalic and atraumatic.  Mouth/Throat: Oropharynx is clear and moist.  Eyes: Conjunctivae and EOM are normal. Pupils are equal, round, and reactive to light.  Neck:  Soft collar in place  Cardiovascular: Regular rhythm. Tachycardia present.  Heart rate 120 at rest.  Respiratory: Effort normal and breath sounds normal. No respiratory distress. He has no wheezes. He exhibits no tenderness.  GI: Soft. Bowel sounds are normal. He exhibits no distension. There is no tenderness.  Musculoskeletal: He exhibits edema. He exhibits no tenderness.  Neurological: He is alert and oriented to  person, place, and time.  Speech clear but kept expectorating saliva due to difficulty handling secretions--denied difficulty swallowing.  Motor: RUE: deltoid 1/5, biceps/triceps 3/5, intrinsics 4+5. LUE deltoid 2+/5, biceps/triceps 2+/5, intrinsics 4+/5. BLE: 5/5 proximal to distal Sensation intact to light touch DTRs symmetric  Skin: Skin is dry. No rash noted.  Psychiatric: He has a normal mood and affect. His behavior is normal.     Lab Results Last 24 Hours    Results for orders placed or performed during the hospital encounter of 12/20/15 (from the past 24 hour(s))  Glucose, capillary Status: Abnormal   Collection Time: 12/23/15 12:20 PM  Result Value Ref Range   Glucose-Capillary 152 (H) 65 - 99 mg/dL  Glucose, capillary Status: Abnormal   Collection Time: 12/23/15 4:05 PM  Result Value Ref Range   Glucose-Capillary 143 (H) 65 - 99 mg/dL  Glucose,  capillary Status: Abnormal   Collection Time: 12/23/15 7:46 PM  Result Value Ref Range   Glucose-Capillary 161 (H) 65 - 99 mg/dL   Comment 1 Notify RN   Glucose, capillary Status: Abnormal   Collection Time: 12/24/15 12:24 AM  Result Value Ref Range   Glucose-Capillary 153 (H) 65 - 99 mg/dL   Comment 1 Notify RN   Basic metabolic panel Status: Abnormal   Collection Time: 12/24/15 3:12 AM  Result Value Ref Range   Sodium 137 135 - 145 mmol/L   Potassium 3.4 (L) 3.5 - 5.1 mmol/L   Chloride 100 (L) 101 - 111 mmol/L   CO2 28 22 - 32 mmol/L   Glucose, Bld 129 (H) 65 - 99 mg/dL   BUN <5 (L) 6 - 20 mg/dL   Creatinine, Ser 1.61 0.61 - 1.24 mg/dL   Calcium 8.8 (L) 8.9 - 10.3 mg/dL   GFR calc non Af Amer >60 >60 mL/min   GFR calc Af Amer >60 >60 mL/min   Anion gap 9 5 - 15  Glucose, capillary Status: Abnormal   Collection Time: 12/24/15 4:17 AM  Result Value Ref Range   Glucose-Capillary 109 (H) 65 - 99 mg/dL   Comment 1 Notify RN   Glucose, capillary Status: Abnormal   Collection Time: 12/24/15 7:43 AM  Result Value Ref Range   Glucose-Capillary 118 (H) 65 - 99 mg/dL  Vancomycin, trough Status: Abnormal   Collection Time: 12/24/15 7:58 AM  Result Value Ref Range   Vancomycin Tr 7 (L) 10.0 - 20.0 ug/mL      Imaging Results (Last 48 hours)    Dg Abd 1 View  12/23/2015 CLINICAL DATA: Nausea and vomiting EXAM: ABDOMEN - 1 VIEW COMPARISON: December 20, 2015 FINDINGS: There is moderate stool throughout the colon. There is no bowel dilatation or air-fluid level suggesting obstruction. No free air. There is atelectatic change in the left lung base region. Early pneumonia in this area cannot be excluded. IMPRESSION: Bowel gas pattern unremarkable without obstruction or free air evident. Atelectasis left base with questionable early pneumonia in this area.  Electronically Signed By: Bretta Bang III M.D. On: 12/23/2015 15:18   Dg Chest Port 1 View  12/23/2015 CLINICAL DATA: Respiratory failure EXAM: PORTABLE CHEST 1 VIEW COMPARISON: Chest radiograph from one day prior. FINDINGS: Endotracheal tube tip is 3.5 cm above the carina. Enteric tube enters the stomach with the tip not seen on this image. Surgical hardware from ACDF and skin staples are partially visualized overlying the lower cervical spine. Stable cardiomediastinal silhouette with top-normal heart size. No pneumothorax. No pleural effusion. Patchy left basilar lung  opacity appears stable. Decreased right basilar atelectasis. No pulmonary edema. IMPRESSION: 1. Well-positioned support hardware as described . 2. Decreased right basilar atelectasis. Stable patchy left basilar lung opacities. Electronically Signed By: Delbert Phenix M.D. On: 12/23/2015 07:37     Assessment/Plan: Diagnosis: Central cord syndrome Labs and images independently reviewed. Records reviewed and summated above.  1. Does the need for close, 24 hr/day medical supervision in concert with the patient's rehab needs make it unreasonable for this patient to be served in a less intensive setting? Potentially  2. Co-Morbidities requiring supervision/potential complications: HTN (monitor and provide prns in accordance with increased physical exertion and pain), DMT2 (Monitor in accordance with exercise and adjust meds as necessary), Hep C (avoid hepatotoxic meds), cervical stenosis with myelopathy s/p surgery, Tachycardia (monitor in accordance with pain and increasing activity), tachypnea (monitor RR and O2 Sats with increased physical exertion), hypokalemia (cont to monitor and replete as necessary recording), abscess s/p surgery (continue abx per ID) 3. Due to safety, skin/wound care, disease management, pain management and patient education, does the patient require 24 hr/day rehab nursing? Yes 4. Does the  patient require coordinated care of a physician, rehab nurse, PT (1-2 hrs/day, 5 days/week) and OT (1-2 hrs/day, 5 days/week) to address physical and functional deficits in the context of the above medical diagnosis(es)? Potentially Addressing deficits in the following areas: balance, endurance, locomotion, strength, transferring, bathing, dressing, feeding, grooming, toileting and psychosocial support 5. Can the patient actively participate in an intensive therapy program of at least 3 hrs of therapy per day at least 5 days per week? Potentially 6. The potential for patient to make measurable gains while on inpatient rehab is TBD 7. Anticipated functional outcomes upon discharge from inpatient rehab are TBD with PT, TBD with OT, n/a with SLP. 8. Estimated rehab length of stay to reach the above functional goals is: TBD. 9. Does the patient have adequate social supports and living environment to accommodate these discharge functional goals? Potentially 10. Anticipated D/C setting: Home 11. Anticipated post D/C treatments: HH therapy and Home excercise program 12. Overall Rehab/Functional Prognosis: good  RECOMMENDATIONS: This patient's condition is appropriate for continued rehabilitative care in the following setting: Patient has not been evaluated by physical therapy as of yet. Prior to recent discharge patient did not have a PT need. Will await formal consult before recommending most appropriate discharge disposition. Patient has agreed to participate in recommended program. Yes Note that insurance prior authorization may be required for reimbursement for recommended care.  Comment: Rehab Admissions Coordinator to follow up.  Maryla Morrow, MD 12/24/2015       Revision History     Date/Time User Provider Type Action   12/24/2015 1:20 PM Ankit Karis Juba, MD Physician Sign   12/24/2015 1:02 PM Ankit Karis Juba, MD Physician Share   12/24/2015 11:34 AM Jacquelynn Cree, PA-C Physician  Assistant Share   View Details Report

## 2015-12-27 NOTE — Progress Notes (Signed)
Patient ID: William [name], male   DOB: [birth year], 60 y.o.   MRN: [id number] Stable. To have a feeding tube before he goes to rehab

## 2015-12-28 ENCOUNTER — Inpatient Hospital Stay (HOSPITAL_COMMUNITY): Payer: Medicaid Other | Admitting: Physical Therapy

## 2015-12-28 ENCOUNTER — Inpatient Hospital Stay (HOSPITAL_COMMUNITY): Payer: Medicaid Other | Admitting: Speech Pathology

## 2015-12-28 ENCOUNTER — Inpatient Hospital Stay (HOSPITAL_COMMUNITY): Payer: Medicaid Other | Admitting: Occupational Therapy

## 2015-12-28 DIAGNOSIS — M5412 Radiculopathy, cervical region: Secondary | ICD-10-CM

## 2015-12-28 DIAGNOSIS — R1314 Dysphagia, pharyngoesophageal phase: Secondary | ICD-10-CM

## 2015-12-28 DIAGNOSIS — L0211 Cutaneous abscess of neck: Secondary | ICD-10-CM

## 2015-12-28 LAB — CBC WITH DIFFERENTIAL/PLATELET
BASOS ABS: 0 10*3/uL (ref 0.0–0.1)
BASOS PCT: 0 %
EOS PCT: 3 %
Eosinophils Absolute: 0.2 10*3/uL (ref 0.0–0.7)
HCT: 42.2 % (ref 39.0–52.0)
Hemoglobin: 13.8 g/dL (ref 13.0–17.0)
LYMPHS PCT: 39 %
Lymphs Abs: 2.3 10*3/uL (ref 0.7–4.0)
MCH: 28.6 pg (ref 26.0–34.0)
MCHC: 32.7 g/dL (ref 30.0–36.0)
MCV: 87.6 fL (ref 78.0–100.0)
MONO ABS: 0.6 10*3/uL (ref 0.1–1.0)
Monocytes Relative: 9 %
Neutro Abs: 3 10*3/uL (ref 1.7–7.7)
Neutrophils Relative %: 49 %
PLATELETS: 316 10*3/uL (ref 150–400)
RBC: 4.82 MIL/uL (ref 4.22–5.81)
RDW: 14.6 % (ref 11.5–15.5)
WBC: 6.1 10*3/uL (ref 4.0–10.5)

## 2015-12-28 LAB — GLUCOSE, CAPILLARY
GLUCOSE-CAPILLARY: 207 mg/dL — AB (ref 65–99)
GLUCOSE-CAPILLARY: 276 mg/dL — AB (ref 65–99)
GLUCOSE-CAPILLARY: 366 mg/dL — AB (ref 65–99)
Glucose-Capillary: 250 mg/dL — ABNORMAL HIGH (ref 65–99)
Glucose-Capillary: 280 mg/dL — ABNORMAL HIGH (ref 65–99)
Glucose-Capillary: 254 mg/dL — ABNORMAL HIGH (ref 65–99)

## 2015-12-28 LAB — COMPREHENSIVE METABOLIC PANEL
ALT: 278 U/L — ABNORMAL HIGH (ref 17–63)
AST: 115 U/L — AB (ref 15–41)
Albumin: 2.9 g/dL — ABNORMAL LOW (ref 3.5–5.0)
Alkaline Phosphatase: 99 U/L (ref 38–126)
Anion gap: 11 (ref 5–15)
BUN: 9 mg/dL (ref 6–20)
CALCIUM: 9.4 mg/dL (ref 8.9–10.3)
CO2: 27 mmol/L (ref 22–32)
CREATININE: 1.01 mg/dL (ref 0.61–1.24)
Chloride: 103 mmol/L (ref 101–111)
GFR calc Af Amer: >60 mL/min (ref >60)
GLUCOSE: 322 mg/dL — AB (ref 65–99)
POTASSIUM: 3.5 mmol/L (ref 3.5–5.1)
Sodium: 141 mmol/L (ref 135–145)
TOTAL PROTEIN: 6.7 g/dL (ref 6.5–8.1)
Total Bilirubin: 1.1 mg/dL (ref 0.3–1.2)

## 2015-12-28 LAB — VANCOMYCIN, TROUGH: VANCOMYCIN TR: 14 ug/mL (ref 10.0–20.0)

## 2015-12-28 MED ORDER — JEVITY 1.2 CAL PO LIQD
1000.0000 mL | ORAL | Status: DC
Start: 2015-12-28 — End: 2015-12-28

## 2015-12-28 MED ORDER — SODIUM CHLORIDE 0.9% FLUSH
10.0000 mL | Freq: Two times a day (BID) | INTRAVENOUS | Status: DC
  Administered 2016-01-03 – 2016-01-04 (×2): 10 mL

## 2015-12-28 MED ORDER — CHLORHEXIDINE GLUCONATE 0.12 % MT SOLN
15.0000 mL | Freq: Two times a day (BID) | OROMUCOSAL | Status: DC
  Administered 2015-12-28 – 2016-01-02 (×10): 15 mL via OROMUCOSAL
  Filled 2015-12-28 (×9): qty 15

## 2015-12-28 MED ORDER — INSULIN GLARGINE 100 UNIT/ML ~~LOC~~ SOLN
28.0000 [IU] | Freq: Every day | SUBCUTANEOUS | Status: DC
  Administered 2015-12-28 – 2015-12-30 (×3): 28 [IU] via SUBCUTANEOUS
  Filled 2015-12-28 (×5): qty 0.28

## 2015-12-28 MED ORDER — PRO-STAT SUGAR FREE PO LIQD
30.0000 mL | Freq: Every day | ORAL | Status: DC
  Administered 2015-12-28 – 2016-01-01 (×5): 30 mL
  Filled 2015-12-28 (×6): qty 30

## 2015-12-28 MED ORDER — VANCOMYCIN HCL IN DEXTROSE 1-5 GM/200ML-% IV SOLN
1000.0000 mg | Freq: Three times a day (TID) | INTRAVENOUS | Status: DC
  Administered 2015-12-29 – 2015-12-30 (×4): 1000 mg via INTRAVENOUS
  Filled 2015-12-28 (×7): qty 200

## 2015-12-28 MED ORDER — GLUCERNA 1.2 CAL PO LIQD
1000.0000 mL | ORAL | Status: DC
  Administered 2015-12-29 – 2015-12-31 (×4): 1000 mL
  Filled 2015-12-28 (×10): qty 1000

## 2015-12-28 MED ORDER — SODIUM CHLORIDE 0.9% FLUSH
10.0000 mL | INTRAVENOUS | Status: DC | PRN
  Administered 2015-12-28 – 2016-01-02 (×4): 10 mL
  Filled 2015-12-28 (×4): qty 40

## 2015-12-28 NOTE — Progress Notes (Signed)
[facility]     PROGRESS NOTE    Subjective/Complaints: Doing quite well this morning. Had some difficulties falling asleep given the change in scenery. No sob. Coughing secretions up and suctioning. Mild neck pain  ROS: Pt denies fever, rash/itching, headache, blurred or double vision, nausea, vomiting, abdominal pain, diarrhea, chest pain, shortness of breath, palpitations, dysuria, dizziness, back pain, bleeding, anxiety, or depression   Objective: Vital Signs: Blood pressure 136/83, pulse 121, temperature 98.8 F (37.1 C), temperature source Oral, resp. rate 20, SpO2 96 %. Dg Abd Portable 1v  12/27/2015  CLINICAL DATA:  Feeding tube placement EXAM: PORTABLE ABDOMEN - 1 VIEW COMPARISON:  December 23, 2015 FINDINGS: Feeding tube tip is in the body of the stomach. There is contrast in the colon. Bowel gas pattern is normal. No obstruction or free air. IMPRESSION: Feeding tube tip in body of stomach. Bowel gas pattern unremarkable. Electronically Signed   By: Bretta BangWilliam  Woodruff III M.D.   On: 12/27/2015 14:07   No results for input(s): WBC, HGB, HCT, PLT in the last 72 hours.  Recent Labs  12/27/15 1702 12/27/15 1950  NA 140  --   K 3.3*  --   CL 102  --   GLUCOSE 183*  --   BUN 9  --   CREATININE 1.07 1.09  CALCIUM 9.3  --    CBG (last 3)   Recent Labs  12/28/15 0049 12/28/15 0451 12/28/15 0736  GLUCAP 207* 250* 280*    Wt Readings from Last 3 Encounters:  12/24/15 105.5 kg (232 lb 9.4 oz)  12/05/15 108.727 kg (239 lb 11.2 oz)  11/29/15 109.2 kg (240 lb 11.9 oz)    Physical Exam:  HEENT has a nasal feeding tube. Staples in neck, incision cdi General: No acute distress Mood and affect are appropriate Heart: Regular rate and rhythm no rubs murmurs or rubs Lungs: Clear to auscultation, breathing unlabored, no rales or wheezes Abdomen: Positive bowel sounds, soft nontender to palpation, nondistended Extremities: No clubbing, cyanosis,  or edema Skin: No evidence of breakdown, no evidence of rash Neurologic: Cranial nerves II through XII intact, motor strength is 2+/5 in bilateral deltoid,3+ bicep, 3+ tricep, 4 grip,4+ hip flexor, knee extensors, ankle dorsiflexor and plantar flexor Sensory exam normal sensation to light touch in bilateral upper and lower extremities Cerebellar exam normal finger to nose to finger as well as heel to shin in bilateral upper and lower extremities Musculoskeletal: Full range of motion in all 4 extremities. No joint swelling, Mild dorsum of the hand swelling/edema, nontender no erythema Deep tendon reflexes are hyporeflexic bilateral upper and lower limbs Cognitively he's appropriate Psych: pleasant and appropriate    Assessment/Plan: 1. Weakness and mobility/functional deficits secondary to cervical myelopathy/C5 radiculopathy which require 3+ hours per day of interdisciplinary therapy in a comprehensive inpatient rehab setting. Physiatrist is providing close team supervision and 24 hour management of active medical problems listed below. Physiatrist and rehab team continue to assess barriers to discharge/monitor patient progress toward functional and medical goals.  Function:  Bathing Bathing position      Bathing parts      Bathing assist        Upper Body Dressing/Undressing Upper body dressing                    Upper body assist        Lower Body Dressing/Undressing Lower body dressing  Lower body assist        Toileting Toileting          Toileting assist     Transfers Chair/bed transfer             Locomotion Ambulation           Wheelchair          Cognition Comprehension Comprehension assist level: Follows basic conversation/direction with no assist  Expression Expression assist level: Expresses basic needs/ideas: With no assist  Social Interaction Social Interaction assist level:  Interacts appropriately 90% of the time - Needs monitoring or encouragement for participation or interaction.  Problem Solving Problem solving assist level: Solves basic problems with no assist  Memory Memory assist level: Recognizes or recalls 90% of the time/requires cueing < 10% of the time  Medical Problem List and Plan: 1. Cervical myelopathy and bilateral C5 radiculopathy secondary to Cervical spinal stenosis and postoperative abscess 2. DVT Prophylaxis/Anticoagulation: Pharmaceutical: Heparin indicated 3. Pain Management:  oxycodone 1 mg per mL, 5-10 mL every 4 hours when necessary 4. Mood: Overt signs of depression, will monitor for adjustment disorder, neuropsychologist evaluation as necessary 5. Neuropsych: This patient is capable of making decisions on his own behalf. 6. Skin/Wound Care: monitor wound for healing 7. Fluids/Electrolytes/Nutrition: Monitor I/O. Check lytes today  -continue NGT feedings 8. HTN: Monitor BP bid.  9. DMT2: Monitor BS ac/hs.Continue lantus 25 units at bedtime. Use SSI   -recent trend upward. Increase to 28u qhs 10. Neck abscess: To continue Vanc  and Ceftriaxone 2 grams daily till April 27th-He will need to go home on IV antibiotics and a more permanent IV access line would need to be placed 11. Dysphagia with pharyngeal edema--continue NGT for now  -clinically showing signs of improvement  -advance per SLP recs     LOS (Days) 1 A FACE TO FACE EVALUATION WAS PERFORMED  SWARTZ,ZACHARY T 12/28/2015 9:09 AM

## 2015-12-28 NOTE — Evaluation (Signed)
Physical Therapy Assessment and Plan  Patient Details  Name: William [name] MRN: [id number] Date of Birth: 05-21-1956  PT Diagnosis: Abnormality of gait, Difficulty walking, Muscle weakness and Paraplegia Rehab Potential: Good ELOS: 7-10 days   Today's Date: 12/28/2015 PT Individual Time: 1000-1055 PT Individual Time Calculation (min): 55 min    Problem List:  Patient Active Problem List   Diagnosis Date Noted  . Central cord syndrome ([place]) 12/27/2015  . Uncontrolled type 2 diabetes mellitus with complication, with long-term current use of insulin ([place])   . Chronic hepatitis C without hepatic coma ([place])   . H/O cervical spine surgery   . Tachycardia   . Tachypnea   . Hypokalemia   . Encounter for nasogastric (NG) tube placement   . Neck abscess   . Brodie's abscess of cervical spine ([place]) 12/20/2015  . Abscess, neck   . Endotracheal tube present   . Post-operative wound abscess   . Respiratory failure with hypoxia ([place])   . Cervical spinal stenosis 12/05/2015  . AA (alcohol abuse) 10/11/2015  . Esophageal reflux   . Alcohol abuse   . DKA (diabetic ketoacidoses) ([place]) 08/09/2015  . Chest pain 08/09/2015  . Hyponatremia 08/09/2015  . Essential hypertension 08/09/2015  . Migraine with aura 08/09/2015  . Insomnia 08/09/2015  . Chronic pain 08/09/2015  . Diabetes mellitus ([place]) 08/09/2015  . Diabetic ketoacidosis without coma associated with type 2 diabetes mellitus ([place])   . Weakness of left lower extremity   . Cervical radiculopathy due to diabetes mellitus ([place]) 08/03/2015  . Left upper extremity numbness 08/03/2015  . Right upper extremity numbness 08/03/2015  . Uncontrolled type 2 diabetes mellitus with background retinopathy ([place]) 07/19/2015  . Degenerative arthritis of thumb 07/10/2015  . Chronic radicular cervical pain 06/08/2015  . Degeneration of lumbar/lumbosacral disc without myelopathy 06/08/2015  . Degeneration of intervertebral disc of lumbosacral region  06/08/2015  . Metabolic syndrome 40/98/1191  . Knee pain 07/12/2014  . Balanitis 07/12/2014  . History of cocaine use 07/12/2014  . Depression 07/12/2014  . Chronic pain of multiple joints 07/12/2014  . Hx-TIA (transient ischemic attack) 07/12/2014  . Hx of migraine headaches 07/12/2014  . Obesity, Class I, BMI 30.0-34.9 (see actual BMI) 07/12/2014  . Cocaine abuse 07/12/2014  . Major depressive disorder with single episode ([place]) 07/12/2014  . Adiposity 07/12/2014  . Ache in joint 07/12/2014  . H/O disease 07/12/2014  . Personal history of transient ischemic attack (TIA), and cerebral infarction without residual deficits 07/12/2014  . Chronic hepatitis C ([place]) 07/11/2014  . Hyperlipidemia LDL goal <100 07/11/2014  . Chronic hepatitis C virus infection ([place]) 07/11/2014  . HLD (hyperlipidemia) 07/11/2014  . Uncontrolled type 2 diabetes mellitus with peripheral circulatory disorder ([place]) 12/02/2013  . Hypertension, goal below 140/90 12/02/2013  . Essential (primary) hypertension 12/02/2013  . Type 2 diabetes mellitus with peripheral angiopathy ([place]) 12/02/2013    Past Medical History:  Past Medical History  Diagnosis Date  . Hypertension   . Diabetes mellitus without complication ([place])   . Chronic hepatitis C without hepatic coma ([place])   . History of cocaine use   . Depression   . Arthritis   . History of TIA (transient ischemic attack)   . Hyperlipidemia   . History of migraine headaches   . Obesity, Class II, BMI 35-39.9   . BPH with obstruction/lower urinary tract symptoms   . Balanitis   . Urinary retention   . Tobacco abuse    Past Surgical History:  Past Surgical History  Procedure Laterality Date  . Rotator  cuff surgery       left shoulder   . Spider bite      on neck  . Anterior cervical decomp/discectomy fusion N/A 12/05/2015    Procedure: Anterior Cervical Decompression Fusion Cervical three-four, Cervical four-five, Cervical five-six, Cervical six-seven;   Surgeon: Earnie Larsson, MD;  Location: [facility];  Service: Neurosurgery;  Laterality: N/A;  . Anterior cervical decompression for epidural abscess N/A 12/20/2015    Procedure: ANTERIOR CERVICAL DECOMPRESSION FOR EPIDURAL ABSCESS;  Surgeon: Leeroy Cha, MD;  Location: [facility];  Service: Neurosurgery;  Laterality: N/A;  ANTERIOR CERVICAL DECOMPRESSION FOR EPIDURAL ABSCESS    Assessment & Plan Clinical Impression: [name] is a 60 y.o. male with history of HTN, DMT2, Hep C, cervical stenosis with myelopathy s/p ACDF C3-C7 12/05/15 with post post op near complete loss of shoulder abduction and marked bilateral biceps weakness but improvement in overall neurologic deficits. He was admitted on 12/20/15 with neck swelling, inability to swallow, fever and erythema of neck/chest. CT neck done revealing large abscess 14 cm X 6 cm from skin surface to hardware with edema causing severe airway compromise. He was taken to OR for exploration of wound with drainage of abscess and irrigation of esophagus by Dr. Joya Salm on the same day. He remained intubated thorough 3/19 and respiratory status has been stable post extubation. ID consulted for input on antibiotic regimen and recommended continuing Vancomycin/Ceftriaxone till April 27th with twice weekly BMP and Vanc trough. Patient continues to have BUE weakness affecting ability to carry out ADL tasks. Pt failed swallow eval 3/21 and made NPO. Plan for Cortrak enteral feeding tube placement 3/23 and placement confirmed by xray prior to CIR admit. Plans for IV antibiotics per ID recommendations. Pt wants to delay PICC placement until further discussions with ID to follow.  Patient transferred to CIR on 12/27/2015 .   Patient currently requires supervision with mobility secondary to muscle weakness, unbalanced muscle activation and decreased coordination and decreased standing balance, decreased postural control and decreased balance strategies.  Prior to  hospitalization, patient was independent  with mobility and lived with Significant other in a [place] home.  Home access is  Level entry.  Patient will benefit from skilled PT intervention to maximize safe functional mobility, minimize fall risk and decrease caregiver burden for planned discharge home with 24 hour supervision.  Anticipate patient will benefit from follow up OP at discharge.  PT - End of Session Activity Tolerance: Tolerates 30+ min activity without fatigue Endurance Deficit: No PT Assessment Rehab Potential (ACUTE/IP ONLY): Good PT Patient demonstrates impairments in the following area(s): Balance;Safety;Motor PT Transfers Functional Problem(s): Bed Mobility;Car;Bed to Chair;Furniture PT Locomotion Functional Problem(s): Ambulation;Stairs PT Plan PT Intensity: Minimum of 1-2 x/day ,45 to 90 minutes PT Frequency: 5 out of 7 days PT Duration Estimated Length of Stay: 7-10 days PT Treatment/Interventions: Ambulation/gait training;Balance/vestibular training;Community reintegration;Discharge planning;DME/adaptive equipment instruction;Neuromuscular re-education;Functional mobility training;Patient/family education;Psychosocial support;Splinting/orthotics;Stair training;Therapeutic Exercise;Therapeutic Activities;UE/LE Strength taining/ROM;UE/LE Coordination activities PT Transfers Anticipated Outcome(s): mod I PT Locomotion Anticipated Outcome(s): mod I household, S community mobility and stairs PT Recommendation Follow Up Recommendations: Outpatient PT Patient destination: Home Equipment Recommended: To be determined  Skilled Therapeutic Intervention   PT Evaluation Precautions/Restrictions Precautions Precautions: Cervical Required Braces or Orthoses: Cervical Brace Cervical Brace: Soft collar;At all times Restrictions Weight Bearing Restrictions: No General Chart Reviewed: Yes Response to Previous Treatment: Patient with no complaints from previous  session. Family/Caregiver Present: No (  fiance present, however asleep through whole session) Pain Pain Assessment Pain Assessment: No/denies pain Home Living/Prior Functioning Home Living Available Help at Discharge: Family;Available 24 hours/day Type of Home: Apartment Home Access: Level entry Home Layout: One level Bathroom Shower/Tub: Chiropodist: Standard Bathroom Accessibility: Yes  Lives With: Significant other Prior Function Level of Independence: Independent with basic ADLs;Independent with homemaking with ambulation;Independent with gait;Independent with transfers  Able to Take Stairs?: Yes Driving: No (doesn't have license) Vocation: On disability Leisure: Hobbies-yes (Comment) Comments: watching basketball, working on things around the house, going out to the store Vision/Perception  Perception Comments: WFL  Cognition Overall Cognitive Status: Within Functional Limits for tasks assessed Arousal/Alertness: Awake/alert Orientation Level: Oriented X4 Attention: Selective Selective Attention: Appears intact Memory: Appears intact Awareness: Appears intact Safety/Judgment: Appears intact Sensation Sensation Light Touch: Appears Intact Stereognosis: Not tested Hot/Cold: Not tested Proprioception: Appears Intact Coordination Gross Motor Movements are Fluid and Coordinated: No Fine Motor Movements are Fluid and Coordinated: Yes Coordination and Movement Description: impaired UE coordination due to proximal strength deficits Finger Nose Finger Test: unable to assess seconary to impaired shoulder movement Heel Shin Test: [facility] Motor  Motor Motor:  (BUE paresis)  Mobility Bed Mobility Bed Mobility: Supine to Sit;Sit to Supine Supine to Sit: 5: Supervision Supine to Sit Details: Verbal cues for precautions/safety Sit to Supine: 5: Supervision Sit to Supine - Details: Verbal cues for precautions/safety Transfers Transfers: Yes Stand Pivot  Transfers: 5: Supervision Stand Pivot Transfer Details: Verbal cues for precautions/safety Locomotion  Ambulation Ambulation: Yes Ambulation/Gait Assistance: 5: Supervision Ambulation Distance (Feet): 300 Feet Assistive device: Rollator Ambulation/Gait Assistance Details: Verbal cues for precautions/safety;Verbal cues for safe use of DME/AE Gait Gait: Yes Gait Pattern: Impaired Gait Pattern: Lateral hip instability;Decreased stride length Gait velocity: decreased for age/gender norms High Level Ambulation High Level Ambulation: Head turns;Direction changes;Sudden stops Direction Changes: S, no LOB Sudden Stops: close S, no LOB Head Turns: S, no LOB Stairs / Additional Locomotion Stairs: Yes Stairs Assistance: 4: Min guard Stair Management Technique: Two rails;Alternating pattern;Forwards Number of Stairs: 12 Height of Stairs: 3 Wheelchair Mobility Wheelchair Mobility: No  Trunk/Postural Assessment  Cervical Assessment Cervical Assessment: Exceptions to [facility] (ROM limited due to cervical collar) Thoracic Assessment Thoracic Assessment: Within Functional Limits Lumbar Assessment Lumbar Assessment: Within Functional Limits Postural Control Postural Control: Within Functional Limits  Balance Balance Balance Assessed: Yes Standardized Balance Assessment Standardized Balance Assessment: Berg Balance Test;Timed Up and Go Test;Dynamic Gait Index Berg Balance Test Sit to Stand: Able to stand without using hands and stabilize independently Standing Unsupported: Able to stand safely 2 minutes Sitting with Back Unsupported but Feet Supported on Floor or Stool: Able to sit safely and securely 2 minutes Stand to Sit: Sits safely with minimal use of hands Transfers: Able to transfer safely, minor use of hands Standing Unsupported with Eyes Closed: Able to stand 10 seconds with supervision Standing Ubsupported with Feet Together: Able to place feet together independently and stand for 1  minute with supervision From Standing, Reach Forward with Outstretched Arm: Can reach confidently >25 cm (10") From Standing Position, Pick up Object from Floor: Able to pick up shoe safely and easily From Standing Position, Turn to Look Behind Over each Shoulder: Looks behind from both sides and weight shifts well Turn 360 Degrees: Able to turn 360 degrees safely one side only in 4 seconds or less Standing Unsupported, Alternately Place Feet on Step/Stool: Able to stand independently and safely and complete 8 steps in 20  seconds Standing Unsupported, One Foot in Front: Able to place foot tandem independently and hold 30 seconds Standing on One Leg: Able to lift leg independently and hold > 10 seconds Total Score: 53 Dynamic Gait Index Level Surface: Normal Change in Gait Speed: Normal Gait with Horizontal Head Turns: Normal Gait with Vertical Head Turns: Normal Gait and Pivot Turn: Normal Step Over Obstacle: Normal Step Around Obstacles: Normal Steps: Normal Total Score: 24 Timed Up and Go Test TUG: Normal TUG Normal TUG (seconds): 9.6 Static Sitting Balance Static Sitting - Balance Support: No upper extremity supported;Feet unsupported Static Sitting - Level of Assistance: 6: Modified independent (Device/Increase time) Dynamic Sitting Balance Dynamic Sitting - Balance Support: Feet supported;During functional activity;No upper extremity supported Dynamic Sitting - Level of Assistance: 6: Modified independent (Device/Increase time) Static Standing Balance Static Standing - Balance Support: No upper extremity supported Static Standing - Level of Assistance: 6: Modified independent (Device/Increase time) Static Stance: Eyes closed Static Stance: Eyes Closed: S x30 sec Static Stance: on Foam: S x30 sec Static Stance: on Foam, Eyes Closed: CGA x30 sec with increased anterior/posterior sway Dynamic Standing Balance Dynamic Standing - Balance Support: No upper extremity  supported;During functional activity Dynamic Standing - Level of Assistance: 5: Stand by assistance Dynamic Standing - Balance Activities: Lateral lean/weight shifting;Forward lean/weight shifting;Reaching across midline Extremity Assessment  RUE Assessment RUE Assessment: Exceptions to [facility] (uses shoulder elevation to compensate for no active shoulder flexion or abduction; elbow movement with gravity eliminated, wrist and finger WFL and intact gross grasp) LUE Assessment LUE Assessment: Exceptions to [facility] (uses shoulder elevation to compensate for no active shoulder flexion or abduction; elbow movement with gravity eliminated, wrist and finger WFL and intact gross grasp) RLE Assessment RLE Assessment: Within Functional Limits (grossly 4+/5 to 5/5 throughout) LLE Assessment LLE Assessment: Within Functional Limits (grossly 4+/5 to 5/5 throughout)   See Function Navigator for Current Functional Status.   Refer to Care Plan for Long Term Goals  Recommendations for other services: None  Discharge Criteria: Patient will be discharged from PT if patient refuses treatment 3 consecutive times without medical reason, if treatment goals not met, if there is a change in medical status, if patient makes no progress towards goals or if patient is discharged from hospital.  The above assessment, treatment plan, treatment alternatives and goals were discussed and mutually agreed upon: by patient  Luberta Mutter 12/28/2015, 4:02 PM

## 2015-12-28 NOTE — Progress Notes (Signed)
Occupational Therapy Session Note  Patient Details  Name: Jill SideWarren Kief MRN: [id number] Date of Birth: 03/23/1956  Today's Date: 12/28/2015 OT Individual Time: [id number] OT Individual Time Calculation (min): 59 min     Skilled Therapeutic Interventions/Progress Updates:   Pt ambulated to the gym with supervision for session.  Had him complete AAROM for each UE with shoulder flexion and elbow flexion in sitting.  Pt needing max assist with each movement secondary to only exhibiting trace movements at the biceps and shoulder flexors.  He completed 1 set of 5-10 reps for elbow flexion bilaterally and shoulder flexion before transitioning to supine.  Used dowel rod to work on small movements of shoulder flexion.  Helped pt achieve full elbow extension with shoulder flexion to 90 degrees.  He was able to maintain holding the dowel rod in place at this position.  Had him work on completing small movements of shoulder flexion and shoulder extension just away from his holding position.  Min to mod assist to return back to 90 degrees if moving shoulder flexion down to 70 degrees.  Pt completed several sets of 6-8 reps before needing rest break.  While in supine therapist attempted NMES to right deltoid.  Unable to get good active facilitation with changing to different larger pads or changing program.  Also attempted this with bicep on the LUE but could not achieve good activation either.  Finished exercises with 10 mins on the UE ergonometer.  He was able to tolerate 2 mins peddling forward with resistance at level 10 and RPMs greater than 25.  Next set was completed in reverse for 6 mins on level 1 increasing to level 10 for 2 additional mins.  HR increased to 138 with O2 at 98 %.  After brief rest, had pt ambulate back to the room where he completed toileting, washing his hands with mod assist before transferring to the bed.  Pt left with call button in reach as well as phone.  No alarm in place as safety plan  did not state this.    Therapy Documentation Precautions:  Precautions Precautions: Cervical Precaution Comments: Pt is wearing no brace in bed and applies sitting up in bed Required Braces or Orthoses: Cervical Brace Cervical Brace: Soft collar, At all times Restrictions Weight Bearing Restrictions: No  Vital Signs: Therapy Vitals Temp: 98.5 F (36.9 C) Temp Source: Oral Pulse Rate: (!) 112 Resp: 18 BP: (!) 135/91 mmHg Patient Position (if appropriate): Lying Oxygen Therapy SpO2: 98 % O2 Device: Not Delivered Pain: Pain Assessment Pain Assessment: No/denies pain ADL: See Function Navigator for Current Functional Status.   Therapy/Group: Individual Therapy  Tomasa Dobransky OTR/L 12/28/2015, 4:10 PM

## 2015-12-28 NOTE — Evaluation (Signed)
Speech Language Pathology Assessment and Plan  Patient Details  Name: William [name] MRN: [id number] Date of Birth: 11/22/1955  SLP Diagnosis: Dysphagia  Rehab Potential: Good ELOS: 7-10 days    Today's Date: 12/28/2015 SLP Individual Time: 1100-1200 SLP Individual Time Calculation (min): 60 min   Problem List:  Patient Active Problem List   Diagnosis Date Noted  . Central cord syndrome (HCC) 12/27/2015  . Uncontrolled type 2 diabetes mellitus with complication, with long-term current use of insulin (HCC)   . Chronic hepatitis C without hepatic coma (HCC)   . H/O cervical spine surgery   . Tachycardia   . Tachypnea   . Hypokalemia   . Encounter for nasogastric (NG) tube placement   . Neck abscess   . Brodie's abscess of cervical spine (HCC) 12/20/2015  . Abscess, neck   . Endotracheal tube present   . Post-operative wound abscess   . Respiratory failure with hypoxia (HCC)   . Cervical spinal stenosis 12/05/2015  . AA (alcohol abuse) 10/11/2015  . Esophageal reflux   . Alcohol abuse   . DKA (diabetic ketoacidoses) (HCC) 08/09/2015  . Chest pain 08/09/2015  . Hyponatremia 08/09/2015  . Essential hypertension 08/09/2015  . Migraine with aura 08/09/2015  . Insomnia 08/09/2015  . Chronic pain 08/09/2015  . Diabetes mellitus (HCC) 08/09/2015  . Diabetic ketoacidosis without coma associated with type 2 diabetes mellitus (HCC)   . Weakness of left lower extremity   . Cervical radiculopathy due to diabetes mellitus (HCC) 08/03/2015  . Left upper extremity numbness 08/03/2015  . Right upper extremity numbness 08/03/2015  . Uncontrolled type 2 diabetes mellitus with background retinopathy (HCC) 07/19/2015  . Degenerative arthritis of thumb 07/10/2015  . Chronic radicular cervical pain 06/08/2015  . Degeneration of lumbar/lumbosacral disc without myelopathy 06/08/2015  . Degeneration of intervertebral disc of lumbosacral region 06/08/2015  . Metabolic syndrome 05/31/2015  .  Knee pain 07/12/2014  . Balanitis 07/12/2014  . History of cocaine use 07/12/2014  . Depression 07/12/2014  . Chronic pain of multiple joints 07/12/2014  . Hx-TIA (transient ischemic attack) 07/12/2014  . Hx of migraine headaches 07/12/2014  . Obesity, Class I, BMI 30.0-34.9 (see actual BMI) 07/12/2014  . Cocaine abuse 07/12/2014  . Major depressive disorder with single episode (HCC) 07/12/2014  . Adiposity 07/12/2014  . Ache in joint 07/12/2014  . H/O disease 07/12/2014  . Personal history of transient ischemic attack (TIA), and cerebral infarction without residual deficits 07/12/2014  . Chronic hepatitis C (HCC) 07/11/2014  . Hyperlipidemia LDL goal <100 07/11/2014  . Chronic hepatitis C virus infection (HCC) 07/11/2014  . HLD (hyperlipidemia) 07/11/2014  . Uncontrolled type 2 diabetes mellitus with peripheral circulatory disorder (HCC) 12/02/2013  . Hypertension, goal below 140/90 12/02/2013  . Essential (primary) hypertension 12/02/2013  . Type 2 diabetes mellitus with peripheral angiopathy (HCC) 12/02/2013   Past Medical History:  Past Medical History  Diagnosis Date  . Hypertension   . Diabetes mellitus without complication (HCC)   . Chronic hepatitis C without hepatic coma (HCC)   . History of cocaine use   . Depression   . Arthritis   . History of TIA (transient ischemic attack)   . Hyperlipidemia   . History of migraine headaches   . Obesity, Class II, BMI 35-39.9   . BPH with obstruction/lower urinary tract symptoms   . Balanitis   . Urinary retention   . Tobacco abuse    Past Surgical History:  Past Surgical History  Procedure Laterality Date  .   Rotator  cuff surgery       left shoulder   . Spider bite      on neck  . Anterior cervical decomp/discectomy fusion N/A 12/05/2015    Procedure: Anterior Cervical Decompression Fusion Cervical three-four, Cervical four-five, Cervical five-six, Cervical six-seven;  Surgeon: Henry Pool, MD;  Location: [facility];   Service: Neurosurgery;  Laterality: N/A;  . Anterior cervical decompression for epidural abscess N/A 12/20/2015    Procedure: ANTERIOR CERVICAL DECOMPRESSION FOR EPIDURAL ABSCESS;  Surgeon: Ernesto Botero, MD;  Location: [facility];  Service: Neurosurgery;  Laterality: N/A;  ANTERIOR CERVICAL DECOMPRESSION FOR EPIDURAL ABSCESS    Assessment / Plan / Recommendation Clinical Impression   William [name] is a 59 y.o. male with history of HTN, DMT2, Hep C, cervical stenosis with myelopathy s/p ACDF C3-C7 12/05/15 with post post op near complete loss of shoulder abduction and marked bilateral biceps weakness but improvement in overall neurologic deficits. He was admitted on 12/20/15 with neck swelling, inability to swallow, fever and erythema of neck/chest. CT neck done revealing large abscess 14 cm X 6 cm from skin surface to hardware with edema causing severe airway compromise. He was taken to OR for exploration of wound with drainage of abscess and irrigation of esophagus by Dr. Botero on the same day. He remained intubated thorough 3/19 and respiratory status has been stable post extubation. ID was consulted for input on antibiotic regimen and recommended continuing Vancomycin/Ceftriaxone till April 27th with twice weekly BMP and Vanc trough. He has had problems managing secretions and MBS done revealing posterior pharyngeal edema leading to poor laryngeal closure withsignificant aspiration with thin and thick liquids and NPO recommended. . NGT placed. Patient continues to have BUE weakness affecting ability to carry out ADL tasks.  Upon swallow eval this date pt continues with significant dysphagia, however per subject pt report, neck fullness is decreasing and swallow function is returning. Pt presents with severe pharyngeal stage dysphagia complicated by posterior pharyngeal wall edema. Pt would benefit from ongoing swallow therapy to maintain current strength and assess for readiness for repeat MBSS.    Skilled Therapeutic Interventions          Oral care completed with min A. Pt initially tolerated ice chips without overt s/s aspiration. 1/4 t. Amount thin water trialed x 2 without overt s/s aspiration, however multiple swallows present. With progression of the session, pt began to demonstrate coughing with ice chip trials. Discontinued ice chips and discussed rationale for therapy. Focus on education to decrease risk of aspiration PNA and maintain muscle strength. Pt verbalized good understanding of current limitations.     SLP Assessment  Patient will need skilled Speech Lanaguage Pathology Services during CIR admission    Recommendations  SLP Diet Recommendations: NPO;Alternative means - temporary Medication Administration: Via alternative means Oral Care Recommendations: Oral care BID Patient destination: Home Follow up Recommendations:  (pending progress) Equipment Recommended: To be determined    SLP Frequency 3 to 5 out of 7 days   SLP Duration  SLP Intensity  SLP Treatment/Interventions 7-10 days  Minumum of 1-2 x/day, 30 to 90 minutes  Dysphagia/aspiration precaution training;Patient/family education;Therapeutic Activities    Pain Pain Assessment Pain Assessment: No/denies pain  Prior Functioning Cognitive/Linguistic Baseline: Within functional limits Type of Home: Apartment  Lives With: Significant other Available Help at Discharge: Family;Available 24 hours/day Vocation: On disability  Function:  Eating Eating Eating activity did not occur: Safety/medical concerns Modified Consistency Diet:  (NPO) Eating Assist Level: Helper   performs IV, parenteral or tube feed           Cognition Comprehension Comprehension assist level: Follows complex conversation/direction with extra time/assistive device  Expression   Expression assist level: Expresses complex ideas: With extra time/assistive device  Social Interaction Social Interaction assist level: Interacts  appropriately 90% of the time - Needs monitoring or encouragement for participation or interaction.  Problem Solving Problem solving assist level: Solves basic problems with no assist  Memory Memory assist level: Recognizes or recalls 90% of the time/requires cueing < 10% of the time   Short Term Goals: Week 1: SLP Short Term Goal 1 (Week 1): Pt to tolerate trials of ice chips without overt s/s aspiration. SLP Short Term Goal 2 (Week 1): Pt to demonstrate awareness of dysphagia/fatigue for assessing readiness for ice chip protocol. SLP Short Term Goal 3 (Week 1): Pt to verbalize swallowing limitations and precautions at mod I.  Refer to Care Plan for Long Term Goals  Recommendations for other services: None  Discharge Criteria: Patient will be discharged from SLP if patient refuses treatment 3 consecutive times without medical reason, if treatment goals not met, if there is a change in medical status, if patient makes no progress towards goals or if patient is discharged from hospital.  The above assessment, treatment plan, treatment alternatives and goals were discussed and mutually agreed upon: by patient  Vinetta Bergamo 12/28/2015, 3:56 PM

## 2015-12-28 NOTE — Progress Notes (Signed)
Initial Nutrition Assessment  DOCUMENTATION CODES:   Obesity unspecified  INTERVENTION:  Discontinue Jevity 1.2 formula.  Initiate Glucerna 1.2 formula via Cortrak NGT at 40 ml/hr and increase by 10 ml every 4 hours to goal rate of 80 ml/hr x 20 hours (may hold TF for up to 4 hours for therapies) with 30 ml Prostat once daily to provide 2020 kcal (100% of needs), 111 grams of protein, and 1296 ml of free water.   Continue free water flushes of 200 ml QID per tube. Total free water: 2096 ml  RD to continue to monitor.   NUTRITION DIAGNOSIS:   Inadequate oral intake related to inability to eat as evidenced by NPO status.  GOAL:   Patient will meet greater than or equal to 90% of their needs  MONITOR:   TF tolerance, Skin, Weight trends, Labs, I & O's  REASON FOR ASSESSMENT:   Consult Enteral/tube feeding initiation and management  ASSESSMENT:   60 y.o. male with history of HTN, DMT2, Hep C, cervical stenosis with myelopathy s/p ACDF C3-C7 12/05/15 with post post op near complete loss of shoulder abduction and marked bilateral biceps weakness but improvement in overall neurologic deficits. He was admitted on 12/20/15 with neck swelling, inability to swallow, fever and erythema of neck/chest.  He was taken to OR for exploration of wound with drainage of abscess and irrigation of esophagus MBS done revealing posterior pharyngeal edema leading to poor laryngeal closure with Penetration/aspiration and NPO recommended. He has been reporting fullness in his throat for past 24 hours but no SOB. NGT placed  Pt has Jevity 1.2 ordered currently advancing to goal rate of 75 ml/hr x 20 hours which will provide 1800 kcal (90% of kcal needs), 83 grams of protein (75% of protein needs), and 1215 ml of free water. Noted CBG's have been elevated (170-366 mg/dL). RD to switch tube feeding formula to Glucerna 1.2 and adjust rate to better meet needs. Per Epic weight records, pt with no significant  weight loss. Unable to complete Nutrition-Focused physical exam at this time as pt was unavailable during attempted time of visit. RD to continue to monitor.   Labs and medications reviewed.   Diet Order:  Diet NPO time specified Except for: Ice Chips  Skin:   (Incision on neck)  Last BM:  3/22  Height:   Ht Readings from Last 1 Encounters:  12/20/15 5\' 10"  (1.778 m)    Weight:   Wt Readings from Last 1 Encounters:  12/24/15 232 lb 9.4 oz (105.5 kg)    Ideal Body Weight:  75.45 kg  BMI:  There is no weight on file to calculate BMI.  Estimated Nutritional Needs:   Kcal:  2000-2200  Protein:  110-120 grams  Fluid:  2- 2.2 L/day  EDUCATION NEEDS:   No education needs identified at this time  Roslyn SmilingStephanie Floella Ensz, MS, RD, LDN Pager # [phone] After hours/ weekend pager # [phone]

## 2015-12-28 NOTE — Evaluation (Signed)
Occupational Therapy Assessment and Plan  Patient Details  Name: William [name] MRN: [id number] Date of Birth: 06-Feb-1956  OT Diagnosis: muscle weakness (generalized) and paraparesis at level C3-C7 Rehab Potential: Rehab Potential (ACUTE ONLY): Good ELOS: 7-10 days   Today's Date: 12/28/2015 OT Individual Time: 7847-8412 OT Individual Time Calculation (min): 60 min     Problem List:  Patient Active Problem List   Diagnosis Date Noted  . Central cord syndrome ([place]) 12/27/2015  . Uncontrolled type 2 diabetes mellitus with complication, with long-term current use of insulin ([place])   . Chronic hepatitis C without hepatic coma ([place])   . H/O cervical spine surgery   . Tachycardia   . Tachypnea   . Hypokalemia   . Encounter for nasogastric (NG) tube placement   . Neck abscess   . Brodie's abscess of cervical spine ([place]) 12/20/2015  . Abscess, neck   . Endotracheal tube present   . Post-operative wound abscess   . Respiratory failure with hypoxia ([place])   . Cervical spinal stenosis 12/05/2015  . AA (alcohol abuse) 10/11/2015  . Esophageal reflux   . Alcohol abuse   . DKA (diabetic ketoacidoses) ([place]) 08/09/2015  . Chest pain 08/09/2015  . Hyponatremia 08/09/2015  . Essential hypertension 08/09/2015  . Migraine with aura 08/09/2015  . Insomnia 08/09/2015  . Chronic pain 08/09/2015  . Diabetes mellitus ([place]) 08/09/2015  . Diabetic ketoacidosis without coma associated with type 2 diabetes mellitus ([place])   . Weakness of left lower extremity   . Cervical radiculopathy due to diabetes mellitus ([place]) 08/03/2015  . Left upper extremity numbness 08/03/2015  . Right upper extremity numbness 08/03/2015  . Uncontrolled type 2 diabetes mellitus with background retinopathy ([place]) 07/19/2015  . Degenerative arthritis of thumb 07/10/2015  . Chronic radicular cervical pain 06/08/2015  . Degeneration of lumbar/lumbosacral disc without myelopathy 06/08/2015  . Degeneration of intervertebral disc of  lumbosacral region 06/08/2015  . Metabolic syndrome 82/05/1387  . Knee pain 07/12/2014  . Balanitis 07/12/2014  . History of cocaine use 07/12/2014  . Depression 07/12/2014  . Chronic pain of multiple joints 07/12/2014  . Hx-TIA (transient ischemic attack) 07/12/2014  . Hx of migraine headaches 07/12/2014  . Obesity, Class I, BMI 30.0-34.9 (see actual BMI) 07/12/2014  . Cocaine abuse 07/12/2014  . Major depressive disorder with single episode ([place]) 07/12/2014  . Adiposity 07/12/2014  . Ache in joint 07/12/2014  . H/O disease 07/12/2014  . Personal history of transient ischemic attack (TIA), and cerebral infarction without residual deficits 07/12/2014  . Chronic hepatitis C ([place]) 07/11/2014  . Hyperlipidemia LDL goal <100 07/11/2014  . Chronic hepatitis C virus infection ([place]) 07/11/2014  . HLD (hyperlipidemia) 07/11/2014  . Uncontrolled type 2 diabetes mellitus with peripheral circulatory disorder ([place]) 12/02/2013  . Hypertension, goal below 140/90 12/02/2013  . Essential (primary) hypertension 12/02/2013  . Type 2 diabetes mellitus with peripheral angiopathy ([place]) 12/02/2013    Past Medical History:  Past Medical History  Diagnosis Date  . Hypertension   . Diabetes mellitus without complication ([place])   . Chronic hepatitis C without hepatic coma ([place])   . History of cocaine use   . Depression   . Arthritis   . History of TIA (transient ischemic attack)   . Hyperlipidemia   . History of migraine headaches   . Obesity, Class II, BMI 35-39.9   . BPH with obstruction/lower urinary tract symptoms   . Balanitis   . Urinary retention   . Tobacco abuse  Past Surgical History:  Past Surgical History  Procedure Laterality Date  . Rotator  cuff surgery       left shoulder   . Spider bite      on neck  . Anterior cervical decomp/discectomy fusion N/A 12/05/2015    Procedure: Anterior Cervical Decompression Fusion Cervical three-four, Cervical four-five, Cervical five-six,  Cervical six-seven;  Surgeon: Earnie Larsson, MD;  Location: [facility];  Service: Neurosurgery;  Laterality: N/A;  . Anterior cervical decompression for epidural abscess N/A 12/20/2015    Procedure: ANTERIOR CERVICAL DECOMPRESSION FOR EPIDURAL ABSCESS;  Surgeon: Leeroy Cha, MD;  Location: [place] [facility];  Service: Neurosurgery;  Laterality: N/A;  ANTERIOR CERVICAL DECOMPRESSION FOR EPIDURAL ABSCESS    Assessment & Plan Clinical Impression: Patient is a 60 y.o. male with history of HTN, DMT2, Hep C, cervical stenosis with myelopathy s/p ACDF C3-C7 12/05/15 with post post op near complete loss of shoulder abduction and marked bilateral biceps weakness but improvement in overall neurologic deficits. He was admitted on 12/20/15 with neck swelling, inability to swallow, fever and erythema of neck/chest. CT neck done revealing large abscess 14 cm X 6 cm from skin surface to hardware with edema causing severe airway compromise. He was taken to OR for exploration of wound with drainage of abscess and irrigation of esophagus by Dr. Joya Salm on the same day. He remained intubated thorough 3/19 and respiratory status has been stable post extubation. ID was consulted for input on antibiotic regimen and recommended continuing Vancomycin/Ceftriaxone till April 27th with twice weekly BMP and Vanc trough. Patient refusing PICC and IV antibiotics after discharge and concerns regarding relapse discussed by Dr. Linus Salmons. He has had problems managing secretions and MBS done revealing posterior pharyngeal edema leading to poor laryngeal closure with Penetration/aspiration and NPO recommended. He has been reporting fullness in his throat for past 24 hours but no SOB. NGT placed. Patient continues to have BUE weakness affecting ability to carry out ADL tasks. CIR recommended for follow up therapy.   Patient transferred to CIR on 12/27/2015 .    Patient currently requires max with basic self-care skills secondary to muscle paralysis  and abnormal tone and unbalanced muscle activation.  Prior to hospitalization, patient could complete ADLs with independent .  Patient will benefit from skilled intervention to decrease level of assist with basic self-care skills and increase independence with basic self-care skills prior to discharge home with care partner.  Anticipate patient will require intermittent supervision and follow up outpatient.  OT - End of Session Activity Tolerance: Tolerates 30+ min activity without fatigue Endurance Deficit: No OT Assessment Rehab Potential (ACUTE ONLY): Good OT Patient demonstrates impairments in the following area(s): Balance;Motor OT Basic ADL's Functional Problem(s): Eating;Grooming;Bathing;Dressing;Toileting OT Transfers Functional Problem(s): Toilet;Tub/Shower OT Additional Impairment(s): Fuctional Use of Upper Extremity OT Plan OT Intensity: Minimum of 1-2 x/day, 45 to 90 minutes OT Frequency: 5 out of 7 days OT Duration/Estimated Length of Stay: 7-10 days OT Treatment/Interventions: Balance/vestibular training;Discharge planning;Disease Lawyer;Functional electrical stimulation;Functional mobility training;Neuromuscular re-education;Pain management;Patient/family education;Psychosocial support;Self Care/advanced ADL retraining;Splinting/orthotics;Therapeutic Activities;Therapeutic Exercise;UE/LE Strength taining/ROM;UE/LE Coordination activities OT Self Feeding Anticipated Outcome(s): Supervision/setup OT Basic Self-Care Anticipated Outcome(s): Supervision/setup OT Toileting Anticipated Outcome(s): Supervision/setup OT Bathroom Transfers Anticipated Outcome(s): Supervision/setup OT Recommendation Patient destination: Home Follow Up Recommendations: Outpatient OT Equipment Recommended: To be determined   Skilled Therapeutic Intervention OT eval completed with discussion of rehab process, OT purpose, POC, goals, and ELOS.  ADL  assessment completed with focus on functional use of BUE and  ROM during bathing and dressing tasks.  Toilet transfers with min assist for stability and assist for hygiene and to pull up pants due to decreased BUE ROM and strength.  Pt utilized sink to assist with bathing and oral hygiene due to impaired shoulder flexion and bicep activation.  Pt able to participate in LB dressing, but required increased assist with UB dressing.  OT Evaluation Precautions/Restrictions  Precautions Precautions: Cervical Precaution Comments: Pt is wearing no brace in bed and applies sitting up in bed Required Braces or Orthoses: Cervical Brace Cervical Brace: Soft collar;At all times General   Vital Signs Therapy Vitals Temp: 98.5 F (36.9 C) Temp Source: Oral Pulse Rate: (!) 112 Resp: 18 BP: (!) 135/91 mmHg Patient Position (if appropriate): Lying Oxygen Therapy SpO2: 98 % O2 Device: Not Delivered Pain Pain Assessment Pain Assessment: No/denies pain Home Living/Prior Functioning Home Living Available Help at Discharge: Family, Available 24 hours/day Type of Home: Apartment Home Access: Level entry Home Layout: One level Bathroom Shower/Tub: Government social research officer Accessibility: Yes  Lives With: Significant other Prior Function Level of Independence: Independent with basic ADLs, Independent with homemaking with ambulation, Independent with gait Driving: No (doesn't have license) Vocation: On disability Leisure: Hobbies-yes (Comment) Comments: enjoys sports ADL  See Function Navigator Vision/Perception  Vision- History Baseline Vision/History: Wears glasses Wears Glasses: At all times Patient Visual Report: Blurring of vision Vision- Assessment Vision Assessment?: No apparent visual deficits  Cognition Overall Cognitive Status: Within Functional Limits for tasks assessed Arousal/Alertness: Awake/alert Orientation Level: Person;Place;Situation Person:  Oriented Place: Oriented Situation: Oriented Year: 2017 Month: March Day of Week: Correct Memory: Appears intact Immediate Memory Recall: Sock;Blue;Bed Memory Recall: Sock;Blue Memory Recall Sock: Without Cue Memory Recall Blue: Without Cue Attention: Selective Selective Attention: Appears intact Awareness: Appears intact Safety/Judgment: Appears intact Sensation Sensation Light Touch: Appears Intact Stereognosis: Not tested Hot/Cold: Not tested Proprioception: Appears Intact Coordination Gross Motor Movements are Fluid and Coordinated: No Fine Motor Movements are Fluid and Coordinated: Yes Coordination and Movement Description: Pt with limited BUE ROM, utilizes shoulder elevation to increase participation in activities but limited by decreased shoulder range Finger Nose Finger Test: unable to assess seconary to impaired shoulder movement Motor  Motor Motor:  (BUE paresis) Mobility  Bed Mobility Bed Mobility: Supine to Sit;Sit to Supine Supine to Sit: 5: Supervision Supine to Sit Details: Verbal cues for precautions/safety Sit to Supine: 5: Supervision Sit to Supine - Details: Verbal cues for precautions/safety  Trunk/Postural Assessment  Cervical Assessment Cervical Assessment: Exceptions to [facility] (ROM limited due to cervical collar) Thoracic Assessment Thoracic Assessment: Within Functional Limits Lumbar Assessment Lumbar Assessment: Within Functional Limits Postural Control Postural Control: Within Functional Limits  Balance   Extremity/Trunk Assessment RUE Assessment RUE Assessment: Exceptions to [facility] (uses shoulder elevation to compensate for no active shoulder flexion or abduction; elbow movement with gravity eliminated, wrist and finger WFL and intact gross grasp) LUE Assessment LUE Assessment: Exceptions to [facility] (uses shoulder elevation to compensate for no active shoulder flexion or abduction; elbow movement with gravity eliminated, wrist and finger WFL and  intact gross grasp)   See Function Navigator for Current Functional Status.   Refer to Care Plan for Long Term Goals  Recommendations for other services: None  Discharge Criteria: Patient will be discharged from OT if patient refuses treatment 3 consecutive times without medical reason, if treatment goals not met, if there is a change in medical status, if patient makes no progress towards goals or if patient is  discharged from hospital.  The above assessment, treatment plan, treatment alternatives and goals were discussed and mutually agreed upon: by patient  Simonne Come 12/28/2015, 3:35 PM

## 2015-12-28 NOTE — Progress Notes (Signed)
Patient information reviewed and entered into [facility] system by Jimeka Balan, RN, CRRN, PPS Coordinator.  Information including medical coding and functional independence measure will be reviewed and updated through discharge.    

## 2015-12-28 NOTE — Progress Notes (Signed)
Pharmacy Antibiotic Note  William [name] is a 60 y.o. male admitted on 12/27/2015 with cervical abscess. Pt on Day #8 of antibiotics (Vanc and Rocephin). SCr 1.01 (now trending down with baseline ~0.6).   Vancomycin trough 14 mcg/ml (slightly subtherapeutic for goal trough of 15-20)  Plan: - Increase Vancomycin back to 1000 mg IV Q 8 hrs - Monitor renal function closely - Recheck vancomycin trough at Css on new dose.     Temp (24hrs), Avg:98.7 F (37.1 C), Min:98.5 F (36.9 C), Max:98.8 F (37.1 C)   Recent Labs Lab 12/22/15 0233 12/23/15 0235 12/24/15 0312  12/25/15 0524 12/27/15 1702 12/27/15 1950 12/27/15 1955 12/28/15 0824 12/28/15 2137  WBC 6.1 6.6  --   --   --   --   --   --  6.1  --   CREATININE 0.84 0.70 0.63  --  0.67 1.07 1.09  --  1.01  --   VANCOTROUGH  --   --   --   < >  --  20  --  14  --  14  < > = values in this interval not displayed.  Estimated Creatinine Clearance: 94.6 mL/min (by C-G formula based on Cr of 1.01).    Allergies  Allergen Reactions  . Metformin And Related Other (See Comments)    Possible liver inflammation  . Prednisone Other (See Comments)    Disoriented   . Tamsulosin Other (See Comments)    Nose bleed  . Other Other (See Comments)    Reports all narcotics make him sick. Pt premeds with Reglan to stop nausea    Antimicrobials this admission: Vancomycin 3/16>> Zosyn 3/16>>3/20 CTX 3/20>>  Dose adjustments this admission: 3/20 VT = 7 on 1g Q 12 - > increased to 1g Q 8  3/23 VT = 20, drawn 1 hr early, but scr up 0.67 > 1.07, concerning for accumulation. -- > decrease to 750 Q8  3/23 VR was 14 3/24 VT = 14 on 750mg  IV q8h ->back to 1gm IV q8h  Microbiology results: 3/16 Cervical wound: neg, no anaerobes   Thank you for allowing pharmacy to be a part of this patient's care.  Christoper Fabianaron Vidyuth Belsito, PharmD, BCPS Clinical pharmacist, pager [phone] 12/28/2015 11:33 PM

## 2015-12-28 NOTE — Progress Notes (Signed)
Peripherally Inserted Central Catheter/Midline Placement  The IV Nurse has discussed with the patient and/or persons authorized to consent for the patient, the purpose of this procedure and the potential benefits and risks involved with this procedure.  The benefits include less needle sticks, lab draws from the catheter and patient may be discharged home with the catheter.  Risks include, but not limited to, infection, bleeding, blood clot (thrombus formation), and puncture of an artery; nerve damage and irregular heat beat.  Alternatives to this procedure were also discussed.  PICC/Midline Placement Documentation  PICC Single Lumen 12/28/15 PICC Right Basilic 43 cm 0 cm (Active)  Indication for Insertion or Continuance of Line Home intravenous therapies (PICC only) 12/28/2015  1:39 PM  Exposed Catheter (cm) 1 cm 12/28/2015  1:39 PM  Site Assessment Clean;Dry;Intact 12/28/2015  1:39 PM  Line Status Flushed;Saline locked;Blood return noted 12/28/2015  1:39 PM  Dressing Type Transparent 12/28/2015  1:39 PM  Dressing Status Clean;Dry;Intact 12/28/2015  1:39 PM  Dressing Change Due 01/04/16 12/28/2015  1:39 PM       Ethelda Chickurrie, Jesiel Garate Robert 12/28/2015, 1:40 PM

## 2015-12-28 NOTE — IPOC Note (Signed)
Overall Plan of Care [facility](IPOC) Patient Details Name: William [name] MRN: [id number] DOB: 01/15/1956  Admitting Diagnosis: Central Cord  Hospital Problems: Active Problems:   Central cord syndrome [facility](HCC)     Functional Problem List: Nursing Edema, Endurance, Medication Management, Motor, Nutrition, Pain, Perception, Safety, Sensory  PT Balance, Safety, Motor  OT Balance, Motor  SLP Nutrition  TR         Basic ADL's: OT Eating, Grooming, Bathing, Dressing, Toileting     Advanced  ADL's: OT       Transfers: PT Bed Mobility, Car, Bed to Chair, Occupational psychologisturniture  OT Toilet, Research scientist (life sciences)Tub/Shower     Locomotion: PT Ambulation, Stairs     Additional Impairments: OT Fuctional Use of Upper Extremity  SLP Swallowing      TR      Anticipated Outcomes Item Anticipated Outcome  Self Feeding Supervision/setup  Swallowing  mod I with the least restrictive diet (pending swallow function after pharyngeal edema resolved)   Basic self-care  Supervision/setup  Toileting  Supervision/setup   Bathroom Transfers Supervision/setup  Bowel/Bladder  patient will remain cont x2  Transfers  mod I  Locomotion  mod I household, S community mobility and stairs  Communication     Cognition     Pain  pain < 3 on 0-10 pain scale.   Safety/Judgment  Patient remain free from falls on Rehab.    Therapy Plan: PT Intensity: Minimum of 1-2 x/day ,45 to 90 minutes PT Frequency: 5 out of 7 days PT Duration Estimated Length of Stay: 7-10 days OT Intensity: Minimum of 1-2 x/day, 45 to 90 minutes OT Frequency: 5 out of 7 days OT Duration/Estimated Length of Stay: 7-10 days SLP Intensity: Minumum of 1-2 x/day, 30 to 90 minutes SLP Frequency: 3 to 5 out of 7 days SLP Duration/Estimated Length of Stay: 7-10 days       Team Interventions: Nursing Interventions Patient/Family Education, Pain Management, Medication Management, Cognitive Remediation/Compensation, Dysphagia/Aspiration Printmakerrecaution Training, Discharge  Planning  PT interventions Ambulation/gait training, Warden/rangerBalance/vestibular training, Community reintegration, Discharge planning, DME/adaptive equipment instruction, Neuromuscular re-education, Functional mobility training, Patient/family education, Psychosocial support, Splinting/orthotics, Stair training, Therapeutic Exercise, Therapeutic Activities, UE/LE Strength taining/ROM, UE/LE Coordination activities  OT Interventions Warden/rangerBalance/vestibular training, Discharge planning, Disease mangement/prevention, DME/adaptive equipment instruction, Functional electrical stimulation, Functional mobility training, Neuromuscular re-education, Pain management, Patient/family education, Psychosocial support, Self Care/advanced ADL retraining, Splinting/orthotics, Therapeutic Activities, Therapeutic Exercise, UE/LE Strength taining/ROM, UE/LE Coordination activities  SLP Interventions Dysphagia/aspiration precaution training, Patient/family education, Therapeutic Activities  TR Interventions    SW/CM Interventions Discharge Planning, Psychosocial Support, Patient/Family Education    Team Discharge Planning: Destination: PT-Home ,OT- Home , SLP-Home Projected Follow-up: PT-Outpatient PT, OT-  Outpatient OT, SLP- (pending progress) Projected Equipment Needs: PT-To be determined, OT- To be determined, SLP-To be determined Equipment Details: PT- , OT-  Patient/family involved in discharge planning: PT- Patient,  OT-Patient, SLP-Patient  MD ELOS: 7-10 days Medical Rehab Prognosis:  Excellent Assessment: The patient has been admitted for CIR therapies with the diagnosis of central cord syndrome/radiculopathy. The team will be addressing functional mobility, strength, stamina, balance, safety, adaptive techniques and equipment, self-care, bowel and bladder mgt, patient and caregiver education, NMR, community reintegration, ego support, pain control, swallowing/communication. Goals have been set at mod I for mobility and  self-care and mod I to supervision for swallowing.    William OysterZachary T. Adolphus Hanf, MD, FAAPMR      See Team Conference Notes for weekly updates to the plan of care

## 2015-12-28 NOTE — Progress Notes (Signed)
Patient ID: William [name], male   DOB: [birth year], 60 y.o.   MRN: [id number] Stable, no new complaints.

## 2015-12-29 ENCOUNTER — Inpatient Hospital Stay (HOSPITAL_COMMUNITY): Payer: Medicaid Other | Admitting: Speech Pathology

## 2015-12-29 ENCOUNTER — Inpatient Hospital Stay (HOSPITAL_COMMUNITY): Payer: Medicaid Other | Admitting: Physical Therapy

## 2015-12-29 ENCOUNTER — Inpatient Hospital Stay (HOSPITAL_COMMUNITY): Payer: Medicaid Other | Admitting: Occupational Therapy

## 2015-12-29 DIAGNOSIS — E440 Moderate protein-calorie malnutrition: Secondary | ICD-10-CM

## 2015-12-29 DIAGNOSIS — S14129S Central cord syndrome at unspecified level of cervical spinal cord, sequela: Secondary | ICD-10-CM

## 2015-12-29 LAB — GLUCOSE, CAPILLARY
GLUCOSE-CAPILLARY: 230 mg/dL — AB (ref 65–99)
GLUCOSE-CAPILLARY: 296 mg/dL — AB (ref 65–99)
GLUCOSE-CAPILLARY: 321 mg/dL — AB (ref 65–99)
Glucose-Capillary: 194 mg/dL — ABNORMAL HIGH (ref 65–99)
Glucose-Capillary: 249 mg/dL — ABNORMAL HIGH (ref 65–99)
Glucose-Capillary: 268 mg/dL — ABNORMAL HIGH (ref 65–99)
Glucose-Capillary: 326 mg/dL — ABNORMAL HIGH (ref 65–99)

## 2015-12-29 NOTE — Progress Notes (Signed)
Physical Therapy Session Note  Patient Details  Name: William [name] MRN: [id number] Date of Birth: June 19, 1956  Today's Date: 12/29/2015 PT Individual Time: 0905-1002 PT Individual Time Calculation (min): 57 min   Short Term Goals: Week 1:  PT Short Term Goal 1 (Week 1): =LTG due to estimated LOS  Skilled Therapeutic Interventions/Progress Updates:    Pt received resting in bed, RN present finishing delivering meds, pt no c/o pain and agreeable to therapy.  Session focus on dynamic balance, functional use of UEs, coordination, and gait with bare feet.  Pt requesting to wash up at sink level.  Repeated sit<>stands for LB clothing management with supervision for transfer and clothing management.  Pt required supervision for bathing of limbs, abdomen, and chest and total assist for bathing buttocks and back.  Pt able to perform LB dressing supervision with sit<>stand to pull pants up.  Pt able to thread UEs through shirt with supervision and PT assisted pulling shirt over head/trunk.  Oral care with set up to obtain supplies and open toothpaste.  Pt amb to/from bathroom x2 with bare feet and close supervision for safety.  Pt returned to recliner at end of session and positioned with non-skid socks donned, call bell in reach, and needs met.   Therapy Documentation Precautions:  Precautions Precautions: Cervical Precaution Comments: Pt is wearing no brace in bed and applies sitting up in bed Required Braces or Orthoses: Cervical Brace Cervical Brace: Soft collar, At all times Restrictions Weight Bearing Restrictions: No   See Function Navigator for Current Functional Status.   Therapy/Group: Individual Therapy  Maragret Vanacker E Penven-Crew 12/29/2015, 9:59 AM

## 2015-12-29 NOTE — Progress Notes (Signed)
Occupational Therapy Session Note  Patient Details  Name: William [name] MRN: [id number] Date of Birth: 1955/12/17  Today's Date: 12/29/2015 OT Individual Time: 1050-1210 OT Individual Time Calculation (min): 80 min    Skilled Therapeutic Interventions/Progress Updates: patient receiving nursing care upon approach for OT and then participated in skilled OT to increase UE ROM and function, along with endurance.  Patient was able to maintain good standing balance for at least 7 minutes per try to complete active assisted ROM exercises and PNF diagonals 1 & 2 .    He sat for triceps, biceps and middle deltoid strengthening.      Patient was assisted  To a high back regular chair next to his bed and with call bell within reach at the end of his session.     Therapy Documentation Precautions:  Precautions Precautions: Cervical Precaution Comments: Pt is wearing no brace in bed and applies sitting up in bed Required Braces or Orthoses: Cervical Brace Cervical Brace: Soft collar, At all times Restrictions Weight Bearing Restrictions: No  Pain: Pain Assessment Pain Assessment: No/denies pain  See Function Navigator for Current Functional Status.   Therapy/Group: Individual Therapy  Bud Faceickett, Andric Kerce [facility] 12/29/2015, 6:10 PM

## 2015-12-29 NOTE — Progress Notes (Signed)
Patient ID: William [name], male   DOB: May 30, 1956, 60 y.o.   MRN: [id number] Doing well tolerating tube feedings.

## 2015-12-29 NOTE — Progress Notes (Signed)
Speech Language Pathology Daily Session Note  Patient Details  Name: William [name] MRN: [id number] Date of Birth: 16-Nov-1955  Today's Date: 12/29/2015 SLP Individual Time: 1015-1100 SLP Individual Time Calculation (min): 45 min  Short Term Goals: Week 1: SLP Short Term Goal 1 (Week 1): Pt to tolerate trials of ice chips without overt s/s aspiration. SLP Short Term Goal 2 (Week 1): Pt to demonstrate awareness of dysphagia/fatigue for assessing readiness for ice chip protocol. SLP Short Term Goal 3 (Week 1): Pt to verbalize swallowing limitations and precautions at mod I.  Skilled Therapeutic Interventions:  Pt was seen for skilled ST targeting dysphagia goals.  SLP facilitated the session with trials of ice chips and teaspoons of thin liquids following oral care to continue working towards initiation of PO diet.  Pt continues to present with limited hyolaryngeal elevation per palpation during trials, suspect due to residual edema.  Pt reports subjective sensation of decreased swelling.  Multiple swallows evident with ice chips and teaspoons of thin liquids.  Pt's vocal quality remained clear before and after trials, no coughing or throat clearing evident across boluses.  SLP reviewed and reinforced rationale for ongoing NPO status with use of video from pt's MBS.  SLP also educated pt on frequent oral care and keeping head of bed elevated >30% for general aspiration precautions.  Pt verbalized understanding. Pt left in recliner, handed off to OT.   Continue per current plan of care.    Function:  Eating Eating   Modified Consistency Diet:  (trials of ice chips with SLP only ) Eating Assist Level: Supervision or verbal cues           Cognition Comprehension Comprehension assist level: Follows complex conversation/direction with no assist  Expression   Expression assist level: Expresses complex ideas: With no assist  Social Interaction Social Interaction assist level: Interacts  appropriately with others with medication or extra time (anti-anxiety, antidepressant).  Problem Solving Problem solving assist level: Solves complex problems: With extra time  Memory Memory assist level: Recognizes or recalls 90% of the time/requires cueing < 10% of the time    Pain Pain Assessment Pain Assessment: No/denies pain  Therapy/Group: Individual Therapy  William [name], William [name] 12/29/2015, 12:43 PM

## 2015-12-29 NOTE — Progress Notes (Signed)
William [name] is a 60 y.o. male 1956/05/10 [id number]  Subjective: No new complaints. No new problems. Slept well. Feeling OK.  Objective: Vital signs in last 24 hours: Temp:  [99.2 F (37.3 C)] 99.2 F (37.3 C) (03/25 0559) Pulse Rate:  [119] 119 (03/25 0559) Resp:  [17] 17 (03/25 0559) BP: (147-148)/(100-102) 147/100 mmHg (03/25 0905) SpO2:  [93 %] 93 % (03/25 0559) Weight:  [208 lb 5.4 oz (94.5 kg)] 208 lb 5.4 oz (94.5 kg) (03/25 0559) Weight change:  Last BM Date: 12/28/15  Intake/Output from previous day: 03/24 0701 - 03/25 0700 In: 190 [P.O.:180; I.V.:10] Out: 275 [Urine:275] Last cbgs: CBG (last 3)   Recent Labs  12/29/15 0341 12/29/15 0757 12/29/15 1145  GLUCAP 268* 296* 326*     Physical Exam General: No apparent distress   HEENT: not dry Lungs: Normal effort. Lungs clear to auscultation, no crackles or wheezes. Cardiovascular: Regular rate and rhythm, no edema Abdomen: S/NT/ND; BS(+) Musculoskeletal:  unchanged Neurological: No new neurological deficits Wounds: N/A    Skin: clear  Aging changes Mental state: Alert, oriented, cooperative Nasal FT    Lab Results: BMET    Component Value Date/Time   NA 141 12/28/2015 0824   NA 137 06/07/2015 1041   NA 131* 02/28/2014 1414   K 3.5 12/28/2015 0824   K 3.9 02/28/2014 1414   CL 103 12/28/2015 0824   CL 98 02/28/2014 1414   CO2 27 12/28/2015 0824   CO2 26 02/28/2014 1414   GLUCOSE 322* 12/28/2015 0824   GLUCOSE 154* 06/07/2015 1041   GLUCOSE 424* 02/28/2014 1414   BUN 9 12/28/2015 0824   BUN 10 06/07/2015 1041   BUN 19* 02/28/2014 1414   CREATININE 1.01 12/28/2015 0824   CREATININE 1.0 03/09/2014   CREATININE 1.19 02/28/2014 1414   CREATININE 0.95 12/02/2013 1455   CALCIUM 9.4 12/28/2015 0824   CALCIUM 9.4 02/28/2014 1414   GFRNONAA >60 12/28/2015 0824   GFRNONAA >60 02/28/2014 1414   GFRNONAA 88 12/02/2013 1455   GFRAA >60 12/28/2015 0824   GFRAA >60 02/28/2014 1414   GFRAA >89  12/02/2013 1455   CBC    Component Value Date/Time   WBC 6.1 12/28/2015 0824   WBC 4.6 06/07/2015 1041   WBC 5.7 02/28/2014 1414   RBC 4.82 12/28/2015 0824   RBC 5.02 06/07/2015 1041   RBC 5.32 02/28/2014 1414   HGB 13.8 12/28/2015 0824   HGB 16.2 02/28/2014 1414   HCT 42.2 12/28/2015 0824   HCT 43.6 06/07/2015 1041   HCT 48.3 02/28/2014 1414   PLT 316 12/28/2015 0824   PLT 180 06/07/2015 1041   PLT 199 02/28/2014 1414   MCV 87.6 12/28/2015 0824   MCV 87 06/07/2015 1041   MCV 91 02/28/2014 1414   MCH 28.6 12/28/2015 0824   MCH 29.7 06/07/2015 1041   MCH 30.5 02/28/2014 1414   MCHC 32.7 12/28/2015 0824   MCHC 34.2 06/07/2015 1041   MCHC 33.6 02/28/2014 1414   RDW 14.6 12/28/2015 0824   RDW 15.0 06/07/2015 1041   RDW 14.5 02/28/2014 1414   LYMPHSABS 2.3 12/28/2015 0824   LYMPHSABS 2.8 06/07/2015 1041   MONOABS 0.6 12/28/2015 0824   EOSABS 0.2 12/28/2015 0824   EOSABS 0.1 06/07/2015 1041   BASOSABS 0.0 12/28/2015 0824   BASOSABS 0.0 06/07/2015 1041    Studies/Results: No results found.  Medications: I have reviewed the patient's current medications.  Assessment/Plan:   1. Cervical myelopathy and bilateral C5 radiculopathy secondary to  Cervical spinal stenosis and postoperative abscess 2. DVT Prophylaxis/Anticoagulation: Pharmaceutical: Heparin indicated 3. Pain Management: oxycodone 1 mg per mL, 5-10 mL every 4 hours when necessary 4. Mood: Overt signs of depression, will monitor for adjustment disorder, neuropsychologist evaluation as necessary 5. Neuropsych: This patient is capable of making decisions on his own behalf. 6. Skin/Wound Care: monitor wound for healing 7. Fluids/Electrolytes/Nutrition: Monitor I/O. Check lytes today -continue NGT feedings 8. HTN: Monitor BP bid.  9. DMT2: Monitor BS ac/hs.Continue lantus 25 units at bedtime. Use SSI  -recent trend upward. Increase to 28u qhs 10. Neck abscess: To continue Vanc and  Ceftriaxone 2 grams daily till April 27th 11. Dysphagia with pharyngeal edema--continue NGT for now -clinically showing signs of improvement -advance per SLP recs    Length of stay, days: 2  Sonda PrimesAlex Danial Hlavac , MD 12/29/2015, 2:46 PM

## 2015-12-29 NOTE — Progress Notes (Signed)
Physical Therapy Session Note  Patient Details  Name: William [name] MRN: [id number] Date of Birth: 12-27-1955  Today's Date: 12/29/2015 PT Individual Time: 1415-1445 PT Individual Time Calculation (min): 30 min   Short Term Goals: Week 1:  PT Short Term Goal 1 (Week 1): =LTG due to estimated LOS  Skilled Therapeutic Interventions/Progress Updates:   Pt continues with decreased elbow flexion and shoulder AROM. Some contraction noted with resisted wrist ext activities. This likely irradiating to biceps. Pt with gravity eliminated elbow flexion ROM noted in session. Significant GH joint instability B/L. Pt would continue to benefit from skilled PT services to increase functional mobility.  Therapy Documentation Precautions:  Precautions Precautions: Cervical Precaution Comments: Pt is wearing no brace in bed and applies sitting up in bed Required Braces or Orthoses: Cervical Brace Cervical Brace: Soft collar, At all times Restrictions Weight Bearing Restrictions: No Pain: Pain Assessment Pain Assessment: No/denies pain Mobility:  SBA Locomotion :   SBA with cues for safety Other Treatments:  Pt performs pressdowns with BUE with therapist stabilization of B/L GH joints 2x10. Pt performs static standing 2'x3. Pt performs scap retraction 2x10 and B/L wrist ext AROM 2x10. Gravity eliminated bicep curls 2x10. Irradiation of wrist ext to biceps x2'. AAROM shoulder ROM to 90 degrees with GH stabilization.  See Function Navigator for Current Functional Status.   Therapy/Group: Individual Therapy  Christia ReadingKinney, [name] 12/29/2015, 2:26 PM

## 2015-12-30 ENCOUNTER — Inpatient Hospital Stay (HOSPITAL_COMMUNITY): Payer: Medicaid Other | Admitting: Physical Therapy

## 2015-12-30 DIAGNOSIS — I10 Essential (primary) hypertension: Secondary | ICD-10-CM

## 2015-12-30 DIAGNOSIS — R1310 Dysphagia, unspecified: Secondary | ICD-10-CM

## 2015-12-30 LAB — GLUCOSE, CAPILLARY
GLUCOSE-CAPILLARY: 359 mg/dL — AB (ref 65–99)
GLUCOSE-CAPILLARY: 334 mg/dL — AB (ref 65–99)
Glucose-Capillary: 314 mg/dL — ABNORMAL HIGH (ref 65–99)
Glucose-Capillary: 229 mg/dL — ABNORMAL HIGH (ref 65–99)
Glucose-Capillary: 282 mg/dL — ABNORMAL HIGH (ref 65–99)
Glucose-Capillary: 304 mg/dL — ABNORMAL HIGH (ref 65–99)

## 2015-12-30 LAB — VANCOMYCIN, TROUGH: Vancomycin Tr: 22 ug/mL — ABNORMAL HIGH (ref 10.0–20.0)

## 2015-12-30 MED ORDER — VANCOMYCIN HCL 10 G IV SOLR
1250.0000 mg | Freq: Two times a day (BID) | INTRAVENOUS | Status: DC
  Administered 2015-12-30 – 2016-01-04 (×10): 1250 mg via INTRAVENOUS
  Filled 2015-12-30 (×11): qty 1250

## 2015-12-30 NOTE — Progress Notes (Signed)
William [name] is a 60 y.o. male Feb 12, 1956 [id number]  Subjective:  No new problems. Slept well. Feeling OK.  Objective: Vital signs in last 24 hours: Temp:  [98.3 F (36.8 C)-98.7 F (37.1 C)] 98.3 F (36.8 C) (03/26 0508) Pulse Rate:  [110-113] 113 (03/26 0508) Resp:  [17] 17 (03/26 0508) BP: (135-140)/(99-101) 140/99 mmHg (03/26 0845) SpO2:  [92 %-95 %] 92 % (03/26 0508) Weight:  [218 lb 14.7 oz (99.3 kg)] 218 lb 14.7 oz (99.3 kg) (03/26 0508) Weight change: 10 lb 9.3 oz (4.8 kg) Last BM Date: 12/30/15  Intake/Output from previous day: 03/25 0701 - 03/26 0700 In: -  Out: 550 [Urine:550] Last cbgs: CBG (last 3)   Recent Labs  12/30/15 0342 12/30/15 0753 12/30/15 1157  GLUCAP 359* 334* 314*     Physical Exam General: NAD, obese  HEENT: not dry Lungs: Normal effort. Lungs clear to auscultation, no crackles or wheezes. Cardiovascular: Regular rate and rhythm, no edema Abdomen: S/NT/ND; BS(+) Musculoskeletal:  unchanged Neurological: No new neurological deficits Wounds: N/A    Skin: clear  Aging changes Mental state: Alert, oriented, cooperative Nasal FT    Lab Results: BMET    Component Value Date/Time   NA 141 12/28/2015 0824   NA 137 06/07/2015 1041   NA 131* 02/28/2014 1414   K 3.5 12/28/2015 0824   K 3.9 02/28/2014 1414   CL 103 12/28/2015 0824   CL 98 02/28/2014 1414   CO2 27 12/28/2015 0824   CO2 26 02/28/2014 1414   GLUCOSE 322* 12/28/2015 0824   GLUCOSE 154* 06/07/2015 1041   GLUCOSE 424* 02/28/2014 1414   BUN 9 12/28/2015 0824   BUN 10 06/07/2015 1041   BUN 19* 02/28/2014 1414   CREATININE 1.01 12/28/2015 0824   CREATININE 1.0 03/09/2014   CREATININE 1.19 02/28/2014 1414   CREATININE 0.95 12/02/2013 1455   CALCIUM 9.4 12/28/2015 0824   CALCIUM 9.4 02/28/2014 1414   GFRNONAA >60 12/28/2015 0824   GFRNONAA >60 02/28/2014 1414   GFRNONAA 88 12/02/2013 1455   GFRAA >60 12/28/2015 0824   GFRAA >60 02/28/2014 1414   GFRAA >89 12/02/2013  1455   CBC    Component Value Date/Time   WBC 6.1 12/28/2015 0824   WBC 4.6 06/07/2015 1041   WBC 5.7 02/28/2014 1414   RBC 4.82 12/28/2015 0824   RBC 5.02 06/07/2015 1041   RBC 5.32 02/28/2014 1414   HGB 13.8 12/28/2015 0824   HGB 16.2 02/28/2014 1414   HCT 42.2 12/28/2015 0824   HCT 43.6 06/07/2015 1041   HCT 48.3 02/28/2014 1414   PLT 316 12/28/2015 0824   PLT 180 06/07/2015 1041   PLT 199 02/28/2014 1414   MCV 87.6 12/28/2015 0824   MCV 87 06/07/2015 1041   MCV 91 02/28/2014 1414   MCH 28.6 12/28/2015 0824   MCH 29.7 06/07/2015 1041   MCH 30.5 02/28/2014 1414   MCHC 32.7 12/28/2015 0824   MCHC 34.2 06/07/2015 1041   MCHC 33.6 02/28/2014 1414   RDW 14.6 12/28/2015 0824   RDW 15.0 06/07/2015 1041   RDW 14.5 02/28/2014 1414   LYMPHSABS 2.3 12/28/2015 0824   LYMPHSABS 2.8 06/07/2015 1041   MONOABS 0.6 12/28/2015 0824   EOSABS 0.2 12/28/2015 0824   EOSABS 0.1 06/07/2015 1041   BASOSABS 0.0 12/28/2015 0824   BASOSABS 0.0 06/07/2015 1041    Studies/Results: No results found.  Medications: I have reviewed the patient's current medications.  Assessment/Plan:   1. Cervical myelopathy and bilateral  C5 radiculopathy secondary to Cervical spinal stenosis and postoperative abscess 2. DVT Prophylaxis/Anticoagulation: Pharmaceutical: Heparin indicated 3. Pain Management: oxycodone 1 mg per mL, 5-10 mL every 4 hours when necessary 4. Mood: Overt signs of depression, will monitor for adjustment disorder, neuropsychologist evaluation as necessary 5. Neuropsych: This patient is capable of making decisions on his own behalf. 6. Skin/Wound Care: monitor wound for healing 7. Fluids/Electrolytes/Nutrition: Monitor I/O. Check lytes today -continue NGT feedings 8. HTN: Monitor BP bid.  9. DMT2: Monitor BS ac/hs.Continue lantus 25 units at bedtime. Use SSI  -recent trend upward. Increase to 28u qhs 10. Neck abscess: To continue Vanc and Ceftriaxone 2  grams daily till April 27th 11. Dysphagia with pharyngeal edema--continue NGT for now -clinically showing signs of improvement -advance per SLP recs  Cont current Rx    Length of stay, days: 3  Sonda Primes , MD 12/30/2015, 12:09 PM

## 2015-12-30 NOTE — Progress Notes (Signed)
Pharmacy Antibiotic Note  William [name] is a 60 y.o. male admitted on 12/27/2015 with cervical abscess. Pt antibiotics Vanc and Rocephin. SCr 1.01 (now trending down with baseline ~0.6).   Vancomycin trough 22 mcg/ml (suprasubtherapeutic for goal trough of 15-20)  Plan - Hold Vancomycin for 6 hours to allow for more vancomycin elimination - Change dose to Vancomycin 1250 mg IV Q 12 hrs - Monitor renal function closely - Recheck vancomycin trough at Css on new dose.  Weight: 218 lb 14.7 oz (99.3 kg)  Temp (24hrs), Avg:98.5 F (36.9 C), Min:98.3 F (36.8 C), Max:98.7 F (37.1 C)   Recent Labs Lab 12/24/15 0312  12/25/15 0524 12/27/15 1702 12/27/15 1950  12/28/15 0824 12/28/15 2137 12/30/15 1256  WBC  --   --   --   --   --   --  6.1  --   --   CREATININE 0.63  --  0.67 1.07 1.09  --  1.01  --   --   VANCOTROUGH  --   < >  --  20  --   < >  --  14 22*  < > = values in this interval not displayed.  Estimated Creatinine Clearance: 91.9 mL/min (by C-G formula based on Cr of 1.01).    Allergies  Allergen Reactions  . Metformin And Related Other (See Comments)    Possible liver inflammation  . Prednisone Other (See Comments)    Disoriented   . Tamsulosin Other (See Comments)    Nose bleed  . Other Other (See Comments)    Reports all narcotics make him sick. Pt premeds with Reglan to stop nausea    Antimicrobials this admission: Vancomycin 3/16>> Zosyn 3/16>>3/20 CTX 3/20>>  Dose adjustments this admission: 3/20 VT = 7 on 1g Q 12 - > increased to 1g Q 8  3/23 VT = 20, drawn 1 hr early, but scr up 0.67 > 1.07, concerning for accumulation. -- > decrease to 750 Q8  3/23 VR was 14 3/24 VT = 14 on 750mg  IV q8h ->back to 1gm IV q8h 3/26 VT =22 on  1gm IV q8h Pt Ke 0.063 >> wait 6hr then restart @ 1250mg  Q12  Microbiology results: 3/16 Cervical wound: neg, no anaerobes   Thank you for allowing pharmacy to be a part of this patient's care.  Remi HaggardAlyson N. Estera Ozier,  PharmD Clinical Pharmacist- Resident Pager: [phone]   12/30/2015 2:20 PM

## 2015-12-30 NOTE — Progress Notes (Signed)
Physical Therapy Session Note  Patient Details  Name: William [name] MRN: [id number] Date of Birth: Aug 08, 1956  Today's Date: 12/30/2015 PT Individual Time: [id number] PT Individual Time Calculation (min): 47 min   Short Term Goals: Week 1:  PT Short Term Goal 1 (Week 1): =LTG due to estimated LOS  Skilled Therapeutic Interventions/Progress Updates:    Pt received in recliner & agreeable to PT. Pt without cervical collar on, PT instructed pt on importance of wearing cervical collar at all times and re-educated pt on cervical precautions as pt reported he was unaware of these. PT assisted pt with donning soft collar. Pt ambulated >300 ft without AD with mod I and diminished BUE swing during gait. Pt completed car transfer with mod I from car simulated seat height. Pt completed PNF BUE strengthening exercises 2 sets of 10 reps each BUE with assistance from PT. Pt demonstrates excessive R scapular elevation to help assist with raising RUE 2/2 decreased arm strength. Pt able to recall cervical precautions at end of session and able to verbalize understanding of precautions when PT prompted pt with different scenarios. At end of session pt left in recliner with BLE elevated & all needs within reach.  Therapy Documentation Precautions:  Precautions Precautions: Cervical Precaution Comments: Pt is wearing no brace in bed and applies sitting up in bed Required Braces or Orthoses: Cervical Brace Cervical Brace: Soft collar, At all times Restrictions Weight Bearing Restrictions: No   Pain: Pain Assessment Pain Assessment: No/denies pain   See Function Navigator for Current Functional Status.   Therapy/Group: Individual Therapy  Sandi MariscalVictoria M Norm Wray 12/30/2015, 4:18 PM

## 2015-12-31 ENCOUNTER — Inpatient Hospital Stay (HOSPITAL_COMMUNITY): Payer: Medicaid Other | Admitting: Speech Pathology

## 2015-12-31 ENCOUNTER — Inpatient Hospital Stay (HOSPITAL_COMMUNITY): Payer: Medicaid Other | Admitting: Occupational Therapy

## 2015-12-31 ENCOUNTER — Inpatient Hospital Stay (HOSPITAL_COMMUNITY): Payer: Medicaid Other

## 2015-12-31 LAB — GLUCOSE, CAPILLARY
GLUCOSE-CAPILLARY: 260 mg/dL — AB (ref 65–99)
GLUCOSE-CAPILLARY: 387 mg/dL — AB (ref 65–99)
GLUCOSE-CAPILLARY: 275 mg/dL — AB (ref 65–99)
GLUCOSE-CAPILLARY: 363 mg/dL — AB (ref 65–99)
Glucose-Capillary: 164 mg/dL — ABNORMAL HIGH (ref 65–99)
Glucose-Capillary: 209 mg/dL — ABNORMAL HIGH (ref 65–99)
Glucose-Capillary: 236 mg/dL — ABNORMAL HIGH (ref 65–99)

## 2015-12-31 MED ORDER — INSULIN GLARGINE 100 UNIT/ML ~~LOC~~ SOLN
35.0000 [IU] | Freq: Every day | SUBCUTANEOUS | Status: DC
  Administered 2015-12-31 – 2016-01-03 (×4): 35 [IU] via SUBCUTANEOUS
  Filled 2015-12-31 (×5): qty 0.35

## 2015-12-31 MED ORDER — INSULIN GLARGINE 100 UNIT/ML ~~LOC~~ SOLN
10.0000 [IU] | Freq: Every day | SUBCUTANEOUS | Status: DC
  Administered 2015-12-31 – 2016-01-04 (×5): 10 [IU] via SUBCUTANEOUS
  Filled 2015-12-31 (×5): qty 0.1

## 2015-12-31 NOTE — Progress Notes (Signed)
Occupational Therapy Session Note  Patient Details  Name: William [name] MRN: [id number] Date of Birth: Jun 28, 1956  Today's Date: 12/31/2015 OT Individual Time: 1015-1100 and 1400-1430 OT Individual Time Calculation (min): 45 min and 30 min   Short Term Goals: Week 1:  OT Short Term Goal 1 (Week 1): STG = LTGs due to ELOS  Skilled Therapeutic Interventions/Progress Updates:    Session One: Pt seen for OT ADL bathing/dressing session. Pt sitting up in recliner upon arrival, agreeable to tx session. Pt ambulated throughout room to gather clothing items mod I. He completed toileting task mod I. Supervision required for shower transfer and bathed seated on shower chair mod I. He dressed seated in recliner with min cuing provided for problem solving dressing positions due to limited UE ROM. Pt left sitting in recliner at end of session, all needs in reach.  Session Two: Pt seen for OT session focusing on ADL/IADL re-training and functional transfers. Pt sitting up in recliner upon arrival, agreeable to tx session. He ambulated throughout unit mod I, able to manage IV pole. He completed laundry task with assist to open lid due to limited UE ROM. In ADL apartment, educated regarding use of tub transfer bench. He completed simulated transfer at supervision level, and voiced liking this as an option for home use. He sat on low soft surface couch in apartment at mod I level. Discussed with pt bathroom modifications including grab bars and hand held shower. Also discussed energy conservation techniques and 3:1 hospital stay to recovery ratio. Pt frustrated that recovery is slow, however, voiced willingness to work hard to get throughout it. He returned to room at end of session, left sitting in recliner with all needs in reach.   Therapy Documentation Precautions:  Precautions Precautions: Cervical Precaution Comments: Pt is wearing no brace in bed and applies sitting up in bed Required Braces or Orthoses:  Cervical Brace Cervical Brace: Soft collar, At all times Restrictions Weight Bearing Restrictions: No Pain:   No/ denies pain  See Function Navigator for Current Functional Status.   Therapy/Group: Individual Therapy  Dewalt, Jalan Fariss C 12/31/2015, 7:23 AM

## 2015-12-31 NOTE — Progress Notes (Signed)
[facility] PHYSICAL MEDICINE & REHABILITATION     PROGRESS NOTE    Subjective/Complaints: Feels that he will pass his swallowing test today. Thinks that he's ready to go home.  ROS: Pt denies fever, rash/itching, headache, blurred or double vision, nausea, vomiting, abdominal pain, diarrhea, chest pain, shortness of breath, palpitations, dysuria, dizziness, back pain, bleeding, anxiety, or depression   Objective: Vital Signs: Blood pressure 147/94, pulse 110, temperature 98 F (36.7 C), temperature source Oral, resp. rate 18, weight 98 kg (216 lb 0.8 oz), SpO2 94 %. No results found. No results for input(s): WBC, HGB, HCT, PLT in the last 72 hours. No results for input(s): NA, K, CL, GLUCOSE, BUN, CREATININE, CALCIUM in the last 72 hours.  Invalid input(s): CO CBG (last 3)   Recent Labs  12/31/15 0128 12/31/15 0441 12/31/15 0750  GLUCAP 260* 387* 275*    Wt Readings from Last 3 Encounters:  12/31/15 98 kg (216 lb 0.8 oz)  12/24/15 105.5 kg (232 lb 9.4 oz)  12/05/15 108.727 kg (239 lb 11.2 oz)    Physical Exam:  HEENT NGT. Staples in neck, incision cdi General: No acute distress Mood and affect are appropriate Heart: Regular rate and rhythm no rubs murmurs or rubs Lungs: Clear to auscultation, normal effort.  no rales or wheezes Abdomen: Positive bowel sounds, soft nontender to palpation, nondistended Extremities: No clubbing, cyanosis, or edema Skin: No evidence of breakdown, no evidence of rash Neurologic: Cranial nerves II through XII intact, motor strength is 1+ to 2-/5 in bilateral deltoid, 3+ tricep, 4 grip,4+ hip flexor, knee extensors, ankle dorsiflexor and plantar flexor Sensory exam normal sensation to light touch in bilateral upper and lower extremities Cerebellar exam normal finger to nose to finger as well as heel to shin in bilateral upper and lower extremities Musculoskeletal: Full range of motion in all 4 extremities. No joint swelling, Mild dorsum of  the hand swelling/edema, nontender no erythema Deep tendon reflexes are hyporeflexic bilateral upper and lower limbs Cognitively he's appropriate Psych: pleasant and appropriate    Assessment/Plan: 1. Weakness and mobility/functional deficits secondary to cervical myelopathy/C5 radiculopathy which require 3+ hours per day of interdisciplinary therapy in a comprehensive inpatient rehab setting. Physiatrist is providing close team supervision and 24 hour management of active medical problems listed below. Physiatrist and rehab team continue to assess barriers to discharge/monitor patient progress toward functional and medical goals.  Function:  Bathing Bathing position   Position: Wheelchair/chair at sink  Bathing parts Body parts bathed by patient: Right arm, Left arm, Chest, Abdomen, Front perineal area, Right upper leg, Left upper leg, Right lower leg, Left lower leg Body parts bathed by helper: Back, Buttocks  Bathing assist Assist Level:  (Max assist)      Upper Body Dressing/Undressing Upper body dressing   What is the patient wearing?: Pull over shirt/dress     Pull over shirt/dress - Perfomed by patient: Thread/unthread right sleeve, Thread/unthread left sleeve Pull over shirt/dress - Perfomed by helper: Put head through opening, Pull shirt over trunk        Upper body assist Assist Level:  (Max assist)      Lower Body Dressing/Undressing Lower body dressing   What is the patient wearing?: Underwear, Pants, Non-skid slipper socks Underwear - Performed by patient: Thread/unthread right underwear leg, Pull underwear up/down, Thread/unthread left underwear leg Underwear - Performed by helper: Pull underwear up/down Pants- Performed by patient: Thread/unthread right pants leg, Thread/unthread left pants leg, Pull pants up/down Pants- Performed by  helper: Pull pants up/down Non-skid slipper socks- Performed by patient: Don/doff right sock, Don/doff left sock                     Lower body assist Assist for lower body dressing: Supervision or verbal cues      Toileting Toileting   Toileting steps completed by patient: Adjust clothing prior to toileting, Performs perineal hygiene, Adjust clothing after toileting Toileting steps completed by helper: Adjust clothing after toileting Toileting Assistive Devices: Grab bar or rail  Toileting assist Assist level: Supervision or verbal cues   Transfers Chair/bed transfer   Chair/bed transfer method: Ambulatory Chair/bed transfer assist level: Supervision or verbal cues Chair/bed transfer assistive device: Patent attorneyWalker     Locomotion Ambulation     Max distance: 300 ft Assist level: No help, No cues, assistive device, takes more than a reasonable amount of time   Wheelchair          Cognition Comprehension Comprehension assist level: Follows complex conversation/direction with no assist  Expression Expression assist level: Expresses complex ideas: With no assist  Social Interaction Social Interaction assist level: Interacts appropriately with others with medication or extra time (anti-anxiety, antidepressant).  Problem Solving Problem solving assist level: Solves complex problems: With extra time  Memory Memory assist level: Recognizes or recalls 90% of the time/requires cueing < 10% of the time  Medical Problem List and Plan: 1. Cervical myelopathy and bilateral C5 radiculopathy secondary to Cervical spinal stenosis and postoperative abscess  -encouraged patient to discuss goals and needs today with therapy. 2. DVT Prophylaxis/Anticoagulation: Pharmaceutical: Heparin indicated 3. Pain Management:  oxycodone 1 mg per mL, 5-10 mL every 4 hours when necessary 4. Mood: Overt signs of depression, will monitor for adjustment disorder, neuropsychologist evaluation as necessary 5. Neuropsych: This patient is capable of making decisions on his own behalf. 6. Skin/Wound Care: monitor wound for healing 7.  Fluids/Electrolytes/Nutrition:   -continue NGT feedings 8. HTN: Monitor BP bid.  9. DMT2: Monitor BS ac/hs. Use SSI   -recent trend upward. Increase lantus  to 35 u qhs and add 10u qam 10. Neck abscess: To continue Vanc  and Ceftriaxone 2 grams daily till April 27th-He will need to go home on IV antibiotics and a more permanent IV access line would need to be placed 11. Dysphagia with pharyngeal edema--continue NGT    -clinically showing signs of improvement  -MBS?     LOS (Days) 4 A FACE TO FACE EVALUATION WAS PERFORMED  SWARTZ,ZACHARY T 12/31/2015 9:05 AM

## 2015-12-31 NOTE — Progress Notes (Signed)
Physical Therapy Session Note  Patient Details  Name: William [name] Bugaj MRN: [id number] Date of Birth: Aug 09, 1956  Today's Date: 12/31/2015 PT Individual Time: 0800-0900 PT Individual Time Calculation (min): 60 min   Short Term Goals: Week 1:  PT Short Term Goal 1 (Week 1): =LTG due to estimated LOS  Skilled Therapeutic Interventions/Progress Updates:   Pt expressing that he is ready to go home. D/c planning discussion with pt while RN hanging IV meds in regards to goals, concerns with d/c and home support. Pt performing gait on unit overall mod I except supervision due to IV pole management initially but then pt able to manage this himself without difficulty. Performed car transfer training, dynamic gait and balance activities, stair negotiation, functional use of BUE for ball toss and toss and catch during gait for dual task training as well as dribbling soccer ball and kicking with alternating BLE, and introduced [place] Level B HEP for BLE strengthening and balance. Discussed pt's request for BUE HEP with primary OT. End of session pt performing toileting needs in standing position for urination and transferred modified independent back to recliner to await next therapy session with all needs in reach.   Therapy Documentation Precautions:  Precautions Precautions: Cervical Precaution Comments: Pt is wearing no brace in bed and applies sitting up in bed Required Braces or Orthoses: Cervical Brace Cervical Brace: Soft collar, At all times Restrictions Weight Bearing Restrictions: No  Pain:  No complaints of pain.    See Function Navigator for Current Functional Status.   Therapy/Group: Individual Therapy  Karolee StampsGray, Kayla Weekes Darrol PokeBrescia  Rada Zegers B. Jorrell Kuster, PT, DPT  12/31/2015, 9:17 AM

## 2015-12-31 NOTE — IPOC Note (Signed)
Overall Plan of Care [facility](IPOC) Patient Details Name: Jill SideWarren Bedoya MRN: [id number] DOB: 1955/10/17  Admitting Diagnosis: Central Cord  Hospital Problems: Active Problems:   Central cord syndrome [facility](HCC)     Functional Problem List: Nursing Edema, Endurance, Medication Management, Motor, Nutrition, Pain, Perception, Safety, Sensory  PT Balance, Safety, Motor  OT Balance, Motor  SLP Nutrition  TR         Basic ADL's: OT Eating, Grooming, Bathing, Dressing, Toileting     Advanced  ADL's: OT       Transfers: PT Bed Mobility, Car, Bed to Chair, Occupational psychologisturniture  OT Toilet, Research scientist (life sciences)Tub/Shower     Locomotion: PT Ambulation, Stairs     Additional Impairments: OT Fuctional Use of Upper Extremity  SLP Swallowing      TR      Anticipated Outcomes Item Anticipated Outcome  Self Feeding Supervision/setup  Swallowing  mod I with the least restrictive diet (pending swallow function after pharyngeal edema resolved)   Basic self-care  Supervision/setup  Toileting  Supervision/setup   Bathroom Transfers Supervision/setup  Bowel/Bladder  patient will remain cont x2  Transfers  mod I  Locomotion  mod I household, S community mobility and stairs  Communication     Cognition     Pain  pain < 3 on 0-10 pain scale.   Safety/Judgment  Patient remain free from falls on Rehab.    Therapy Plan: PT Intensity: Minimum of 1-2 x/day ,45 to 90 minutes PT Frequency: 5 out of 7 days PT Duration Estimated Length of Stay: 7-10 days OT Intensity: Minimum of 1-2 x/day, 45 to 90 minutes OT Frequency: 5 out of 7 days OT Duration/Estimated Length of Stay: 7-10 days SLP Intensity: Minumum of 1-2 x/day, 30 to 90 minutes SLP Frequency: 3 to 5 out of 7 days SLP Duration/Estimated Length of Stay: 7-10 days       Team Interventions: Nursing Interventions Patient/Family Education, Pain Management, Medication Management, Cognitive Remediation/Compensation, Dysphagia/Aspiration Printmakerrecaution Training, Discharge  Planning  PT interventions Ambulation/gait training, Warden/rangerBalance/vestibular training, Community reintegration, Discharge planning, DME/adaptive equipment instruction, Neuromuscular re-education, Functional mobility training, Patient/family education, Psychosocial support, Splinting/orthotics, Stair training, Therapeutic Exercise, Therapeutic Activities, UE/LE Strength taining/ROM, UE/LE Coordination activities  OT Interventions Warden/rangerBalance/vestibular training, Discharge planning, Disease mangement/prevention, DME/adaptive equipment instruction, Functional electrical stimulation, Functional mobility training, Neuromuscular re-education, Pain management, Patient/family education, Psychosocial support, Self Care/advanced ADL retraining, Splinting/orthotics, Therapeutic Activities, Therapeutic Exercise, UE/LE Strength taining/ROM, UE/LE Coordination activities  SLP Interventions Dysphagia/aspiration precaution training, Patient/family education, Therapeutic Activities  TR Interventions    SW/CM Interventions Discharge Planning, Psychosocial Support, Patient/Family Education    Team Discharge Planning: Destination: PT-Home ,OT- Home , SLP-Home Projected Follow-up: PT-Outpatient PT, OT-  Outpatient OT, SLP- (pending progress) Projected Equipment Needs: PT-To be determined, OT- To be determined, SLP-To be determined Equipment Details: PT- , OT-  Patient/family involved in discharge planning: PT- Patient,  OT-Patient, SLP-Patient  MD ELOS: 7-10 days Medical Rehab Prognosis:  Excellent Assessment: The patient has been admitted for CIR therapies with the diagnosis of central cord syndrome, C5 radiculopathies. The team will be addressing functional mobility, strength, stamina, balance, safety, adaptive techniques and equipment, self-care, bowel and bladder mgt, patient and caregiver education, pain mgt, orthotic/splints, ego support, swallowing, communication, community reintegration. Goals have been set at  supervision for ADL's and self-care and mod I for mobility and swallowing.    Ranelle OysterZachary T. Swartz, MD, FAAPMR      See Team Conference Notes for weekly updates to the plan of care

## 2015-12-31 NOTE — Progress Notes (Signed)
Speech Language Pathology Daily Session Note  Patient Details  Name: William [name] Hitson MRN: [id number] Date of Birth: 1956-07-15  Today's Date: 12/31/2015 SLP Individual Time: 0930-1000 SLP Individual Time Calculation (min): 30 min  Short Term Goals: Week 1: SLP Short Term Goal 1 (Week 1): Pt to tolerate trials of ice chips without overt s/s aspiration. SLP Short Term Goal 2 (Week 1): Pt to demonstrate awareness of dysphagia/fatigue for assessing readiness for ice chip protocol. SLP Short Term Goal 3 (Week 1): Pt to verbalize swallowing limitations and precautions at mod I.  Skilled Therapeutic Interventions: Skilled treatment session focused on addressing dysphagia goals. SLP facilitated session by providing set-up assist for oral care via suctioning.  SLP also provided patient with trials of ice chips; 5/5 trials resulted in no overt s/s of aspiration.  SLP administered teaspoon sips of water resulted in 1/5 throat clears.  Straw sips of water with Mod verbal cues for portion control resulted in throat clears or coughs in ~60% of trials.  Patient demonstrated multiple swallows, 3-4 across trials today.  Patient reports that he feels swelling has improved; however, with consistent s/s with trials recommend initiation of the Water Protocol at next visit.    Function:  Eating Eating Eating activity did not occur:  (trilas with SLP) Modified Consistency Diet: No Eating Assist Level: Supervision or verbal cues           Cognition Comprehension Comprehension assist level: Follows complex conversation/direction with no assist  Expression   Expression assist level: Expresses complex ideas: With no assist  Social Interaction Social Interaction assist level: Interacts appropriately with others with medication or extra time (anti-anxiety, antidepressant).  Problem Solving Problem solving assist level: Solves complex problems: With extra time  Memory Memory assist level: Recognizes or recalls 90%  of the time/requires cueing < 10% of the time    Pain Pain Assessment Pain Assessment: No/denies pain  Therapy/Group: Individual Therapy  Charlane FerrettiMelissa Jossue Rubenstein, M.A., CCC-SLP [phone]   Isabelly Kobler 12/31/2015, 10:40 AM

## 2015-12-31 NOTE — Progress Notes (Signed)
Social Work  Social Work Assessment and Plan  Patient Details  Name: William [name] MRN: [id number] Date of Birth: 10/30/1955  Today's Date: 12/28/2015  Problem List:  Patient Active Problem List   Diagnosis Date Noted  . Central cord syndrome (HCC) 12/27/2015  . Uncontrolled type 2 diabetes mellitus with complication, with long-term current use of insulin (HCC)   . Chronic hepatitis C without hepatic coma (HCC)   . H/O cervical spine surgery   . Tachycardia   . Tachypnea   . Hypokalemia   . Encounter for nasogastric (NG) tube placement   . Neck abscess   . Brodie's abscess of cervical spine (HCC) 12/20/2015  . Abscess, neck   . Endotracheal tube present   . Post-operative wound abscess   . Respiratory failure with hypoxia (HCC)   . Cervical spinal stenosis 12/05/2015  . AA (alcohol abuse) 10/11/2015  . Esophageal reflux   . Alcohol abuse   . DKA (diabetic ketoacidoses) (HCC) 08/09/2015  . Chest pain 08/09/2015  . Hyponatremia 08/09/2015  . Essential hypertension 08/09/2015  . Migraine with aura 08/09/2015  . Insomnia 08/09/2015  . Chronic pain 08/09/2015  . Diabetes mellitus (HCC) 08/09/2015  . Diabetic ketoacidosis without coma associated with type 2 diabetes mellitus (HCC)   . Weakness of left lower extremity   . Cervical radiculopathy due to diabetes mellitus (HCC) 08/03/2015  . Left upper extremity numbness 08/03/2015  . Right upper extremity numbness 08/03/2015  . Uncontrolled type 2 diabetes mellitus with background retinopathy (HCC) 07/19/2015  . Degenerative arthritis of thumb 07/10/2015  . Chronic radicular cervical pain 06/08/2015  . Degeneration of lumbar/lumbosacral disc without myelopathy 06/08/2015  . Degeneration of intervertebral disc of lumbosacral region 06/08/2015  . Metabolic syndrome 05/31/2015  . Knee pain 07/12/2014  . Balanitis 07/12/2014  . History of cocaine use 07/12/2014  . Depression 07/12/2014  . Chronic pain of multiple joints  07/12/2014  . Hx-TIA (transient ischemic attack) 07/12/2014  . Hx of migraine headaches 07/12/2014  . Obesity, Class I, BMI 30.0-34.9 (see actual BMI) 07/12/2014  . Cocaine abuse 07/12/2014  . Major depressive disorder with single episode (HCC) 07/12/2014  . Adiposity 07/12/2014  . Ache in joint 07/12/2014  . H/O disease 07/12/2014  . Personal history of transient ischemic attack (TIA), and cerebral infarction without residual deficits 07/12/2014  . Chronic hepatitis C (HCC) 07/11/2014  . Hyperlipidemia LDL goal <100 07/11/2014  . Chronic hepatitis C virus infection (HCC) 07/11/2014  . HLD (hyperlipidemia) 07/11/2014  . Uncontrolled type 2 diabetes mellitus with peripheral circulatory disorder (HCC) 12/02/2013  . Hypertension, goal below 140/90 12/02/2013  . Essential (primary) hypertension 12/02/2013  . Type 2 diabetes mellitus with peripheral angiopathy (HCC) 12/02/2013   Past Medical History:  Past Medical History  Diagnosis Date  . Hypertension   . Diabetes mellitus without complication (HCC)   . Chronic hepatitis C without hepatic coma (HCC)   . History of cocaine use   . Depression   . Arthritis   . History of TIA (transient ischemic attack)   . Hyperlipidemia   . History of migraine headaches   . Obesity, Class II, BMI 35-39.9   . BPH with obstruction/lower urinary tract symptoms   . Balanitis   . Urinary retention   . Tobacco abuse    Past Surgical History:  Past Surgical History  Procedure Laterality Date  . Rotator  cuff surgery       left shoulder   . Spider bite  on neck  . Anterior cervical decomp/discectomy fusion N/A 12/05/2015    Procedure: Anterior Cervical Decompression Fusion Cervical three-four, Cervical four-five, Cervical five-six, Cervical six-seven;  Surgeon: Julio Sicks, MD;  Location: [facility];  Service: Neurosurgery;  Laterality: N/A;  . Anterior cervical decompression for epidural abscess N/A 12/20/2015    Procedure: ANTERIOR CERVICAL  DECOMPRESSION FOR EPIDURAL ABSCESS;  Surgeon: Hilda Lias, MD;  Location: [facility];  Service: Neurosurgery;  Laterality: N/A;  ANTERIOR CERVICAL DECOMPRESSION FOR EPIDURAL ABSCESS   Social History:  reports that he has been smoking Cigarettes.  He has a 3 pack-year smoking history. He does not have any smokeless tobacco history on file. He reports that he drinks alcohol. He reports that he uses illicit drugs.  Family / Support Systems Marital Status: Divorced (but engaged) How Long?: "together" with fiance x 4 yrs Patient Roles: Partner, Parent Spouse/Significant Other: fiance, Hessie Dibble") Malone @ (C) [phone] Children: Pt has 2 adult children but not living in the area Anticipated Caregiver: fiance Ability/Limitations of Caregiver: no limitations Caregiver Availability: 24/7 Family Dynamics: Pt describes fiance as very supportive.   Social History Preferred language: English Religion: None Cultural Background: NA Read: Yes Write: Yes Employment Status: Disabled Fish farm manager Issues: None Guardian/Conservator: None - per MD, pt is capable of making decisions on his own behalf.   Abuse/Neglect Physical Abuse: Denies Verbal Abuse: Denies Sexual Abuse: Denies Exploitation of patient/patient's resources: Denies Self-Neglect: Denies  Emotional Status Pt's affect, behavior adn adjustment status: Pt very pleasant, talkative and hopeful that he will be able to pass MBS soon!  Able to complete assessment interview without difficulty.  He admits frustration with NG tube and with functional limitations.  He denies any significant emotional distress or s/s of depression.  Will monitor and refer to neuropsych as indicated. Recent Psychosocial Issues: None Pyschiatric History: none Substance Abuse History: Pt with h/o drug abuse.    Patient / Family Perceptions, Expectations & Goals Pt/Family understanding of illness & functional limitations: Pt with good,  basic understanding of the infection that developed following the initial surgery which led to significant decline in function.  Good awareness of current functional limitations/ need for CIR. Premorbid pt/family roles/activities: Pt was independent prior to initial surgery.   Anticipated changes in roles/activities/participation: Fiance may need t offer some increased physical assist and home management assist. Pt/family expectations/goals: Pt hopeful his UE function will continue to return.  Primary focus is swallow and getting the NG tube out.  Community Resources [employer]: None Premorbid Home Care/DME Agencies: None Transportation available at discharge: yes  Discharge Planning Living Arrangements: Spouse/significant other Support Systems: Spouse/significant other Type of Residence: Private residence Insurance Resources: [employer] (specify county) Air cabin crew) Surveyor, quantity Resources: SSD, SSI Financial Screen Referred: No Living Expenses: Psychologist, sport and exercise Management: Patient Does the patient have any problems obtaining your medications?: No Home Management: Pt and fiance shared responsibilities. Patient/Family Preliminary Plans: Pt plans to return home with Trish as his primary support. Social Work Anticipated Follow Up Needs: HH/OP Expected length of stay: 7-10 days  Clinical Impression Pleasant gentleman here following an initial ACDF surgery 3/1 with d/c home then readmit due to infection developing.  Making good gains as swelling decreases and his primary focus is wanting to pass his swallow exam and get rid of NG tube.  Denies any significant emotional distress. Good support of fiance.  Will follow for support and d/c planning needs (known that he will d/c with home IV abx needs.)  Mistina Coatney  12/28/2015, 3:50 PM

## 2015-12-31 NOTE — Progress Notes (Signed)
Speech Language Pathology Daily Session Note  Patient Details  Name: William [name] MRN: [id number] Date of Birth: 09-12-1956  Today's Date: 12/31/2015 SLP Individual Time: 1445-1515 SLP Individual Time Calculation (min): 30 min  Short Term Goals: Week 1: SLP Short Term Goal 1 (Week 1): Pt to tolerate trials of ice chips without overt s/s aspiration. SLP Short Term Goal 2 (Week 1): Pt to demonstrate awareness of dysphagia/fatigue for assessing readiness for ice chip protocol. SLP Short Term Goal 3 (Week 1): Pt to verbalize swallowing limitations and precautions at mod I.  Skilled Therapeutic Interventions: Skilled treatment session focused on addressing dysphagia goals. SLP facilitated session by providing set-up assist for oral care via suctioning. SLP also provided patient with trials of ice chips; 5/5 trials resulted in no overt s/s of aspiration. SLP administered teaspoon sips of water resulted in 1/5 with subtle throat clear. Straw sips of water with Min verbal cues for portion control resulted in subtle throat clear in 20% of trials. Patient demonstrated multiple swallows, 2-3 across trials today. Recommend initiation of water protocol. Patient verbalized understanding of all information. Patient left upright in recliner with all needs within reach. Continue with current plan of care.   Function:  Eating Eating   Modified Consistency Diet: No Eating Assist Level: Supervision or verbal cues (with trials via SLP)           Cognition Comprehension Comprehension assist level: Follows complex conversation/direction with no assist  Expression   Expression assist level: Expresses complex ideas: With no assist  Social Interaction Social Interaction assist level: Interacts appropriately with others with medication or extra time (anti-anxiety, antidepressant).  Problem Solving Problem solving assist level: Solves complex problems: With extra time  Memory Memory assist level: Recognizes  or recalls 90% of the time/requires cueing < 10% of the time    Pain No/Denies Pain   Therapy/Group: Individual Therapy  William [name] 12/31/2015, 3:30 PM

## 2015-12-31 NOTE — Progress Notes (Signed)
[facility]  Patient Status: New pt for [facility] this admission  [facility] is providing the following services:  HHRN, PT , OT and Home Infusion Pharmacy team for home IV ABX. [facility] hospital infusion coordinator will provide in hospital teaching regarding IV ABX set up and administration to support independence and competency at home. [facility] will follow William [name] until DC to support transition home and ensure his home care needs are met.  If patient discharges after hours, please call [phone].   Larry Sierras 12/31/2015, 8:35 PM

## 2015-12-31 NOTE — Progress Notes (Signed)
Occupational Therapy Session Note  Patient Details  Name: William [name] Hans[place] MRN: [id number] Date of Birth: 1956/09/01  Today's Date: 12/31/2015 OT Individual Time: [id number] OT Individual Time Calculation (min): 43 min    Short Term Goals: Week 1:  OT Short Term Goal 1 (Week 1): STG = LTGs due to ELOS  Skilled Therapeutic Interventions/Progress Updates:  Upon entering the room, pt supine in bed with no c/o pain. Pt stating, "I'm depressed." Pt very tearful throughout session. OT providing pt information regarding coping skills, advocating for his own care, and taking ownership of his goals. Pt able to provide therapist with several examples of coping strategies that he could engage in with current limitations. OT encouraged him to begin doing so in order to decrease feelings of depression. Pt showing therapist pictures of himself from first hospital admission in which therapist used as a way to show pt perspective towards current progress towards OT goals. Pt ambulated while holding onto IV pole with mod I 60' in dayroom on carpeted surface. Pt engaged in 1 set of 10 B shoulder horizontal and shoulder flexion exercises with slides on table top. Pt returning to room for toileting task. Pt performing hygiene and clothing management with mod I and returning to recliner chair at end of session. Call bell and all needed items within reach upon exiting the room.   Therapy Documentation Precautions:  Precautions Precautions: Cervical Precaution Comments: Pt is wearing no brace in bed and applies sitting up in bed Required Braces or Orthoses: Cervical Brace Cervical Brace: Soft collar, At all times Restrictions Weight Bearing Restrictions: No General:   Vital Signs: Therapy Vitals Temp: 98.9 F (37.2 C) Temp Source: Oral Pulse Rate: (!) 120 Resp: 18 BP: 131/89 mmHg Patient Position (if appropriate): Sitting Oxygen Therapy SpO2: 93 % O2 Device: Not Delivered  See Function Navigator for  Current Functional Status.   Therapy/Group: Individual Therapy  Lowella Gripittman, Shalee Paolo L 12/31/2015, 3:55 PM

## 2015-12-31 NOTE — Care Management Note (Signed)
[facility] Individual Statement of Services  Patient Name:  William [name]  Date:  12/31/2015  Welcome to the [facility].  Our goal is to provide you with an individualized program based on your diagnosis and situation, designed to meet your specific needs.  With this comprehensive rehabilitation program, you will be expected to participate in at least 3 hours of rehabilitation therapies Monday-Friday, with modified therapy programming on the weekends.  Your rehabilitation program will include the following services:  Physical Therapy (PT), Occupational Therapy (OT), Speech Therapy (ST), 24 hour per day rehabilitation nursing, Therapeutic Recreaction (TR), Neuropsychology, Case Management (Social Worker), Rehabilitation Medicine, Nutrition Services and Pharmacy Services  Weekly team conferences will be held on Tuesdays to discuss your progress.  Your Social Worker will talk with you frequently to get your input and to update you on team discussions.  Team conferences with you and your family in attendance may also be held.  Expected length of stay: 7-10 days  Overall anticipated outcome: modified independent  Depending on your progress and recovery, your program may change. Your Social Worker will coordinate services and will keep you informed of any changes. Your Social Worker's name and contact numbers are listed  below.  The following services may also be recommended but are not provided by the [facility]:   Driving Evaluations  Home Health Rehabiltiation Services  Outpatient Rehabilitation Services    Arrangements will be made to provide these services after discharge if needed.  Arrangements include referral to agencies that provide these services.  Your insurance has been verified to be:  Medicaid Your primary doctor is:  Dr. Schuyler AmorWilliam Plonk  Pertinent information will be shared with your doctor and your insurance  company.  Social Worker:  [place] Ruthia Person, [place] [phone] or ([phone]) [phone]   Information discussed with and copy given to patient by: Amada JupiterHOYLE, [place] Roths[place], 12/31/2015, 3:54 PM

## 2016-01-01 ENCOUNTER — Inpatient Hospital Stay (HOSPITAL_COMMUNITY): Payer: Medicaid Other | Admitting: Occupational Therapy

## 2016-01-01 ENCOUNTER — Inpatient Hospital Stay (HOSPITAL_COMMUNITY): Payer: Medicaid Other | Admitting: Physical Therapy

## 2016-01-01 ENCOUNTER — Inpatient Hospital Stay (HOSPITAL_COMMUNITY): Payer: Medicaid Other | Admitting: *Deleted

## 2016-01-01 ENCOUNTER — Inpatient Hospital Stay (HOSPITAL_COMMUNITY): Payer: Medicaid Other

## 2016-01-01 ENCOUNTER — Inpatient Hospital Stay (HOSPITAL_COMMUNITY): Payer: Medicaid Other | Admitting: Speech Pathology

## 2016-01-01 DIAGNOSIS — R1310 Dysphagia, unspecified: Secondary | ICD-10-CM

## 2016-01-01 LAB — GLUCOSE, CAPILLARY
GLUCOSE-CAPILLARY: 202 mg/dL — AB (ref 65–99)
GLUCOSE-CAPILLARY: 241 mg/dL — AB (ref 65–99)
GLUCOSE-CAPILLARY: 174 mg/dL — AB (ref 65–99)
Glucose-Capillary: 303 mg/dL — ABNORMAL HIGH (ref 65–99)
Glucose-Capillary: 178 mg/dL — ABNORMAL HIGH (ref 65–99)

## 2016-01-01 LAB — BASIC METABOLIC PANEL
ANION GAP: 11 (ref 5–15)
BUN: 12 mg/dL (ref 6–20)
CHLORIDE: 106 mmol/L (ref 101–111)
CO2: 28 mmol/L (ref 22–32)
Calcium: 9.4 mg/dL (ref 8.9–10.3)
Creatinine, Ser: 1.02 mg/dL (ref 0.61–1.24)
GFR calc non Af Amer: >60 mL/min (ref >60)
Glucose, Bld: 232 mg/dL — ABNORMAL HIGH (ref 65–99)
Potassium: 3.6 mmol/L (ref 3.5–5.1)
Sodium: 145 mmol/L (ref 135–145)

## 2016-01-01 MED ORDER — PRO-STAT SUGAR FREE PO LIQD
30.0000 mL | Freq: Three times a day (TID) | ORAL | Status: DC
  Administered 2016-01-02: 30 mL
  Filled 2016-01-01: qty 30

## 2016-01-01 MED ORDER — GLUCERNA 1.2 CAL PO LIQD
1000.0000 mL | ORAL | Status: DC
  Administered 2016-01-01: 1000 mL
  Filled 2016-01-01 (×4): qty 1000

## 2016-01-01 NOTE — Progress Notes (Signed)
Recreational Therapy Assessment and Plan  Patient Details  Name: William [name] MRN: [id number] Date of Birth: [birth year] Today's Date: 01/01/2016 Assessment Clinical Impression:  Problem List:  Patient Active Problem List   Diagnosis Date Noted  . Central cord syndrome ([place]) 12/27/2015  . Uncontrolled type 2 diabetes mellitus with complication, with long-term current use of insulin ([place])   . Chronic hepatitis C without hepatic coma ([place])   . H/O cervical spine surgery   . Tachycardia   . Tachypnea   . Hypokalemia   . Encounter for nasogastric (NG) tube placement   . Neck abscess   . Brodie's abscess of cervical spine ([place]) 12/20/2015  . Abscess, neck   . Endotracheal tube present   . Post-operative wound abscess   . Respiratory failure with hypoxia ([place])   . Cervical spinal stenosis 12/05/2015  . AA (alcohol abuse) 10/11/2015  . Esophageal reflux   . Alcohol abuse   . DKA (diabetic ketoacidoses) ([place]) 08/09/2015  . Chest pain 08/09/2015  . Hyponatremia 08/09/2015  . Essential hypertension 08/09/2015  . Migraine with aura 08/09/2015  . Insomnia 08/09/2015  . Chronic pain 08/09/2015  . Diabetes mellitus ([place]) 08/09/2015  . Diabetic ketoacidosis without coma associated with type 2 diabetes mellitus ([place])   . Weakness of left lower extremity   . Cervical radiculopathy due to diabetes mellitus ([place]) 08/03/2015  . Left upper extremity numbness 08/03/2015  . Right upper extremity numbness 08/03/2015  . Uncontrolled type 2 diabetes mellitus with background retinopathy ([place]) 07/19/2015  . Degenerative arthritis of thumb 07/10/2015  . Chronic radicular cervical pain 06/08/2015  . Degeneration of lumbar/lumbosacral disc without myelopathy 06/08/2015  . Degeneration of intervertebral disc of lumbosacral region 06/08/2015  . Metabolic syndrome 29/57/4734  . Knee pain  07/12/2014  . Balanitis 07/12/2014  . History of cocaine use 07/12/2014  . Depression 07/12/2014  . Chronic pain of multiple joints 07/12/2014  . Hx-TIA (transient ischemic attack) 07/12/2014  . Hx of migraine headaches 07/12/2014  . Obesity, Class I, BMI 30.0-34.9 (see actual BMI) 07/12/2014  . Cocaine abuse 07/12/2014  . Major depressive disorder with single episode ([place]) 07/12/2014  . Adiposity 07/12/2014  . Ache in joint 07/12/2014  . H/O disease 07/12/2014  . Personal history of transient ischemic attack (TIA), and cerebral infarction without residual deficits 07/12/2014  . Chronic hepatitis C ([place]) 07/11/2014  . Hyperlipidemia LDL goal <100 07/11/2014  . Chronic hepatitis C virus infection ([place]) 07/11/2014  . HLD (hyperlipidemia) 07/11/2014  . Uncontrolled type 2 diabetes mellitus with peripheral circulatory disorder ([place]) 12/02/2013  . Hypertension, goal below 140/90 12/02/2013  . Essential (primary) hypertension 12/02/2013  . Type 2 diabetes mellitus with peripheral angiopathy ([place]) 12/02/2013    Past Medical History:  Past Medical History  Diagnosis Date  . Hypertension   . Diabetes mellitus without complication ([place])   . Chronic hepatitis C without hepatic coma ([place])   . History of cocaine use   . Depression   . Arthritis   . History of TIA (transient ischemic attack)   . Hyperlipidemia   . History of migraine headaches   . Obesity, Class II, BMI 35-39.9   . BPH with obstruction/lower urinary tract symptoms   . Balanitis   . Urinary retention   . Tobacco abuse    Past Surgical History:  Past Surgical History  Procedure Laterality Date  . Rotator cuff surgery       left shoulder   . Spider bite  on neck  . Anterior cervical decomp/discectomy fusion N/A 12/05/2015    Procedure: Anterior Cervical Decompression Fusion  Cervical three-four, Cervical four-five, Cervical five-six, Cervical six-seven; Surgeon: Earnie Larsson, MD; Location: [facility]; Service: Neurosurgery; Laterality: N/A;  . Anterior cervical decompression for epidural abscess N/A 12/20/2015    Procedure: ANTERIOR CERVICAL DECOMPRESSION FOR EPIDURAL ABSCESS; Surgeon: Leeroy Cha, MD; Location: [facility]; Service: Neurosurgery; Laterality: N/A; ANTERIOR CERVICAL DECOMPRESSION FOR EPIDURAL ABSCESS    Assessment & Plan Clinical Impression: Patient is a 60 y.o. male with history of HTN, DMT2, Hep C, cervical stenosis with myelopathy s/p ACDF C3-C7 12/05/15 with post post op near complete loss of shoulder abduction and marked bilateral biceps weakness but improvement in overall neurologic deficits. He was admitted on 12/20/15 with neck swelling, inability to swallow, fever and erythema of neck/chest. CT neck done revealing large abscess 14 cm X 6 cm from skin surface to hardware with edema causing severe airway compromise. He was taken to OR for exploration of wound with drainage of abscess and irrigation of esophagus by Dr. Joya Salm on the same day. He remained intubated thorough 3/19 and respiratory status has been stable post extubation. ID was consulted for input on antibiotic regimen and recommended continuing Vancomycin/Ceftriaxone till April 27th with twice weekly BMP and Vanc trough. Patient refusing PICC and IV antibiotics after discharge and concerns regarding relapse discussed by Dr. Linus Salmons. He has had problems managing secretions and MBS done revealing posterior pharyngeal edema leading to poor laryngeal closure with Penetration/aspiration and NPO recommended. He has been reporting fullness in his throat for past 24 hours but no SOB. NGT placed. Patient continues to have BUE weakness affecting ability to carry out ADL tasks. CIR recommended for follow up therapy. Patient transferred to CIR on 12/27/2015 .      Met with pt and  fiance to discuss the importance of staying active and engaged in leisure activities post discharge.  Discussion included activity analysis & stress management/relaxation techniques.  Pt and fiance stated understanding.  No further TR implemented as pt expected to discharge at end of the week.   Leisure History/Participation Other Leisure Interests: Television Leisure Participation Style: With Family/Friends Psychosocial / Spiritual Social interaction - Mood/Behavior: Cooperative  The above assessment, treatment plan, treatment alternatives and goals were discussed and mutually agreed upon: by patient  [place] 01/01/2016, 4:13 PM

## 2016-01-01 NOTE — Progress Notes (Signed)
Speech Language Pathology Daily Session Note  Patient Details  Name: William [name] MRN: [id number] Date of Birth: [birth year]  Today's Date: 01/01/2016 SLP Individual Time: 1330-1400 SLP Individual Time Calculation (min): 30 min  Short Term Goals: Week 1: SLP Short Term Goal 1 (Week 1): Pt to tolerate trials of ice chips without overt s/s aspiration. SLP Short Term Goal 2 (Week 1): Pt to demonstrate awareness of dysphagia/fatigue for assessing readiness for ice chip protocol. SLP Short Term Goal 3 (Week 1): Pt to verbalize swallowing limitations and precautions at mod I.  Skilled Therapeutic Interventions: Skilled treatment session focused on dysphagia goals. SLP facilitated session by providing trials of thin liquids via straw. Patient consumed trials with minimal, subtle throat clearing and was Mod I for use of swallowing compensatory strategies and consistently utilized 2-3 swallows with each sip. Recommend repeat MBS tomorrow. Patient's fiance present and educated in regards to patient's current swallowing function and goals of skilled SLP intervention. Both verbalized understanding. Patient left upright in chair with all needs within reach. Continue with current plan of care.    Function:  Eating Eating   Modified Consistency Diet: Yes Eating Assist Level: Supervision or verbal cues (with trials from SLP)           Cognition Comprehension Comprehension assist level: Follows complex conversation/direction with extra time/assistive device;Follows complex conversation/direction with no assist  Expression   Expression assist level: Expresses complex ideas: With extra time/assistive device;Expresses complex ideas: With no assist  Social Interaction Social Interaction assist level: Interacts appropriately with others with medication or extra time (anti-anxiety, antidepressant).  Problem Solving Problem solving assist level: Solves complex problems: With extra time  Memory Memory assist  level: Recognizes or recalls 90% of the time/requires cueing < 10% of the time    Pain Pain Assessment Pain Assessment: No/denies pain  Therapy/Group: Individual Therapy  William [name] 01/01/2016, 2:04 PM

## 2016-01-01 NOTE — Progress Notes (Signed)
Physical Therapy Discharge Summary  Patient Details  Name: William [name] MRN: [id number] Date of Birth: [birth year]  Patient has met 8 of 8 long term goals due to improved activity tolerance, improved balance and ability to compensate for deficits.  Patient to discharge at an ambulatory level Independent to modified independent without use of AD at this time.  Reasons goals not met: n/a - all goals met at this time  Recommendation:  At this time, no follow up recommendation for PT services. Pt is independent to modified independent for mobility without assistive device. Pt continuing with OT and ST services while on CIR. Pt has been issued HEP and clearance for ambulation independently in hallways and room.   Equipment: No equipment provided  Reasons for discharge: treatment goals met  Patient/family agrees with progress made and goals achieved: Yes  PT Discharge Precautions/Restrictions Precautions Precautions: Cervical Required Braces or Orthoses: Cervical Brace Cervical Brace: Soft collar Restrictions Weight Bearing Restrictions: No    Cognition Overall Cognitive Status: Within Functional Limits for tasks assessed Sensation Sensation Light Touch: Appears Intact Coordination Gross Motor Movements are Fluid and Coordinated: No (not in BUE; BLE yes) Motor  Motor Motor:  (BUE paraparesis)     Trunk/Postural Assessment  Cervical Assessment Cervical Assessment: Exceptions to [facility] (soft collar with cervical precautions) Thoracic Assessment Thoracic Assessment: Within Functional Limits Lumbar Assessment Lumbar Assessment: Within Functional Limits  Balance Balance Balance Assessed: Yes Standardized Balance Assessment Standardized Balance Assessment: Berg Balance Test Berg Balance Test Sit to Stand: Able to stand without using hands and stabilize independently Standing Unsupported: Able to stand safely 2 minutes Sitting with Back Unsupported but Feet Supported on Floor or  Stool: Able to sit safely and securely 2 minutes Stand to Sit: Sits safely with minimal use of hands Transfers: Able to transfer safely, minor use of hands Standing Unsupported with Eyes Closed: Able to stand 10 seconds safely Standing Ubsupported with Feet Together: Able to place feet together independently and stand 1 minute safely From Standing, Reach Forward with Outstretched Arm: Can reach confidently >25 cm (10") (excursion full at trunk; UE limited due to weakness) From Standing Position, Pick up Object from Floor: Able to pick up shoe safely and easily From Standing Position, Turn to Look Behind Over each Shoulder: Looks behind from both sides and weight shifts well Turn 360 Degrees: Able to turn 360 degrees safely in 4 seconds or less Standing Unsupported, Alternately Place Feet on Step/Stool: Able to stand independently and safely and complete 8 steps in 20 seconds Standing Unsupported, One Foot in Front: Able to place foot tandem independently and hold 30 seconds Standing on One Leg: Able to lift leg independently and hold > 10 seconds Total Score: 56 Static Sitting Balance Static Sitting - Level of Assistance: 7: Independent Dynamic Sitting Balance Dynamic Sitting - Level of Assistance: 7: Independent Static Standing Balance Static Standing - Level of Assistance: 7: Independent Dynamic Standing Balance Dynamic Standing - Level of Assistance: 6: Modified independent (Device/Increase time);7: Independent Extremity Assessment   BUE impaired at elbows and shoulders - using compensatory strategies. Pt able to use hands/fingers WFL.   RLE Assessment RLE Assessment: Within Functional Limits LLE Assessment LLE Assessment: Within Functional Limits   See Function Navigator for Current Functional Status.  [name], PT, DPT  01/01/2016, 2:17 PM

## 2016-01-01 NOTE — Progress Notes (Signed)
Nutrition Follow-up  DOCUMENTATION CODES:   Obesity unspecified  INTERVENTION:  Decrease Glucerna 1.2 formula via Cortrak NGT to new goal rate of 70 ml/hr x 20 hours (may hold TF for up to 4 hours for therapies) with 30 ml Prostat TID to provide 1980 kcal, 129 grams of protein, and 1134 ml of free water.   Continue free water flushes of 200 ml QID per tube. Total free water: 1934 ml.   RD to continue to monitor.   NUTRITION DIAGNOSIS:   Inadequate oral intake related to inability to eat as evidenced by NPO status; ongoing  GOAL:   Patient will meet greater than or equal to 90% of their needs; met  MONITOR:   TF tolerance, Skin, Weight trends, Labs, I & O's  REASON FOR ASSESSMENT:   Consult Enteral/tube feeding initiation and management  ASSESSMENT:   60 y.o. male with history of HTN, DMT2, Hep C, cervical stenosis with myelopathy s/p ACDF C3-C7 12/05/15 with post post op near complete loss of shoulder abduction and marked bilateral biceps weakness but improvement in overall neurologic deficits. He was admitted on 12/20/15 with neck swelling, inability to swallow, fever and erythema of neck/chest.  He was taken to OR for exploration of wound with drainage of abscess and irrigation of esophagus MBS done revealing posterior pharyngeal edema leading to poor laryngeal closure with Penetration/aspiration and NPO recommended. He has been reporting fullness in his throat for past 24 hours but no SOB. NGT placed  Pt reports his stomach has been feeling very full from the TF formula and free water. He reports gagging last night and thus RN has to turn off his tube feeding. RD to re-adjust tube feeding to decrease amount of formula. Plans for MBS tomorrow. Pt is hopeful his NGT can be taken out. RD to continue to monitor.   Pt with no observed significant fat or muscle mass loss.   Labs and medications reviewed.   Diet Order:  Diet NPO time specified Except for: Ice Chips  Skin:    (Incision on neck)  Last BM:  3/27  Height:   Ht Readings from Last 1 Encounters:  12/20/15 '5\' 10"'  (1.778 m)    Weight:   Wt Readings from Last 1 Encounters:  01/01/16 217 lb 6 oz (98.6 kg)    Ideal Body Weight:  75.45 kg  BMI:  Body mass index is 31.19 kg/(m^2).  Estimated Nutritional Needs:   Kcal:  2000-2200  Protein:  115-130 grams  Fluid:  2- 2.2 L/day  EDUCATION NEEDS:   No education needs identified at this time  Corrin Parker, MS, RD, LDN Pager # [phone] After hours/ weekend pager # [phone]

## 2016-01-01 NOTE — Progress Notes (Signed)
[place] PHYSICAL MEDICINE & REHABILITATION     PROGRESS NOTE    Subjective/Complaints: In better spirits today. Happy that he's on "water protocol". Pain controlled. Gagged on TF last night---RN held  ROS: Pt denies fever, rash/itching, headache, blurred or double vision, nausea, vomiting, abdominal pain, diarrhea, chest pain, shortness of breath, palpitations, dysuria, dizziness, back pain, bleeding, anxiety, or depression   Objective: Vital Signs: Blood pressure 121/92, pulse 103, temperature 98.6 F (37 C), temperature source Oral, resp. rate 17, weight 98.6 kg (217 lb 6 oz), SpO2 96 %. No results found. No results for input(s): WBC, HGB, HCT, PLT in the last 72 hours.  Recent Labs  01/01/16 0516  NA 145  K 3.6  CL 106  GLUCOSE 232*  BUN 12  CREATININE 1.02  CALCIUM 9.4   CBG (last 3)   Recent Labs  12/31/15 2334 01/01/16 0409 01/01/16 0751  GLUCAP 164* 202* 241*    Wt Readings from Last 3 Encounters:  01/01/16 98.6 kg (217 lb 6 oz)  12/24/15 105.5 kg (232 lb 9.4 oz)  12/05/15 108.727 kg (239 lb 11.2 oz)    Physical Exam:  HEENT NGT. Staples in neck, incision cdi General: No acute distress Mood and affect are appropriate Heart: Regular rate and rhythm no rubs murmurs or rubs Lungs: Clear to auscultation, normal effort.  no rales or wheezes Abdomen: Positive bowel sounds, soft nontender to palpation, nondistended Extremities: No clubbing, cyanosis, or edema Skin: No evidence of breakdown, no evidence of rash Neurologic: Cranial nerves II through XII intact, motor strength is 1+ to 2-/5 in bilateral deltoid, 3+ tricep, 4 grip,4+ hip flexor, knee extensors, ankle dorsiflexor and plantar flexor Sensory exam normal sensation to light touch in bilateral upper and lower extremities Cerebellar exam normal finger to nose to finger as well as heel to shin in bilateral upper and lower extremities Musculoskeletal: Full range of motion in all 4 extremities. No  joint swelling, Mild dorsum of the hand swelling/edema, nontender no erythema Deep tendon reflexes are hyporeflexic bilateral upper and lower limbs Cognitively he's appropriate Psych: pleasant and appropriate    Assessment/Plan: 1. Weakness and mobility/functional deficits secondary to cervical myelopathy/C5 radiculopathy which require 3+ hours per day of interdisciplinary therapy in a comprehensive inpatient rehab setting. Physiatrist is providing close team supervision and 24 hour management of active medical problems listed below. Physiatrist and rehab team continue to assess barriers to discharge/monitor patient progress toward functional and medical goals.  Function:  Bathing Bathing position   Position: Shower  Bathing parts Body parts bathed by patient: Right arm, Left arm, Chest, Abdomen, Front perineal area, Right upper leg, Left upper leg, Right lower leg, Left lower leg, Buttocks, Back Body parts bathed by helper: Back, Buttocks  Bathing assist Assist Level: More than reasonable time      Upper Body Dressing/Undressing Upper body dressing   What is the patient wearing?: Hospital gown     Pull over shirt/dress - Perfomed by patient: Thread/unthread right sleeve, Thread/unthread left sleeve Pull over shirt/dress - Perfomed by helper: Put head through opening, Pull shirt over trunk        Upper body assist Assist Level:  (Max assist)      Lower Body Dressing/Undressing Lower body dressing   What is the patient wearing?: Underwear, Pants, Non-skid slipper socks Underwear - Performed by patient: Thread/unthread right underwear leg, Pull underwear up/down, Thread/unthread left underwear leg Underwear - Performed by helper: Pull underwear up/down Pants- Performed by patient: Thread/unthread right pants  leg, Thread/unthread left pants leg, Pull pants up/down Pants- Performed by helper: Pull pants up/down Non-skid slipper socks- Performed by patient: Don/doff right sock,  Don/doff left sock                    Lower body assist Assist for lower body dressing: Supervision or verbal cues      Toileting Toileting   Toileting steps completed by patient: Adjust clothing prior to toileting, Performs perineal hygiene, Adjust clothing after toileting Toileting steps completed by helper: Adjust clothing after toileting Toileting Assistive Devices: Grab bar or rail  Toileting assist Assist level: More than reasonable time   Transfers Chair/bed transfer   Chair/bed transfer method: Ambulatory Chair/bed transfer assist level: Supervision or verbal cues Chair/bed transfer assistive device: Patent attorneyWalker     Locomotion Ambulation     Max distance: 60 Assist level: No help, No cues, assistive device, takes more than a reasonable amount of time   Wheelchair          Cognition Comprehension Comprehension assist level: Follows complex conversation/direction with extra time/assistive device, Follows complex conversation/direction with no assist  Expression Expression assist level: Expresses complex ideas: With extra time/assistive device, Expresses complex ideas: With no assist  Social Interaction Social Interaction assist level: Interacts appropriately with others with medication or extra time (anti-anxiety, antidepressant).  Problem Solving Problem solving assist level: Solves complex problems: With extra time  Memory Memory assist level: Recognizes or recalls 90% of the time/requires cueing < 10% of the time  Medical Problem List and Plan: 1. Cervical myelopathy and bilateral C5 radiculopathy secondary to Cervical spinal stenosis and postoperative abscess  -team conference today 2. DVT Prophylaxis/Anticoagulation: Pharmaceutical: Heparin indicated 3. Pain Management:  oxycodone 1 mg per mL, 5-10 mL every 4 hours when necessary 4. Mood: Overt signs of depression, will monitor for adjustment disorder, neuropsychologist evaluation as necessary 5. Neuropsych:  This patient is capable of making decisions on his own behalf. 6. Skin/Wound Care: monitor wound for healing 7. Fluids/Electrolytes/Nutrition:   -continue NGT feedings, hold prn, elevating hob 8. HTN: Monitor BP bid.  9. DMT2: Monitor BS ac/hs. Use SSI   - Increased lantus  to 35 u qhs and added 10u qam with improvement 10. Neck abscess: To continue Vanc  and Ceftriaxone 2 grams daily till April 27th-He will need to go home on IV antibiotics and a more permanent IV access line would need to be placed 11. Dysphagia with pharyngeal edema--continue NGT    -clinically showing signs of improvement  -MBS tomorrow?  -water protocol     LOS (Days) 5 A FACE TO FACE EVALUATION WAS PERFORMED  Guilherme Schwenke T 01/01/2016 8:46 AM

## 2016-01-01 NOTE — Progress Notes (Signed)
Patient ID: William [name], male   DOB: January 16, 1956, 60 y.o.   MRN: [id number] Doing welll. For ba swallow in am . Wound well healed

## 2016-01-01 NOTE — Progress Notes (Signed)
Physical Therapy Session Note  Patient Details  Name: William [name] MRN: [id number] Date of Birth: [birth year]  Today's Date: 01/01/2016 PT Individual Time: 0800-0900 PT Individual Time Calculation (min): 60 min   Short Term Goals: Week 1:  PT Short Term Goal 1 (Week 1): =LTG due to estimated LOS  Skilled Therapeutic Interventions/Progress Updates:    Discussed PT goals and plan for discharge from PT services as pt has met all goals and is functioning at independent to modified independent for all mobility. Pt in agreement and happy to hear his sessions will be focus on ST and OT. Discussed this PA and CSW as well as rest of treatment team. Pt requesting water so engaged in water protocol with set-up assistance for suction and opening mouthwash packet. Pt able to recall cues for small sips with straw with 1 episode of cough after taking too big of a sip.   Performed grad day activities including gait on unit and in home environment, simulated car transfer, stair negotiation for community mobility, performed floor transfer and educated on what to do in case of a fall (performed with supervision due to assist with line management from getting on/off floor otherwise mod I), readministered Berg Balance test, and pt made mod I in room and in hallway for mobility. Pt denies any concerns. See PT d/c summary for more details.  Therapy Documentation Precautions:  Precautions Precautions: Cervical Precaution Comments: Pt is wearing no brace in bed and applies sitting up in bed Required Braces or Orthoses: Cervical Brace Cervical Brace: Soft collar Restrictions Weight Bearing Restrictions: No  Pain:  Denies pain. Repots he had a rough night with tube feeds.  See Function Navigator for Current Functional Status.   Therapy/Group: Individual Therapy  [name], PT, DPT  01/01/2016, 9:33 AM

## 2016-01-01 NOTE — Progress Notes (Addendum)
Occupational Therapy Session Note  Patient Details  Name: William [name] MRN: [id number] Date of Birth: 1956/01/14  Today's Date: 01/01/2016 OT Individual Time: 1000-1100 and 1300-1330 OT Individual Time Calculation (min): 60 min and 30 min   Short Term Goals: Week 1:  OT Short Term Goal 1 (Week 1): STG = LTGs due to ELOS  Skilled Therapeutic Interventions/Progress Updates:    Session One: Pt seen for OT session focusing on self feeding and establishment of HEP/ UE ROM exercises. Pt sitting up in recliner upon arrival, agreeable to tx session. He ambulated throughout room, managing IV pole mod I. Pt voicing desire for water as pt on water protocol. He completed oral care at sink with supervision, with cues not to swallow water. He was provided with long straw for drinking as pt with limited UE ROM needed to bring cup to mouth. Pt voiced pleasure with long straw and demonstrated safely being able to drink from it with no S/S of aspiration.   He sat at high/low table and participated in simulated self feeding task. Initially, pt required to bend down towards table to get fork to mouth due to limited elbow and shoulder ROM. Placed towels under pt's elbow, and from gravity eliminated position pt able to bend elbow and self feed from upright position. Pt very excited with this education of proper set-up. He demonstrated ability to manipulate silverware and able to scoop applesauce with spoon and stab peaches with a fork. He then completed towel push exercises as part of HEP. Pt able to recall exercises taught in previous session. With pt, wrote down exercises with pt describing exercises so he could recall exercises at d/c. Educated on importance of taking rest breaks throughout exercises, quality vs. Quantity with exercises, and scapular mobilization for increased UE ROM.    Discussed with pt d/c planning and all questions answered.   Session Two: Pt seen for OT session focusing on cont with  establishment of HEP and UE strengthening/ ROM. Pt sitting up in recliner upon arrival, agreeable to tx session. He ambulated throughout unit Mod I managing IV pole. He completed pendulum exercises in standing including clockwise, counter clockwise, to the R/ L and anterior/ posterior. Completed x10 reps on B sides. Rest breaks provided btwn sets. Had pt write out directions for exercises, pt demonstrated good fine motor ability, able to write out directions legibally and within a reasonable amount of time.  Pt returned to room at end of session and completed oral care at sink in prep for SLP session, bending over on sink ledge for UE support when brushing. Pt left in recliner with all needs in reach.   Therapy Documentation Precautions:  Precautions Precautions: Cervical Precaution Comments: Pt is wearing no brace in bed and applies sitting up in bed Required Braces or Orthoses: Cervical Brace Cervical Brace: Soft collar, At all times Restrictions Weight Bearing Restrictions: No Pain:   No/ denies pain  See Function Navigator for Current Functional Status.   Therapy/Group: Individual Therapy  Brocker, Ellanor Feuerstein C 01/01/2016, 7:10 AM

## 2016-01-01 NOTE — Progress Notes (Signed)
Physical Therapy Session Note  Patient Details  Name: William [name] MRN: [id number] Date of Birth: [birth year]  Today's Date: 01/01/2016 PT Individual Time: 0918-1000 PT Individual Time Calculation (min): 42 min   Short Term Goals: Week 1:  PT Short Term Goal 1 (Week 1): =LTG due to estimated LOS  Skilled Therapeutic Interventions/Progress Updates:    Pt received in bathroom, pt able to complete all toileting tasks and ambulating throughout room pushing IV pole with Mod I. Pt performed static standing activities to challenge balance; exercises included single leg stance with eyes open and single leg stance with eyes closed, without BUE support and alternating on BLE. When experiencing LOB pt able to recover well without assistance from PT. Pt then ambulated from room>outside and pushing IV pole; pt became unsteady pushing IV pole over uneven surfaces so PT began pushing IV pole. Pt able to ambulate over even/uneven surfaces and over ramps with Mod I with PT managing IV pole. At end of session pt returned to room & left in recliner with all needs within reach.   Therapy Documentation Precautions:  Precautions Precautions: Cervical Precaution Comments: Pt is wearing no brace in bed and applies sitting up in bed Required Braces or Orthoses: Cervical Brace Cervical Brace: Soft collar Restrictions Weight Bearing Restrictions: No Pain: Pain Assessment Pain Assessment: No/denies pain   See Function Navigator for Current Functional Status.   Therapy/Group: Individual Therapy  Sandi MariscalVictoria M Pacen Watford 01/01/2016, 10:49 AM

## 2016-01-01 NOTE — Plan of Care (Signed)
Pt's plan of care adjusted to 3hrs per day after speaking with care team and discussed with MD in team conference as pt currently with only SLP and OT needs. PT has signed off at this time.

## 2016-01-02 ENCOUNTER — Inpatient Hospital Stay (HOSPITAL_COMMUNITY): Payer: Medicaid Other

## 2016-01-02 ENCOUNTER — Inpatient Hospital Stay (HOSPITAL_COMMUNITY): Payer: Medicaid Other | Admitting: Speech Pathology

## 2016-01-02 ENCOUNTER — Inpatient Hospital Stay (HOSPITAL_COMMUNITY): Payer: Medicaid Other | Admitting: Occupational Therapy

## 2016-01-02 LAB — GLUCOSE, CAPILLARY
GLUCOSE-CAPILLARY: 207 mg/dL — AB (ref 65–99)
GLUCOSE-CAPILLARY: 227 mg/dL — AB (ref 65–99)
Glucose-Capillary: 227 mg/dL — ABNORMAL HIGH (ref 65–99)
Glucose-Capillary: 295 mg/dL — ABNORMAL HIGH (ref 65–99)
Glucose-Capillary: 296 mg/dL — ABNORMAL HIGH (ref 65–99)
Glucose-Capillary: 201 mg/dL — ABNORMAL HIGH (ref 65–99)

## 2016-01-02 LAB — CBC WITH DIFFERENTIAL/PLATELET
BASOS PCT: 1 %
Basophils Absolute: 0 10*3/uL (ref 0.0–0.1)
EOS ABS: 0.3 10*3/uL (ref 0.0–0.7)
EOS PCT: 5 %
HCT: 40.3 % (ref 39.0–52.0)
Hemoglobin: 13.1 g/dL (ref 13.0–17.0)
LYMPHS ABS: 2.9 10*3/uL (ref 0.7–4.0)
Lymphocytes Relative: 46 %
MCH: 29 pg (ref 26.0–34.0)
MCHC: 32.5 g/dL (ref 30.0–36.0)
MCV: 89.2 fL (ref 78.0–100.0)
MONOS PCT: 11 %
Monocytes Absolute: 0.7 10*3/uL (ref 0.1–1.0)
NEUTROS PCT: 37 %
Neutro Abs: 2.3 10*3/uL (ref 1.7–7.7)
PLATELETS: 368 10*3/uL (ref 150–400)
RBC: 4.52 MIL/uL (ref 4.22–5.81)
RDW: 14.9 % (ref 11.5–15.5)
WBC: 6.1 10*3/uL (ref 4.0–10.5)

## 2016-01-02 LAB — VANCOMYCIN, TROUGH: VANCOMYCIN TR: 19 ug/mL (ref 10.0–20.0)

## 2016-01-02 MED ORDER — RESOURCE THICKENUP CLEAR PO POWD
ORAL | Status: DC | PRN
  Filled 2016-01-02: qty 125

## 2016-01-02 MED ORDER — PRO-STAT SUGAR FREE PO LIQD
30.0000 mL | Freq: Three times a day (TID) | ORAL | Status: DC
  Administered 2016-01-02 – 2016-01-03 (×3): 30 mL via ORAL
  Filled 2016-01-02 (×3): qty 30

## 2016-01-02 MED ORDER — GABAPENTIN 250 MG/5ML PO SOLN
100.0000 mg | Freq: Three times a day (TID) | ORAL | Status: DC
  Administered 2016-01-02 – 2016-01-03 (×3): 100 mg via ORAL
  Filled 2016-01-02 (×9): qty 2

## 2016-01-02 MED ORDER — INSULIN ASPART 100 UNIT/ML ~~LOC~~ SOLN
0.0000 [IU] | Freq: Three times a day (TID) | SUBCUTANEOUS | Status: DC
  Administered 2016-01-02: 5 [IU] via SUBCUTANEOUS
  Administered 2016-01-02 (×2): 8 [IU] via SUBCUTANEOUS
  Administered 2016-01-03: 11 [IU] via SUBCUTANEOUS
  Administered 2016-01-03: 2 [IU] via SUBCUTANEOUS
  Administered 2016-01-03: 15 [IU] via SUBCUTANEOUS
  Administered 2016-01-04: 5 [IU] via SUBCUTANEOUS

## 2016-01-02 MED ORDER — ALUM & MAG HYDROXIDE-SIMETH 200-200-20 MG/5ML PO SUSP
30.0000 mL | ORAL | Status: DC | PRN
Start: 2016-01-02 — End: 2016-01-04
  Administered 2016-01-02 – 2016-01-03 (×3): 30 mL via ORAL
  Filled 2016-01-02 (×3): qty 30

## 2016-01-02 MED ORDER — TRAZODONE HCL 50 MG PO TABS
25.0000 mg | ORAL_TABLET | Freq: Every evening | ORAL | Status: DC | PRN
  Administered 2016-01-02: 50 mg via ORAL
  Filled 2016-01-02: qty 1

## 2016-01-02 MED ORDER — AMLODIPINE BESYLATE 10 MG PO TABS
10.0000 mg | ORAL_TABLET | Freq: Every day | ORAL | Status: DC
  Administered 2016-01-03: 10 mg via ORAL
  Filled 2016-01-02 (×2): qty 1

## 2016-01-02 MED ORDER — CALCIUM POLYCARBOPHIL 625 MG PO TABS
625.0000 mg | ORAL_TABLET | Freq: Two times a day (BID) | ORAL | Status: DC
  Administered 2016-01-02 – 2016-01-03 (×2): 625 mg via ORAL
  Filled 2016-01-02 (×5): qty 1

## 2016-01-02 MED ORDER — STARCH (THICKENING) PO POWD: ORAL | Status: DC | PRN

## 2016-01-02 MED ORDER — GUAIFENESIN-DM 100-10 MG/5ML PO SYRP: 5.0000 mL | ORAL_SOLUTION | Freq: Four times a day (QID) | ORAL | Status: DC | PRN

## 2016-01-02 MED ORDER — ACETAMINOPHEN 160 MG/5ML PO SOLN
325.0000 mg | ORAL | Status: DC | PRN
  Administered 2016-01-02: 650 mg via ORAL
  Filled 2016-01-02: qty 20.3

## 2016-01-02 MED ORDER — POTASSIUM CHLORIDE CRYS ER 20 MEQ PO TBCR
20.0000 meq | EXTENDED_RELEASE_TABLET | Freq: Two times a day (BID) | ORAL | Status: DC
  Administered 2016-01-02 – 2016-01-03 (×2): 20 meq via ORAL
  Filled 2016-01-02 (×4): qty 1

## 2016-01-02 NOTE — Progress Notes (Signed)
Occupational Therapy Note  Patient Details  Name: Jill SideWarren Seneca MRN: [id number] Date of Birth: 01-14-1956  Today's Date: 01/02/2016 OT Individual Time: 1430-1455 OT Individual Time Calculation (min): 25 min   1:1 Continued practice in donning crew neck t shirt. Pt was successful with min A sitting with bilateral UEs on elevated table top and shirt setup in proper orientation. Pt able to thread and thread over crown of head.  When he came into full upright sitting, shirt came down enough for him to pull it down all the way and then have therapist adjust over/ around collar.  Pt and family member education and discussed activity tolerance/ endurance with community outings and returning to activities in the community.   Discussed with PA about collar and showering and the option of ordering a shower collar however now the recommendation is pt can just wear the collar for comfort (it can be doffed for a shower)!  Roney MansSmith, Emmaleigh Longo [facility] 01/02/2016, 3:50 PM

## 2016-01-02 NOTE — Discharge Summary (Signed)
Physician Discharge Summary  Patient ID: William [name] MRN: [id number] DOB/AGE: Mar 08, 1956 60 y.o.  Admit date: 12/27/2015 Discharge date: 01/04/2016  Discharge Diagnoses:  Principal Problem:   Central cord syndrome [facility]) Active Problems:   Cervical radiculopathy at C5   Dysphagia   Discharged Condition: Stable  Significant Diagnostic Studies:   Dg Abd Portable 1v  12/27/2015  CLINICAL DATA:  Feeding tube placement EXAM: PORTABLE ABDOMEN - 1 VIEW COMPARISON:  December 23, 2015 FINDINGS: Feeding tube tip is in the body of the stomach. There is contrast in the colon. Bowel gas pattern is normal. No obstruction or free air. IMPRESSION: Feeding tube tip in body of stomach. Bowel gas pattern unremarkable. Electronically Signed   By: Bretta Bang III M.D.   On: 12/27/2015 14:07   D Labs:  Basic Metabolic Panel:  Recent Labs Lab 01/01/16 0516 01/04/16 0540  NA 145 143  K 3.6 4.3  CL 106 106  CO2 28 28  GLUCOSE 232* 173*  BUN 12 10  CREATININE 1.02 0.96  CALCIUM 9.4 9.6    CBC:  Recent Labs Lab 01/02/16 0419  WBC 6.1  NEUTROABS 2.3  HGB 13.1  HCT 40.3  MCV 89.2  PLT 368    CBG:  Recent Labs Lab 01/03/16 0703 01/03/16 1129 01/03/16 1643 01/03/16 2057 01/04/16 0704  GLUCAP 144* 326* 369* 100* 234*    Brief HPI:   William [name] is a 60 y.o. male with history of HTN, DMT2, Hep C, cervical stenosis with myelopathy s/p ACDF C3-C7 12/05/15 with post post op near complete loss of shoulder abduction and marked bilateral biceps weakness but improvement in overall neurologic deficits. He was admitted on 12/20/15 with neck swelling, inability to swallow, fever and erythema of neck/chest. CT neck showed large abscess 14 cm X 6 cm with edema causing severe airway compromise. He was taken to OR emergently for exploration of wound with drainage of abscess and irrigation of esophagus by Dr. [name]. ID recommended continuing Vancomycin/Ceftriaxone till April 27th. Patient has  had problems managing secretions with severe dysphagia due to edema and remains NPO with tube feeds for nutritional support. He continues to have issues with balance and mobility. CIR was recommended for follow up therapy.     Physical Exam at discharge:  HEENT NGT. Staples in neck, incision cdi General: No acute distress Mood and affect are appropriate Heart: Regular rate and rhythm no rubs murmurs or rubs Lungs: Clear to auscultation, normal effort. no rales or wheezes Abdomen: Positive bowel sounds, soft nontender to palpation, nondistended Extremities: No clubbing, cyanosis, or edema Skin: No evidence of breakdown, no evidence of rash Neurologic: Cranial nerves II through XII intact, motor strength is 1+ to 2-/5 in bilateral deltoid, 3+ tricep, 4 grip,4+ hip flexor, knee extensors, ankle dorsiflexor and plantar flexor--stable Sensory exam normal sensation to light touch in bilateral upper and lower extremities Cerebellar exam normal finger to nose to finger as well as heel to shin in bilateral upper and lower extremities Musculoskeletal: Full range of motion in all 4 extremities. No joint swelling, Mild dorsum of the hand swelling/edema, nontender no erythema Deep tendon reflexes are hyporeflexic bilateral upper and lower limbs Cognitively he's appropriate Psych: pleasant and appropriate   Hospital Course: Sequoyah Counterman was admitted to rehab 12/27/2015 for inpatient therapies to consist of PT, ST and OT at least three hours five days a week. Past admission physiatrist, therapy team and rehab RN have worked together to provide customized collaborative inpatient rehab. He was kept  NPO with tube feeds ongoing till 03/29. Follow up labs showed stability in renal status and potassium levels have been stable. Potassium supplement were discontinued at discharge.  Follow up  CBC shows that ABLA has resolved with Hgb stable at 13.1.   He was advanced to dysphagia 2 diet with nectar liquids as well  as water protocol between meals.  Blood pressure has been well controlled on Norvasc. Pain has been well controlled on Neurontin tid with tylenol on prn basis.  Neck incision has been healing well without signs or symptoms of infection and staples were removed without difficulty on 03/28.   Diabetes has been monitored with frequent cbg checks due to tube feeds.   Po intake has been good on modified diet and lantus insulin was titrated to 35 units at bed time and 10 units in am. He is resume home meal coverage at discharge.  PICC line was placed on 03/24 and he is to continue IV Vancomycin and IV ceftriaxone till April 27th.  He has been educated on energy conservation measures as well as HEP.  He has made good progress and is modified independent in supervised setting.   During patient's stay in rehab weekly team conferences were held to monitor patient's progress, set goals and discuss barriers to discharge. At admission, patient required supervision with mobility and max assist with basic self care tasks.  He has had improvement in activity tolerance, balance, postural control, as well as ability to compensate for deficits. He has had improvement in functional use BUE and BLE, improvement in coordination and improvement in awareness He requires set up assist for meals and for ADL tasks. He is able to complete ADLs at modified independent to supervision level. He is modified independent for ambulating without AD and balance has improved with BERG score of 56/56. Physical therapy signed off by 03/28.  He is tolerating dysphagia 2 diet with nectar liquids with minimal overt signs of aspiration. He is able to follow compensatory strategies independently.  Family education was completed on assistance needed after discharge as well as antibiotic management. He will continue to receive follow up HHST, HHOT and HHRN by [facility] after discharge.    Disposition:  Home   Diet: Dysphagia 2, nectar liquids.    Special Instructions: 1. No lifting, driving or strenuous activity till cleared by MD.        Discharge Instructions    Ambulatory referral to Physical Medicine Rehab    Complete by:  As directed   2 week follow up/ Central cord syndrome/moderate complexity            Medication List    STOP taking these medications        cyclobenzaprine 10 MG tablet  Commonly known as:  FLEXERIL     diphenhydrAMINE 25 MG tablet  Commonly known as:  BENADRYL     fluconazole 150 MG tablet  Commonly known as:  DIFLUCAN     Insulin Glargine 100 UNIT/ML Solostar Pen  Commonly known as:  LANTUS SOLOSTAR  Replaced by:  insulin glargine 100 UNIT/ML injection     metoCLOPramide 10 MG tablet  Commonly known as:  REGLAN     oxyCODONE-acetaminophen 5-325 MG tablet  Commonly known as:  PERCOCET/ROXICET     sucralfate 1 GM/10ML suspension  Commonly known as:  CARAFATE      TAKE these medications        amLODipine 10 MG tablet  Commonly known as:  NORVASC  Take  1 tablet (10 mg total) by mouth daily.     cefTRIAXone 2 g in dextrose 5 % 50 mL  Inject 2 g into the vein daily.     freestyle lancets  Use as instructed to check blood glucose 4x/day     gabapentin 100 MG capsule  Commonly known as:  NEURONTIN  Take 1 capsule (100 mg total) by mouth 3 (three) times daily.     glucose blood test strip  Commonly known as:  ACCU-CHEK AVIVA  Use as instructed to check blood glucose 4x/day     insulin aspart 100 UNIT/ML FlexPen  Commonly known as:  NOVOLOG FLEXPEN  Inject 15 Units into the skin 3 (three) times daily with meals.     insulin glargine 100 UNIT/ML injection  Commonly known as:  LANTUS  Inject 0.35 mLs (35 Units total) into the skin at bedtime.     insulin glargine 100 UNIT/ML injection  Commonly known as:  LANTUS  Inject 0.1 mLs (10 Units total) into the skin daily.     Insulin Pen Needle 32G X 6 MM Misc  Commonly known as:  EASY TOUCH PEN NEEDLES  Use with Flexpen to  administer Insulin     BD PEN NEEDLE NANO U/F 32G X 4 MM Misc  Generic drug:  Insulin Pen Needle  USE WITH FLEXPEN TO ADMINISTER INSULIN     pantoprazole 40 MG tablet  Commonly known as:  PROTONIX  Take 1 tablet (40 mg total) by mouth 2 (two) times daily. Twice a day for 10 days; then start using it once a day     THUMB BRACE Misc  1 Device by Does not apply route daily as needed.     vancomycin 1,250 mg in sodium chloride 0.9 % 250 mL  Inject 1,250 mg into the vein every 12 (twelve) hours.       Follow-up Information    Follow up with Ranelle Oyster, MD.   Specialty:  Physical Medicine and Rehabilitation   Why:  office will call you with follow up appointment   Contact information:   [address], [address] [place] [address] [phone]       Follow up with Karn Cassis, MD. Call today.   Specialty:  Neurosurgery   Why:  for follow up appointment   Contact information:   [address] [place] [address] [phone]       Follow up with Acey Lav, MD On 01/28/2016.   Specialty:  Infectious Diseases   Why:  Be there at 2 pm  for follow up appointment   Contact information:   [address] [place] [address] [phone]       Follow up with Schuyler Amor, MD On 01/17/2016.   Specialty:  Family Medicine   Why:  @ 2:00 pm ("new patient" appointment)   Contact information:   [address] [place] [address] [phone]       Signed: Jacquelynn Cree 01/07/2016, 10:29 AM

## 2016-01-02 NOTE — Progress Notes (Signed)
[facility] PHYSICAL MEDICINE & REHABILITATION     PROGRESS NOTE    Subjective/Complaints: Sitting in chair. No complaints. Anxious for MBS today. Denies any other issues. Feels that his arms are getting stronger.   ROS: Pt denies fever, rash/itching, headache, blurred or double vision, nausea, vomiting, abdominal pain, diarrhea, chest pain, shortness of breath, palpitations, dysuria, dizziness, back pain, bleeding, anxiety, or depression   Objective: Vital Signs: Blood pressure 133/75, pulse 100, temperature 98.7 F (37.1 C), temperature source Oral, resp. rate 17, weight 98.56 kg (217 lb 4.6 oz), SpO2 95 %. No results found.  Recent Labs  01/02/16 0419  WBC 6.1  HGB 13.1  HCT 40.3  PLT 368    Recent Labs  01/01/16 0516  NA 145  K 3.6  CL 106  GLUCOSE 232*  BUN 12  CREATININE 1.02  CALCIUM 9.4   CBG (last 3)   Recent Labs  01/02/16 0044 01/02/16 0410 01/02/16 0749  GLUCAP 207* 227* 227*    Wt Readings from Last 3 Encounters:  01/02/16 98.56 kg (217 lb 4.6 oz)  12/24/15 105.5 kg (232 lb 9.4 oz)  12/05/15 108.727 kg (239 lb 11.2 oz)    Physical Exam:  HEENT NGT. Staples in neck, incision cdi General: No acute distress Mood and affect are appropriate Heart: Regular rate and rhythm no rubs murmurs or rubs Lungs: Clear to auscultation, normal effort.  no rales or wheezes Abdomen: Positive bowel sounds, soft nontender to palpation, nondistended Extremities: No clubbing, cyanosis, or edema Skin: No evidence of breakdown, no evidence of rash Neurologic: Cranial nerves II through XII intact, motor strength is 1+ to 2-/5 in bilateral deltoid, 3+ tricep, 4 grip,4+ hip flexor, knee extensors, ankle dorsiflexor and plantar flexor--stable Sensory exam normal sensation to light touch in bilateral upper and lower extremities Cerebellar exam normal finger to nose to finger as well as heel to shin in bilateral upper and lower extremities Musculoskeletal: Full range of  motion in all 4 extremities. No joint swelling, Mild dorsum of the hand swelling/edema, nontender no erythema Deep tendon reflexes are hyporeflexic bilateral upper and lower limbs Cognitively he's appropriate Psych: pleasant and appropriate    Assessment/Plan: 1. Weakness and mobility/functional deficits secondary to cervical myelopathy/C5 radiculopathy which require 3+ hours per day of interdisciplinary therapy in a comprehensive inpatient rehab setting. Physiatrist is providing close team supervision and 24 hour management of active medical problems listed below. Physiatrist and rehab team continue to assess barriers to discharge/monitor patient progress toward functional and medical goals.  Function:  Bathing Bathing position   Position: Shower  Bathing parts Body parts bathed by patient: Right arm, Left arm, Chest, Abdomen, Front perineal area, Right upper leg, Left upper leg, Right lower leg, Left lower leg, Buttocks, Back Body parts bathed by helper: Back, Buttocks  Bathing assist Assist Level: More than reasonable time      Upper Body Dressing/Undressing Upper body dressing   What is the patient wearing?: Hospital gown     Pull over shirt/dress - Perfomed by patient: Thread/unthread right sleeve, Thread/unthread left sleeve Pull over shirt/dress - Perfomed by helper: Put head through opening, Pull shirt over trunk        Upper body assist Assist Level:  (Max assist)      Lower Body Dressing/Undressing Lower body dressing   What is the patient wearing?: Underwear, Pants, Non-skid slipper socks Underwear - Performed by patient: Thread/unthread right underwear leg, Pull underwear up/down, Thread/unthread left underwear leg Underwear - Performed by helper:  Pull underwear up/down Pants- Performed by patient: Thread/unthread right pants leg, Thread/unthread left pants leg, Pull pants up/down Pants- Performed by helper: Pull pants up/down Non-skid slipper socks- Performed  by patient: Don/doff right sock, Don/doff left sock                    Lower body assist Assist for lower body dressing: Supervision or verbal cues      Toileting Toileting   Toileting steps completed by patient: Adjust clothing prior to toileting, Performs perineal hygiene, Adjust clothing after toileting Toileting steps completed by helper: Adjust clothing after toileting Toileting Assistive Devices: Grab bar or rail  Toileting assist Assist level: More than reasonable time   Transfers Chair/bed transfer   Chair/bed transfer method: Ambulatory Chair/bed transfer assist level: No help, no cues Chair/bed transfer assistive device: Patent attorney     Max distance: >300 ft Assist level: No help, No cues, assistive device, takes more than a reasonable amount of time   Wheelchair          Cognition Comprehension Comprehension assist level: Follows complex conversation/direction with extra time/assistive device, Follows complex conversation/direction with no assist  Expression Expression assist level: Expresses complex ideas: With extra time/assistive device, Expresses complex ideas: With no assist  Social Interaction Social Interaction assist level: Interacts appropriately with others with medication or extra time (anti-anxiety, antidepressant).  Problem Solving Problem solving assist level: Solves complex problems: With extra time  Memory Memory assist level: Recognizes or recalls 90% of the time/requires cueing < 10% of the time  Medical Problem List and Plan: 1. Cervical myelopathy and bilateral C5 radiculopathy secondary to Cervical spinal stenosis and postoperative abscess  -working toward Friday dc 2. DVT Prophylaxis/Anticoagulation: Pharmaceutical: Heparin indicated 3. Pain Management:  oxycodone 1 mg per mL, 5-10 mL every 4 hours when necessary 4. Mood: Overt signs of depression, will monitor for adjustment disorder, neuropsychologist evaluation  as necessary 5. Neuropsych: This patient is capable of making decisions on his own behalf. 6. Skin/Wound Care: monitor wound for healing 7. Fluids/Electrolytes/Nutrition:   -continue NGT feedings, hold prn, elevating hob 8. HTN: Monitor BP bid.  9. DMT2: Monitor BS ac/hs. Use SSI   - Increased lantus  to 35 u qhs and added 10u qam with improvement  -further adjustment once he starts on diet 10. Neck abscess: To continue Vanc  and Ceftriaxone 2 grams daily till April 27th-He will need to go home on IV antibiotics and a more permanent IV access line would need to be placed 11. Dysphagia with pharyngeal edema--continue NGT    -clinically showing signs of improvement  -MBS today  -water protocol     LOS (Days) 6 A FACE TO FACE EVALUATION WAS PERFORMED  Modean Mccullum T 01/02/2016 8:58 AM

## 2016-01-02 NOTE — Progress Notes (Signed)
Speech Language Pathology Daily Session Notes  Patient Details  Name: William [name] MRN: [id number] Date of Birth: Feb 20, 1956  Today's Date: 01/02/2016  Session 1: SLP Individual Time: 1130-1210 SLP Individual Time Calculation (min): 40 min   Session 2: SLP Individual Time: 1400-1430 SLP Individual Time Calculation (min): 30 min  Short Term Goals: Week 1: SLP Short Term Goal 1 (Week 1): Pt to tolerate trials of ice chips without overt s/s aspiration. SLP Short Term Goal 2 (Week 1): Pt to demonstrate awareness of dysphagia/fatigue for assessing readiness for ice chip protocol. SLP Short Term Goal 3 (Week 1): Pt to verbalize swallowing limitations and precautions at mod I. SLP Short Term Goal 4 (Week 1): Pt will demonstate tolerance of his Dys.2 textures and nectar-thick liquids via straw diet as evidenced by vitals and imaging remaining WFL.  Skilled Therapeutic Interventions:  Session 1: Skilled treatment session focused on dysphagia goals. SLP facilitated session by providing skilled observation with lunch meal of Dys. 2 textures with nectar-thick liquids. Patient required supervision verbal cues for use of multiple swallows with solid textures which led to one overt coughing episode throughout the meal. Recommend patient continue current diet. Patient left upright in chair with all needs within reach. Continue with current plan of care.   Session 2: Skilled treatment session focused on dysphagia goals. Patient performed oral care with Mod I and consumed trials of thin liquids via straw with intermittent subtle throat clearing. Recommend patient continue current diet of nectar-thick liquids with the water protocol. Patient left upright in chair with all needs within reach. Continue with current plan of care.   Function:  Eating Eating   Modified Consistency Diet: Yes Eating Assist Level: Supervision or verbal cues;Set up assist for           Cognition Comprehension Comprehension  assist level: Follows complex conversation/direction with extra time/assistive device;Follows complex conversation/direction with no assist  Expression   Expression assist level: Expresses complex ideas: With extra time/assistive device;Expresses complex ideas: With no assist  Social Interaction Social Interaction assist level: Interacts appropriately with others with medication or extra time (anti-anxiety, antidepressant).  Problem Solving Problem solving assist level: Solves complex problems: With extra time  Memory Memory assist level: Recognizes or recalls 90% of the time/requires cueing < 10% of the time    Pain No/Denies Pain   Therapy/Group: Individual Therapy  Erik Nessel 01/02/2016, 4:01 PM

## 2016-01-02 NOTE — Progress Notes (Signed)
Speech Language Pathology Note  Patient Details  Name: Jill SideWarren Ravan MRN: [id number] Date of Birth: January 23, 1956 Today's Date: 01/02/2016  MBSS complete. Full report located under chart review in imaging section.   Fae PippinMelissa Kienna Moncada, M.A., CCC-SLP [phone] Anas Reister 01/02/2016, 10:52 AM

## 2016-01-02 NOTE — Progress Notes (Signed)
Social Work Patient ID: William [name], male   DOB: October 12, 1955, 60 y.o.   MRN: [id number]   Met with pt and fiance yesterday afternoon following team conference.  Both aware and agreeable with targeted d/c date of 3/31.  Both very hopeful he will pass his MBS today and be able to d/c NG tube.  Will need to begin with [place] txs as pt will still be on IV abx at home.  Continue to follow.  Niana Martorana, LCSW

## 2016-01-02 NOTE — Progress Notes (Signed)
Pharmacy Antibiotic Note  William [name] is a 60 y.o. male admitted on 3/23/2Jill Side017 with cervical abscess. He is on Vanc and Rocephin, plan to continue until 4/27. SCr stable around 1 recently.   Vancomycin trough 19 mcg/ml at goal  Plan - Continue Vancomycin 1250 mg IV Q 12 hrs - BMET Q 72 hrs - Check vancomycin trough weekly, or more frequent if renal function changes.  Weight: 217 lb 4.6 oz (98.56 kg)  Temp (24hrs), Avg:98.6 F (37 C), Min:98.4 F (36.9 C), Max:98.7 F (37.1 C)   Recent Labs Lab 12/27/15 1702 12/27/15 1950  12/28/15 0824  12/30/15 1256 01/01/16 0516 01/02/16 0419 01/02/16 0803  WBC  --   --   --  6.1  --   --   --  6.1  --   CREATININE 1.07 1.09  --  1.01  --   --  1.02  --   --   VANCOTROUGH 20  --   < >  --   < > 22*  --   --  19  < > = values in this interval not displayed.  Estimated Creatinine Clearance: 90.6 mL/min (by C-G formula based on Cr of 1.02).    Allergies  Allergen Reactions  . Metformin And Related Other (See Comments)    Possible liver inflammation  . Prednisone Other (See Comments)    Disoriented   . Tamsulosin Other (See Comments)    Nose bleed  . Other Other (See Comments)    Reports all narcotics make him sick. Pt premeds with Reglan to stop nausea    Antimicrobials this admission: Vancomycin 3/16>> Zosyn 3/16>>3/20 CTX 3/20>>  Dose adjustments this admission: 3/20 VT = 7 on 1g Q 12 - > increased to 1g Q 8  3/23 VT = 20, drawn 1 hr early, but scr up 0.67 > 1.07, concerning for accumulation. -- > decrease to 750 Q8  3/23 VR was 14 3/24 VT = 14 on 750mg  IV q8h ->back to 1gm IV q8h 3/26 VT =22 on  1gm IV q8h Pt Ke 0.063 >> wait 6hr then restart @ 1250mg  Q12 3/29 VT = 19 on 1250 Q12. Renal fx stable. No change.  Microbiology results: 3/16 Cervical wound: neg, no anaerobes   Thank you for allowing pharmacy to be a part of this patient's care.  Bayard HuggerMei Timmie Dugue, PharmD, BCPS  Clinical Pharmacist  Pager: [phone]      01/02/2016 1:32 PM

## 2016-01-02 NOTE — Progress Notes (Signed)
Occupational Therapy Session Note  Patient Details  Name: William [name] Behnken MRN: [id number] Date of Birth: [birth year]  Today's Date: 01/02/2016 OT Individual Time: 1030-1100 and 1300-1400 OT Individual Time Calculation (min): 30 min and 60 min   Short Term Goals: Week 1:  OT Short Term Goal 1 (Week 1): STG = LTGs due to ELOS  Skilled Therapeutic Interventions/Progress Updates:    Session One: Pt seen for OT session focusing on self feeding. Pt sitting up in recliner upon arrival, very exited that he passed swallowing test and now able to eat. PT desiring to practice self feeding with real food now. He ambulated to therapy day room mod I managing IV pole. He sat at high/low table with R elbow supported on towels to place pt in position where he can bend elbow in gravity eliminated position to bring spoon to mouth. He was able to open containers and manipulate container in order to scoop food out. He drank nectar thick consistency juice from elongated straw. No s/s of apiration with food or liquid.  Discussed with pt OPOT and OPST and activity progression. Pt returned to room at end of session, all needs in reach.   Session Two: Pt seen for OT session focusing on ADL re-training, home mobility/ kitchen access and neuro re-ed with ROM in B UEs. Pt in recliner upon arrival. Awaiting for PICC line to be disconnected in order to complete shower task.  He ambulated throughout unit mod I managing IV pole. In kitchen, pt able to access refrigerator and overhead cabinets to retrieve and replace items. Pt tearful during this task as he was unable to reach cabinets previously. Educated regarding recommendations for kitchen set-up including keeping commonly used items on easy to reach counter level.  In therapy gym, pt completed pipe tree in sitting, focusing on fine motor coordination and gross shoulder and elbow movements. He then stood to place horseshoes on eye level basketball rim. With 2 attempts to get  momentum, pt able to place 10 horseshoes. Rests breaks required throughout. IV team nurses arrived, pt disconnected from PICC line and pt wanting to shower. He completed walk in shower transfer mod I. He bathed alternating btwn sitting and standing at mod I level. HE voiced increased fatigue following showering task, recommended sitting for showering tasks for energy conservation. He required increased assist for UB dressing due to limited UE ROM. VCs provided for problem solving positioning and cues for technique, having pt lean forward for easier access to pull shirt over head.  Pt left sitting in recliner at end of session in prep for hand off to SLP.   Therapy Documentation Precautions:  Precautions Precautions: Cervical Precaution Comments: Pt is wearing no brace in bed and applies sitting up in bed Required Braces or Orthoses: Cervical Brace Cervical Brace: Soft collar Restrictions Weight Bearing Restrictions: No Pain: Pain Assessment Pain Assessment: No/denies pain  See Function Navigator for Current Functional Status.   Therapy/Group: Individual Therapy  Creger, Helon Wisinski C 01/02/2016, 7:10 AM

## 2016-01-02 NOTE — Patient Care Conference (Signed)
Inpatient RehabilitationTeam Conference and Plan of Care Update Date: 01/01/2016   Time: 2:20 PM    Patient Name: Jill SideWarren Szczepanik      Medical Record Number: [id number]  Date of Birth: 09-11-1956 Sex: Male         Room/Bed: 4W09C/4W09C-01 Payor Info: Payor: MEDICAID [place] / Plan: MEDICAID [place] ACCESS / Product Type: *No Product type* /    Admitting Diagnosis: Central Cord  Admit Date/Time:  12/27/2015  7:09 PM Admission Comments: No comment available   Primary Diagnosis:  Central cord syndrome (HCC) Principal Problem: Central cord syndrome [facility](HCC)  Patient Active Problem List   Diagnosis Date Noted  . Dysphagia 01/01/2016  . Central cord syndrome (HCC) 12/27/2015  . Uncontrolled type 2 diabetes mellitus with complication, with long-term current use of insulin (HCC)   . Chronic hepatitis C without hepatic coma (HCC)   . H/O cervical spine surgery   . Tachycardia   . Tachypnea   . Hypokalemia   . Encounter for nasogastric (NG) tube placement   . Neck abscess   . Brodie's abscess of cervical spine (HCC) 12/20/2015  . Abscess, neck   . Endotracheal tube present   . Post-operative wound abscess   . Respiratory failure with hypoxia (HCC)   . Cervical spinal stenosis 12/05/2015  . AA (alcohol abuse) 10/11/2015  . Esophageal reflux   . Alcohol abuse   . DKA (diabetic ketoacidoses) (HCC) 08/09/2015  . Chest pain 08/09/2015  . Hyponatremia 08/09/2015  . Essential hypertension 08/09/2015  . Migraine with aura 08/09/2015  . Insomnia 08/09/2015  . Chronic pain 08/09/2015  . Diabetes mellitus (HCC) 08/09/2015  . Diabetic ketoacidosis without coma associated with type 2 diabetes mellitus (HCC)   . Weakness of left lower extremity   . Cervical radiculopathy at C5 08/03/2015  . Left upper extremity numbness 08/03/2015  . Right upper extremity numbness 08/03/2015  . Uncontrolled type 2 diabetes mellitus with background retinopathy (HCC) 07/19/2015  . Degenerative arthritis of thumb  07/10/2015  . Chronic radicular cervical pain 06/08/2015  . Degeneration of lumbar/lumbosacral disc without myelopathy 06/08/2015  . Degeneration of intervertebral disc of lumbosacral region 06/08/2015  . Metabolic syndrome 05/31/2015  . Knee pain 07/12/2014  . Balanitis 07/12/2014  . History of cocaine use 07/12/2014  . Depression 07/12/2014  . Chronic pain of multiple joints 07/12/2014  . Hx-TIA (transient ischemic attack) 07/12/2014  . Hx of migraine headaches 07/12/2014  . Obesity, Class I, BMI 30.0-34.9 (see actual BMI) 07/12/2014  . Cocaine abuse 07/12/2014  . Major depressive disorder with single episode (HCC) 07/12/2014  . Adiposity 07/12/2014  . Ache in joint 07/12/2014  . H/O disease 07/12/2014  . Personal history of transient ischemic attack (TIA), and cerebral infarction without residual deficits 07/12/2014  . Chronic hepatitis C (HCC) 07/11/2014  . Hyperlipidemia LDL goal <100 07/11/2014  . Chronic hepatitis C virus infection (HCC) 07/11/2014  . HLD (hyperlipidemia) 07/11/2014  . Uncontrolled type 2 diabetes mellitus with peripheral circulatory disorder (HCC) 12/02/2013  . Hypertension, goal below 140/90 12/02/2013  . Essential (primary) hypertension 12/02/2013  . Type 2 diabetes mellitus with peripheral angiopathy (HCC) 12/02/2013    Expected Discharge Date: Expected Discharge Date: 01/04/16  Team Members Present: Physician leading conference: Dr. Faith RogueZachary Swartz Social Worker Present: Amada JupiterLucy Annamaria Salah, LCSW Nurse Present: Carmie EndAngie Joyce, RN PT Present: Karolee StampsAlison Gray, PT OT Present: Johnsie CancelAmy Yankowski, OT SLP Present: Feliberto Gottronourtney Payne, SLP PPS Coordinator present : Tora DuckMarie Noel, RN, CRRN     Current Status/Progress Goal Weekly Team  Focus  Medical   central cord, c5 radics, dysphagia due to abscess and complications post-op  maximize functional use of arms, swallow  dysphagia, education, post-op mgt   Bowel/Bladder   cont x2 LBM 12/31/15  Patient will remain cont x2  Continue to  monitor patient status.    Swallow/Nutrition/ Hydration   NPO with NG tube, Water Protocol  Mod I with least restrictive diet  Trials of thin via straw, repeat MBS tomorrow   ADL's   Supervision- mod I with VCs for problem solving due to limited UE ROM  Mod I overall  UE ROM/ HEP, self feeding, activity tolerance, d/c planning.    Mobility   Modified independent to supervision  modified independent to supervision  community mobility, endurance, strengthening, higher level balance, d/c planning   Communication             Safety/Cognition/ Behavioral Observations            Pain   No complaints of pain  < 3 on 0-10 pain scale.   Continue to monitor patient's pain.    Skin   Incision right neck with staples  Remain free from new breakdown while on breakdown  Continue shift monitoring of patient skin.     Rehab Goals Patient on target to meet rehab goals: Yes *See Care Plan and progress notes for long and short-term goals.  Barriers to Discharge: awareness, lack of education, proximal arm weakness    Possible Resolutions to Barriers:  education, adaptive equipment training, diet advancement?    Discharge Planning/Teaching Needs:  Pt to d/c home with wife who is available to assist 24/7.  Will need to include home IV abx training along with therapies.   Team Discussion:  Primary issues continues to be swallowing.  Planning MBS tomorrow.  PT has d/c'd but OT and ST still following.  Making good gains but still with significant UE/ shoulder weakness.  Hope to begin diet after MBS.  Revisions to Treatment Plan:  None   Continued Need for Acute Rehabilitation Level of Care: The patient requires daily medical management by a physician with specialized training in physical medicine and rehabilitation for the following conditions: Daily direction of a multidisciplinary physical rehabilitation program to ensure safe treatment while eliciting the highest outcome that is of practical value  to the patient.: Yes Daily medical management of patient stability for increased activity during participation in an intensive rehabilitation regime.: Yes Daily analysis of laboratory values and/or radiology reports with any subsequent need for medication adjustment of medical intervention for : Wound care problems;Post surgical problems;Neurological problems  Vinod Mikesell 01/02/2016, 10:20 AM

## 2016-01-03 ENCOUNTER — Inpatient Hospital Stay (HOSPITAL_COMMUNITY): Payer: Medicaid Other | Admitting: Occupational Therapy

## 2016-01-03 ENCOUNTER — Inpatient Hospital Stay (HOSPITAL_COMMUNITY): Payer: Medicaid Other | Admitting: Speech Pathology

## 2016-01-03 LAB — GLUCOSE, CAPILLARY
GLUCOSE-CAPILLARY: 326 mg/dL — AB (ref 65–99)
GLUCOSE-CAPILLARY: 369 mg/dL — AB (ref 65–99)
Glucose-Capillary: 144 mg/dL — ABNORMAL HIGH (ref 65–99)
Glucose-Capillary: 100 mg/dL — ABNORMAL HIGH (ref 65–99)

## 2016-01-03 MED ORDER — PRO-STAT SUGAR FREE PO LIQD
30.0000 mL | Freq: Two times a day (BID) | ORAL | Status: DC
  Administered 2016-01-03: 30 mL via ORAL
  Filled 2016-01-03: qty 30

## 2016-01-03 NOTE — Progress Notes (Signed)
Nutrition Follow-up  DOCUMENTATION CODES:   Obesity unspecified  INTERVENTION:  Provide 30 ml Prostat po BID, each supplement provides 100 kcal and 15 grams of protein.   Encourage adequate PO intake.   NUTRITION DIAGNOSIS:   Inadequate oral intake related to inability to eat as evidenced by NPO status; diet advanced; po 70-100%; ongoing  GOAL:   Patient will meet greater than or equal to 90% of their needs; met  MONITOR:   PO intake, Supplement acceptance, Weight trends, Labs, I & O's  REASON FOR ASSESSMENT:   Consult Enteral/tube feeding initiation and management  ASSESSMENT:   60 y.o. male with history of HTN, DMT2, Hep C, cervical stenosis with myelopathy s/p ACDF C3-C7 12/05/15 with post post op near complete loss of shoulder abduction and marked bilateral biceps weakness but improvement in overall neurologic deficits. He was admitted on 12/20/15 with neck swelling, inability to swallow, fever and erythema of neck/chest.  He was taken to OR for exploration of wound with drainage of abscess and irrigation of esophagus MBS done revealing posterior pharyngeal edema leading to poor laryngeal closure with Penetration/aspiration and NPO recommended. He has been reporting fullness in his throat for past 24 hours but no SOB. NGT placed  MBS done yesterday. Diet advanced to a dysphagia 2 with nectar thick liquids. NGT out. Tube feeding discontinued yesterday. Meal completion has been 70-100%. Pt reports appetite is fine. Pt does however report indigestion at times, however has received medication for it. Pt currently has Prostat ordered TID has been consuming them. RD to modify orders to decrease Prostat to BID as intake has improved. Pt and wife were educated briefly on food recommended and not recommended on a dysphagia 2 diet. Plans for discharge tomorrow.   Diet Order:  DIET DYS 2 Room service appropriate?: Yes; Fluid consistency:: Nectar Thick  Skin:   (Incision on neck)  Last  BM:  3/28  Height:   Ht Readings from Last 1 Encounters:  12/20/15 '5\' 10"'  (1.778 m)    Weight:   Wt Readings from Last 1 Encounters:  01/03/16 208 lb 15.9 oz (94.8 kg)    Ideal Body Weight:  75.45 kg  BMI:  Body mass index is 29.99 kg/(m^2).  Estimated Nutritional Needs:   Kcal:  2000-2200  Protein:  115-130 grams  Fluid:  2- 2.2 L/day  EDUCATION NEEDS:   No education needs identified at this time  Corrin Parker, MS, RD, LDN Pager # [phone] After hours/ weekend pager # [phone]

## 2016-01-03 NOTE — Progress Notes (Signed)
Speech Language Pathology Daily Session Note  Patient Details  Name: William [name] MRN: [id number] Date of Birth: 17-Mar-1956  Today's Date: 01/03/2016 SLP Individual Time: 1030-1100 SLP Individual Time Calculation (min): 30 min  Short Term Goals: Week 1: SLP Short Term Goal 1 (Week 1): Pt to tolerate trials of ice chips without overt s/s aspiration. SLP Short Term Goal 2 (Week 1): Pt to demonstrate awareness of dysphagia/fatigue for assessing readiness for ice chip protocol. SLP Short Term Goal 3 (Week 1): Pt to verbalize swallowing limitations and precautions at mod I. SLP Short Term Goal 4 (Week 1): Pt will demonstate tolerance of his Dys.2 textures and nectar-thick liquids via straw diet as evidenced by vitals and imaging remaining WFL.  Skilled Therapeutic Interventions: Pt seen for dysphagia therapy with focus on water protocol. Pt able to verbalize requirements/stipulations of water protocol for teachback demonstration of knowedge. After oral care, pt took thin water via straw with small sips. Occasional throat clear noted- vocal quality remained clear throughout the session. Significant other was present throughout the session.   Function:  Eating Eating   Modified Consistency Diet: Yes Eating Assist Level: Swallowing techniques: self managed   Eating Set Up Assist For: Opening containers       Cognition Comprehension Comprehension assist level: Follows complex conversation/direction with no assist  Expression   Expression assist level: Expresses complex ideas: With no assist  Social Interaction Social Interaction assist level: Interacts appropriately with others with medication or extra time (anti-anxiety, antidepressant).  Problem Solving Problem solving assist level: Solves complex problems: With extra time  Memory Memory assist level: Recognizes or recalls 90% of the time/requires cueing < 10% of the time    Pain Pain Assessment Pain Assessment: No/denies  pain  Therapy/Group: Individual Therapy  Rocky CraftsKara E Hasan Douse 01/03/2016, 12:29 PM

## 2016-01-03 NOTE — Progress Notes (Signed)
Speech Language Pathology Daily Session Note  Patient Details  Name: William [name] MRN: [id number] Date of Birth: 03/24/1956  Today's Date: 01/03/2016 SLP Individual Time: 0900-0930 SLP Individual Time Calculation (min): 30 min  Short Term Goals: Week 1: SLP Short Term Goal 1 (Week 1): Pt to tolerate trials of ice chips without overt s/s aspiration. SLP Short Term Goal 2 (Week 1): Pt to demonstrate awareness of dysphagia/fatigue for assessing readiness for ice chip protocol. SLP Short Term Goal 3 (Week 1): Pt to verbalize swallowing limitations and precautions at mod I. SLP Short Term Goal 4 (Week 1): Pt will demonstate tolerance of his Dys.2 textures and nectar-thick liquids via straw diet as evidenced by vitals and imaging remaining WFL.  Skilled Therapeutic Interventions: Skilled treatment session focused on dysphagia goals. SLP facilitated session by providing verbal education along with demonstration on how to appropriately thicken liquids to a nectar-thick consistency. Both verbalized understanding. Patient consumed nectar-thick liquids via straw without overt s/s of aspiration and was Mod I for use of swallowing strategies. Patient left upright in chair with all needs within reach. Continue with current plan of care.   Function:  Eating Eating   Modified Consistency Diet: Yes Eating Assist Level: Supervision or verbal cues;Set up assist for   Eating Set Up Assist For: Opening containers       Cognition Comprehension Comprehension assist level: Follows complex conversation/direction with no assist  Expression   Expression assist level: Expresses complex ideas: With no assist  Social Interaction Social Interaction assist level: Interacts appropriately with others with medication or extra time (anti-anxiety, antidepressant).  Problem Solving Problem solving assist level: Solves complex problems: With extra time  Memory Memory assist level: Recognizes or recalls 90% of the  time/requires cueing < 10% of the time    Pain No/Denies Pain  Therapy/Group: Individual Therapy  William [name] 01/03/2016, 5:08 PM

## 2016-01-03 NOTE — Progress Notes (Signed)
Occupational Therapy Session Note  Patient Details  Name: William [name] Seats MRN: [id number] Date of Birth: 12/24/1955  Today's Date: 01/03/2016 OT Individual Time: 0730-800 and 1345-1415 OT Total Time: 30 min and 30 min   Short Term Goals: Week 1:  OT Short Term Goal 1 (Week 1): STG = LTGs due to ELOS  Skilled Therapeutic Interventions/Progress Updates:    Session One: Pt seen for OT session focusing on self feeding. Pt sitting up in recliner upon arrival with NT present assisting with opening containers and setting up pt with breakfast tray. He was able to direct caregiver with proper set-up of towels for elbow support for R UE in prep for self feeding. Therapist provided pt with full supervision during meal per SLP orders. Pt able to self feed and manage drinking glass with elbow supported in gravity eliminated position. He demonstrated ability to open pop-top can and yogurt container with increased time.   He ambulated to sink and completed oral care with set-up to open mouthwash container. Pt returned to chair at end of session, left with all needs in reach.  Discussed with pt d/c planning, incorporating therapies into daily schedule, and role of home health therapies.   Session Two: Pt seen for OT session focusing on ADL re-training. Pt received in shower with hand off from other OT. He dressed LB mod I. He sat on EOB and supported UEs on table with shirt set-up. With increased time and VCs for technique, pt able thread B UEs into shirt and place over top of head.  He completed grooming mod I at sink. Water protocol completed per SLP orders with no s/s of aspiration with pt completing one throat clear. Pt left in recliner at end of session, all needs in reach. Educated pt regarding set-up of tub transfer bench, energy conservation, and d/c planning.    Therapy Documentation Precautions:  Precautions Precautions: Cervical Precaution Comments: Pt is wearing no brace in bed and applies  sitting up in bed Required Braces or Orthoses: Cervical Brace Cervical Brace: Soft collar, For comfort Restrictions Weight Bearing Restrictions: No Pain:   No/ denies pain  See Function Navigator for Current Functional Status.   Therapy/Group: Individual Therapy  Gilvin, Lyndzee Kliebert C 01/03/2016, 12:16 PM

## 2016-01-03 NOTE — Progress Notes (Signed)
Occupational Therapy Session Note  Patient Details  Name: William [name] MRN: [id number] Date of Birth: 1956/10/03  Today's Date: 01/03/2016 OT Individual Time: 1300-1345 OT Individual Time Calculation (min): 45 min    Short Term Goals: Week 1:  OT Short Term Goal 1 (Week 1): STG = LTGs due to ELOS  Skilled Therapeutic Interventions/Progress Updates:  Upon entering the room, pt seated in recliner chair with no c/o pain this session. Pt agreeable to OT intervention and girlfriend present in the room. OT providing paper handouts regarding energy conservation for self care, IADLs, and community tasks such as appointments. OT giving examples of functional tasks in which these concepts are utilized with pt verbalizing understanding and providing examples as well. Pt ambulating in room without use of AD to obtain all materials needed for bathing and dressing tasks with mod I. Pt performed shower transfer onto shower seat with supervision for safety. Pt bathing at shower level with intermittent supervision. Session handed over to primary OT for continued OT intervention.   Therapy Documentation Precautions:  Precautions Precautions: Cervical Precaution Comments: Pt is wearing no brace in bed and applies sitting up in bed Required Braces or Orthoses: Cervical Brace Cervical Brace: Soft collar, For comfort Restrictions Weight Bearing Restrictions: No   Pain: Pain Assessment Pain Assessment: No/denies pain  See Function Navigator for Current Functional Status.   Therapy/Group: Individual Therapy  Lowella Gripittman, Graciela Plato L 01/03/2016, 1:39 PM

## 2016-01-03 NOTE — Progress Notes (Signed)
Occupational Therapy Discharge Summary  Patient Details  Name: William [name] MRN: [id number] Date of Birth: Dec 31, 1955  Patient has met 10 of 10 long term goals due to improved activity tolerance, improved balance, postural control, ability to compensate for deficits and functional use of  RIGHT upper and LEFT upper extremity.  Patient to discharge at overall supervision- mod I level.  Patient's care partner is independent to provide the necessary physical assistance at discharge.    Recommendation:  Patient will benefit from ongoing skilled OT services in home health setting to continue to advance functional skills in the area of BADL and iADL.  Equipment: Tub transfer bench  Reasons for discharge: treatment goals met and discharge from hospital  Patient/family agrees with progress made and goals achieved: Yes  OT Discharge Precautions/Restrictions  Precautions Precautions: Cervical Required Braces or Orthoses: Cervical Brace Cervical Brace: Soft collar;For comfort Restrictions Weight Bearing Restrictions: No Vision/Perception  Vision- History Baseline Vision/History: Wears glasses Wears Glasses: At all times Vision- Assessment Vision Assessment?: No apparent visual deficits  Cognition Overall Cognitive Status: Within Functional Limits for tasks assessed Arousal/Alertness: Awake/alert Orientation Level: Oriented X4 Selective Attention: Appears intact Memory: Appears intact Awareness: Appears intact Safety/Judgment: Appears intact Sensation Sensation Light Touch: Appears Intact Coordination Gross Motor Movements are Fluid and Coordinated: No Fine Motor Movements are Fluid and Coordinated: Yes Coordination and Movement Description: impaired UE coordination due to proximal strength deficits, however, improved from admission Motor  Motor Motor: Other (comment) (B UE paraperesis) Trunk/Postural Assessment  Cervical Assessment Cervical Assessment: Exceptions to [facility]  (Cervical precautions) Thoracic Assessment Thoracic Assessment: Within Functional Limits Lumbar Assessment Lumbar Assessment: Within Functional Limits Postural Control Postural Control: Within Functional Limits  Balance Balance Balance Assessed: Yes Static Sitting Balance Static Sitting - Balance Support: No upper extremity supported;Feet unsupported Static Sitting - Level of Assistance: 7: Independent Dynamic Sitting Balance Dynamic Sitting - Balance Support: Feet supported;During functional activity;No upper extremity supported Dynamic Sitting - Level of Assistance: 7: Independent Static Standing Balance Static Standing - Balance Support: No upper extremity supported;During functional activity Static Standing - Level of Assistance: 7: Independent Dynamic Standing Balance Dynamic Standing - Balance Support: No upper extremity supported;During functional activity Dynamic Standing - Level of Assistance: 7: Independent Extremity/Trunk Assessment RUE Assessment RUE Assessment: Exceptions to [facility] (Shoulder elevation used to compensation for decreaesed shoulder  flexion; 2+/5 elbow flexion; wrist and grasp WNL) LUE Assessment LUE Assessment: Exceptions to [facility] (Shoulder elevation used to compensation for decreaesed shoulder  flexion; 2+/5 elbow flexion; wrist and grasp WNL)   See Function Navigator for Current Functional Status.  Daigler, Tytiana Coles C 01/03/2016, 12:17 PM

## 2016-01-03 NOTE — Progress Notes (Signed)
Speech Language Pathology Session Note & Discharge Summary  Patient Details  Name: William [name] MRN: [id number] Date of Birth: [birth year]  Today's Date: 01/03/2016 SLP Individual Time: 1500-1535 SLP Individual Time Calculation (min): 35 min   Skilled Therapeutic Interventions:  Skilled treatment session focused on completion of patient and family education with the patient's significant other. Both were educated in regards to the patient's current swallowing function, diet recommendations, appropriate textures, procedures of water protocol and proper administration of medications. Both verbalized understanding and asked appropriate questions. Handouts were also given to reinforce information. Patient left upright in chair with all needs within reach. Continue with current plan of care.   Patient has met 3 of 3 long term goals.  Patient to discharge at overall Modified Independent level.   Reasons goals not met: N/A   Clinical Impression/Discharge Summary: Patient has made functional gains and has met 3 of 3 LTG's this admission due to improved swallowing function. Currently, patient is consuming Dys. 2 textures with nectar-thick liquids with minimal overt s/s of aspiration and is Mod I for use of swallowing compensatory strategies. The water protocol was also initiated and patient can independently verbalize the appropriate procedures in order to consume water safely.  Patient and family education is complete and patient will discharge home with 24 hour supervision from family. Patient would benefit from f/u SLP services to maximize his swallowing function and reduce caregiver burden.   Recommendation:  Home Health SLP  Rationale for SLP Follow Up: Maximize swallowing safety   Equipment: Thickener   Reasons for discharge: Treatment goals met;Discharged from hospital   Patient/Family Agrees with Progress Made and Goals Achieved: Yes   Function:  Eating Eating   Modified Consistency  Diet: Yes Eating Assist Level: Supervision or verbal cues;Set up assist for   Eating Set Up Assist For: Opening containers       Cognition Comprehension Comprehension assist level: Follows complex conversation/direction with no assist  Expression   Expression assist level: Expresses complex ideas: With no assist  Social Interaction Social Interaction assist level: Interacts appropriately with others with medication or extra time (anti-anxiety, antidepressant).  Problem Solving Problem solving assist level: Solves complex problems: With extra time  Memory Memory assist level: Recognizes or recalls 90% of the time/requires cueing < 10% of the time   William [name] 01/03/2016, 5:14 PM

## 2016-01-03 NOTE — Progress Notes (Signed)
[facility]     PROGRESS NOTE    Subjective/Complaints: Sitting in chair. No complaints. Anxious for MBS today. Denies any other issues. Feels that his arms are getting stronger.   ROS: Pt denies fever, rash/itching, headache, blurred or double vision, nausea, vomiting, abdominal pain, diarrhea, chest pain, shortness of breath, palpitations, dysuria, dizziness, back pain, bleeding, anxiety, or depression   Objective: Vital Signs: Blood pressure 132/90, pulse 102, temperature 98.4 F (36.9 C), temperature source Oral, resp. rate 22, weight 94.8 kg (208 lb 15.9 oz), SpO2 97 %. Dg Swallowing Func-speech Pathology  01/02/2016  Objective Swallowing Evaluation: Type of Study: MBS-Modified Barium Swallow Study Patient Details Name: William [name] MRN: [id number] Date of Birth: 02/05/1956 Today's Date: 01/02/2016 Time: SLP Start Time (ACUTE ONLY): 1023-SLP Stop Time (ACUTE ONLY): 1046 SLP Time Calculation (min) (ACUTE ONLY): 23 min Past Medical History: Past Medical History Diagnosis Date . Hypertension  . Diabetes mellitus without complication (HCC)  . Chronic hepatitis C without hepatic coma (HCC)  . History of cocaine use  . Depression  . Arthritis  . History of TIA (transient ischemic attack)  . Hyperlipidemia  . History of migraine headaches  . Obesity, Class II, BMI 35-39.9  . BPH with obstruction/lower urinary tract symptoms  . Balanitis  . Urinary retention  . Tobacco abuse  Past Surgical History: Past Surgical History Procedure Laterality Date . Rotator  cuff surgery      left shoulder  . Spider bite     on neck . Anterior cervical decomp/discectomy fusion N/A 12/05/2015   Procedure: Anterior Cervical Decompression Fusion Cervical three-four, Cervical four-five, Cervical five-six, Cervical six-seven;  Surgeon: Julio Sicks, MD;  Location: [facility];  Service: Neurosurgery;  Laterality: N/A; . Anterior cervical decompression for epidural abscess N/A 12/20/2015   Procedure:  ANTERIOR CERVICAL DECOMPRESSION FOR EPIDURAL ABSCESS;  Surgeon: Hilda Lias, MD;  Location: [facility];  Service: Neurosurgery;  Laterality: N/A;  ANTERIOR CERVICAL DECOMPRESSION FOR EPIDURAL ABSCESS HPI: 60yo male smoker with hx HTN, DM, Hep C, spinal stenosis s/p anterior cervical fusion on 3/1. Had C5 radiculopathy following surgery then went to rehab. Returned on 3/16 with c/o swelling, dysphagia, fever. CT neck in ER revealed large surgical site abscess extending from skin to hardware. He was taken urgently to OR 3/16 and remained intubated post op for airway protection. Extubated 3/19. "Difficult airway" noted regarding intubation.  Patient admitted to IP Rehab and has been making functional gains at the bedside as a result a repeat objective assessment was warranted.  No Data Recorded Assessment / Plan / Recommendation [facility] IP CLINICAL IMPRESSIONS 01/02/2016 Therapy Diagnosis Moderate oral phase dysphagia Clinical Impression Patient with improvement since previous objective assessment on 12/25/15.  Patient now presents with moderate motor based pharyngeal dysphagia as a result of edema following cervical surgery.  The limited space has impacted hyolaryngeal excursion, leading to reduced epiglottic excursion, reduced laryngeal closure, and diffuse residue resulting in penetration of nectar-thick liquids and aspiration of thin liquid consistencies.  Puree and soft solid consistencies also resulted in moderate pharyngeal residue that patient managed with multiple swallows and intermittent throat clears.  Recommend initiation of a Dys.2 texture diet with nectar-thick liquids via straw.  Consider Free Water Protocol given minimal aspiration.  SLP will follow. SLP initiated education regarding consistency of nectar-thick liquids, how to thicken liquids and prepare Dys.2/minced solids.  Will continue in next session.   Impact on safety and function Mild aspiration risk   [facility] IP  TREATMENT RECOMMENDATION 01/02/2016  Treatment Recommendations Other (Comment)   Prognosis 01/02/2016 Prognosis for Safe Diet Advancement Good Barriers to Reach Goals Other (Comment) Barriers/Prognosis Comment -- [facility] IP DIET RECOMMENDATION 01/02/2016 SLP Diet Recommendations Dysphagia 2 (Fine chop) solids;Nectar thick liquid Liquid Administration via Straw Medication Administration Crushed with puree Compensations Slow rate;Small sips/bites Postural Changes --   [facility] IP OTHER RECOMMENDATIONS 01/02/2016 Recommended Consults -- Oral Care Recommendations Oral care BID Other Recommendations Order thickener from pharmacy;Prohibited food (jello, ice cream, thin soups);Remove water pitcher;Have oral suction available   [facility] IP FOLLOW UP RECOMMENDATIONS 01/02/2016 Follow up Recommendations Outpatient SLP   No flowsheet data found.     [facility] IP ORAL PHASE 01/02/2016 Oral Phase WFL Oral - Pudding Teaspoon -- Oral - Pudding Cup -- Oral - Honey Teaspoon -- Oral - Honey Cup -- Oral - Nectar Teaspoon -- Oral - Nectar Cup -- Oral - Nectar Straw -- Oral - Thin Teaspoon -- Oral - Thin Cup -- Oral - Thin Straw -- Oral - Puree -- Oral - Mech Soft -- Oral - Regular -- Oral - Multi-Consistency -- Oral - Pill -- Oral Phase - Comment --  [facility] IP PHARYNGEAL PHASE 01/02/2016 Pharyngeal Phase Impaired Pharyngeal- Pudding Teaspoon -- Pharyngeal -- Pharyngeal- Pudding Cup -- Pharyngeal -- Pharyngeal- Honey Teaspoon -- Pharyngeal -- Pharyngeal- Honey Cup -- Pharyngeal -- Pharyngeal- Nectar Teaspoon -- Pharyngeal -- Pharyngeal- Nectar Cup -- Pharyngeal -- Pharyngeal- Nectar Straw Reduced epiglottic inversion;Reduced pharyngeal peristalsis;Reduced anterior laryngeal mobility;Reduced laryngeal elevation;Reduced airway/laryngeal closure;Penetration/Apiration after swallow;Pharyngeal residue - valleculae;Pharyngeal residue - pyriform Pharyngeal Material enters airway, remains ABOVE vocal cords then ejected out Pharyngeal- Thin Teaspoon -- Pharyngeal -- Pharyngeal- Thin Cup -- Pharyngeal --  Pharyngeal- Thin Straw Penetration/Aspiration during swallow;Reduced airway/laryngeal closure;Reduced laryngeal elevation;Reduced anterior laryngeal mobility;Reduced epiglottic inversion;Reduced pharyngeal peristalsis;Pharyngeal residue - valleculae;Pharyngeal residue - pyriform;Trace aspiration Pharyngeal Material enters airway, passes BELOW cords and not ejected out despite cough attempt by patient Pharyngeal- Puree Reduced pharyngeal peristalsis;Reduced airway/laryngeal closure;Reduced laryngeal elevation;Reduced anterior laryngeal mobility;Reduced epiglottic inversion;Pharyngeal residue - valleculae;Pharyngeal residue - pyriform Pharyngeal Material does not enter airway Pharyngeal- Mechanical Soft Reduced pharyngeal peristalsis;Reduced epiglottic inversion;Reduced anterior laryngeal mobility;Reduced laryngeal elevation;Reduced airway/laryngeal closure;Pharyngeal residue - valleculae;Pharyngeal residue - pyriform Pharyngeal Material does not enter airway Pharyngeal- Regular -- Pharyngeal -- Pharyngeal- Multi-consistency -- Pharyngeal -- Pharyngeal- Pill -- Pharyngeal -- Pharyngeal Comment --  CHL IP CERVICAL ESOPHAGEAL PHASE 01/02/2016 Cervical Esophageal Phase Impaired Pudding Teaspoon -- Pudding Cup -- Honey Teaspoon -- Honey Cup -- Nectar Teaspoon -- Nectar Cup -- Nectar Straw -- Thin Teaspoon -- Thin Cup -- Thin Straw -- Puree -- Mechanical Soft -- Regular -- Multi-consistency -- Pill -- Cervical Esophageal Comment increased clearance as compred to previous study  No flowsheet data found. William [name], M.A., CCC-SLP [id number] BOWIE,MELISSA 01/02/2016, 10:22 AM               Recent Labs  01/02/16 0419  WBC 6.1  HGB 13.1  HCT 40.3  PLT 368    Recent Labs  01/01/16 0516  NA 145  K 3.6  CL 106  GLUCOSE 232*  BUN 12  CREATININE 1.02  CALCIUM 9.4   CBG (last 3)   Recent Labs  01/02/16 1652 01/02/16 2201 01/03/16 0703  GLUCAP 296* 201* 144*    Wt Readings from Last 3 Encounters:   01/03/16 94.8 kg (208 lb 15.9 oz)  12/24/15 105.5 kg (232 lb 9.4 oz)  12/05/15 108.727 kg (239 lb 11.2 oz)    Physical Exam:  HEENT NGT.  Staples in neck, incision cdi General: No acute distress Mood and affect are appropriate Heart: Regular rate and rhythm no rubs murmurs or rubs Lungs: Clear to auscultation, normal effort.  no rales or wheezes Abdomen: Positive bowel sounds, soft nontender to palpation, nondistended Extremities: No clubbing, cyanosis, or edema Skin: No evidence of breakdown, no evidence of rash Neurologic: Cranial nerves II through XII intact, motor strength is 1+ to 2-/5 in bilateral deltoid, 3+ tricep, 4 grip,4+ hip flexor, knee extensors, ankle dorsiflexor and plantar flexor--stable Sensory exam normal sensation to light touch in bilateral upper and lower extremities Cerebellar exam normal finger to nose to finger as well as heel to shin in bilateral upper and lower extremities Musculoskeletal: Full range of motion in all 4 extremities. No joint swelling, Mild dorsum of the hand swelling/edema, nontender no erythema Deep tendon reflexes are hyporeflexic bilateral upper and lower limbs Cognitively he's appropriate Psych: pleasant and appropriate    Assessment/Plan: 1. Weakness and mobility/functional deficits secondary to cervical myelopathy/C5 radiculopathy which require 3+ hours per day of interdisciplinary therapy in a comprehensive inpatient rehab setting. Physiatrist is providing close team supervision and 24 hour management of active medical problems listed below. Physiatrist and rehab team continue to assess barriers to discharge/monitor patient progress toward functional and medical goals.  Function:  Bathing Bathing position   Position: Shower  Bathing parts Body parts bathed by patient: Right arm, Left arm, Chest, Abdomen, Front perineal area, Right upper leg, Left upper leg, Right lower leg, Left lower leg, Buttocks Body parts bathed by helper:  Back, Buttocks  Bathing assist Assist Level: More than reasonable time      Upper Body Dressing/Undressing Upper body dressing   What is the patient wearing?: Pull over shirt/dress     Pull over shirt/dress - Perfomed by patient: Thread/unthread right sleeve, Thread/unthread left sleeve, Pull shirt over trunk Pull over shirt/dress - Perfomed by helper: Put head through opening        Upper body assist Assist Level:  (Max assist)      Lower Body Dressing/Undressing Lower body dressing   What is the patient wearing?: Underwear, Pants, Non-skid slipper socks Underwear - Performed by patient: Thread/unthread right underwear leg, Pull underwear up/down, Thread/unthread left underwear leg Underwear - Performed by helper: Pull underwear up/down Pants- Performed by patient: Thread/unthread right pants leg, Thread/unthread left pants leg, Pull pants up/down Pants- Performed by helper: Pull pants up/down Non-skid slipper socks- Performed by patient: Don/doff right sock, Don/doff left sock                    Lower body assist Assist for lower body dressing: More than reasonable time      Toileting Toileting   Toileting steps completed by patient: Adjust clothing prior to toileting, Adjust clothing after toileting Toileting steps completed by helper: Adjust clothing after toileting Toileting Assistive Devices: Grab bar or rail  Toileting assist Assist level: More than reasonable time   Transfers Chair/bed transfer   Chair/bed transfer method: Ambulatory Chair/bed transfer assist level: Supervision or verbal cues Chair/bed transfer assistive device: Patent attorney     Max distance: >300 ft Assist level: No help, No cues, assistive device, takes more than a reasonable amount of time   Wheelchair          Cognition Comprehension Comprehension assist level: Follows complex conversation/direction with extra time/assistive device, Follows complex  conversation/direction with no assist  Expression Expression assist level: Expresses complex ideas: With  extra time/assistive device, Expresses complex ideas: With no assist  Social Interaction Social Interaction assist level: Interacts appropriately with others with medication or extra time (anti-anxiety, antidepressant).  Problem Solving Problem solving assist level: Solves complex problems: With extra time  Memory Memory assist level: Recognizes or recalls 90% of the time/requires cueing < 10% of the time  Medical Problem List and Plan: 1. Cervical myelopathy and bilateral C5 radiculopathy secondary to Cervical spinal stenosis and postoperative abscess  -dc tomorrow  -finalize dc planning  2. DVT Prophylaxis/Anticoagulation: Pharmaceutical: Heparin indicated 3. Pain Management:  oxycodone 1 mg per mL, 5-10 mL every 4 hours when necessary 4. Mood: Overt signs of depression, will monitor for adjustment disorder, neuropsychologist evaluation as necessary 5. Neuropsych: This patient iscapable of making decisions on his own behalf. 6. Skin/Wound Care: monitor wound for healing 7. Fluids/Electrolytes/Nutrition:   -continue NGT feedings, hold prn, elevating hob 8. HTN: Monitor BP bid.  9. DMT2: Monitor BS ac/hs. Use SSI   - Increased lantus  to 35 u qhs and added 10u qam with improvement  -follow for pattern today on oral diet 10. Neck abscess: To continue Vanc  and Ceftriaxone 2 grams daily till April 27th-He will need to go home on IV antibiotics and a more permanent IV access line would need to be placed 11. Dysphagia with pharyngeal edema--passed MBS for D2/nectars    LOS (Days) 7 A FACE TO FACE EVALUATION WAS PERFORMED  SWARTZ,ZACHARY T 01/03/2016 9:23 AM

## 2016-01-03 NOTE — Discharge Instructions (Signed)
Inpatient Rehab Discharge Instructions  Jill SideWarren Kerrick Discharge date and time:  01/04/16  Activities/Precautions/ Functional Status: Activity: no lifting, driving, or strenuous exercise till cleared by MD Diet:  Chopped foods--dysphagia 2, nectar liquids. Use straw.  Wound Care: keep wound clean and dry   Functional status:  ___ No restrictions     ___ Walk up steps independently ___ 24/7 supervision/assistance   ___ Walk up steps with assistance _X__ Intermittent supervision/assistance  ___ Bathe/dress independently ___ Walk with walker     _X__ Bathe/dress with assistance ___ Walk Independently    ___ Shower independently ___ Walk with assistance    ___ Shower with assistance ___ No alcohol     ___ Return to work/school ________     COMMUNITY REFERRALS UPON DISCHARGE:    Home Health:  OT     ST   RN                    Agency:  [facility] Phone: [phone]   Medical Equipment/Items Ordered: tub bench                                                     Agency/Supplier: [facility] @ [id number]   Special Instructions: Intravenous Rocephin 2 g daily until 01/31/2016 and stop Intravenous vancomycin 1250 mg every 12 hours until 01/31/2016 and stop   My questions have been answered and I understand these instructions. I will adhere to these goals and the provided educational materials after my discharge from the hospital.  Patient/Caregiver Signature _______________________________ Date __________  Clinician Signature _______________________________________ Date __________  Please bring this form and your medication list with you to all your follow-up doctor's appointments.

## 2016-01-04 LAB — BASIC METABOLIC PANEL
Anion gap: 9 (ref 5–15)
BUN: 10 mg/dL (ref 6–20)
CHLORIDE: 106 mmol/L (ref 101–111)
CO2: 28 mmol/L (ref 22–32)
Calcium: 9.6 mg/dL (ref 8.9–10.3)
Creatinine, Ser: 0.96 mg/dL (ref 0.61–1.24)
GFR calc Af Amer: >60 mL/min (ref >60)
GFR calc non Af Amer: >60 mL/min (ref >60)
GLUCOSE: 173 mg/dL — AB (ref 65–99)
POTASSIUM: 4.3 mmol/L (ref 3.5–5.1)
Sodium: 143 mmol/L (ref 135–145)

## 2016-01-04 LAB — GLUCOSE, CAPILLARY: GLUCOSE-CAPILLARY: 234 mg/dL — AB (ref 65–99)

## 2016-01-04 MED ORDER — HEPARIN SOD (PORK) LOCK FLUSH 100 UNIT/ML IV SOLN: 250.0000 [IU] | INTRAVENOUS | Status: DC | PRN

## 2016-01-04 MED ORDER — AMLODIPINE BESYLATE 10 MG PO TABS: 10.0000 mg | ORAL_TABLET | Freq: Every day | ORAL | Status: DC

## 2016-01-04 MED ORDER — HEPARIN SOD (PORK) LOCK FLUSH 100 UNIT/ML IV SOLN
250.0000 [IU] | INTRAVENOUS | Status: DC | PRN
  Administered 2016-01-04: 250 [IU]
  Filled 2016-01-04: qty 3

## 2016-01-04 MED ORDER — GABAPENTIN 100 MG PO CAPS
100.0000 mg | ORAL_CAPSULE | Freq: Three times a day (TID) | ORAL | Status: DC
Start: 2016-01-04 — End: 2016-02-12

## 2016-01-04 MED ORDER — VANCOMYCIN HCL 10 G IV SOLR: 1250.0000 mg | Freq: Two times a day (BID) | INTRAVENOUS | Status: DC

## 2016-01-04 MED ORDER — CEFTRIAXONE SODIUM 2 G IJ SOLR: 2.0000 g | INTRAMUSCULAR | Status: DC

## 2016-01-04 MED ORDER — HEPARIN SOD (PORK) LOCK FLUSH 100 UNIT/ML IV SOLN
250.0000 [IU] | Freq: Every day | INTRAVENOUS | Status: DC
  Filled 2016-01-04: qty 2.5

## 2016-01-04 MED ORDER — PANTOPRAZOLE SODIUM 40 MG PO TBEC: 40.0000 mg | DELAYED_RELEASE_TABLET | Freq: Two times a day (BID) | ORAL | Status: AC

## 2016-01-04 MED ORDER — HEPARIN SOD (PORK) LOCK FLUSH 100 UNIT/ML IV SOLN
250.0000 [IU] | Freq: Every day | INTRAVENOUS | Status: DC
  Administered 2016-01-04: 250 [IU]

## 2016-01-04 MED ORDER — INSULIN GLARGINE 100 UNIT/ML ~~LOC~~ SOLN: 10.0000 [IU] | Freq: Every day | SUBCUTANEOUS | Status: DC

## 2016-01-04 MED ORDER — INSULIN GLARGINE 100 UNIT/ML ~~LOC~~ SOLN: 35.0000 [IU] | Freq: Every day | SUBCUTANEOUS | Status: AC

## 2016-01-04 NOTE — Progress Notes (Signed)
[facility]ICINE & REHABILITATION     PROGRESS NOTE    Subjective/Complaints: No complaints. Ready for home today  ROS: Pt denies fever, rash/itching, headache, blurred or double vision, nausea, vomiting, abdominal pain, diarrhea, chest pain, shortness of breath, palpitations, dysuria, dizziness, back pain, bleeding, anxiety, or depression   Objective: Vital Signs: Blood pressure 137/92, pulse 65, temperature 98.6 F (37 C), temperature source Oral, resp. rate 18, weight 98.567 kg (217 lb 4.8 oz), SpO2 98 %. Dg Swallowing Func-speech Pathology  01/02/2016  Objective Swallowing Evaluation: Type of Study: MBS-Modified Barium Swallow Study Patient Details Name: William [name] MRN: [id number] Date of Birth: 08/06/1956 Today's Date: 01/02/2016 Time: SLP Start Time (ACUTE ONLY): 1023-SLP Stop Time (ACUTE ONLY): 1046 SLP Time Calculation (min) (ACUTE ONLY): 23 min Past Medical History: Past Medical History Diagnosis Date . Hypertension  . Diabetes mellitus without complication (HCC)  . Chronic hepatitis C without hepatic coma (HCC)  . History of cocaine use  . Depression  . Arthritis  . History of TIA (transient ischemic attack)  . Hyperlipidemia  . History of migraine headaches  . Obesity, Class II, BMI 35-39.9  . BPH with obstruction/lower urinary tract symptoms  . Balanitis  . Urinary retention  . Tobacco abuse  Past Surgical History: Past Surgical History Procedure Laterality Date . Rotator  cuff surgery      left shoulder  . Spider bite     on neck . Anterior cervical decomp/discectomy fusion N/A 12/05/2015   Procedure: Anterior Cervical Decompression Fusion Cervical three-four, Cervical four-five, Cervical five-six, Cervical six-seven;  Surgeon: Julio Sicks, MD;  Location: [facility];  Service: Neurosurgery;  Laterality: N/A; . Anterior cervical decompression for epidural abscess N/A 12/20/2015   Procedure: ANTERIOR CERVICAL DECOMPRESSION FOR EPIDURAL ABSCESS;  Surgeon: Hilda Lias, MD;   Location: [facility];  Service: Neurosurgery;  Laterality: N/A;  ANTERIOR CERVICAL DECOMPRESSION FOR EPIDURAL ABSCESS HPI: 60yo male smoker with hx HTN, DM, Hep C, spinal stenosis s/p anterior cervical fusion on 3/1. Had C5 radiculopathy following surgery then went to rehab. Returned on 3/16 with c/o swelling, dysphagia, fever. CT neck in ER revealed large surgical site abscess extending from skin to hardware. He was taken urgently to OR 3/16 and remained intubated post op for airway protection. Extubated 3/19. "Difficult airway" noted regarding intubation.  Patient admitted to IP Rehab and has been making functional gains at the bedside as a result a repeat objective assessment was warranted.  No Data Recorded Assessment / Plan / Recommendation [facility] IP CLINICAL IMPRESSIONS 01/02/2016 Therapy Diagnosis Moderate oral phase dysphagia Clinical Impression Patient with improvement since previous objective assessment on 12/25/15.  Patient now presents with moderate motor based pharyngeal dysphagia as a result of edema following cervical surgery.  The limited space has impacted hyolaryngeal excursion, leading to reduced epiglottic excursion, reduced laryngeal closure, and diffuse residue resulting in penetration of nectar-thick liquids and aspiration of thin liquid consistencies.  Puree and soft solid consistencies also resulted in moderate pharyngeal residue that patient managed with multiple swallows and intermittent throat clears.  Recommend initiation of a Dys.2 texture diet with nectar-thick liquids via straw.  Consider Free Water Protocol given minimal aspiration.  SLP will follow. SLP initiated education regarding consistency of nectar-thick liquids, how to thicken liquids and prepare Dys.2/minced solids.  Will continue in next session.   Impact on safety and function Mild aspiration risk   [facility] IP TREATMENT RECOMMENDATION 01/02/2016 Treatment Recommendations Other (Comment)   Prognosis 01/02/2016 Prognosis for Safe  Diet Advancement Good Barriers to Reach Goals Other (Comment) Barriers/Prognosis Comment -- [facility] IP DIET RECOMMENDATION 01/02/2016 SLP Diet Recommendations Dysphagia 2 (Fine chop) solids;Nectar thick liquid Liquid Administration via Straw Medication Administration Crushed with puree Compensations Slow rate;Small sips/bites Postural Changes --   [facility] IP OTHER RECOMMENDATIONS 01/02/2016 Recommended Consults -- Oral Care Recommendations Oral care BID Other Recommendations Order thickener from pharmacy;Prohibited food (jello, ice cream, thin soups);Remove water pitcher;Have oral suction available   [facility] IP FOLLOW UP RECOMMENDATIONS 01/02/2016 Follow up Recommendations Outpatient SLP   No flowsheet data found.     [facility] IP ORAL PHASE 01/02/2016 Oral Phase WFL Oral - Pudding Teaspoon -- Oral - Pudding Cup -- Oral - Honey Teaspoon -- Oral - Honey Cup -- Oral - Nectar Teaspoon -- Oral - Nectar Cup -- Oral - Nectar Straw -- Oral - Thin Teaspoon -- Oral - Thin Cup -- Oral - Thin Straw -- Oral - Puree -- Oral - Mech Soft -- Oral - Regular -- Oral - Multi-Consistency -- Oral - Pill -- Oral Phase - Comment --  [facility] IP PHARYNGEAL PHASE 01/02/2016 Pharyngeal Phase Impaired Pharyngeal- Pudding Teaspoon -- Pharyngeal -- Pharyngeal- Pudding Cup -- Pharyngeal -- Pharyngeal- Honey Teaspoon -- Pharyngeal -- Pharyngeal- Honey Cup -- Pharyngeal -- Pharyngeal- Nectar Teaspoon -- Pharyngeal -- Pharyngeal- Nectar Cup -- Pharyngeal -- Pharyngeal- Nectar Straw Reduced epiglottic inversion;Reduced pharyngeal peristalsis;Reduced anterior laryngeal mobility;Reduced laryngeal elevation;Reduced airway/laryngeal closure;Penetration/Apiration after swallow;Pharyngeal residue - valleculae;Pharyngeal residue - pyriform Pharyngeal Material enters airway, remains ABOVE vocal cords then ejected out Pharyngeal- Thin Teaspoon -- Pharyngeal -- Pharyngeal- Thin Cup -- Pharyngeal -- Pharyngeal- Thin Straw Penetration/Aspiration during swallow;Reduced airway/laryngeal  closure;Reduced laryngeal elevation;Reduced anterior laryngeal mobility;Reduced epiglottic inversion;Reduced pharyngeal peristalsis;Pharyngeal residue - valleculae;Pharyngeal residue - pyriform;Trace aspiration Pharyngeal Material enters airway, passes BELOW cords and not ejected out despite cough attempt by patient Pharyngeal- Puree Reduced pharyngeal peristalsis;Reduced airway/laryngeal closure;Reduced laryngeal elevation;Reduced anterior laryngeal mobility;Reduced epiglottic inversion;Pharyngeal residue - valleculae;Pharyngeal residue - pyriform Pharyngeal Material does not enter airway Pharyngeal- Mechanical Soft Reduced pharyngeal peristalsis;Reduced epiglottic inversion;Reduced anterior laryngeal mobility;Reduced laryngeal elevation;Reduced airway/laryngeal closure;Pharyngeal residue - valleculae;Pharyngeal residue - pyriform Pharyngeal Material does not enter airway Pharyngeal- Regular -- Pharyngeal -- Pharyngeal- Multi-consistency -- Pharyngeal -- Pharyngeal- Pill -- Pharyngeal -- Pharyngeal Comment --  [facility] IP CERVICAL ESOPHAGEAL PHASE 01/02/2016 Cervical Esophageal Phase Impaired Pudding Teaspoon -- Pudding Cup -- Honey Teaspoon -- Honey Cup -- Nectar Teaspoon -- Nectar Cup -- Nectar Straw -- Thin Teaspoon -- Thin Cup -- Thin Straw -- Puree -- Mechanical Soft -- Regular -- Multi-consistency -- Pill -- Cervical Esophageal Comment increased clearance as compred to previous study  No flowsheet data found. Fae Pippin, M.A., CCC-SLP [phone] BOWIE,MELISSA 01/02/2016, 10:22 AM               Recent Labs  01/02/16 0419  WBC 6.1  HGB 13.1  HCT 40.3  PLT 368    Recent Labs  01/04/16 0540  NA 143  K 4.3  CL 106  GLUCOSE 173*  BUN 10  CREATININE 0.96  CALCIUM 9.6   CBG (last 3)   Recent Labs  01/03/16 1643 01/03/16 2057 01/04/16 0704  GLUCAP 369* 100* 234*    Wt Readings from Last 3 Encounters:  01/04/16 98.567 kg (217 lb 4.8 oz)  12/24/15 105.5 kg (232 lb 9.4 oz)  12/05/15 108.727  kg (239 lb 11.2 oz)    Physical Exam:  HEENT NGT. Staples in neck, incision cdi General: No acute distress Mood and affect are appropriate Heart:  Regular rate and rhythm no rubs murmurs or rubs Lungs: Clear to auscultation, normal effort.  no rales or wheezes Abdomen: Positive bowel sounds, soft nontender to palpation, nondistended Extremities: No clubbing, cyanosis, or edema Skin: No evidence of breakdown, no evidence of rash Neurologic: Cranial nerves II through XII intact, motor strength is 1+ to 2-/5 in bilateral deltoid, 3+ tricep, 4 grip,4+ hip flexor, knee extensors, ankle dorsiflexor and plantar flexor--stable Sensory exam normal sensation to light touch in bilateral upper and lower extremities Cerebellar exam normal finger to nose to finger as well as heel to shin in bilateral upper and lower extremities Musculoskeletal: Full range of motion in all 4 extremities. No joint swelling, Mild dorsum of the hand swelling/edema, nontender no erythema Deep tendon reflexes are hyporeflexic bilateral upper and lower limbs Cognitively he's appropriate Psych: pleasant and appropriate    Assessment/Plan: 1. Weakness and mobility/functional deficits secondary to cervical myelopathy/C5 radiculopathy which require 3+ hours per day of interdisciplinary therapy in a comprehensive inpatient rehab setting. Physiatrist is providing close team supervision and 24 hour management of active medical problems listed below. Physiatrist and rehab team continue to assess barriers to discharge/monitor patient progress toward functional and medical goals.  Function:  Bathing Bathing position   Position: Shower  Bathing parts Body parts bathed by patient: Right arm, Left arm, Chest, Abdomen, Front perineal area, Right upper leg, Left upper leg, Right lower leg, Left lower leg, Buttocks Body parts bathed by helper: Back, Buttocks  Bathing assist Assist Level: More than reasonable time      Upper Body  Dressing/Undressing Upper body dressing   What is the patient wearing?: Pull over shirt/dress     Pull over shirt/dress - Perfomed by patient: Thread/unthread right sleeve, Thread/unthread left sleeve, Pull shirt over trunk, Put head through opening Pull over shirt/dress - Perfomed by helper: Put head through opening        Upper body assist Assist Level: Supervision or verbal cues, Set up   Set up : To obtain clothing/put away  Lower Body Dressing/Undressing Lower body dressing   What is the patient wearing?: Underwear, Pants, Non-skid slipper socks Underwear - Performed by patient: Thread/unthread right underwear leg, Pull underwear up/down, Thread/unthread left underwear leg Underwear - Performed by helper: Pull underwear up/down Pants- Performed by patient: Thread/unthread right pants leg, Thread/unthread left pants leg, Pull pants up/down Pants- Performed by helper: Pull pants up/down Non-skid slipper socks- Performed by patient: Don/doff right sock, Don/doff left sock                    Lower body assist Assist for lower body dressing: More than reasonable time      Toileting Toileting   Toileting steps completed by patient: Adjust clothing prior to toileting, Performs perineal hygiene, Adjust clothing after toileting Toileting steps completed by helper: Adjust clothing after toileting Toileting Assistive Devices: Grab bar or rail  Toileting assist Assist level: More than reasonable time   Transfers Chair/bed transfer   Chair/bed transfer method: Ambulatory Chair/bed transfer assist level: No help, no cues Chair/bed transfer assistive device: Patent attorney     Max distance: >300 ft Assist level: No help, No cues, assistive device, takes more than a reasonable amount of time   Wheelchair          Cognition Comprehension Comprehension assist level: Follows complex conversation/direction with no assist  Expression Expression assist  level: Expresses complex ideas: With no assist  Furniture conservator/restorer  assist level: Interacts appropriately with others with medication or extra time (anti-anxiety, antidepressant).  Problem Solving Problem solving assist level: Solves complex problems: With extra time  Memory Memory assist level: Recognizes or recalls 90% of the time/requires cueing < 10% of the time  Medical Problem List and Plan: 1. Cervical myelopathy and bilateral C5 radiculopathy secondary to Cervical spinal stenosis and postoperative abscess  -dc home with hh  -Patient to see me in the office for transitional care encounter in 1-2 weeks. 2. DVT Prophylaxis/Anticoagulation: Pharmaceutical: Heparin indicated 3. Pain Management:  oxycodone 1 mg per mL, 5-10 mL every 4 hours when necessary 4. Mood: Overt signs of depression, will monitor for adjustment disorder, neuropsychologist evaluation as necessary 5. Neuropsych: This patient iscapable of making decisions on his own behalf. 6. Skin/Wound Care: monitor wound for healing 7. Fluids/Electrolytes/Nutrition:   -on D2 diet 8. HTN: Monitor BP bid.  9. DMT2: Monitor BS ac/hs. Use SSI   - Increased lantus  to 35 u qhs and added 10u qam as well  -will not make changes prior to dc. Likely will need further adjustment at home 10. Neck abscess: To continue Vanc  and Ceftriaxone 2 grams daily until April 27th-  -HH IV abx 11. Dysphagia with pharyngeal edema--passed MBS for D2/nectars    LOS (Days) 8 A FACE TO FACE EVALUATION WAS PERFORMED  SWARTZ,ZACHARY T 01/04/2016 8:21 AM

## 2016-01-04 NOTE — Progress Notes (Signed)
Patient discharged to home with family.

## 2016-01-04 NOTE — Progress Notes (Signed)
Social Work Discharge Note  The overall goal for the admission was met for:   Discharge location: [place]  Length of Stay: Yes-8 DAYS  Discharge activity level: Yes-MOD/I-SUPERVISION LEVEL  Home/community participation: Yes  Services provided included: MD, RD, PT, OT, SLP, RN, CM, Pharmacy and [place]: Medicaid  Follow-up services arranged: Home Health: [facility] CARE-PT,OT,RN, DME: [facility] and Patient/Family has no preference for HH/DME agencies  Comments (or additional information):  Patient/Family verbalized understanding of follow-up arrangements: Yes  Individual responsible for coordination of the follow-up plan: SELF & TRISH  Confirmed correct DME delivered: Elease Hashimoto 01/04/2016    Elease Hashimoto

## 2016-01-08 ENCOUNTER — Telehealth: Payer: Self-pay | Admitting: *Deleted

## 2016-01-08 ENCOUNTER — Telehealth: Payer: Self-pay

## 2016-01-08 ENCOUNTER — Telehealth: Payer: Self-pay | Admitting: Physical Medicine & Rehabilitation

## 2016-01-08 NOTE — Telephone Encounter (Signed)
Patient said his headaches were a result of not having any pain medication, i.e. Oxycodone since he left the hospital. His F/U is next week.

## 2016-01-08 NOTE — Telephone Encounter (Signed)
Spoke with patient  1. Are you/is patient experiencing any problems since coming home? Are there any questions regarding any aspect of care? 2. Are there any questions regarding medications administration/dosing? Are meds being taken as prescribed? Patient did not get muscle relaxer or pain medication  3. Have there been any falls? No  4. Has Home Health been to the house and/or have they contacted you? If not, have you tried to contact them? Can we help you contact them? Nurses coming out but no therapist yet 5. Are bowels and bladder emptying properly? Are there any unexpected incontinence issues? If applicable, is patient following bowel/bladder programs? No 6. Any fevers, problems with breathing, unexpected pain? No. Arm hurts from therapy 7. Are there any skin problems or new areas of breakdown? No 8. Has the patient/family member arranged specialty MD follow up (ie cardiology/neurology/renal/surgical/etc)?  Can we help arrange? 9. Does the patient need any other services or support that we can help arrange? Check on therapist 10. Are caregivers following through as expected in assisting the patient? Fiance is there to help him but it is tiring  Patient having a good amount of headaches. Pt. Been without pain medication since he left the hospital. Called surgeon but has not heard anything back.  Pharmacy- [facility] [address] [place] muscle relaxer  F/U appointment scheduled on 01-16-16 at 3:15pm.

## 2016-01-08 NOTE — Telephone Encounter (Signed)
Getting information to give Dr Hollace HaywardPlonk concerning hx of diabetes

## 2016-01-08 NOTE — Telephone Encounter (Signed)
i'm not aware that he was needing anything substantial for pain

## 2016-01-08 NOTE — Telephone Encounter (Signed)
Call placed to William [name] with [facility] regarding a lab result with a critical glucose of 572. She will notify William [name], and he will either page Dr. Luciana Axeomer or contact the patient. Patient has a hospital follow up with Dr. Daiva EvesVan Dam on 01/28/16. Wendall MolaJacqueline Nilton Lave

## 2016-01-09 MED ORDER — OXYCODONE HCL 5 MG PO TABS: 5.0000 mg | ORAL_TABLET | Freq: Four times a day (QID) | ORAL | Status: DC | PRN

## 2016-01-09 NOTE — Telephone Encounter (Signed)
i would be willing to give him a few oxycodone to help make it to his appt here. Order written

## 2016-01-09 NOTE — Telephone Encounter (Signed)
Called patient and told him the script would be waiting for him upfront and that he has been given enough to get to his appointment.

## 2016-01-16 ENCOUNTER — Encounter: Payer: Medicaid Other | Admitting: Physical Medicine & Rehabilitation

## 2016-01-16 DIAGNOSIS — M4802 Spinal stenosis, cervical region: Secondary | ICD-10-CM | POA: Diagnosis not present

## 2016-01-16 DIAGNOSIS — M5412 Radiculopathy, cervical region: Secondary | ICD-10-CM | POA: Diagnosis not present

## 2016-01-16 DIAGNOSIS — T814XXA Infection following a procedure, initial encounter: Secondary | ICD-10-CM | POA: Diagnosis not present

## 2016-01-16 DIAGNOSIS — M5000 Cervical disc disorder with myelopathy, unspecified cervical region: Secondary | ICD-10-CM | POA: Diagnosis not present

## 2016-01-17 ENCOUNTER — Ambulatory Visit: Payer: Medicaid Other | Admitting: Family Medicine

## 2016-01-21 NOTE — Progress Notes (Signed)
Coding Query: Pt's diagnosis is incomplete cervical myelopathy with bilateral C5 radiculopathy

## 2016-01-23 ENCOUNTER — Ambulatory Visit: Payer: Medicaid Other | Admitting: Urology

## 2016-01-28 ENCOUNTER — Encounter: Payer: Self-pay | Admitting: Infectious Disease

## 2016-01-28 ENCOUNTER — Ambulatory Visit (INDEPENDENT_AMBULATORY_CARE_PROVIDER_SITE_OTHER): Payer: Medicaid Other | Admitting: Infectious Disease

## 2016-01-28 VITALS — BP 135/90 | HR 120 | Temp 98.4°F | Ht 70.0 in | Wt 228.0 lb

## 2016-01-28 DIAGNOSIS — IMO0002 Reserved for concepts with insufficient information to code with codable children: Secondary | ICD-10-CM

## 2016-01-28 DIAGNOSIS — T847XXD Infection and inflammatory reaction due to other internal orthopedic prosthetic devices, implants and grafts, subsequent encounter: Secondary | ICD-10-CM | POA: Diagnosis not present

## 2016-01-28 DIAGNOSIS — B182 Chronic viral hepatitis C: Secondary | ICD-10-CM | POA: Diagnosis not present

## 2016-01-28 DIAGNOSIS — T847XXA Infection and inflammatory reaction due to other internal orthopedic prosthetic devices, implants and grafts, initial encounter: Secondary | ICD-10-CM | POA: Insufficient documentation

## 2016-01-28 DIAGNOSIS — E1165 Type 2 diabetes mellitus with hyperglycemia: Secondary | ICD-10-CM

## 2016-01-28 DIAGNOSIS — J390 Retropharyngeal and parapharyngeal abscess: Secondary | ICD-10-CM | POA: Diagnosis not present

## 2016-01-28 DIAGNOSIS — G061 Intraspinal abscess and granuloma: Secondary | ICD-10-CM

## 2016-01-28 DIAGNOSIS — E118 Type 2 diabetes mellitus with unspecified complications: Secondary | ICD-10-CM

## 2016-01-28 DIAGNOSIS — Z794 Long term (current) use of insulin: Secondary | ICD-10-CM

## 2016-01-28 DIAGNOSIS — L0211 Cutaneous abscess of neck: Secondary | ICD-10-CM | POA: Diagnosis present

## 2016-01-28 DIAGNOSIS — T814XXD Infection following a procedure, subsequent encounter: Secondary | ICD-10-CM | POA: Diagnosis not present

## 2016-01-28 DIAGNOSIS — IMO0001 Reserved for inherently not codable concepts without codable children: Secondary | ICD-10-CM

## 2016-01-28 HISTORY — DX: Intraspinal abscess and granuloma: G06.1

## 2016-01-28 HISTORY — DX: Infection and inflammatory reaction due to other internal orthopedic prosthetic devices, implants and grafts, initial encounter: T84.7XXA

## 2016-01-28 HISTORY — DX: Retropharyngeal and parapharyngeal abscess: J39.0

## 2016-01-28 MED ORDER — SULFAMETHOXAZOLE-TRIMETHOPRIM 800-160 MG PO TABS
1.0000 | ORAL_TABLET | Freq: Two times a day (BID) | ORAL | Status: AC
Start: 2016-01-28 — End: ?

## 2016-01-28 NOTE — Progress Notes (Signed)
Subjective:   Chief complaint: difficulty raising arms that has persisted since before his first surgery   Patient ID: William [name], male    DOB: 1956/05/06, 60 y.o.   MRN: [id number]  HPI  60 year old African American man with history of severe stenosis at C3-4, 4-5 and 5-6 who underwent multilevel discectomy and fusion who presented with C5 radiculopathy redness swelling at operative site, sent to the ED where CT concerning for operative site to retropharyngeal space abscess, taken to the OR by Dr. [name] on 12/20/15 after vancomycin and zosyn were started.   Dr. [name] performed : Exploration of the cervical wound drain. Drainage of a large collection of fluid. Inspection of the cervical plate. Inspection of the esophagus. Irrigation of the esophagus with methylene blue.   Operative cultures failed to yield an organism. He was seen by Dr. Luciana Axe and narrowed to vancomycin and ceftriaxone and en course to complete IV abx on April 26th.   His neck pain has improved dramatically. He still cannot raise his arms but no new Neuro findings.   Past Medical History  Diagnosis Date  . Hypertension   . Diabetes mellitus without complication (HCC)   . Chronic hepatitis C without hepatic coma (HCC)   . History of cocaine use   . Depression   . Arthritis   . History of TIA (transient ischemic attack)   . Hyperlipidemia   . History of migraine headaches   . Obesity, Class II, BMI 35-39.9   . BPH with obstruction/lower urinary tract symptoms   . Balanitis   . Urinary retention   . Tobacco abuse   . Abscess in epidural space of cervical spine 01/28/2016    Past Surgical History  Procedure Laterality Date  . Rotator  cuff surgery       left shoulder   . Spider bite      on neck  . Anterior cervical decomp/discectomy fusion N/A 12/05/2015    Procedure: Anterior Cervical Decompression Fusion Cervical three-four, Cervical four-five, Cervical five-six, Cervical six-seven;  Surgeon: Julio Sicks, MD;  Location: [facility];  Service: Neurosurgery;  Laterality: N/A;  . Anterior cervical decompression for epidural abscess N/A 12/20/2015    Procedure: ANTERIOR CERVICAL DECOMPRESSION FOR EPIDURAL ABSCESS;  Surgeon: Hilda Lias, MD;  Location: [facility];  Service: Neurosurgery;  Laterality: N/A;  ANTERIOR CERVICAL DECOMPRESSION FOR EPIDURAL ABSCESS    Family History  Problem Relation Age of Onset  . Hypertension Mother   . Diabetes Mother   . Heart disease Mother   . Thyroid disease Mother   . Hypertension Father   . Diabetes Father   . Heart disease Father       Social History   Social History  . Marital Status: Divorced    Spouse Name: N/A  . Number of Children: N/A  . Years of Education: N/A   Social History Main Topics  . Smoking status: Current Every Day Smoker -- 0.10 packs/day for 30 years    Types: Cigarettes    Last Attempt to Quit: 09/27/2014  . Smokeless tobacco: None  . Alcohol Use: 0.0 oz/week    0 Standard drinks or equivalent per week  . Drug Use: Yes     Comment: history of drug use, admitted 04/14/13 for cocaine use   . Sexual Activity:    Partners: Female    Pharmacist, hospital Protection: Condom   Other Topics Concern  . None   Social History Narrative    Allergies  Allergen Reactions  . Metformin And Related Other (See Comments)    Possible liver inflammation  . Prednisone Other (See Comments)    Disoriented   . Tamsulosin Other (See Comments)    Nose bleed  . Other Other (See Comments)    Reports all narcotics make him sick. Pt premeds with Reglan to stop nausea     Current outpatient prescriptions:  .  amLODipine (NORVASC) 10 MG tablet, Take 1 tablet (10 mg total) by mouth daily., Disp: 30 tablet, Rfl: 3 .  BD PEN NEEDLE NANO U/F 32G X 4 MM MISC, USE WITH FLEXPEN TO ADMINISTER INSULIN, Disp: 100 each, Rfl: 5 .  cefTRIAXone 2 g in dextrose 5 % 50 mL, Inject 2 g into the vein daily., Disp: , Rfl:  .  Elastic Bandages & Supports  (THUMB BRACE) MISC, 1 Device by Does not apply route daily as needed., Disp: 1 each, Rfl: 0 .  gabapentin (NEURONTIN) 100 MG capsule, Take 1 capsule (100 mg total) by mouth 3 (three) times daily., Disp: 90 capsule, Rfl: 3 .  glucose blood (ACCU-CHEK AVIVA) test strip, Use as instructed to check blood glucose 4x/day, Disp: 200 each, Rfl: 12 .  insulin aspart (NOVOLOG FLEXPEN) 100 UNIT/ML FlexPen, Inject 15 Units into the skin 3 (three) times daily with meals. (Patient taking differently: Inject 25 Units into the skin 3 (three) times daily with meals. ), Disp: 8 pen, Rfl: 5 .  insulin glargine (LANTUS) 100 UNIT/ML injection, Inject 0.35 mLs (35 Units total) into the skin at bedtime., Disp: 10 mL, Rfl: 11 .  insulin glargine (LANTUS) 100 UNIT/ML injection, Inject 0.1 mLs (10 Units total) into the skin daily., Disp: 10 mL, Rfl: 11 .  Insulin Pen Needle (EASY TOUCH PEN NEEDLES) 32G X 6 MM MISC, Use with Flexpen to administer Insulin, Disp: 100 each, Rfl: 11 .  Lancets (FREESTYLE) lancets, Use as instructed to check blood glucose 4x/day, Disp: 200 each, Rfl: 12 .  oxyCODONE (OXY IR/ROXICODONE) 5 MG immediate release tablet, Take 1 tablet (5 mg total) by mouth every 6 (six) hours as needed for severe pain., Disp: 30 tablet, Rfl: 0 .  pantoprazole (PROTONIX) 40 MG tablet, Take 1 tablet (40 mg total) by mouth 2 (two) times daily. Twice a day for 10 days; then start using it once a day, Disp: 60 tablet, Rfl: 1 .  vancomycin 1,250 mg in sodium chloride 0.9 % 250 mL, Inject 1,250 mg into the vein every 12 (twelve) hours., Disp: , Rfl:  .  sulfamethoxazole-trimethoprim (BACTRIM DS,SEPTRA DS) 800-160 MG tablet, Take 1 tablet by mouth 2 (two) times daily., Disp: 60 tablet, Rfl: 11    Review of Systems  Constitutional: Negative for fever, chills, diaphoresis, activity change, appetite change, fatigue and unexpected weight change.  HENT: Negative for congestion, rhinorrhea, sinus pressure, sneezing, sore throat and  trouble swallowing.   Eyes: Negative for photophobia and visual disturbance.  Respiratory: Negative for cough, chest tightness, shortness of breath, wheezing and stridor.   Cardiovascular: Negative for chest pain, palpitations and leg swelling.  Gastrointestinal: Negative for nausea, vomiting, abdominal pain, diarrhea, constipation, blood in stool, abdominal distention and anal bleeding.  Genitourinary: Negative for dysuria, hematuria, flank pain and difficulty urinating.  Musculoskeletal: Negative for myalgias, back pain, joint swelling, arthralgias and gait problem.  Skin: Negative for color change, pallor, rash and wound.  Neurological: Positive for weakness. Negative for dizziness, tremors and light-headedness.  Hematological: Negative for adenopathy. Does not bruise/bleed easily.  Psychiatric/Behavioral: Negative for  behavioral problems, confusion, sleep disturbance, dysphoric mood, decreased concentration and agitation.       Objective:   Physical Exam  Constitutional: He is oriented to person, place, and time. He appears well-developed and well-nourished.  HENT:  Head: Normocephalic and atraumatic.  Eyes: Conjunctivae and EOM are normal.  Neck: Normal range of motion. Neck supple.    Cardiovascular: Normal rate and regular rhythm.   Pulmonary/Chest: Effort normal. No respiratory distress. He has no wheezes.  Abdominal: Soft. He exhibits no distension. There is no tenderness. There is no rebound and no guarding.  Musculoskeletal: Normal range of motion. He exhibits no edema or tenderness.  Neurological: He is alert and oriented to person, place, and time.  Limited ability to abduct arms elevate them to raise hand to grip mine to shake hands  Skin: Skin is warm and dry. No rash noted. No erythema.     Psychiatric: He has a normal mood and affect. His behavior is normal. Judgment and thought content normal.          Assessment & Plan:   Post-operative abscess in cervical  spine area complicated by presence of hardware and extending to retropharyngeal space sp I and D and now IV abx  --finish IV abx --start Bactrim DS BID for minimum of a year if not even longer  Chronic hepatitis C without hepatic coma: He denies IVDU. He denies any recreational drug use, He does not drink etoh. He quit smoking about 2 months ago. He believes he may have contracted HCV from prior girlfriend. He has not had a blood transfusion before.  --check NS5A, HCV genotype, HCV RNA quant, Fibrosure and Elastography  I spent greater than 40 minutes with the patient including greater than 50% of time in face to face counsel of the patient's cervical spine abscess with hardware, chronic hepatitis c without hepatic comat and in coordination of his care.

## 2016-01-29 ENCOUNTER — Telehealth: Payer: Self-pay

## 2016-01-29 ENCOUNTER — Telehealth: Payer: Self-pay | Admitting: *Deleted

## 2016-01-29 LAB — HEPATITIS C RNA QUANTITATIVE
HCV QUANT LOG: 7.08 {Log} — AB (ref <1.18)
HCV QUANT: 12114438 [IU]/mL — AB (ref <15)

## 2016-01-29 NOTE — Telephone Encounter (Signed)
Yes fine to pull the PICC and he needs to start Bactrim DS BID.

## 2016-01-29 NOTE — Telephone Encounter (Signed)
Called [facility] pharmacy and gave Melissa Dr. Zenaida NieceVan Dam's verbal order to pull the PICC line due to patient stopping IV antibiotics and starting Bactrim DS twice a day. Rejeana Brockandace Murray, LPN  Called patient to notify him that orders were called in to [facility] and that the nurse will be coming out to pull the PICC line today or tomorrow. Reminded patient that he is to start taking the Bactrim DS twice a day as directed by Dr. Daiva EvesVan Dam. Patient stated understanding and repeated order back to nurse. Candace Murray,LPN

## 2016-01-29 NOTE — Telephone Encounter (Signed)
[facility], patient states he is done with IV antibiotics. OK to pull picc?  Andree CossHowell, Carver Murakami M, RN

## 2016-01-30 LAB — LIVER FIBROSIS, FIBROTEST-ACTITEST
ALPHA-2-MACROGLOBULIN: 529 mg/dL — AB (ref 106–279)
ALT: 92 U/L — AB (ref 9–46)
Apolipoprotein A1: 103 mg/dL (ref 94–176)
Bilirubin: 0.3 mg/dL (ref 0.2–1.2)
FIBROSIS SCORE: 0.76
GGT: 52 U/L (ref 3–70)
HAPTOGLOBIN: 166 mg/dL (ref 43–212)
NECROINFLAMMAT ACT SCORE: 0.7
Reference ID: 1502878

## 2016-01-31 ENCOUNTER — Telehealth: Payer: Self-pay

## 2016-01-31 LAB — HEPATITIS C GENOTYPE

## 2016-01-31 NOTE — Telephone Encounter (Signed)
Called patient to give him appointment information. Elastography scheduled at [facility] 05/03 at 9:30am. Patient is to arrive by 9:15a and to have nothing by mouth after midnight. Patient was driving at the time and could not take information down. Patient stated he will call back to office once home. Rejeana Brockandace Janzen Sacks, LPN

## 2016-02-02 LAB — HCV RNA NS5A DRUG RESISTANCE

## 2016-02-05 ENCOUNTER — Other Ambulatory Visit: Payer: Self-pay | Admitting: Pharmacist Clinician (PhC)/ Clinical Pharmacy Specialist

## 2016-02-05 ENCOUNTER — Telehealth: Payer: Self-pay

## 2016-02-05 MED ORDER — ELBASVIR-GRAZOPREVIR 50-100 MG PO TABS: 1.0000 | ORAL_TABLET | Freq: Every day | ORAL | Status: AC

## 2016-02-05 NOTE — Telephone Encounter (Signed)
Called to verify that patient had appointment information for elastography. Patient stated that he did not. Gave patient time, location, and instructions for procedures. Patient stated that he could make appointment tomorrow (5/3). Rejeana Brockandace Caelie [place], LPN

## 2016-02-06 ENCOUNTER — Encounter: Payer: Medicaid Other | Attending: Physical Medicine & Rehabilitation | Admitting: Physical Medicine & Rehabilitation

## 2016-02-06 ENCOUNTER — Ambulatory Visit
Admission: RE | Admit: 2016-02-06 | Discharge: 2016-02-06 | Disposition: A | Discharge status: Home / Self Care | Payer: Medicaid Other | Source: Ambulatory Visit | Attending: Infectious Disease | Admitting: Infectious Disease

## 2016-02-06 DIAGNOSIS — R202 Paresthesia of skin: Secondary | ICD-10-CM | POA: Insufficient documentation

## 2016-02-06 DIAGNOSIS — M50022 Cervical disc disorder at C5-C6 level with myelopathy: Secondary | ICD-10-CM | POA: Insufficient documentation

## 2016-02-06 DIAGNOSIS — R1310 Dysphagia, unspecified: Secondary | ICD-10-CM | POA: Insufficient documentation

## 2016-02-06 DIAGNOSIS — Z9889 Other specified postprocedural states: Secondary | ICD-10-CM | POA: Insufficient documentation

## 2016-02-06 DIAGNOSIS — X58XXXA Exposure to other specified factors, initial encounter: Secondary | ICD-10-CM | POA: Insufficient documentation

## 2016-02-06 DIAGNOSIS — K760 Fatty (change of) liver, not elsewhere classified: Secondary | ICD-10-CM | POA: Diagnosis not present

## 2016-02-06 DIAGNOSIS — Z8673 Personal history of transient ischemic attack (TIA), and cerebral infarction without residual deficits: Secondary | ICD-10-CM | POA: Insufficient documentation

## 2016-02-06 DIAGNOSIS — E669 Obesity, unspecified: Secondary | ICD-10-CM | POA: Insufficient documentation

## 2016-02-06 DIAGNOSIS — F1421 Cocaine dependence, in remission: Secondary | ICD-10-CM | POA: Insufficient documentation

## 2016-02-06 DIAGNOSIS — G8929 Other chronic pain: Secondary | ICD-10-CM | POA: Insufficient documentation

## 2016-02-06 DIAGNOSIS — E785 Hyperlipidemia, unspecified: Secondary | ICD-10-CM | POA: Insufficient documentation

## 2016-02-06 DIAGNOSIS — B182 Chronic viral hepatitis C: Secondary | ICD-10-CM | POA: Insufficient documentation

## 2016-02-06 DIAGNOSIS — I10 Essential (primary) hypertension: Secondary | ICD-10-CM | POA: Insufficient documentation

## 2016-02-06 DIAGNOSIS — M4802 Spinal stenosis, cervical region: Secondary | ICD-10-CM | POA: Insufficient documentation

## 2016-02-06 DIAGNOSIS — E119 Type 2 diabetes mellitus without complications: Secondary | ICD-10-CM | POA: Insufficient documentation

## 2016-02-06 DIAGNOSIS — N281 Cyst of kidney, acquired: Secondary | ICD-10-CM | POA: Diagnosis not present

## 2016-02-06 DIAGNOSIS — Z6835 Body mass index (BMI) 35.0-35.9, adult: Secondary | ICD-10-CM | POA: Insufficient documentation

## 2016-02-06 DIAGNOSIS — M1990 Unspecified osteoarthritis, unspecified site: Secondary | ICD-10-CM | POA: Insufficient documentation

## 2016-02-06 DIAGNOSIS — L0211 Cutaneous abscess of neck: Secondary | ICD-10-CM | POA: Insufficient documentation

## 2016-02-06 DIAGNOSIS — Z79899 Other long term (current) drug therapy: Secondary | ICD-10-CM | POA: Insufficient documentation

## 2016-02-06 DIAGNOSIS — F17210 Nicotine dependence, cigarettes, uncomplicated: Secondary | ICD-10-CM | POA: Insufficient documentation

## 2016-02-06 DIAGNOSIS — T814XXA Infection following a procedure, initial encounter: Secondary | ICD-10-CM | POA: Insufficient documentation

## 2016-02-07 ENCOUNTER — Telehealth: Payer: Self-pay | Admitting: *Deleted

## 2016-02-07 MED FILL — ZEPATIER 50-100 MG TABLET: 50-100 | 28 days supply | Qty: 28 | Fill #0

## 2016-02-07 NOTE — Telephone Encounter (Signed)
Patient calling for ultrasound results. Please advise. William [name], [name], RN

## 2016-02-08 ENCOUNTER — Encounter: Payer: Self-pay | Admitting: Internal Medicine

## 2016-02-08 ENCOUNTER — Encounter: Payer: Self-pay | Admitting: Infectious Disease

## 2016-02-08 NOTE — Telephone Encounter (Signed)
I asked mInh for rec of which anti HCV we should give based on his insurance. Harvoni maybe easiest to get covered?

## 2016-02-11 ENCOUNTER — Encounter: Payer: Self-pay | Admitting: Pharmacy Technician

## 2016-02-12 ENCOUNTER — Encounter: Payer: Self-pay | Admitting: Physical Medicine & Rehabilitation

## 2016-02-12 ENCOUNTER — Encounter (HOSPITAL_BASED_OUTPATIENT_CLINIC_OR_DEPARTMENT_OTHER): Payer: Medicaid Other | Admitting: Physical Medicine & Rehabilitation

## 2016-02-12 VITALS — BP 137/94 | HR 114 | Resp 16

## 2016-02-12 DIAGNOSIS — F17210 Nicotine dependence, cigarettes, uncomplicated: Secondary | ICD-10-CM | POA: Diagnosis not present

## 2016-02-12 DIAGNOSIS — Z6835 Body mass index (BMI) 35.0-35.9, adult: Secondary | ICD-10-CM | POA: Diagnosis not present

## 2016-02-12 DIAGNOSIS — Z5181 Encounter for therapeutic drug level monitoring: Secondary | ICD-10-CM

## 2016-02-12 DIAGNOSIS — I10 Essential (primary) hypertension: Secondary | ICD-10-CM

## 2016-02-12 DIAGNOSIS — E669 Obesity, unspecified: Secondary | ICD-10-CM | POA: Diagnosis not present

## 2016-02-12 DIAGNOSIS — M868X8 Other osteomyelitis, other site: Secondary | ICD-10-CM | POA: Diagnosis not present

## 2016-02-12 DIAGNOSIS — L0211 Cutaneous abscess of neck: Secondary | ICD-10-CM | POA: Diagnosis not present

## 2016-02-12 DIAGNOSIS — Z79899 Other long term (current) drug therapy: Secondary | ICD-10-CM

## 2016-02-12 DIAGNOSIS — M1990 Unspecified osteoarthritis, unspecified site: Secondary | ICD-10-CM | POA: Diagnosis not present

## 2016-02-12 DIAGNOSIS — E119 Type 2 diabetes mellitus without complications: Secondary | ICD-10-CM | POA: Diagnosis not present

## 2016-02-12 DIAGNOSIS — T814XXA Infection following a procedure, initial encounter: Secondary | ICD-10-CM | POA: Diagnosis not present

## 2016-02-12 DIAGNOSIS — E785 Hyperlipidemia, unspecified: Secondary | ICD-10-CM | POA: Diagnosis not present

## 2016-02-12 DIAGNOSIS — B182 Chronic viral hepatitis C: Secondary | ICD-10-CM | POA: Diagnosis not present

## 2016-02-12 DIAGNOSIS — G894 Chronic pain syndrome: Secondary | ICD-10-CM

## 2016-02-12 DIAGNOSIS — G8929 Other chronic pain: Secondary | ICD-10-CM | POA: Diagnosis not present

## 2016-02-12 DIAGNOSIS — X58XXXA Exposure to other specified factors, initial encounter: Secondary | ICD-10-CM | POA: Diagnosis not present

## 2016-02-12 DIAGNOSIS — Z9889 Other specified postprocedural states: Secondary | ICD-10-CM | POA: Diagnosis not present

## 2016-02-12 DIAGNOSIS — M5412 Radiculopathy, cervical region: Secondary | ICD-10-CM | POA: Diagnosis not present

## 2016-02-12 DIAGNOSIS — E1151 Type 2 diabetes mellitus with diabetic peripheral angiopathy without gangrene: Secondary | ICD-10-CM

## 2016-02-12 DIAGNOSIS — M50022 Cervical disc disorder at C5-C6 level with myelopathy: Secondary | ICD-10-CM | POA: Diagnosis present

## 2016-02-12 DIAGNOSIS — F1421 Cocaine dependence, in remission: Secondary | ICD-10-CM | POA: Diagnosis not present

## 2016-02-12 DIAGNOSIS — R202 Paresthesia of skin: Secondary | ICD-10-CM | POA: Diagnosis not present

## 2016-02-12 DIAGNOSIS — R1310 Dysphagia, unspecified: Secondary | ICD-10-CM | POA: Diagnosis not present

## 2016-02-12 DIAGNOSIS — M4802 Spinal stenosis, cervical region: Secondary | ICD-10-CM | POA: Diagnosis not present

## 2016-02-12 DIAGNOSIS — Z8673 Personal history of transient ischemic attack (TIA), and cerebral infarction without residual deficits: Secondary | ICD-10-CM | POA: Diagnosis not present

## 2016-02-12 DIAGNOSIS — E1165 Type 2 diabetes mellitus with hyperglycemia: Secondary | ICD-10-CM

## 2016-02-12 DIAGNOSIS — IMO0002 Reserved for concepts with insufficient information to code with codable children: Secondary | ICD-10-CM

## 2016-02-12 MED ORDER — OXYCODONE HCL 5 MG PO TABS: 5.0000 mg | ORAL_TABLET | Freq: Four times a day (QID) | ORAL | Status: DC | PRN

## 2016-02-12 MED ORDER — GABAPENTIN 300 MG PO CAPS: 300.0000 mg | ORAL_CAPSULE | Freq: Three times a day (TID) | ORAL | Status: DC

## 2016-02-12 NOTE — Progress Notes (Signed)
Subjective:    Patient ID: William [name], male    DOB: 11/19/1955, 60 y.o.   MRN: [id number]  HPI   Mr. Wiesman is here in follow up of his cervical myelopathy. He was discharged from rehab 5 weeks ago. He has completed iv abx. He is on oral bactrim currently. He hasn't had any home therapy but has had a nurse. He's doing some exercises on his own at home.   He is having pain in both shoulders radiating to elbows. Pain is persistent regardless of activities. He does have isolated shoulder pain as well.   Oxycodone helps somewhat. He's stil on only  gabapentin. He's moving his bowels and blader well. His balance has improved. He is generally able to sleep at night.   Sugars have been high, typically in the 200's.    Pain Inventory Average Pain 7 Pain Right Now 7 My pain is sharp and aching  In the last 24 hours, has pain interfered with the following? General activity 8 Relation with others 8 Enjoyment of life 9 What TIME of day is your pain at its worst? Morning, Evening and Night Sleep (in general) Poor  Pain is worse with: walking and bending Pain improves with: medication Relief from Meds: NA  Mobility walk without assistance how many minutes can you walk? 5-10 ability to climb steps?  yes do you drive?  no transfers alone Do you have any goals in this area?  yes  Function disabled: date disabled NA I need assistance with the following:  dressing, bathing, meal prep and household duties Do you have any goals in this area?  yes  Neuro/Psych numbness  Prior Studies Any changes since last visit?  yes  Physicians involved in your care Any changes since last visit?  no   Family History  Problem Relation Age of Onset  . Hypertension Mother   . Diabetes Mother   . Heart disease Mother   . Thyroid disease Mother   . Hypertension Father   . Diabetes Father   . Heart disease Father    Social History   Social History  . Marital Status: Divorced   Spouse Name: N/A  . Number of Children: N/A  . Years of Education: N/A   Social History Main Topics  . Smoking status: Current Every Day Smoker -- 0.10 packs/day for 30 years    Types: Cigarettes    Last Attempt to Quit: 09/27/2014  . Smokeless tobacco: None  . Alcohol Use: 0.0 oz/week    0 Standard drinks or equivalent per week  . Drug Use: Yes     Comment: history of drug use, admitted 04/14/13 for cocaine use   . Sexual Activity:    Partners: Female    Pharmacist, hospital Protection: Condom   Other Topics Concern  . None   Social History Narrative   Past Surgical History  Procedure Laterality Date  . Rotator  cuff surgery       left shoulder   . Spider bite      on neck  . Anterior cervical decomp/discectomy fusion N/A 12/05/2015    Procedure: Anterior Cervical Decompression Fusion Cervical three-four, Cervical four-five, Cervical five-six, Cervical six-seven;  Surgeon: Julio Sicks, MD;  Location: [facility];  Service: Neurosurgery;  Laterality: N/A;  . Anterior cervical decompression for epidural abscess N/A 12/20/2015    Procedure: ANTERIOR CERVICAL DECOMPRESSION FOR EPIDURAL ABSCESS;  Surgeon: Hilda Lias, MD;  Location: [facility];  Service: Neurosurgery;  Laterality: N/A;  ANTERIOR CERVICAL DECOMPRESSION FOR EPIDURAL ABSCESS   Past Medical History  Diagnosis Date  . Hypertension   . Diabetes mellitus without complication (HCC)   . Chronic hepatitis C without hepatic coma (HCC)   . History of cocaine use   . Depression   . Arthritis   . History of TIA (transient ischemic attack)   . Hyperlipidemia   . History of migraine headaches   . Obesity, Class II, BMI 35-39.9   . BPH with obstruction/lower urinary tract symptoms   . Balanitis   . Urinary retention   . Tobacco abuse   . Abscess in epidural space of cervical spine 01/28/2016  . Retropharyngeal abscess 01/28/2016  . Hardware complicating wound infection (HCC) 01/28/2016   BP 137/94 mmHg  Pulse 114  Resp 16   SpO2 96%  Opioid Risk Score:   Fall Risk Score:  `1  Depression screen PHQ 2/9  [facility] 02/12/2016 08/03/2015 05/30/2015  Decreased Interest 0 0 0  Down, Depressed, Hopeless 0 0 0  PHQ - 2 Score 0 0 0       Review of Systems  Endocrine:       High Blood Sugar  Genitourinary: Positive for difficulty urinating.  Neurological: Positive for numbness.  All other systems reviewed and are negative.      Objective:   Physical Exam  HEENT NGT. Staples in neck, incision cdi General: No acute distress Mood and affect are appropriate Heart: Regular rate and rhythm no rubs murmurs or rubs Lungs: Clear to auscultation, normal effort. no rales or wheezes Abdomen: Positive bowel sounds, soft nontender to palpation, nondistended Extremities: No clubbing, cyanosis, or edema Skin: No evidence of breakdown, no evidence of rash Neurologic: Cranial nerves II through XII intact, motor strength is tr to 1-/5 in bilateral deltoid, 1+ to 2- bilateral biceps, 4= tricep, 5 grip,5 hip flexor, knee extensors, ankle dorsiflexor and plantar flexor--stable Sensory exam normal sensation to light touch in bilateral upper and lower extremities Cerebellar exam normal finger to nose to finger as well as heel to shin in bilateral upper and lower extremities Musculoskeletal: Full range of motion in all 4 extremities. No joint swelling, Mild dorsum of the hand swelling/edema, nontender no erythema Deep tendon reflexes are hyporeflexic bilateral upper and lower limbs Cognitively he's appropriate Psych: pleasant and appropriate      Assessment & Plan:  Medical Problem List and Plan: 1. Cervical myelopathy and bilateral C5 radiculopathy secondary to Cervical spinal stenosis and postoperative abscess -[facility]  -MAY NEED SHOULDER HARNESS (bilateral) 2. Dysphagia: discussed appropriate bite sizes, taking time, washing solids with fluids if needed.  3. Pain  Management:increase gabapentin to 300mg  TID.  -oxycodone for more severe pain  4. HTN: Monitor BP bid.  9. DMT2: Monitor BS ac/hs. Use SSI  - discussed adjusting his lantus in pm, adding back am dosing as well.  10. Neck abscess: -continue bactrim indefinitely per ID

## 2016-02-12 NOTE — Patient Instructions (Signed)
INCREASE LANTUS TO 30UNITS AT NIGHT. CONSIDER INCREASING TO 35UNITS IF NEEDED. YOU MAY NEED TO ADD MORNING LANTUS INSULIN   PLEASE CALL ME WITH ANY PROBLEMS OR QUESTIONS [phone]).

## 2016-02-12 NOTE — Addendum Note (Signed)
Addended by: Letta KocherHADLEY, Halden Phegley A on: 02/12/2016 03:09 PM   Modules accepted: Orders

## 2016-02-13 ENCOUNTER — Emergency Department
Admission: EM | Admit: 2016-02-13 | Discharge: 2016-02-13 | Disposition: A | Discharge status: Home / Self Care | Payer: Medicaid Other | Attending: Emergency Medicine | Admitting: Emergency Medicine

## 2016-02-13 ENCOUNTER — Telehealth: Payer: Self-pay | Admitting: *Deleted

## 2016-02-13 ENCOUNTER — Encounter: Payer: Self-pay | Admitting: *Deleted

## 2016-02-13 ENCOUNTER — Emergency Department: Payer: Medicaid Other

## 2016-02-13 DIAGNOSIS — E669 Obesity, unspecified: Secondary | ICD-10-CM | POA: Diagnosis not present

## 2016-02-13 DIAGNOSIS — Z79899 Other long term (current) drug therapy: Secondary | ICD-10-CM | POA: Diagnosis not present

## 2016-02-13 DIAGNOSIS — F17210 Nicotine dependence, cigarettes, uncomplicated: Secondary | ICD-10-CM | POA: Insufficient documentation

## 2016-02-13 DIAGNOSIS — M1990 Unspecified osteoarthritis, unspecified site: Secondary | ICD-10-CM | POA: Diagnosis not present

## 2016-02-13 DIAGNOSIS — E785 Hyperlipidemia, unspecified: Secondary | ICD-10-CM | POA: Diagnosis not present

## 2016-02-13 DIAGNOSIS — F1410 Cocaine abuse, uncomplicated: Secondary | ICD-10-CM | POA: Insufficient documentation

## 2016-02-13 DIAGNOSIS — F329 Major depressive disorder, single episode, unspecified: Secondary | ICD-10-CM | POA: Diagnosis not present

## 2016-02-13 DIAGNOSIS — Z8673 Personal history of transient ischemic attack (TIA), and cerebral infarction without residual deficits: Secondary | ICD-10-CM | POA: Diagnosis not present

## 2016-02-13 DIAGNOSIS — E1151 Type 2 diabetes mellitus with diabetic peripheral angiopathy without gangrene: Secondary | ICD-10-CM | POA: Diagnosis not present

## 2016-02-13 DIAGNOSIS — M79631 Pain in right forearm: Secondary | ICD-10-CM | POA: Diagnosis not present

## 2016-02-13 DIAGNOSIS — Z794 Long term (current) use of insulin: Secondary | ICD-10-CM | POA: Insufficient documentation

## 2016-02-13 DIAGNOSIS — I10 Essential (primary) hypertension: Secondary | ICD-10-CM | POA: Insufficient documentation

## 2016-02-13 MED ORDER — MELOXICAM 7.5 MG PO TABS: 7.5000 mg | ORAL_TABLET | Freq: Every day | ORAL | Status: DC

## 2016-02-13 NOTE — Discharge Instructions (Signed)
°  Musculoskeletal Pain Musculoskeletal pain is muscle and boney aches and pains. These pains can occur in any part of the body. Your caregiver may treat you without knowing the cause of the pain. They may treat you if blood or urine tests, X-rays, and other tests were normal.  CAUSES There is often not a definite cause or reason for these pains. These pains may be caused by a type of germ (virus). The discomfort may also come from overuse. Overuse includes working out too hard when your body is not fit. Boney aches also come from weather changes. Bone is sensitive to atmospheric pressure changes. HOME CARE INSTRUCTIONS   Ask when your test results will be ready. Make sure you get your test results.  Only take over-the-counter or prescription medicines for pain, discomfort, or fever as directed by your caregiver. If you were given medications for your condition, do not drive, operate machinery or power tools, or sign legal documents for 24 hours. Do not drink alcohol. Do not take sleeping pills or other medications that may interfere with treatment.  Continue all activities unless the activities cause more pain. When the pain lessens, slowly resume normal activities. Gradually increase the intensity and duration of the activities or exercise.  During periods of severe pain, bed rest may be helpful. Lay or sit in any position that is comfortable.  Putting ice on the injured area.  Put ice in a bag.  Place a towel between your skin and the bag.  Leave the ice on for 15 to 20 minutes, 3 to 4 times a day.  Follow up with your caregiver for continued problems and no reason can be found for the pain. If the pain becomes worse or does not go away, it may be necessary to repeat tests or do additional testing. Your caregiver may need to look further for a possible cause. SEEK IMMEDIATE MEDICAL CARE IF:  You have pain that is getting worse and is not relieved by medications.  You develop chest pain  that is associated with shortness or breath, sweating, feeling sick to your stomach (nauseous), or throw up (vomit).  Your pain becomes localized to the abdomen.  You develop any new symptoms that seem different or that concern you. MAKE SURE YOU:   Understand these instructions.  Will watch your condition.  Will get help right away if you are not doing well or get worse.   This information is not intended to replace advice given to you by your health care provider. Make sure you discuss any questions you have with your health care provider.   Document Released: 09/22/2005 Document Revised: 12/15/2011 Document Reviewed: 05/27/2013 Elsevier Interactive Patient Education 2016 [employer]    [facility] continued problems. You may also follow-up with the orthopedist on call who is Dr. Joice LoftsPoggi if any continued concerns. Meloxicam 7.5 one daily with food for arthritis pain. Discontinue taking this medication if there is any stomach discomfort.

## 2016-02-13 NOTE — ED Provider Notes (Signed)
[facility] Emergency Department Provider Note   ____________________________________________  Time seen: Approximately 5:44 PM  I have reviewed the triage vital signs and the nursing notes.   HISTORY  Chief Complaint Arm Injury   HPI Jill SideWarren Molnar is a 60 y.o. male this is complaining of right forearm pain. He states [facility] and [facility]-ray done. He states that he was called today and was told that he had a fracture to his right forearm. There is no history of injury and patient is unaware of any health problems that would cause him to have spontaneous fractures. He denies any history of osteoporosis. Patient states that while his wife is here being seen that he checked in to have his arm "looked at".Currently he rates his pain as an 8 out of 10.   Past Medical History  Diagnosis Date  . Hypertension   . Diabetes mellitus without complication (HCC)   . Chronic hepatitis C without hepatic coma (HCC)   . History of cocaine use   . Depression   . Arthritis   . History of TIA (transient ischemic attack)   . Hyperlipidemia   . History of migraine headaches   . Obesity, Class II, BMI 35-39.9   . BPH with obstruction/lower urinary tract symptoms   . Balanitis   . Urinary retention   . Tobacco abuse   . Abscess in epidural space of cervical spine 01/28/2016  . Retropharyngeal abscess 01/28/2016  . Hardware complicating wound infection (HCC) 01/28/2016    Patient Active Problem List   Diagnosis Date Noted  . Abscess in epidural space of cervical spine 01/28/2016  . Retropharyngeal abscess 01/28/2016  . Hardware complicating wound infection (HCC) 01/28/2016  . Dysphagia 01/01/2016  . Cervical spondylosis with myelopathy 12/27/2015  . Uncontrolled type 2 diabetes mellitus with complication, with long-term current use of insulin (HCC)   . Chronic hepatitis C without hepatic coma (HCC)   . H/O cervical spine surgery   . Tachycardia    . Tachypnea   . Hypokalemia   . Encounter for nasogastric (NG) tube placement   . Neck abscess   . Brodie's abscess of cervical spine (HCC) 12/20/2015  . Abscess, neck   . Endotracheal tube present   . Post-operative wound abscess   . Respiratory failure with hypoxia (HCC)   . Cervical spinal stenosis 12/05/2015  . AA (alcohol abuse) 10/11/2015  . Esophageal reflux   . Alcohol abuse   . DKA (diabetic ketoacidoses) (HCC) 08/09/2015  . Chest pain 08/09/2015  . Hyponatremia 08/09/2015  . Essential hypertension 08/09/2015  . Migraine with aura 08/09/2015  . Insomnia 08/09/2015  . Chronic pain 08/09/2015  . Diabetes mellitus (HCC) 08/09/2015  . Diabetic ketoacidosis without coma associated with type 2 diabetes mellitus (HCC)   . Weakness of left lower extremity   . Cervical radiculopathy at C5 08/03/2015  . Left upper extremity numbness 08/03/2015  . Right upper extremity numbness 08/03/2015  . Uncontrolled type 2 diabetes mellitus with background retinopathy (HCC) 07/19/2015  . Degenerative arthritis of thumb 07/10/2015  . Chronic radicular cervical pain 06/08/2015  . Degeneration of lumbar/lumbosacral disc without myelopathy 06/08/2015  . Degeneration of intervertebral disc of lumbosacral region 06/08/2015  . Metabolic syndrome 05/31/2015  . Knee pain 07/12/2014  . Balanitis 07/12/2014  . History of cocaine use 07/12/2014  . Depression 07/12/2014  . Chronic pain of multiple joints 07/12/2014  . Hx-TIA (transient ischemic attack) 07/12/2014  . Hx  of migraine headaches 07/12/2014  . Obesity, Class I, BMI 30.0-34.9 (see actual BMI) 07/12/2014  . Cocaine abuse 07/12/2014  . Major depressive disorder with single episode (HCC) 07/12/2014  . Adiposity 07/12/2014  . Ache in joint 07/12/2014  . H/O disease 07/12/2014  . Personal history of transient ischemic attack (TIA), and cerebral infarction without residual deficits 07/12/2014  . Chronic hepatitis C (HCC) 07/11/2014  .  Hyperlipidemia LDL goal <100 07/11/2014  . Chronic hepatitis C virus infection (HCC) 07/11/2014  . HLD (hyperlipidemia) 07/11/2014  . Uncontrolled type 2 diabetes mellitus with peripheral circulatory disorder (HCC) 12/02/2013  . Hypertension, goal below 140/90 12/02/2013  . Essential (primary) hypertension 12/02/2013  . Type 2 diabetes mellitus with peripheral angiopathy (HCC) 12/02/2013    Past Surgical History  Procedure Laterality Date  . Rotator  cuff surgery       left shoulder   . Spider bite      on neck  . Anterior cervical decomp/discectomy fusion N/A 12/05/2015    Procedure: Anterior Cervical Decompression Fusion Cervical three-four, Cervical four-five, Cervical five-six, Cervical six-seven;  Surgeon: Julio Sicks, MD;  Location: [facility];  Service: Neurosurgery;  Laterality: N/A;  . Anterior cervical decompression for epidural abscess N/A 12/20/2015    Procedure: ANTERIOR CERVICAL DECOMPRESSION FOR EPIDURAL ABSCESS;  Surgeon: Hilda Lias, MD;  Location: [facility];  Service: Neurosurgery;  Laterality: N/A;  ANTERIOR CERVICAL DECOMPRESSION FOR EPIDURAL ABSCESS    Current Outpatient Rx  Name  Route  Sig  Dispense  Refill  . amLODipine (NORVASC) 10 MG tablet   Oral   Take 1 tablet (10 mg total) by mouth daily.   30 tablet   3   . BD PEN NEEDLE NANO U/F 32G X 4 MM MISC      USE WITH FLEXPEN TO ADMINISTER INSULIN   100 each   5   . Elastic Bandages & Supports (THUMB BRACE) MISC   Does not apply   1 Device by Does not apply route daily as needed.   1 each   0     Right hand Thumb Brace with Copper size per fit.   . Elbasvir-Grazoprevir (ZEPATIER) 50-100 MG TABS   Oral   Take 1 tablet by mouth daily.   28 tablet   3   . gabapentin (NEURONTIN) 300 MG capsule   Oral   Take 1 capsule (300 mg total) by mouth 3 (three) times daily.   90 capsule   3   . glucose blood (ACCU-CHEK AVIVA) test strip      Use as instructed to check blood glucose 4x/day   200  each   12   . insulin aspart (NOVOLOG FLEXPEN) 100 UNIT/ML FlexPen   Subcutaneous   Inject 15 Units into the skin 3 (three) times daily with meals. Patient taking differently: Inject 25 Units into the skin 3 (three) times daily with meals.    8 pen   5   . insulin glargine (LANTUS) 100 UNIT/ML injection   Subcutaneous   Inject 0.35 mLs (35 Units total) into the skin at bedtime.   10 mL   11   . insulin glargine (LANTUS) 100 UNIT/ML injection   Subcutaneous   Inject 0.1 mLs (10 Units total) into the skin daily.   10 mL   11   . Insulin Pen Needle (EASY TOUCH PEN NEEDLES) 32G X 6 MM MISC      Use with Flexpen to administer Insulin   100 each  11   . Lancets (FREESTYLE) lancets      Use as instructed to check blood glucose 4x/day   200 each   12   . meloxicam (MOBIC) 7.5 MG tablet   Oral   Take 1 tablet (7.5 mg total) by mouth daily.   7 tablet   2   . oxyCODONE (OXY IR/ROXICODONE) 5 MG immediate release tablet   Oral   Take 1 tablet (5 mg total) by mouth every 6 (six) hours as needed for severe pain.   75 tablet   0   . pantoprazole (PROTONIX) 40 MG tablet   Oral   Take 1 tablet (40 mg total) by mouth 2 (two) times daily. Twice a day for 10 days; then start using it once a day   60 tablet   1   . sulfamethoxazole-trimethoprim (BACTRIM DS,SEPTRA DS) 800-160 MG tablet   Oral   Take 1 tablet by mouth 2 (two) times daily.   60 tablet   11     Allergies Metformin and related; Prednisone; Tamsulosin; and Other  Family History  Problem Relation Age of Onset  . Hypertension Mother   . Diabetes Mother   . Heart disease Mother   . Thyroid disease Mother   . Hypertension Father   . Diabetes Father   . Heart disease Father     Social History Social History  Substance Use Topics  . Smoking status: Current Every Day Smoker -- 0.10 packs/day for 30 years    Types: Cigarettes    Last Attempt to Quit: 09/27/2014  . Smokeless tobacco: None  . Alcohol Use:  0.0 oz/week    0 Standard drinks or equivalent per week    Review of Systems Constitutional: No fever/chills Cardiovascular: Denies chest pain. Respiratory: Denies shortness of breath. Gastrointestinal:  No nausea, no vomiting.   Musculoskeletal: Right forearm pain. Skin: Negative for rash. Neurological: Negative for headaches, focal weakness or numbness.  10-point ROS otherwise negative.  ____________________________________________   PHYSICAL EXAM:  VITAL SIGNS: ED Triage Vitals  Enc Vitals Group     BP 02/13/16 1636 124/91 mmHg     Pulse Rate 02/13/16 1636 114     Resp 02/13/16 1636 20     Temp 02/13/16 1636 98.6 F (37 C)     Temp Source 02/13/16 1636 Oral     SpO2 02/13/16 1636 98 %     Weight 02/13/16 1636 216 lb (97.977 kg)     Height 02/13/16 1636  (1.778 m)     Head Cir --      Peak Flow --      Pain Score 02/13/16 1637 8     Pain Loc --      Pain Edu? --      Excl. in GC? --     Constitutional: Alert and oriented. Well appearing and in no acute distress. Eyes: Conjunctivae are normal. PERRL. EOMI. Head: Atraumatic. Nose: No congestion/rhinnorhea. Neck: No stridor.   Cardiovascular: Normal rate, regular rhythm. Grossly normal heart sounds.  Good peripheral circulation. Respiratory: Normal respiratory effort.  No retractions. Lungs CTAB. Musculoskeletal:Right forearm and elbow. There is no gross deformity or soft tissue edema. Range of motion is restricted however patient has bilateral disability of both his arms and chronically has decreased range of motion in his upper extremities. Neurologic:  Normal speech and language. No gross focal neurologic deficits are appreciated. No gait instability. Skin:  Skin is warm, dry and intact. No rash noted. No ecchymosis,  abrasions, erythema. Psychiatric: Mood and affect are normal. Speech and behavior are normal.  ____________________________________________   LABS (all labs ordered are listed, but only  abnormal results are displayed)  Labs Reviewed - No data to display  RADIOLOGY  Right forearm x-ray per radiologist shows degenerative changes but no fracture. I, Tommi Rumps, personally viewed and evaluated these images (plain radiographs) as part of my medical decision making, as well as reviewing the written report by the radiologist. ____________________________________________   PROCEDURES  Procedure(s) performed: None  Critical Care performed: No  ____________________________________________   INITIAL IMPRESSION / ASSESSMENT AND PLAN / ED COURSE  Pertinent labs & imaging results that were available during my care of the patient were reviewed by me and considered in my medical decision making (see chart for details).  Patient was informed that he does not have a fracture but has degenerative changes. Patient will follow up with his primary care doctor once he has established another one. He will also be given the name of the orthopedist on call should he need to go see them. He denies any history of taking blood thinners, aspirin products or having peptic ulcer disease. He is requesting to be placed under an arthritis medicine. Patient was given a prescription for meloxicam 7.5 one daily for 7 days. He is aware that he needs to take this medication with food. ____________________________________________   FINAL CLINICAL IMPRESSION(S) / ED DIAGNOSES  Final diagnoses:  Right forearm pain      NEW MEDICATIONS STARTED DURING THIS VISIT:  New Prescriptions   MELOXICAM (MOBIC) 7.5 MG TABLET    Take 1 tablet (7.5 mg total) by mouth daily.     Note:  This document was prepared using Dragon voice recognition software and may include unintentional dictation errors.    Tommi Rumps, PA-C 02/13/16 Rickey Primus  Sharman Cheek, MD 02/15/16 [id number]

## 2016-02-13 NOTE — Telephone Encounter (Signed)
Patient called to get the results of his recent liver scan 02/06/16. Advised will have the doctor look at and give him a call with results.

## 2016-02-13 NOTE — ED Notes (Signed)
Pt has pain in right forearm.  States went to urgent care yesterday and was called today and was told he had fx to right forearm.  Unknown injury to arm.  Pt alert.

## 2016-02-15 ENCOUNTER — Ambulatory Visit: Payer: Medicaid Other | Admitting: Occupational Therapy

## 2016-02-19 ENCOUNTER — Emergency Department (HOSPITAL_COMMUNITY): Payer: Medicaid Other

## 2016-02-19 ENCOUNTER — Encounter (HOSPITAL_COMMUNITY): Payer: Self-pay | Admitting: *Deleted

## 2016-02-19 ENCOUNTER — Emergency Department (HOSPITAL_COMMUNITY)
Admission: EM | Admit: 2016-02-19 | Discharge: 2016-02-19 | Disposition: A | Discharge status: Home / Self Care | Payer: Medicaid Other | Attending: Emergency Medicine | Admitting: Emergency Medicine

## 2016-02-19 DIAGNOSIS — Z79899 Other long term (current) drug therapy: Secondary | ICD-10-CM | POA: Insufficient documentation

## 2016-02-19 DIAGNOSIS — E669 Obesity, unspecified: Secondary | ICD-10-CM | POA: Diagnosis not present

## 2016-02-19 DIAGNOSIS — Z8673 Personal history of transient ischemic attack (TIA), and cerebral infarction without residual deficits: Secondary | ICD-10-CM | POA: Insufficient documentation

## 2016-02-19 DIAGNOSIS — F329 Major depressive disorder, single episode, unspecified: Secondary | ICD-10-CM | POA: Diagnosis not present

## 2016-02-19 DIAGNOSIS — M1990 Unspecified osteoarthritis, unspecified site: Secondary | ICD-10-CM | POA: Insufficient documentation

## 2016-02-19 DIAGNOSIS — Z8709 Personal history of other diseases of the respiratory system: Secondary | ICD-10-CM | POA: Diagnosis not present

## 2016-02-19 DIAGNOSIS — F17210 Nicotine dependence, cigarettes, uncomplicated: Secondary | ICD-10-CM | POA: Diagnosis not present

## 2016-02-19 DIAGNOSIS — M79631 Pain in right forearm: Secondary | ICD-10-CM | POA: Insufficient documentation

## 2016-02-19 DIAGNOSIS — E119 Type 2 diabetes mellitus without complications: Secondary | ICD-10-CM | POA: Insufficient documentation

## 2016-02-19 DIAGNOSIS — Z792 Long term (current) use of antibiotics: Secondary | ICD-10-CM | POA: Diagnosis not present

## 2016-02-19 DIAGNOSIS — I10 Essential (primary) hypertension: Secondary | ICD-10-CM | POA: Diagnosis not present

## 2016-02-19 DIAGNOSIS — Z8619 Personal history of other infectious and parasitic diseases: Secondary | ICD-10-CM | POA: Diagnosis not present

## 2016-02-19 DIAGNOSIS — Z8669 Personal history of other diseases of the nervous system and sense organs: Secondary | ICD-10-CM | POA: Diagnosis not present

## 2016-02-19 DIAGNOSIS — Z794 Long term (current) use of insulin: Secondary | ICD-10-CM | POA: Diagnosis not present

## 2016-02-19 DIAGNOSIS — M79601 Pain in right arm: Secondary | ICD-10-CM

## 2016-02-19 DIAGNOSIS — Z87438 Personal history of other diseases of male genital organs: Secondary | ICD-10-CM | POA: Diagnosis not present

## 2016-02-19 DIAGNOSIS — M25511 Pain in right shoulder: Secondary | ICD-10-CM | POA: Insufficient documentation

## 2016-02-19 NOTE — ED Notes (Signed)
..  Pt A&OX4, ambulatory at d/c with steady, NAD.  See PAs notes for secondary assessment

## 2016-02-19 NOTE — Telephone Encounter (Signed)
Good news!  He started Zepatier 02/08/16 and [facility]  Minh will schedule labs and a follow up with you at that time.

## 2016-02-19 NOTE — Discharge Instructions (Signed)
Continue taking a prescription of meloxicam as prescribed. He may also apply ice to affected area for 15-20 minutes 3-4 times daily to help with pain and swelling. I recommend refrain from doing any movements or heavy lifting that exacerbate your symptoms for the next week until your pain has improved. Follow-up with your primary care provider at your scheduled appointment on June 8. If your pain has not improved or has worsened over the next 2 weeks I recommend following up with the orthopedist listed above. Return to the emergency department with new onset of fever, redness, swelling, warmth, numbness, tingling, weakness.

## 2016-02-19 NOTE — ED Provider Notes (Signed)
CSN: [id number]     Arrival date & time 02/19/16  1507 History  By signing my name below, I, Linna DarnerRussell Turner, attest that this documentation has been prepared under the direction and in the presence of non-physician practitioner, Melburn HakeNicole Averleigh Savary, PA-C. Electronically Signed: Linna Darnerussell Turner, Scribe. 02/19/2016. 4:21 PM.   Chief Complaint  Patient presents with  . Arm Pain    The history is provided by the patient. No language interpreter was used.     HPI Comments: Jill SideWarren Harbuck is a 60 y.o. male with h/o DM and HTN, DM, arthritis, TIA who presents to the Emergency Department complaining of constant, worsening, severe, sharp, right arm pain and swelling onset 3 months. Pt endorses pain from his right elbow that radiates to his right wrist and bicep. Pt endorses severe right forearm pain exacerbation with movement. He also notes significant soreness in his right bicep since onset. Pt reports that he was seen at Urgent Care 8 days ago; he had x-rays of his right elbow and right forearm taken and was told that he had a fracture in his right forearm appx. 2 days later while he was in the [facility] ED with his wife. While in the [facility] ED he had repeat x-rays taken which came back negative; he was prescribed Meloxicam but has not been taking it. Pt had two cervical neck surgeries in March of this year; he has partial paralysis of his bilateral arms and significant weakness in his bilateral arms at baseline as a result of these surgeries, denies any recent changes in his upper arm weakness. He states that he cannot bend his right elbow at baseline since his recent surgeries. He denies recent injury to his right arm. Pt has used oxycodone and gabapentin for his pain with no relief. He denies fever, numbness/tingling in his right arm, or any other associated symptoms. Denies any recent fall, trauma or injury to his right upper extremity.  Past Medical History  Diagnosis Date  . Hypertension   . Diabetes mellitus  without complication (HCC)   . Chronic hepatitis C without hepatic coma (HCC)   . History of cocaine use   . Depression   . Arthritis   . History of TIA (transient ischemic attack)   . Hyperlipidemia   . History of migraine headaches   . Obesity, Class II, BMI 35-39.9   . BPH with obstruction/lower urinary tract symptoms   . Balanitis   . Urinary retention   . Tobacco abuse   . Abscess in epidural space of cervical spine 01/28/2016  . Retropharyngeal abscess 01/28/2016  . Hardware complicating wound infection (HCC) 01/28/2016   Past Surgical History  Procedure Laterality Date  . Rotator  cuff surgery       left shoulder   . Spider bite      on neck  . Anterior cervical decomp/discectomy fusion N/A 12/05/2015    Procedure: Anterior Cervical Decompression Fusion Cervical three-four, Cervical four-five, Cervical five-six, Cervical six-seven;  Surgeon: Julio SicksHenry Pool, MD;  Location: [facility];  Service: Neurosurgery;  Laterality: N/A;  . Anterior cervical decompression for epidural abscess N/A 12/20/2015    Procedure: ANTERIOR CERVICAL DECOMPRESSION FOR EPIDURAL ABSCESS;  Surgeon: Hilda LiasErnesto Botero, MD;  Location: [facility];  Service: Neurosurgery;  Laterality: N/A;  ANTERIOR CERVICAL DECOMPRESSION FOR EPIDURAL ABSCESS   Family History  Problem Relation Age of Onset  . Hypertension Mother   . Diabetes Mother   . Heart disease Mother   . Thyroid disease Mother   .  Hypertension Father   . Diabetes Father   . Heart disease Father    Social History  Substance Use Topics  . Smoking status: Current Every Day Smoker -- 0.10 packs/day for 30 years    Types: Cigarettes    Last Attempt to Quit: 09/27/2014  . Smokeless tobacco: None  . Alcohol Use: 0.0 oz/week    0 Standard drinks or equivalent per week    Review of Systems  Constitutional: Negative for fever.  Musculoskeletal: Positive for joint swelling (right forearm) and arthralgias (right forearm).  Skin: Negative for wound.   Neurological: Positive for weakness. Negative for numbness.    Allergies  Metformin and related; Prednisone; Tamsulosin; and Other  Home Medications   Prior to Admission medications   Medication Sig Start Date End Date Taking? Authorizing Provider  amLODipine (NORVASC) 10 MG tablet Take 1 tablet (10 mg total) by mouth daily. 01/04/16   Mcarthur Rossetti Angiulli, PA-C  BD PEN NEEDLE NANO U/F 32G X 4 MM MISC USE WITH FLEXPEN TO ADMINISTER INSULIN 08/14/15   Edwena Felty, MD  Elastic Bandages & Supports (THUMB BRACE) MISC 1 Device by Does not apply route daily as needed. 07/10/15   Edwena Felty, MD  Elbasvir-Grazoprevir (ZEPATIER) 50-100 MG TABS Take 1 tablet by mouth daily. 02/05/16   Randall Hiss, MD  gabapentin (NEURONTIN) 300 MG capsule Take 1 capsule (300 mg total) by mouth 3 (three) times daily. 02/12/16   Ranelle Oyster, MD  glucose blood (ACCU-CHEK AVIVA) test strip Use as instructed to check blood glucose 4x/day 05/30/15   Edwena Felty, MD  insulin aspart (NOVOLOG FLEXPEN) 100 UNIT/ML FlexPen Inject 15 Units into the skin 3 (three) times daily with meals. Patient taking differently: Inject 25 Units into the skin 3 (three) times daily with meals.  08/11/15   Vassie Loll, MD  insulin glargine (LANTUS) 100 UNIT/ML injection Inject 0.35 mLs (35 Units total) into the skin at bedtime. 01/04/16   Mcarthur Rossetti Angiulli, PA-C  insulin glargine (LANTUS) 100 UNIT/ML injection Inject 0.1 mLs (10 Units total) into the skin daily. 01/04/16   Mcarthur Rossetti Angiulli, PA-C  Insulin Pen Needle (EASY TOUCH PEN NEEDLES) 32G X 6 MM MISC Use with Flexpen to administer Insulin 08/04/14   Radhika P Phadke, MD  Lancets (FREESTYLE) lancets Use as instructed to check blood glucose 4x/day 05/30/15   Edwena Felty, MD  meloxicam (MOBIC) 7.5 MG tablet Take 1 tablet (7.5 mg total) by mouth daily. 02/13/16 02/12/17  Tommi Rumps, PA-C  oxyCODONE (OXY IR/ROXICODONE) 5 MG immediate release tablet Take 1 tablet (5 mg total)  by mouth every 6 (six) hours as needed for severe pain. 02/12/16   Ranelle Oyster, MD  pantoprazole (PROTONIX) 40 MG tablet Take 1 tablet (40 mg total) by mouth 2 (two) times daily. Twice a day for 10 days; then start using it once a day 01/04/16   Mcarthur Rossetti Angiulli, PA-C  sulfamethoxazole-trimethoprim (BACTRIM DS,SEPTRA DS) 800-160 MG tablet Take 1 tablet by mouth 2 (two) times daily. 01/28/16   Randall Hiss, MD   BP 138/100 mmHg  Pulse 100  Temp(Src) 97.7 F (36.5 C) (Oral)  Resp 18  Ht  (1.778 m)  Wt 104.015 kg  BMI 32.90 kg/m2  SpO2 98% Physical Exam  Constitutional: He is oriented to person, place, and time. He appears well-developed and well-nourished. No distress.  HENT:  Head: Normocephalic and atraumatic.  Eyes: Conjunctivae and EOM are normal.  Neck: Normal range  of motion. Neck supple. No tracheal deviation present.  Cardiovascular: Normal rate.   Heart rate 92.  Pulmonary/Chest: Effort normal. No respiratory distress.  Musculoskeletal: Normal range of motion. He exhibits tenderness. He exhibits no edema.  Tender to palpation at right mid-forearm, distal bicep, and posterior humerus. Full passive ROM of right shoulder, elbow, forearm, and wrist. Full active ROM with decreased strength which is consistent with strength of left arm (patient reports unchanged since surgical complication). Sensation grossly intact, 2+ radial pulses, cap refill < 2, equal grip strength bilaterally. No swelling, abrasion, contusion, laceration, or deformity.  Neurological: He is alert and oriented to person, place, and time.  Skin: Skin is warm and dry.  Psychiatric: He has a normal mood and affect. His behavior is normal.  Nursing note and vitals reviewed.   ED Course  Procedures (including critical care time)  DIAGNOSTIC STUDIES: Oxygen Saturation is 94% on RA, adequate by my interpretation.    COORDINATION OF CARE: 4:21 PM Will order DG Elbow Complete Right and DG Humerus  Right. Discussed treatment plan with pt at bedside and pt agreed to plan.  Imaging Review Dg Elbow Complete Right  02/19/2016  CLINICAL DATA:  60 year old male with right arm pain and weakness for the past several months EXAM: RIGHT ELBOW - COMPLETE 3+ VIEW COMPARISON:  Prior radiographs of the right forearm 02/13/2016 FINDINGS: No evidence of fracture or joint effusion. Degenerative osteoarthritis is present at the elbow joint with probable loose bodies in the anterior joint space. Normal bony mineralization. No lytic or blastic osseous lesion. IMPRESSION: 1. No evidence of fracture, malalignment or osseous lesion. 2. Elbow joint degenerative osteoarthritis with probable loose bodies in the anterior joint space. Electronically Signed   By: Malachy Moan M.D.   On: 02/19/2016 17:03   Dg Humerus Right  02/19/2016  CLINICAL DATA:  60 year old male with right arm pain and weakness for the past several months EXAM: RIGHT HUMERUS - 2+ VIEW COMPARISON:  Concurrently obtained radiographs of the elbow; prior radiographs of the right forearm 02/13/2016 FINDINGS: There is no evidence of fracture or other focal bone lesions. Degenerative changes at the elbow joint. Soft tissues are unremarkable. IMPRESSION: Negative. Electronically Signed   By: Malachy Moan M.D.   On: 02/19/2016 17:04   I have personally reviewed and evaluated these images as part of my medical decision-making.  MDM   Final diagnoses:  Right arm pain  Osteoarthritis, unspecified osteoarthritis type, unspecified site    Patient presents with right arm pain. Denies any recent fall, trauma, injury. Patient was initially seen at an urgent care on 5/8 and was notified personally 2 days later that he had a fracture. He was then seen at [facility] ED for further treatment regarding his fracture. Chart review shows patient's right forearm x-ray performed at all [facility] ED was negative for fracture and showed degenerative changes. Patient was  discharged home with meloxicam. Patient denies filling prescription of meloxicam. VSS. Exam revealed tenderness over right forearm and biceps. Bilateral upper extremities otherwise neurovascular intact. We'll plan to order x-rays of right elbow and humerus for further evaluation due to tenderness on exam. X-rays revealed degenerative osteoarthritis, otherwise negative. I discussed results with patient and advised him that his x-rays performed today did not show any evidence of fracture. I suspect patient's symptoms are likely due to degenerative osteoarthritic changes. I advised patient to continue taking his prescription of meloxicam as prescribed. Advised patient to follow up with orthopedics in 1-2 weeks if his pain  has not improved or has worsened, otherwise I advised patient to follow up with his PCP at his scheduled appointment on June 8. Discussed return precautions with patient.  I personally performed the services described in this documentation, which was scribed in my presence. The recorded information has been reviewed and is accurate.   Satira Sark [place], [place] 02/19/16 1754  Nelva Nay, MD 02/21/16 [phone]

## 2016-02-19 NOTE — ED Notes (Signed)
Pt c/o R arm pain onset x 3 mths ago, pt seen @ [facility] for similar symtpoms & had an xray, pt states, "They said it was fractured, but they didn't do anything for it." pt able to wiggle fingers, +PMS, pt denies injury to the area

## 2016-02-19 NOTE — Telephone Encounter (Signed)
His blood work and his liver scan show  That his liver has significant scarring. We DEFINITELY need to treat his Hepatitis C and he needs to abstain from alcohol and other things that can hurt his liver.  William [name], [name], Do you think Harvoni is the way to go for him x 3 months?

## 2016-02-20 LAB — TOXASSURE SELECT,+ANTIDEPR,UR

## 2016-02-20 NOTE — Progress Notes (Signed)
Urine drug screen for this encounter is consistent for prescribed medication 

## 2016-02-20 NOTE — Telephone Encounter (Signed)
Ok very good.

## 2016-02-25 DIAGNOSIS — M5412 Radiculopathy, cervical region: Secondary | ICD-10-CM | POA: Diagnosis not present

## 2016-02-25 DIAGNOSIS — T814XXA Infection following a procedure, initial encounter: Secondary | ICD-10-CM | POA: Diagnosis not present

## 2016-02-25 DIAGNOSIS — M4802 Spinal stenosis, cervical region: Secondary | ICD-10-CM | POA: Diagnosis not present

## 2016-02-25 DIAGNOSIS — M5000 Cervical disc disorder with myelopathy, unspecified cervical region: Secondary | ICD-10-CM | POA: Diagnosis not present

## 2016-02-26 ENCOUNTER — Ambulatory Visit: Payer: Medicaid Other | Attending: Physical Medicine & Rehabilitation | Admitting: Occupational Therapy

## 2016-02-26 DIAGNOSIS — M6281 Muscle weakness (generalized): Secondary | ICD-10-CM | POA: Diagnosis present

## 2016-02-26 DIAGNOSIS — R278 Other lack of coordination: Secondary | ICD-10-CM | POA: Insufficient documentation

## 2016-02-28 ENCOUNTER — Ambulatory Visit: Payer: Medicaid Other

## 2016-02-28 ENCOUNTER — Ambulatory Visit (INDEPENDENT_AMBULATORY_CARE_PROVIDER_SITE_OTHER): Payer: Medicaid Other | Admitting: Pharmacist Clinician (PhC)/ Clinical Pharmacy Specialist

## 2016-02-28 ENCOUNTER — Other Ambulatory Visit: Payer: Medicaid Other

## 2016-02-28 DIAGNOSIS — Z23 Encounter for immunization: Secondary | ICD-10-CM

## 2016-02-28 DIAGNOSIS — B182 Chronic viral hepatitis C: Secondary | ICD-10-CM

## 2016-02-28 LAB — COMPLETE METABOLIC PANEL WITH GFR
ALBUMIN: 3.9 g/dL (ref 3.6–5.1)
ALT: 18 U/L (ref 9–46)
AST: 17 U/L (ref 10–35)
Alkaline Phosphatase: 104 U/L (ref 40–115)
BUN: 8 mg/dL (ref 7–25)
CO2: 22 mmol/L (ref 20–31)
Calcium: 9 mg/dL (ref 8.6–10.3)
Chloride: 98 mmol/L (ref 98–110)
Creat: 0.97 mg/dL (ref 0.70–1.25)
GFR, Est African American: >89 mL/min (ref >=60)
GFR, Est Non African American: 84 mL/min (ref >=60)
GLUCOSE: 256 mg/dL — AB (ref 65–99)
POTASSIUM: 4.1 mmol/L (ref 3.5–5.3)
SODIUM: 133 mmol/L — AB (ref 135–146)
Total Bilirubin: 0.4 mg/dL (ref 0.2–1.2)
Total Protein: 6.8 g/dL (ref 6.1–8.1)

## 2016-02-28 NOTE — Therapy (Signed)
[facility] MAIN [facility] SERVICES [address] [place], [place], [address] Phone: [phone]   Fax:  [phone]  Occupational Therapy Evaluation  Patient Details  Name: William [name] MRN: [id number] Date of Birth: 10-Dec-1955 No Data Recorded  Encounter Date: 02/26/2016      OT End of Session - 02/27/16 1527    Visit Number 1   Number of Visits 16   Authorization Type medicaid    OT Start Time 1000   OT Stop Time 1100   OT Time Calculation (min) 60 min   Activity Tolerance Patient tolerated treatment well   Behavior During Therapy [facility] for tasks assessed/performed      Past Medical History  Diagnosis Date  . Hypertension   . Diabetes mellitus without complication (HCC)   . Chronic hepatitis C without hepatic coma (HCC)   . History of cocaine use   . Depression   . Arthritis   . History of TIA (transient ischemic attack)   . Hyperlipidemia   . History of migraine headaches   . Obesity, Class II, BMI 35-39.9   . BPH with obstruction/lower urinary tract symptoms   . Balanitis   . Urinary retention   . Tobacco abuse   . Abscess in epidural space of cervical spine 01/28/2016  . Retropharyngeal abscess 01/28/2016  . Hardware complicating wound infection (HCC) 01/28/2016    Past Surgical History  Procedure Laterality Date  . Rotator  cuff surgery       left shoulder   . Spider bite      on neck  . Anterior cervical decomp/discectomy fusion N/A 12/05/2015    Procedure: Anterior Cervical Decompression Fusion Cervical three-four, Cervical four-five, Cervical five-six, Cervical six-seven;  Surgeon: Julio Sicks, MD;  Location: [facility]UR[facility] ORS;  Service: Neurosurgery;  Laterality: N/A;  . Anterior cervical decompression for epidural abscess N/A 12/20/2015    Procedure: ANTERIOR CERVICAL DECOMPRESSION FOR EPIDURAL ABSCESS;  Surgeon: Hilda Lias, MD;  Location: [facility]UR[facility] OR[facility];  Service: Neurosurgery;  Laterality: N/A;  ANTERIOR CERVICAL DECOMPRESSION  FOR EPIDURAL ABSCESS    There were no vitals filed for this visit.      Subjective Assessment - 02/27/16 1612    Subjective  Patient reports he wants to be able to take care of himself again, frustrated he cannot perform much of his basic self care tasks.     Patient is accompained by: Family member   Pertinent History Patient is a 60 yo male with a diagnosis of cervical stenosis with myelopathy and underwent surgery for ACDF C3 -C7 on 12/05/15 and was discharged home on 12/07/2015, he subsequently had complications, neck edema and difficulty breathing and swallowing and was readmitted to the hospital on 12/20/2015 and had to have surgery for a large abcess, he went to IP rehab and was discharged on 01/04/2016 and now presents for outpatient OT.    Patient Stated Goals Wants to be independent, move his arms again, be able to care for himself.    Currently in Pain? No/denies   Pain Score 0-No pain   Multiple Pain Sites No           [facility] OT Assessment - 02/27/16 1508    Assessment   Diagnosis cervical stenosis with myelopathy s/p ACDF C3-C7, complicated by large abcess, bilateral C5 radiculopathy   Onset Date 12/05/15   Prior Therapy IP rehab   Balance Screen   Has the patient fallen in the past 6 months No   Has the  patient had a decrease in activity level because of a fear of falling?  No   Is the patient reluctant to leave their home because of a fear of falling?  No   Home  Environment   Family/patient expects to be discharged to: Private residence   Living Arrangements Spouse/significant other   Available Help at Discharge Family   Type of Home Aartment   Home Access Level entry   Home Layout One level   Bathroom Shower/Tub Tub/Shower unit;Curtain   Leisure centre managerBathroom Toilet Standard   Bathroom Accessibility Yes   Home Equipment Shower seat   Lives With Significant other   Prior Function   Level of Independence Independent   Vocation On disability   ADL   Eating/Feeding Modified  independent   Grooming Modified independent   Upper Body Bathing Moderate assistance   Lower Body Bathing Moderate assistance   Upper Body Dressing Moderate assistance   Lower Body Dressing Increased time   Toilet Tranfer Modified independent   Toileting - Clothing Manipulation Increased time   Toileting -  Hygiene Increase time   Tub/Shower Transfer Minimal assistance   Transfers/Ambulation Related to ADL's independent   IADL   Prior Level of Function Shopping independent   Shopping Needs to be accompanied on any shopping trip   Prior Level of Function Light Housekeeping independent    Light Housekeeping Needs help with all home maintenance tasks   Prior Level of Function Meal Prep independent   Meal Prep Able to complete simple cold meal and snack prep   Community Mobility Relies on family or friends for transportation   Medication Management Is responsible for taking medication in correct dosages at correct time   Prior Level of Function Financial Management independent   Landscape architectinancial Management Manages day-to-day purchases, but needs help with banking, major purchases, etc.   Mobility   Mobility Status Independent   Written Expression   Handwriting Increased time   Vision - History   Baseline Vision Wears glasses all the time   Cognition   Overall Cognitive Status Within Functional Limits for tasks assessed   Sensation   Light Touch Impaired Detail  right thumb numb, tingling in fingertips in both hands   Hot/Cold Appears Intact   Coordination   Gross Motor Movements are Fluid and Coordinated No   Fine Motor Movements are Fluid and Coordinated No   Finger Nose Finger Test unable   9 Hole Peg Test Right;Left   Right 9 Hole Peg Test 40   Left 9 Hole Peg Test 39   ROM / Strength   AROM / PROM / Strength AROM;PROM;Strength   AROM   Overall AROM  Deficits   Overall AROM Comments Right UE shoulder flexion limited to 47 degrees, ABD 25 degrees,B  full elbow extension,  initiation of elbow flexion, full wrist motion. B shoulder elevation 1/2 range, retraction 3/4 range.  Full fisting bilaterally, opposition intact.  Left shoulder flexion 58 degrees, ABD 35 degrees.    PROM   Overall PROM  Within functional limits for tasks performed   Strength   Overall Strength Deficits   Overall Strength Comments Unable to test bilateral shoulder strength due to lack of motion, bilateral wrists 5/5 strength.     Hand Function   Right Hand Grip (lbs) 57   Right Hand Lateral Pinch 20 lbs   Right Hand 3 Point Pinch 15 lbs   Left Hand Grip (lbs) 40   Left Hand Lateral Pinch 19 lbs   Left  3 point pinch 15 lbs   Sensation Exercises   Stereognosis intact            Patient instructed on ROM exercises to be performed in supine for shoulder.  Instructed caregiver on positioning of self and support to help take the weight of the patients arms to assist with AAROM as well as PROM for home.              OT Education - 02/28/16 1526    Education provided Yes   Education Details Role of OT, HEP for BUE ROM   Person(s) Educated Patient   Methods Explanation;Demonstration;Verbal cues   Comprehension Verbal cues required;Returned demonstration;Verbalized understanding             OT Long Term Goals - 02/28/16 1542    OT LONG TERM GOAL #1   Title Patient will improve active elbow ROM by 90 degrees to be able to feed self without difficulty.     Baseline has difficulty bringing hand to mouth, has to compensate by bringing head down to hand.   Time 8   Period Weeks   Status New   OT LONG TERM GOAL #2   Title Patient willl complete UB dressing with modified independence.   Baseline moderate assist at eval   Time 8   Period Weeks   Status New   OT LONG TERM GOAL #3   Title Patient will complete lower body dressing with modified independence.     Baseline min assist    Time 8   Period Weeks   Status New   OT LONG TERM GOAL #4   Title Patient will  complete tub/shower transfer with modified independence.    Baseline min assist   Time 8   Period Weeks   Status New   OT LONG TERM GOAL #5   Title Patient will complete UB/LB bathing with modified independence.    Baseline moderate assist   Time 8   Period Weeks   Long Term Additional Goals   Additional Long Term Goals Yes   OT LONG TERM GOAL #6   Title Patient will complete light homemaking tasks with min assist   Baseline moderate to max assist    Time 8   Period Weeks   Status New               Plan - 02/28/16 1527    Clinical Impression Statement Patient is a 60 yo male with a diagnosis of cervical stenosis with myelopathy and underwent surgery for ACDF C3 -C7 on 12/05/15 and was discharged home on 12/07/2015, he subsequently had complications, neck edema and difficulty breathing and swallowing and was readmitted to the hospital on 12/20/2015 and had to have surgery for a large abcess, he went to IP rehab and was discharged on 01/04/2016 and now presents for outpatient OT.  Patient was evaluated this date and presents with muscle weakness in BUE shoulders, elbows, decreased active ROM, decreased coordination and decreased ability to perform basic self care tasks such as bathing, dressing, tub/shower transfers and IADL tasks such as homemaking, shopping, driving and meal preparation.  He would benefit from skilled OT to address these skills to increase independence in necessary daily tasks.     Rehab Potential Good   OT Frequency 2x / week   OT Duration 8 weeks   OT Treatment/Interventions Self-care/ADL training;Therapeutic exercise;Patient/family education;Neuromuscular education;Manual Therapy;Therapeutic exercises;DME and/or AE instruction;Therapeutic activities;Moist Heat;Passive range of motion   Consulted and Agree with Plan  of Care Patient      Patient will benefit from skilled therapeutic intervention in order to improve the following deficits and impairments:   Decreased endurance, Decreased activity tolerance, Decreased knowledge of use of DME, Decreased strength, Impaired flexibility, Decreased range of motion, Pain, Decreased coordination, Impaired UE functional use (no pain this date but has pain at times.)  Visit Diagnosis: Muscle weakness (generalized) - Plan: Ot plan of care cert/re-cert  Other lack of coordination - Plan: Ot plan of care cert/re-cert    Problem List Patient Active Problem List   Diagnosis Date Noted  . Abscess in epidural space of cervical spine 01/28/2016  . Retropharyngeal abscess 01/28/2016  . Hardware complicating wound infection (HCC) 01/28/2016  . Dysphagia 01/01/2016  . Cervical spondylosis with myelopathy 12/27/2015  . Uncontrolled type 2 diabetes mellitus with complication, with long-term current use of insulin (HCC)   . Chronic hepatitis C without hepatic coma (HCC)   . H/O cervical spine surgery   . Tachycardia   . Tachypnea   . Hypokalemia   . Encounter for nasogastric (NG) tube placement   . Neck abscess   . Brodie's abscess of cervical spine (HCC) 12/20/2015  . Abscess, neck   . Endotracheal tube present   . Post-operative wound abscess   . Respiratory failure with hypoxia (HCC)   . Cervical spinal stenosis 12/05/2015  . AA (alcohol abuse) 10/11/2015  . Esophageal reflux   . Alcohol abuse   . DKA (diabetic ketoacidoses) (HCC) 08/09/2015  . Chest pain 08/09/2015  . Hyponatremia 08/09/2015  . Essential hypertension 08/09/2015  . Migraine with aura 08/09/2015  . Insomnia 08/09/2015  . Chronic pain 08/09/2015  . Diabetes mellitus (HCC) 08/09/2015  . Diabetic ketoacidosis without coma associated with type 2 diabetes mellitus (HCC)   . Weakness of left lower extremity   . Cervical radiculopathy at C5 08/03/2015  . Left upper extremity numbness 08/03/2015  . Right upper extremity numbness 08/03/2015  . Uncontrolled type 2 diabetes mellitus with background retinopathy (HCC) 07/19/2015  .  Degenerative arthritis of thumb 07/10/2015  . Chronic radicular cervical pain 06/08/2015  . Degeneration of lumbar/lumbosacral disc without myelopathy 06/08/2015  . Degeneration of intervertebral disc of lumbosacral region 06/08/2015  . Metabolic syndrome 05/31/2015  . Knee pain 07/12/2014  . Balanitis 07/12/2014  . History of cocaine use 07/12/2014  . Depression 07/12/2014  . Chronic pain of multiple joints 07/12/2014  . Hx-TIA (transient ischemic attack) 07/12/2014  . Hx of migraine headaches 07/12/2014  . Obesity, Class I, BMI 30.0-34.9 (see actual BMI) 07/12/2014  . Cocaine abuse 07/12/2014  . Major depressive disorder with single episode (HCC) 07/12/2014  . Adiposity 07/12/2014  . Ache in joint 07/12/2014  . H/O disease 07/12/2014  . Personal history of transient ischemic attack (TIA), and cerebral infarction without residual deficits 07/12/2014  . Chronic hepatitis C (HCC) 07/11/2014  . Hyperlipidemia LDL goal <100 07/11/2014  . Chronic hepatitis C virus infection (HCC) 07/11/2014  . HLD (hyperlipidemia) 07/11/2014  . Uncontrolled type 2 diabetes mellitus with peripheral circulatory disorder (HCC) 12/02/2013  . Hypertension, goal below 140/90 12/02/2013  . Essential (primary) hypertension 12/02/2013  . Type 2 diabetes mellitus with peripheral angiopathy (HCC) 12/02/2013   Amy T Lovett, OTR/L, CLT  Lovett,Amy 02/28/2016, 4:15 PM  [facility] MAIN [facility] SERVICES [address] [place], [place], [address] Phone: [phone]   Fax:  [phone]  Name: William [name] MRN: [id number] Date of Birth: 1956/08/18

## 2016-02-28 NOTE — Addendum Note (Signed)
Addended by: Mariea ClontsGREEN, Louella Medaglia D on: 02/28/2016 04:10 PM   Modules accepted: Orders

## 2016-02-28 NOTE — Addendum Note (Signed)
Addended by: Lurlean LeydenPOOLE, Kelleigh Skerritt F on: 02/28/2016 05:12 PM   Modules accepted: Orders

## 2016-02-28 NOTE — Progress Notes (Signed)
HPI: William [name] is a 60 y.o. male who is here for his pharmacy follow up of his hep C meds.   Lab Results  Component Value Date   HCVGENOTYPE 1a 01/28/2016    Allergies: Allergies  Allergen Reactions  . Metformin And Related Other (See Comments)    Possible liver inflammation  . Prednisone Other (See Comments)    Disoriented   . Tamsulosin Other (See Comments)    Nose bleed  . Other Other (See Comments)    Reports all narcotics make him sick. Pt premeds with Reglan to stop nausea    Vitals:    Past Medical History: Past Medical History  Diagnosis Date  . Hypertension   . Diabetes mellitus without complication (HCC)   . Chronic hepatitis C without hepatic coma (HCC)   . History of cocaine use   . Depression   . Arthritis   . History of TIA (transient ischemic attack)   . Hyperlipidemia   . History of migraine headaches   . Obesity, Class II, BMI 35-39.9   . BPH with obstruction/lower urinary tract symptoms   . Balanitis   . Urinary retention   . Tobacco abuse   . Abscess in epidural space of cervical spine 01/28/2016  . Retropharyngeal abscess 01/28/2016  . Hardware complicating wound infection (HCC) 01/28/2016    Social History: Social History   Social History  . Marital Status: Divorced    Spouse Name: N/A  . Number of Children: N/A  . Years of Education: N/A   Social History Main Topics  . Smoking status: Current Every Day Smoker -- 0.10 packs/day for 30 years    Types: Cigarettes    Last Attempt to Quit: 09/27/2014  . Smokeless tobacco: Not on file  . Alcohol Use: 0.0 oz/week    0 Standard drinks or equivalent per week  . Drug Use: Yes     Comment: history of drug use, admitted 04/14/13 for cocaine use   . Sexual Activity:    Partners: Female    Pharmacist, hospital Protection: Condom   Other Topics Concern  . Not on file   Social History Narrative    Labs: No results found for: HIV1RNAQUANT, HIV1RNAVL, CD4TABS, HEPBSAB, HEPBSAG, HCVAB  Lab  Results  Component Value Date   HCVGENOTYPE 1a 01/28/2016    Hepatitis C RNA quantitative Latest Ref Rng 01/28/2016  HCV Quantitative <15 IU/mL 16109604(V)  HCV Quantitative Log <1.18 log 10 7.08(H)    AST (U/L)  Date Value  12/28/2015 115*  12/21/2015 194*  12/17/2015 325*   SGOT(AST) (Unit/L)  Date Value  02/28/2014 152*  01/14/2014 168*   ALT (U/L)  Date Value  01/28/2016 92*  12/28/2015 278*  12/21/2015 328*  12/17/2015 430*   SGPT (ALT) (U/L)  Date Value  02/28/2014 318*  01/14/2014 437*   INR (no units)  Date Value  12/09/2015 1.12  08/09/2015 1.23    CrCl: CrCl cannot be calculated (Patient has no serum creatinine result on file.).  Fibrosis Score: F4 as assessed by Fibrosure   Child-Pugh Score: Class A  Previous Treatment Regimen: None  Assessment: William [name] is on is 3rd week of his Zepatier. He has NS5A neg virus. He has not missed any doses so far but he does have some difficulty swallowing the tablet. Early on, he did cut his tablets for a couple of days. His wife told him to stop doing it. There is no info on cutting or crushing zepatier but the comp has stated that  there is no special formulation to it. Since then he has been coating it with some applesauce and he is doing much better with it. He is on medicaid so we'll get a VL today to get the extension. We have never checked his hep A or B so we'll get that today too. Going to give him his PNA shot today since he has cirrhosis.  Recommendations:  Cont zepatier 1 PO qday x 3 months Hep C VL today, CMP Hep A/B antigen/ab PNA vaccine  Ulyses [place], Jonothan Heberle ChestnutQuang, VermontPharm.D., BCPS, AAHIVP Clinical Infectious Disease Pharmacist [facility] 02/28/2016, 3:03 PM

## 2016-02-28 NOTE — Patient Instructions (Signed)
Continue Zepatier 1 daily x 3 months Lab today

## 2016-02-29 LAB — HEPATITIS C RNA QUANTITATIVE
HCV Quantitative Log: <1.18 {Log} (ref <1.18)
HCV Quantitative: <15 IU/mL (ref <15)

## 2016-02-29 LAB — HEPATITIS B SURFACE ANTIBODY,QUALITATIVE: Hep B S Ab: NEGATIVE

## 2016-02-29 LAB — HEPATITIS B SURFACE ANTIGEN: HEP B S AG: NEGATIVE

## 2016-02-29 LAB — HEPATITIS A ANTIBODY, TOTAL: Hep A Total Ab: NONREACTIVE

## 2016-03-04 ENCOUNTER — Ambulatory Visit (HOSPITAL_COMMUNITY): Payer: Medicaid Other

## 2016-03-05 MED FILL — ZEPATIER 50-100 MG TABLET: 50-100 | 28 days supply | Qty: 28 | Fill #1

## 2016-03-10 ENCOUNTER — Ambulatory Visit (INDEPENDENT_AMBULATORY_CARE_PROVIDER_SITE_OTHER): Payer: Medicaid Other | Admitting: Infectious Disease

## 2016-03-10 ENCOUNTER — Encounter: Payer: Self-pay | Admitting: Infectious Disease

## 2016-03-10 VITALS — BP 115/83 | HR 98 | Temp 98.7°F | Ht 70.0 in | Wt 228.0 lb

## 2016-03-10 DIAGNOSIS — Z23 Encounter for immunization: Secondary | ICD-10-CM | POA: Diagnosis not present

## 2016-03-10 DIAGNOSIS — T847XXD Infection and inflammatory reaction due to other internal orthopedic prosthetic devices, implants and grafts, subsequent encounter: Secondary | ICD-10-CM | POA: Diagnosis not present

## 2016-03-10 DIAGNOSIS — L0211 Cutaneous abscess of neck: Secondary | ICD-10-CM | POA: Diagnosis not present

## 2016-03-10 DIAGNOSIS — B182 Chronic viral hepatitis C: Secondary | ICD-10-CM | POA: Diagnosis present

## 2016-03-10 NOTE — Progress Notes (Signed)
Subjective:   Chief complaint: difficulty raising arms that has persisted since before his first surgery   Patient ID: William [name], male    DOB: Sep 20, 1956, 60 y.o.   MRN: [id number]  HPI   60 year old African American man with history of severe stenosis at C3-4, 4-5 and 5-6 who underwent multilevel discectomy and fusion who presented with C5 radiculopathy redness swelling at operative site, sent to the ED where CT concerning for operative site to retropharyngeal space abscess, taken to the OR by Dr. Jeral FruitBotero on 12/20/15 after vancomycin and zosyn were started.   Dr. Jeral FruitBotero performed : Exploration of the cervical wound drain. Drainage of a large collection of fluid. Inspection of the cervical plate. Inspection of the esophagus. Irrigation of the esophagus with methylene blue.   Operative cultures failed to yield an organism. He was seen by Dr. Luciana Axeomer and narrowed to vancomycin and ceftriaxone and en course to complete IV abx on April 26th.   His neck pain has improved dramatically. He still cannot raise his arms but no new Neuro findings.   We have continued him on Bactrim double strength twice daily since then and we have also initiated treatment for his hepatitis C with zepatier and he had HCV VL <15 one month in. He is on his 2nd bottle at present.  Past Medical History  Diagnosis Date  . Hypertension   . Diabetes mellitus without complication (HCC)   . Chronic hepatitis C without hepatic coma (HCC)   . History of cocaine use   . Depression   . Arthritis   . History of TIA (transient ischemic attack)   . Hyperlipidemia   . History of migraine headaches   . Obesity, Class II, BMI 35-39.9   . BPH with obstruction/lower urinary tract symptoms   . Balanitis   . Urinary retention   . Tobacco abuse   . Abscess in epidural space of cervical spine 01/28/2016  . Retropharyngeal abscess 01/28/2016  . Hardware complicating wound infection (HCC) 01/28/2016    Past Surgical History    Procedure Laterality Date  . Rotator  cuff surgery       left shoulder   . Spider bite      on neck  . Anterior cervical decomp/discectomy fusion N/A 12/05/2015    Procedure: Anterior Cervical Decompression Fusion Cervical three-four, Cervical four-five, Cervical five-six, Cervical six-seven;  Surgeon: Julio SicksHenry Pool, MD;  Location: [facility];  Service: Neurosurgery;  Laterality: N/A;  . Anterior cervical decompression for epidural abscess N/A 12/20/2015    Procedure: ANTERIOR CERVICAL DECOMPRESSION FOR EPIDURAL ABSCESS;  Surgeon: Hilda LiasErnesto Botero, MD;  Location: [facility];  Service: Neurosurgery;  Laterality: N/A;  ANTERIOR CERVICAL DECOMPRESSION FOR EPIDURAL ABSCESS    Family History  Problem Relation Age of Onset  . Hypertension Mother   . Diabetes Mother   . Heart disease Mother   . Thyroid disease Mother   . Hypertension Father   . Diabetes Father   . Heart disease Father       Social History   Social History  . Marital Status: Divorced    Spouse Name: N/A  . Number of Children: N/A  . Years of Education: N/A   Social History Main Topics  . Smoking status: Former Smoker -- 0.10 packs/day for 30 years    Types: Cigarettes    Quit date: 12/09/2015  . Smokeless tobacco: Never Used  . Alcohol Use: No  . Drug Use: No     Comment:  history of drug use, admitted 04/14/13 for cocaine use   . Sexual Activity:    Partners: Female    Pharmacist, hospital Protection: Condom   Other Topics Concern  . None   Social History Narrative    Allergies  Allergen Reactions  . Metformin And Related Other (See Comments)    Possible liver inflammation  . Prednisone Other (See Comments)    Disoriented   . Tamsulosin Other (See Comments)    Nose bleed  . Other Other (See Comments)    Reports all narcotics make him sick. Pt premeds with Reglan to stop nausea     Current outpatient prescriptions:  .  amLODipine (NORVASC) 10 MG tablet, Take 1 tablet (10 mg total) by mouth daily., Disp:  30 tablet, Rfl: 3 .  BD PEN NEEDLE NANO U/F 32G X 4 MM MISC, USE WITH FLEXPEN TO ADMINISTER INSULIN, Disp: 100 each, Rfl: 5 .  Elbasvir-Grazoprevir (ZEPATIER) 50-100 MG TABS, Take 1 tablet by mouth daily., Disp: 28 tablet, Rfl: 3 .  gabapentin (NEURONTIN) 300 MG capsule, Take 1 capsule (300 mg total) by mouth 3 (three) times daily., Disp: 90 capsule, Rfl: 3 .  glucose blood (ACCU-CHEK AVIVA) test strip, Use as instructed to check blood glucose 4x/day, Disp: 200 each, Rfl: 12 .  insulin aspart (NOVOLOG FLEXPEN) 100 UNIT/ML FlexPen, Inject 15 Units into the skin 3 (three) times daily with meals. (Patient taking differently: Inject 25 Units into the skin 3 (three) times daily with meals. ), Disp: 8 pen, Rfl: 5 .  insulin glargine (LANTUS) 100 UNIT/ML injection, Inject 0.35 mLs (35 Units total) into the skin at bedtime., Disp: 10 mL, Rfl: 11 .  insulin glargine (LANTUS) 100 UNIT/ML injection, Inject 0.1 mLs (10 Units total) into the skin daily., Disp: 10 mL, Rfl: 11 .  Insulin Pen Needle (EASY TOUCH PEN NEEDLES) 32G X 6 MM MISC, Use with Flexpen to administer Insulin, Disp: 100 each, Rfl: 11 .  meloxicam (MOBIC) 7.5 MG tablet, Take 1 tablet (7.5 mg total) by mouth daily., Disp: 7 tablet, Rfl: 2 .  oxyCODONE (OXY IR/ROXICODONE) 5 MG immediate release tablet, Take 1 tablet (5 mg total) by mouth every 6 (six) hours as needed for severe pain., Disp: 75 tablet, Rfl: 0 .  pantoprazole (PROTONIX) 40 MG tablet, Take 1 tablet (40 mg total) by mouth 2 (two) times daily. Twice a day for 10 days; then start using it once a day, Disp: 60 tablet, Rfl: 1 .  sulfamethoxazole-trimethoprim (BACTRIM DS,SEPTRA DS) 800-160 MG tablet, Take 1 tablet by mouth 2 (two) times daily., Disp: 60 tablet, Rfl: 11 .  Lancets (FREESTYLE) lancets, Use as instructed to check blood glucose 4x/day, Disp: 200 each, Rfl: 12    Review of Systems  Constitutional: Negative for fever, chills, diaphoresis, activity change, appetite change,  fatigue and unexpected weight change.  HENT: Negative for congestion, rhinorrhea, sinus pressure, sneezing, sore throat and trouble swallowing.   Eyes: Negative for photophobia and visual disturbance.  Respiratory: Negative for cough, chest tightness, shortness of breath, wheezing and stridor.   Cardiovascular: Negative for chest pain, palpitations and leg swelling.  Gastrointestinal: Negative for nausea, vomiting, abdominal pain, diarrhea, constipation, blood in stool, abdominal distention and anal bleeding.  Genitourinary: Negative for dysuria, hematuria, flank pain and difficulty urinating.  Musculoskeletal: Negative for myalgias, back pain, joint swelling, arthralgias and gait problem.  Skin: Negative for color change, pallor, rash and wound.  Neurological: Positive for weakness. Negative for dizziness, tremors and light-headedness.  Hematological:  Negative for adenopathy. Does not bruise/bleed easily.  Psychiatric/Behavioral: Negative for behavioral problems, confusion, sleep disturbance, dysphoric mood, decreased concentration and agitation.       Objective:   Physical Exam  Constitutional: He is oriented to person, place, and time. He appears well-developed and well-nourished.  HENT:  Head: Normocephalic and atraumatic.  Eyes: Conjunctivae and EOM are normal.  Neck: Normal range of motion. Neck supple.    Cardiovascular: Normal rate and regular rhythm.   Pulmonary/Chest: Effort normal. No respiratory distress. He has no wheezes.  Abdominal: Soft. He exhibits no distension. There is no tenderness. There is no rebound and no guarding.  Musculoskeletal: Normal range of motion. He exhibits no edema or tenderness.  Neurological: He is alert and oriented to person, place, and time.  Limited ability to abduct arms elevate them to raise hand to grip mine to shake hands  Skin: Skin is warm and dry. No rash noted. No erythema.  Psychiatric: He has a normal mood and affect. His behavior is  normal. Judgment and thought content normal.          Assessment & Plan:   Post-operative abscess in cervical spine area complicated by presence of hardware and extending to retropharyngeal space sp I and D and now IV abx  --Continue Bactrim DS BID for minimum of a year if not even longer postsurgically  Chronic hepatitis C without hepatic coma: He denies IVDU. He denies any recreational drug use, He does not drink etoh. He quit smoking . He believes he may have contracted HCV from prior girlfriend. He has not had a blood transfusion before.  He is doing well on zepatier, will give him Hep A and B vaccine today Hep B vaccine #2 in a month, see him in 3 months and then 5 months to check SVR 12   I spent greater than 25  minutes with the patient including greater than 50% of time in face to face counsel of the patient's cervical spine abscess with hardware, chronic hepatitis c without hepatic comat and in coordination of his care.

## 2016-03-12 ENCOUNTER — Encounter: Payer: Medicaid Other | Attending: Physical Medicine & Rehabilitation | Admitting: Physical Medicine & Rehabilitation

## 2016-03-12 ENCOUNTER — Encounter: Payer: Self-pay | Admitting: Physical Medicine & Rehabilitation

## 2016-03-12 VITALS — BP 149/101 | HR 95

## 2016-03-12 DIAGNOSIS — Z6835 Body mass index (BMI) 35.0-35.9, adult: Secondary | ICD-10-CM | POA: Diagnosis not present

## 2016-03-12 DIAGNOSIS — IMO0002 Reserved for concepts with insufficient information to code with codable children: Secondary | ICD-10-CM

## 2016-03-12 DIAGNOSIS — R202 Paresthesia of skin: Secondary | ICD-10-CM | POA: Insufficient documentation

## 2016-03-12 DIAGNOSIS — B182 Chronic viral hepatitis C: Secondary | ICD-10-CM | POA: Diagnosis not present

## 2016-03-12 DIAGNOSIS — F17210 Nicotine dependence, cigarettes, uncomplicated: Secondary | ICD-10-CM | POA: Diagnosis not present

## 2016-03-12 DIAGNOSIS — M4802 Spinal stenosis, cervical region: Secondary | ICD-10-CM | POA: Insufficient documentation

## 2016-03-12 DIAGNOSIS — G8929 Other chronic pain: Secondary | ICD-10-CM | POA: Insufficient documentation

## 2016-03-12 DIAGNOSIS — R1310 Dysphagia, unspecified: Secondary | ICD-10-CM | POA: Diagnosis not present

## 2016-03-12 DIAGNOSIS — Z79899 Other long term (current) drug therapy: Secondary | ICD-10-CM | POA: Insufficient documentation

## 2016-03-12 DIAGNOSIS — E1165 Type 2 diabetes mellitus with hyperglycemia: Secondary | ICD-10-CM

## 2016-03-12 DIAGNOSIS — L0211 Cutaneous abscess of neck: Secondary | ICD-10-CM | POA: Diagnosis not present

## 2016-03-12 DIAGNOSIS — E785 Hyperlipidemia, unspecified: Secondary | ICD-10-CM | POA: Diagnosis not present

## 2016-03-12 DIAGNOSIS — F1421 Cocaine dependence, in remission: Secondary | ICD-10-CM | POA: Diagnosis not present

## 2016-03-12 DIAGNOSIS — M4712 Other spondylosis with myelopathy, cervical region: Secondary | ICD-10-CM

## 2016-03-12 DIAGNOSIS — Z8673 Personal history of transient ischemic attack (TIA), and cerebral infarction without residual deficits: Secondary | ICD-10-CM | POA: Insufficient documentation

## 2016-03-12 DIAGNOSIS — M868X8 Other osteomyelitis, other site: Secondary | ICD-10-CM

## 2016-03-12 DIAGNOSIS — I10 Essential (primary) hypertension: Secondary | ICD-10-CM | POA: Diagnosis not present

## 2016-03-12 DIAGNOSIS — Z9889 Other specified postprocedural states: Secondary | ICD-10-CM | POA: Diagnosis not present

## 2016-03-12 DIAGNOSIS — M1990 Unspecified osteoarthritis, unspecified site: Secondary | ICD-10-CM | POA: Insufficient documentation

## 2016-03-12 DIAGNOSIS — T814XXA Infection following a procedure, initial encounter: Secondary | ICD-10-CM | POA: Insufficient documentation

## 2016-03-12 DIAGNOSIS — E119 Type 2 diabetes mellitus without complications: Secondary | ICD-10-CM | POA: Diagnosis not present

## 2016-03-12 DIAGNOSIS — M5412 Radiculopathy, cervical region: Secondary | ICD-10-CM | POA: Diagnosis not present

## 2016-03-12 DIAGNOSIS — E1151 Type 2 diabetes mellitus with diabetic peripheral angiopathy without gangrene: Secondary | ICD-10-CM | POA: Diagnosis not present

## 2016-03-12 DIAGNOSIS — E669 Obesity, unspecified: Secondary | ICD-10-CM | POA: Diagnosis not present

## 2016-03-12 DIAGNOSIS — M50022 Cervical disc disorder at C5-C6 level with myelopathy: Secondary | ICD-10-CM | POA: Insufficient documentation

## 2016-03-12 MED ORDER — GABAPENTIN 300 MG PO CAPS: 600.0000 mg | ORAL_CAPSULE | Freq: Three times a day (TID) | ORAL | Status: AC

## 2016-03-12 MED ORDER — OXYCODONE HCL 5 MG PO TABS: 5.0000 mg | ORAL_TABLET | Freq: Four times a day (QID) | ORAL | Status: DC | PRN

## 2016-03-12 NOTE — Patient Instructions (Signed)
GABAPENTIN 300-300-600MG  FOR 4 DAYS THEN 600-300-600MG  FOR 4 DAYS THEN 600MG  THREE X DAILY  PLEASE CALL ME WITH ANY PROBLEMS OR QUESTIONS [phone]).

## 2016-03-12 NOTE — Progress Notes (Signed)
Subjective:    Patient ID: William [name], male    DOB: 10/08/1955, 60 y.o.   MRN: [id number]  HPI   MR.Mcmath is here in follow up of his c5 radiculopathy. He hasn't had any more therapy due to some issues with his MCD. He is awaiting to resume at [facility]. He is doing some exercises of his own currently. His pain levels have been generally better with the incrased gabapentin. He did go to the ED last month with a pain flare however and work up was negative. He tries not to use much oxycodone but still takes it for more severe pain.    Pain Inventory Average Pain 7 Pain Right Now 4 My pain is constant and aching  In the last 24 hours, has pain interfered with the following? General activity 6 Relation with others 2 Enjoyment of life 8 What TIME of day is your pain at its worst? all times Sleep (in general) Poor  Pain is worse with: walking and bending Pain improves with: medication Relief from Meds: 2  Mobility walk without assistance how many minutes can you walk? 3 ability to climb steps?  no do you drive?  no transfers alone Do you have any goals in this area?  yes  Function disabled: date disabled 2014 I need assistance with the following:  dressing, meal prep and household duties Do you have any goals in this area?  no  Neuro/Psych tingling  Prior Studies Any changes since last visit?  no  Physicians involved in your care Any changes since last visit?  no   Family History  Problem Relation Age of Onset  . Hypertension Mother   . Diabetes Mother   . Heart disease Mother   . Thyroid disease Mother   . Hypertension Father   . Diabetes Father   . Heart disease Father    Social History   Social History  . Marital Status: Divorced    Spouse Name: N/A  . Number of Children: N/A  . Years of Education: N/A   Social History Main Topics  . Smoking status: Former Smoker -- 0.10 packs/day for 30 years    Types: Cigarettes    Quit date: 12/09/2015  . Smokeless  tobacco: Never Used  . Alcohol Use: No  . Drug Use: No     Comment: history of drug use, admitted 04/14/13 for cocaine use   . Sexual Activity:    Partners: Female    Pharmacist, hospital Protection: Condom   Other Topics Concern  . None   Social History Narrative   Past Surgical History  Procedure Laterality Date  . Rotator  cuff surgery       left shoulder   . Spider bite      on neck  . Anterior cervical decomp/discectomy fusion N/A 12/05/2015    Procedure: Anterior Cervical Decompression Fusion Cervical three-four, Cervical four-five, Cervical five-six, Cervical six-seven;  Surgeon: Julio Sicks, MD;  Location: [facility];  Service: Neurosurgery;  Laterality: N/A;  . Anterior cervical decompression for epidural abscess N/A 12/20/2015    Procedure: ANTERIOR CERVICAL DECOMPRESSION FOR EPIDURAL ABSCESS;  Surgeon: Hilda Lias, MD;  Location: [facility];  Service: Neurosurgery;  Laterality: N/A;  ANTERIOR CERVICAL DECOMPRESSION FOR EPIDURAL ABSCESS   Past Medical History  Diagnosis Date  . Hypertension   . Diabetes mellitus without complication (HCC)   . Chronic hepatitis C without hepatic coma (HCC)   . History of cocaine use   . Depression   .  Arthritis   . History of TIA (transient ischemic attack)   . Hyperlipidemia   . History of migraine headaches   . Obesity, Class II, BMI 35-39.9   . BPH with obstruction/lower urinary tract symptoms   . Balanitis   . Urinary retention   . Tobacco abuse   . Abscess in epidural space of cervical spine 01/28/2016  . Retropharyngeal abscess 01/28/2016  . Hardware complicating wound infection (HCC) 01/28/2016   BP 149/101 mmHg  Pulse 95  SpO2 96%  Opioid Risk Score:   Fall Risk Score:  `1  Depression screen PHQ 2/9  [facility]/10[facility]2016 02/12/2016 08/03/2015 05/30/2015  Decreased Interest 0 0 0 0  Down, Depressed, Hopeless 0 0 0 0  PHQ - 2 Score 0 0 0 0      Review of Systems     Objective:   Physical  Exam  HEENT NGT. Staples in neck, incision cdi  General: No acute distress  Mood and affect are appropriate  Heart: Regular rate and rhythm no rubs murmurs or rubs  Lungs: Clear to auscultation, normal effort. no rales or wheezes  Abdomen: Positive bowel sounds, soft nontender to palpation, nondistended  Extremities: No clubbing, cyanosis, or edema  Skin: No evidence of breakdown, no evidence of rash  Neurologic: Cranial nerves II through XII intact, motor strength is tr to 1-/5 in bilateral deltoid, 2 to 3- bilateral biceps, 4= tricep, 5 grip,5 hip flexor, knee extensors, ankle dorsiflexor and plantar flexor--stable  Sensory exam normal sensation to light touch in bilateral upper and lower extremities  Cerebellar exam normal finger to nose to finger as well as heel to shin in bilateral upper and lower extremities  Musculoskeletal: Full range of motion in all 4 extremities. No joint swelling, Mild dorsum of the hand swelling/edema, nontender no erythema  Deep tendon reflexes are hyporeflexic bilateral upper and lower limbs  Cognitively he's appropriate  Psych: pleasant and appropriate   Assessment & Plan:   Medical Problem List and Plan:  1. Cervical myelopathy and bilateral C5 radiculopathy secondary to Cervical spinal stenosis and postoperative abscess  -he's making progress with his biceps strength but prognosis is poor for deltoid recovery.  -out[facility] OT [facility] when available -reviewed theraband exercises he could do   2. Dysphagia: discussed appropriate bite sizes, taking time, washing solids with fluids if needed.  3. Pain Management:  gabapentin to 300mg  TID--titrate to 600mg  TID.  -oxycodone for more severe pain--was refilled.   4. HTN: Monitor BP bid.  9. DMT2: Monitor BS ac/hs. Use SSI  - discussed adjusting his lantus in pm, adding back am dosing as well.  10. Neck abscess/hepatitis: per ID   Follow up in 2 months. Fifteen minutes of face to face patient care time were spent  during this visit. All questions were encouraged and answered.

## 2016-03-13 ENCOUNTER — Encounter: Payer: Self-pay | Admitting: Family Medicine

## 2016-03-13 ENCOUNTER — Ambulatory Visit (INDEPENDENT_AMBULATORY_CARE_PROVIDER_SITE_OTHER): Payer: Medicaid Other | Admitting: Family Medicine

## 2016-03-13 VITALS — BP 138/94 | HR 107 | Temp 98.6°F | Resp 16 | Ht 70.0 in | Wt 232.0 lb

## 2016-03-13 DIAGNOSIS — K449 Diaphragmatic hernia without obstruction or gangrene: Secondary | ICD-10-CM | POA: Diagnosis not present

## 2016-03-13 DIAGNOSIS — B182 Chronic viral hepatitis C: Secondary | ICD-10-CM | POA: Diagnosis not present

## 2016-03-13 DIAGNOSIS — E1165 Type 2 diabetes mellitus with hyperglycemia: Secondary | ICD-10-CM

## 2016-03-13 DIAGNOSIS — E785 Hyperlipidemia, unspecified: Secondary | ICD-10-CM

## 2016-03-13 DIAGNOSIS — E1151 Type 2 diabetes mellitus with diabetic peripheral angiopathy without gangrene: Secondary | ICD-10-CM

## 2016-03-13 DIAGNOSIS — I10 Essential (primary) hypertension: Secondary | ICD-10-CM | POA: Diagnosis not present

## 2016-03-13 DIAGNOSIS — M2550 Pain in unspecified joint: Secondary | ICD-10-CM | POA: Diagnosis not present

## 2016-03-13 DIAGNOSIS — Z7189 Other specified counseling: Secondary | ICD-10-CM

## 2016-03-13 DIAGNOSIS — IMO0002 Reserved for concepts with insufficient information to code with codable children: Secondary | ICD-10-CM

## 2016-03-13 DIAGNOSIS — Z7689 Persons encountering health services in other specified circumstances: Secondary | ICD-10-CM

## 2016-03-13 DIAGNOSIS — G8929 Other chronic pain: Secondary | ICD-10-CM

## 2016-03-13 DIAGNOSIS — N481 Balanitis: Secondary | ICD-10-CM | POA: Diagnosis not present

## 2016-03-13 LAB — POCT GLYCOSYLATED HEMOGLOBIN (HGB A1C): HEMOGLOBIN A1C: 11.4

## 2016-03-13 MED ORDER — MELOXICAM 7.5 MG PO TABS: 7.5000 mg | ORAL_TABLET | Freq: Every day | ORAL | Status: AC

## 2016-03-13 MED ORDER — LOSARTAN POTASSIUM 25 MG PO TABS: 25.0000 mg | ORAL_TABLET | Freq: Every day | ORAL | Status: DC

## 2016-03-13 MED ORDER — MELOXICAM 7.5 MG PO TABS: 7.5000 mg | ORAL_TABLET | Freq: Every day | ORAL | Status: DC

## 2016-03-13 MED ORDER — SUCRALFATE 1 G PO TABS: 1.0000 g | ORAL_TABLET | Freq: Three times a day (TID) | ORAL | Status: DC

## 2016-03-13 MED ORDER — FLUCONAZOLE 150 MG PO TABS: 150.0000 mg | ORAL_TABLET | Freq: Once | ORAL | Status: AC

## 2016-03-13 NOTE — Assessment & Plan Note (Signed)
Renewed meloxicam PRN for arthritis pain.

## 2016-03-13 NOTE — Assessment & Plan Note (Signed)
Increase mealtime insulin to 40 units at dinner. Recheck 4 weeks for sugar control.  Eye exam due 06/2015.

## 2016-03-13 NOTE — Assessment & Plan Note (Signed)
Managed by ID

## 2016-03-13 NOTE — Assessment & Plan Note (Signed)
Add ARB for renal protection. Check CMET. Recheck 4 weeks.

## 2016-03-13 NOTE — Patient Instructions (Signed)
Diabetes: 40 units nightly at dinnertime of Novolog. Keep taking Lantus 50 units at bedtime and 35 units of novolog with breakfast and lunch. Please check your blood glucose  3 times daily. If your glucose is < 70 mg/dl or you have symptoms of hypoglycemia confusion, dizziness, headache, hunger, jitteriness, pale skin color and sweating please drink 4 oz of juice or soda.  Check blood glucose 15 minutes later. If it has not risen to >100, please seek medical attention. If > 100 please eat a snack containing protein such as peanut butter and crackers.  Hiatal Hernia: Please schedule follow-up with Dr. Reyes IvanSkulskie's office. Balanitis: Be sure to make appt with Michiel CowboyShannon McGowan.   Your goal blood pressure is 140/90 Work on low salt/sodium diet - goal <1.5gm (1,500mg ) per day. Eat a diet high in fruits/vegetables and whole grains.  Look into mediterranean and DASH diet. Goal activity is 1850min/wk of moderate intensity exercise.  This can be split into 30 minute chunks.  If you are not at this level, you can start with smaller 10-15 min increments and slowly build up activity. Look at www.heart.org for more resources  Please seek immediate medical attention at ER or Urgent Care if you develop: Chest pain, pressure or tightness. Shortness of breath accompanied by nausea or diaphoresis Visual changes Numbness or tingling on one side of the body Facial droop Altered mental status Or any concerning symptoms.

## 2016-03-13 NOTE — Progress Notes (Signed)
Subjective:    Patient ID: William [name], male    DOB: Sep 11, 1956, 60 y.o.   MRN: [id number]  HPI: William [name] is a 60 y.o. male presenting on 03/13/2016 for Establish Care and Diabetes   HPI  Pt presents to establish care today. Previous care provider was Dr. Sherley Bounds- at [facility].  It has been 6 months since His last PCP visit. Records from previous provider will be requested and reviewed. Current medical problems include:  Recent neurosurgery and infection Currently seeing Dr. Daiva Eves ID and Riley Kill for pain management. Dr. Yetta Barre is his surgeon. Starts PT for arm paralysis following surgery. Wound infection.  Diabetes: Diagnosed in 2014. Last A1c was 11.1%. Currently taking 50 units of lantus. Taking 35 units of novolog TID. Avg sugars at home- highest was 390, avg 200. Lowest 78. Had symptoms at 78. Has previously had ketoacidosis- hospitalized . Has been 50 units lantus since December. Taking 35 units over the past 1 mos. Highest sugars are the evenings. Eye exam: Last year 06/2015 at [facility].  No numbness or tingling in feet- previously. Felt like he was walking on balls- got better when he started insulin. Checks his feet.  Hypertension: Diagnosed in 2008. Takes amlodipine daily. Does not check BP at home. No chest pain, no SOB, no dizziness. Has been on since 2011. Had an ACE cough. Concern about high pulse. Since leaving hospitalized.    Needs surgery on his groin- taking fluconazole 1 pill per month. Needs to see circumcised for balanitis. Uses nystatin ointment.  Needs total knee replacement.  Hep C- Currently being treated by Dr. Daiva Eves. Started treatment 1 mos ago. Is tolerating treatment. No history of IV drugs. Stopped metformin due to liver issues.  Arthritis in Arm- taking meloxicam  Hital hernia- taking protonix in the evening. Was taking carafate 4 times daily.   Health maintenance:  Colonosocpy: was scheduled- stopped due to surgery.  PSA checked in July 2016- follows  with Michiel Cowboy at [facility].     Past Medical History  Diagnosis Date  . Hypertension   . Diabetes mellitus without complication (HCC)   . Chronic hepatitis C without hepatic coma (HCC)   . History of cocaine use   . Depression   . Arthritis   . History of TIA (transient ischemic attack)   . Hyperlipidemia   . History of migraine headaches   . Obesity, Class II, BMI 35-39.9   . BPH with obstruction/lower urinary tract symptoms   . Balanitis   . Urinary retention   . Tobacco abuse   . Abscess in epidural space of cervical spine 01/28/2016  . Retropharyngeal abscess 01/28/2016  . Hardware complicating wound infection (HCC) 01/28/2016   Social History   Social History  . Marital Status: Divorced    Spouse Name: N/A  . Number of Children: N/A  . Years of Education: N/A   Occupational History  . Not on file.   Social History Main Topics  . Smoking status: Current Some Day Smoker -- 0.10 packs/day for 30 years    Types: Cigarettes    Last Attempt to Quit: 12/09/2015  . Smokeless tobacco: Current User  . Alcohol Use: No  . Drug Use: No     Comment: history of drug use, admitted 04/14/13 for cocaine use   . Sexual Activity:    Partners: Female    Pharmacist, hospital Protection: Condom   Other Topics Concern  . Not on file   Social History Narrative  Family History  Problem Relation Age of Onset  . Hypertension Mother   . Diabetes Mother   . Heart disease Mother   . Thyroid disease Mother   . Hypertension Father   . Diabetes Father   . Heart disease Father    Current Outpatient Prescriptions on File Prior to Visit  Medication Sig  . amLODipine (NORVASC) 10 MG tablet Take 1 tablet (10 mg total) by mouth daily.  . BD PEN NEEDLE NANO U/F 32G X 4 MM MISC USE WITH FLEXPEN TO ADMINISTER INSULIN  . Elbasvir-Grazoprevir (ZEPATIER) 50-100 MG TABS Take 1 tablet by mouth daily.  Marland Kitchen. gabapentin (NEURONTIN) 300 MG capsule Take 2 capsules (600 mg total) by mouth 3 (three) times  daily.  Marland Kitchen. glucose blood (ACCU-CHEK AVIVA) test strip Use as instructed to check blood glucose 4x/day  . insulin aspart (NOVOLOG FLEXPEN) 100 UNIT/ML FlexPen Inject 15 Units into the skin 3 (three) times daily with meals. (Patient taking differently: Inject 25 Units into the skin 3 (three) times daily with meals. )  . insulin glargine (LANTUS) 100 UNIT/ML injection Inject 0.35 mLs (35 Units total) into the skin at bedtime. (Patient taking differently: Inject 35 Units into the skin at bedtime. As per pt takes 50 units before bed.)  . Insulin Pen Needle (EASY TOUCH PEN NEEDLES) 32G X 6 MM MISC Use with Flexpen to administer Insulin  . Lancets (FREESTYLE) lancets Use as instructed to check blood glucose 4x/day  . oxyCODONE (OXY IR/ROXICODONE) 5 MG immediate release tablet Take 1 tablet (5 mg total) by mouth every 6 (six) hours as needed for severe pain.  . pantoprazole (PROTONIX) 40 MG tablet Take 1 tablet (40 mg total) by mouth 2 (two) times daily. Twice a day for 10 days; then start using it once a day  . sulfamethoxazole-trimethoprim (BACTRIM DS,SEPTRA DS) 800-160 MG tablet Take 1 tablet by mouth 2 (two) times daily.   No current facility-administered medications on file prior to visit.    Review of Systems Per HPI unless specifically indicated above     Objective:    BP 138/94 mmHg  Pulse 107  Temp(Src) 98.6 F (37 C) (Oral)  Resp 16  Ht 5\' 10"  (1.778 m)  Wt 232 lb (105.235 kg)  BMI 33.29 kg/m2  Wt Readings from Last 3 Encounters:  03/13/16 232 lb (105.235 kg)  03/10/16 228 lb (103.42 kg)  02/19/16 229 lb 5 oz (104.015 kg)    Physical Exam  Constitutional: He is oriented to person, place, and time. He appears well-developed and well-nourished. No distress.  HENT:  Head: Normocephalic and atraumatic.  Neck: Neck supple. No thyromegaly present.  Cardiovascular: Normal rate, regular rhythm and normal heart sounds.  Exam reveals no gallop and no friction rub.   No murmur  heard. Pulmonary/Chest: Effort normal and breath sounds normal. He has no wheezes.  Abdominal: Soft. Bowel sounds are normal. He exhibits no distension. There is no tenderness. There is no rebound.  Musculoskeletal: Normal range of motion. He exhibits no edema or tenderness.  Neurological: He is alert and oriented to person, place, and time. He has normal reflexes.  Skin: Skin is warm and dry. No rash noted. No erythema.  Psychiatric: He has a normal mood and affect. His behavior is normal. Thought content normal.   Results for orders placed or performed in visit on 03/13/16  POCT HgB A1C  Result Value Ref Range   Hemoglobin A1C 11.4       Assessment & Plan:  Problem List Items Addressed This Visit      Cardiovascular and Mediastinum   Uncontrolled type 2 diabetes mellitus with peripheral circulatory disorder (HCC) - Primary    Increase mealtime insulin to 40 units at dinner. Recheck 4 weeks for sugar control.  Eye exam due 06/2015.       Relevant Medications   LANTUS SOLOSTAR 100 UNIT/ML Solostar Pen   losartan (COZAAR) 25 MG tablet   Other Relevant Orders   POCT HgB A1C (Completed)   COMPLETE METABOLIC PANEL WITH GFR   Essential (primary) hypertension    Add ARB for renal protection. Check CMET. Recheck 4 weeks.       Relevant Medications   losartan (COZAAR) 25 MG tablet     Digestive   Chronic hepatitis C without hepatic coma (HCC)    Managed by ID.       Relevant Medications   fluconazole (DIFLUCAN) 150 MG tablet     Genitourinary   Balanitis    Pt should be seen by urology. Urged to make follow-up. Renewed fluconazole.       Relevant Medications   fluconazole (DIFLUCAN) 150 MG tablet     Other   Hyperlipidemia LDL goal <100    Check lipid panel.       Relevant Medications   losartan (COZAAR) 25 MG tablet   Other Relevant Orders   Lipid panel   Chronic pain of multiple joints    Renewed meloxicam PRN for arthritis pain.       Relevant Medications    cyclobenzaprine (FLEXERIL) 10 MG tablet   meloxicam (MOBIC) 7.5 MG tablet    Other Visit Diagnoses    Hiatal hernia        Relevant Medications    sucralfate (CARAFATE) 1 g tablet    Establishing care with new doctor, encounter for           Meds ordered this encounter  Medications  . cyclobenzaprine (FLEXERIL) 10 MG tablet    Sig: TAKE 1 TABLET (10 MG TOTAL) BY MOUTH 3 (THREE) TIMES DAILY AS NEEDED FOR MUSCLE SPASMS.    Refill:  3  . LANTUS SOLOSTAR 100 UNIT/ML Solostar Pen    Sig: INJECT 30 UNITS INTO THE SKIN 2 (TWO) TIMES DAILY.    Refill:  2  . DISCONTD: meloxicam (MOBIC) 7.5 MG tablet    Sig: Take 1 tablet (7.5 mg total) by mouth daily.    Dispense:  30 tablet    Refill:  2    Order Specific Question:  Supervising Provider    Answer:  Janeann Forehand [phone]  . sucralfate (CARAFATE) 1 g tablet    Sig: Take 1 tablet (1 g total) by mouth 4 (four) times daily -  with meals and at bedtime.    Dispense:  120 tablet    Refill:  1    Order Specific Question:  Supervising Provider    Answer:  Janeann Forehand [phone]  . fluconazole (DIFLUCAN) 150 MG tablet    Sig: Take 1 tablet (150 mg total) by mouth once.    Dispense:  4 tablet    Refill:  3    Order Specific Question:  Supervising Provider    Answer:  Janeann Forehand [[id number]]  . losartan (COZAAR) 25 MG tablet    Sig: Take 1 tablet (25 mg total) by mouth daily.    Dispense:  30 tablet    Refill:  11    Order Specific Question:  Supervising Provider    Answer:  Janeann Forehand [[id number]]  . meloxicam (MOBIC) 7.5 MG tablet    Sig: Take 1 tablet (7.5 mg total) by mouth daily.    Dispense:  30 tablet    Refill:  2    Order Specific Question:  Supervising Provider    Answer:  Janeann Forehand [[id number]]      Follow up plan: Return in about 4 weeks (around 04/10/2016) for diabetes, htn. Marland Kitchen

## 2016-03-13 NOTE — Assessment & Plan Note (Signed)
Pt should be seen by urology. Urged to make follow-up. Renewed fluconazole.

## 2016-03-13 NOTE — Assessment & Plan Note (Signed)
Check lipid panel  

## 2016-03-14 ENCOUNTER — Encounter: Payer: Medicaid Other | Admitting: Occupational Therapy

## 2016-03-17 ENCOUNTER — Ambulatory Visit: Payer: Medicaid Other | Admitting: Infectious Disease

## 2016-03-17 ENCOUNTER — Ambulatory Visit: Payer: Medicaid Other | Attending: Physical Medicine & Rehabilitation | Admitting: Occupational Therapy

## 2016-03-17 DIAGNOSIS — R278 Other lack of coordination: Secondary | ICD-10-CM | POA: Insufficient documentation

## 2016-03-17 DIAGNOSIS — M6281 Muscle weakness (generalized): Secondary | ICD-10-CM | POA: Diagnosis not present

## 2016-03-18 ENCOUNTER — Encounter: Payer: Self-pay | Admitting: Occupational Therapy

## 2016-03-18 ENCOUNTER — Other Ambulatory Visit: Payer: Self-pay | Admitting: Family Medicine

## 2016-03-18 MED ORDER — LOSARTAN POTASSIUM 25 MG PO TABS: 25.0000 mg | ORAL_TABLET | Freq: Every day | ORAL | Status: AC

## 2016-03-18 NOTE — Therapy (Signed)
[facility] MAIN [facility] SERVICES [address] [place], [place], [address] Phone: [phone]   Fax:  [phone]  Occupational Therapy Treatment  Patient Details  Name: William [name] MRN: [id number] Date of Birth: 03-Nov-1955 No Data Recorded  Encounter Date: 03/17/2016      OT End of Session - 03/18/16 1505    Visit Number 2   Number of Visits 4   Authorization Type medicaid visit 1 of 3 treatments.   OT Start Time 0930   OT Stop Time 1015   OT Time Calculation (min) 45 min   Activity Tolerance Patient tolerated treatment well   Behavior During Therapy WFL for tasks assessed/performed      Past Medical History  Diagnosis Date  . Hypertension   . Diabetes mellitus without complication (HCC)   . Chronic hepatitis C without hepatic coma (HCC)   . History of cocaine use   . Depression   . Arthritis   . History of TIA (transient ischemic attack)   . Hyperlipidemia   . History of migraine headaches   . Obesity, Class II, BMI 35-39.9   . BPH with obstruction/lower urinary tract symptoms   . Balanitis   . Urinary retention   . Tobacco abuse   . Abscess in epidural space of cervical spine 01/28/2016  . Retropharyngeal abscess 01/28/2016  . Hardware complicating wound infection (HCC) 01/28/2016    Past Surgical History  Procedure Laterality Date  . Rotator  cuff surgery       left shoulder   . Spider bite      on neck  . Anterior cervical decomp/discectomy fusion N/A 12/05/2015    Procedure: Anterior Cervical Decompression Fusion Cervical three-four, Cervical four-five, Cervical five-six, Cervical six-seven;  Surgeon: Julio Sicks, MD;  Location: [facility];  Service: Neurosurgery;  Laterality: N/A;  . Anterior cervical decompression for epidural abscess N/A 12/20/2015    Procedure: ANTERIOR CERVICAL DECOMPRESSION FOR EPIDURAL ABSCESS;  Surgeon: Hilda Lias, MD;  Location: [facility];  Service: Neurosurgery;  Laterality: N/A;  ANTERIOR  CERVICAL DECOMPRESSION FOR EPIDURAL ABSCESS    There were no vitals filed for this visit.      Subjective Assessment - 03/18/16 1503    Subjective  Patient reports he is glad he got approved for 3 visits but is concerned it will not be enough therapy.  Discussed the possibility of the [facility] clinic at [facility] after he is done with OT.    Pertinent History Patient is a 60 yo male with a diagnosis of cervical stenosis with myelopathy and underwent surgery for ACDF C3 -C7 on 12/05/15 and was discharged home on 12/07/2015, he subsequently had complications, neck edema and difficulty breathing and swallowing and was readmitted to the hospital on 12/20/2015 and had to have surgery for a large abcess, he went to IP rehab and was discharged on 01/04/2016 and now presents for outpatient OT.    Patient Stated Goals Wants to be independent, move his arms again, be able to care for himself.    Currently in Pain? No/denies   Pain Score 0-No pain   Multiple Pain Sites No                      OT Treatments/Exercises (OP) - 03/18/16 1528    Neurological Re-education Exercises   Other Exercises 1 Patient seen this date for focus on posture during daily tasks with emphasis on self care ADLs/IADL tasks.  Cues for head and  neck alignment with back, cues for chin tuck at times. BUE ROM and strengthening exercises as follows:  Patient seen in supine for gravity eliminated ROM for shoulder flexion, ABD, scaption, elbow flexion/extension with guiding from therapist and cues on each side, R/L.  Patient engaging in medium ball exercises for shoulder flexion, alternating ball touch to each shoulder and chest press for 2 sets of 10 reps each with cues and facilitation of movement.  Dowel exercises with 1# dowel with guiding from therapist, patient has difficulty completing motions without guiding at times, 10 reps for 2 sets for shoulder flexion, ABD, ADD, chest press and elbow flexion/ext.  In sitting, focused on  hand to mouth patterns for self feeding tasks, discussed difficulty with cutting of food. Will present some adaptive equipment next session to assist.    Sensation Exercises   Stereognosis intact                OT Education - 03/18/16 1504    Education provided Yes   Education Details exercises for ROM of Bilateral UE, HEP   Person(s) Educated Patient   Methods Explanation;Demonstration;Verbal cues   Comprehension Verbal cues required;Returned demonstration;Verbalized understanding             OT Long Term Goals - 02/28/16 1542    OT LONG TERM GOAL #1   Title Patient will improve active elbow ROM by 90 degrees to be able to feed self without difficulty.     Baseline has difficulty bringing hand to mouth, has to compensate by bringing head down to hand.   Time 8   Period Weeks   Status New   OT LONG TERM GOAL #2   Title Patient willl complete UB dressing with modified independence.   Baseline moderate assist at eval   Time 8   Period Weeks   Status New   OT LONG TERM GOAL #3   Title Patient will complete lower body dressing with modified independence.     Baseline min assist    Time 8   Period Weeks   Status New   OT LONG TERM GOAL #4   Title Patient will complete tub/shower transfer with modified independence.    Baseline min assist   Time 8   Period Weeks   Status New   OT LONG TERM GOAL #5   Title Patient will complete UB/LB bathing with modified independence.    Baseline moderate assist   Time 8   Period Weeks   Long Term Additional Goals   Additional Long Term Goals Yes   OT LONG TERM GOAL #6   Title Patient will complete light homemaking tasks with min assist   Baseline moderate to max assist    Time 8   Period Weeks   Status New               Plan - 03/18/16 1505    Clinical Impression Statement Patient reports he has felt better since coming for his eval, has been doing some exercises for ROM at home.  He responds well to cues for  exercises in the clinic.  He continues to demonstrate difficulty with performing self care tasks at home and would likely benefit from adaptive equipment training for select tasks such as cutting food.    Rehab Potential Good   OT Frequency 1x / week   OT Duration 4 weeks   OT Treatment/Interventions Self-care/ADL training;Therapeutic exercise;Patient/family education;Neuromuscular education;Manual Therapy;Therapeutic exercises;DME and/or AE instruction;Therapeutic activities;Moist Heat;Passive range of motion  Plan Frequency adjusted due to medicaid only approving 3 treatment visits   Consulted and Agree with Plan of Care Patient      Patient will benefit from skilled therapeutic intervention in order to improve the following deficits and impairments:  Decreased endurance, Decreased activity tolerance, Decreased knowledge of use of DME, Decreased strength, Impaired flexibility, Decreased range of motion, Pain, Decreased coordination, Impaired UE functional use  Visit Diagnosis: Muscle weakness (generalized)  Other lack of coordination    Problem List Patient Active Problem List   Diagnosis Date Noted  . Abscess in epidural space of cervical spine 01/28/2016  . Retropharyngeal abscess 01/28/2016  . Hardware complicating wound infection (HCC) 01/28/2016  . Dysphagia 01/01/2016  . Cervical spondylosis with myelopathy 12/27/2015  . Uncontrolled type 2 diabetes mellitus with complication, with long-term current use of insulin (HCC)   . Chronic hepatitis C without hepatic coma (HCC)   . H/O cervical spine surgery   . Tachycardia   . Tachypnea   . Hypokalemia   . Encounter for nasogastric (NG) tube placement   . Neck abscess   . Brodie's abscess of cervical spine (HCC) 12/20/2015  . Abscess, neck   . Endotracheal tube present   . Post-operative wound abscess   . Respiratory failure with hypoxia (HCC)   . Cervical spinal stenosis 12/05/2015  . AA (alcohol abuse) 10/11/2015  .  Esophageal reflux   . Alcohol abuse   . DKA (diabetic ketoacidoses) (HCC) 08/09/2015  . Chest pain 08/09/2015  . Hyponatremia 08/09/2015  . Essential hypertension 08/09/2015  . Migraine with aura 08/09/2015  . Insomnia 08/09/2015  . Chronic pain 08/09/2015  . Diabetes mellitus (HCC) 08/09/2015  . Diabetic ketoacidosis without coma associated with type 2 diabetes mellitus (HCC)   . Weakness of left lower extremity   . Cervical radiculopathy at C5 08/03/2015  . Left upper extremity numbness 08/03/2015  . Right upper extremity numbness 08/03/2015  . Uncontrolled type 2 diabetes mellitus with background retinopathy (HCC) 07/19/2015  . Degenerative arthritis of thumb 07/10/2015  . Chronic radicular cervical pain 06/08/2015  . Degeneration of lumbar/lumbosacral disc without myelopathy 06/08/2015  . Degeneration of intervertebral disc of lumbosacral region 06/08/2015  . Metabolic syndrome 05/31/2015  . Knee pain 07/12/2014  . Balanitis 07/12/2014  . History of cocaine use 07/12/2014  . Depression 07/12/2014  . Chronic pain of multiple joints 07/12/2014  . Hx-TIA (transient ischemic attack) 07/12/2014  . Hx of migraine headaches 07/12/2014  . Obesity, Class I, BMI 30.0-34.9 (see actual BMI) 07/12/2014  . Cocaine abuse 07/12/2014  . Major depressive disorder with single episode (HCC) 07/12/2014  . Adiposity 07/12/2014  . Ache in joint 07/12/2014  . H/O disease 07/12/2014  . Personal history of transient ischemic attack (TIA), and cerebral infarction without residual deficits 07/12/2014  . Chronic hepatitis C (HCC) 07/11/2014  . Hyperlipidemia LDL goal <100 07/11/2014  . Chronic hepatitis C virus infection (HCC) 07/11/2014  . HLD (hyperlipidemia) 07/11/2014  . Uncontrolled type 2 diabetes mellitus with peripheral circulatory disorder (HCC) 12/02/2013  . Hypertension, goal below 140/90 12/02/2013  . Essential (primary) hypertension 12/02/2013  . Type 2 diabetes mellitus with peripheral  angiopathy (HCC) 12/02/2013   Amy T Lovett, OTR/L, CLT  Lovett,Amy 03/18/2016, 3:32 PM  [facility] [address] [place], [place], [address] Phone: [phone]   Fax:  [phone]  Name: William [name] MRN: [id number] Date of Birth: Jan 19, 1956

## 2016-03-21 ENCOUNTER — Telehealth: Payer: Self-pay | Admitting: *Deleted

## 2016-03-21 NOTE — Telephone Encounter (Signed)
PA started for Losartan 25 mg. Pt must have tried and failed ace inhibitor. There is no documentation in chart. It is stated in a note patient had cough on ace. Case Id: [id number] Allow 24 hrs for processing. [phone]

## 2016-03-24 ENCOUNTER — Ambulatory Visit (INDEPENDENT_AMBULATORY_CARE_PROVIDER_SITE_OTHER): Payer: Medicaid Other | Admitting: *Deleted

## 2016-03-24 ENCOUNTER — Ambulatory Visit: Payer: Medicaid Other

## 2016-03-24 DIAGNOSIS — B1710 Acute hepatitis C without hepatic coma: Secondary | ICD-10-CM | POA: Diagnosis present

## 2016-03-24 DIAGNOSIS — Z23 Encounter for immunization: Secondary | ICD-10-CM | POA: Diagnosis not present

## 2016-03-26 ENCOUNTER — Encounter: Payer: Self-pay | Admitting: Occupational Therapy

## 2016-03-26 ENCOUNTER — Ambulatory Visit: Payer: Medicaid Other | Admitting: Occupational Therapy

## 2016-03-26 DIAGNOSIS — M6281 Muscle weakness (generalized): Secondary | ICD-10-CM | POA: Diagnosis not present

## 2016-03-26 DIAGNOSIS — R278 Other lack of coordination: Secondary | ICD-10-CM

## 2016-03-27 ENCOUNTER — Other Ambulatory Visit: Payer: Medicaid Other

## 2016-03-27 ENCOUNTER — Encounter: Payer: Self-pay | Admitting: Infectious Disease

## 2016-03-28 LAB — COMPLETE METABOLIC PANEL WITH GFR
ALT: 19 U/L (ref 9–46)
AST: 24 U/L (ref 10–35)
Albumin: 4 g/dL (ref 3.6–5.1)
Alkaline Phosphatase: 102 U/L (ref 40–115)
BILIRUBIN TOTAL: 0.4 mg/dL (ref 0.2–1.2)
BUN: 9 mg/dL (ref 7–25)
CHLORIDE: 98 mmol/L (ref 98–110)
CO2: 29 mmol/L (ref 20–31)
Calcium: 9.3 mg/dL (ref 8.6–10.3)
Creat: 0.86 mg/dL (ref 0.70–1.25)
GLUCOSE: 164 mg/dL — AB (ref 65–99)
Potassium: 3.8 mmol/L (ref 3.5–5.3)
SODIUM: 135 mmol/L (ref 135–146)
TOTAL PROTEIN: 6.7 g/dL (ref 6.1–8.1)

## 2016-03-28 LAB — LIPID PANEL
Cholesterol: 221 mg/dL — ABNORMAL HIGH (ref 125–200)
HDL: 28 mg/dL — AB (ref >=40)
LDL CALC: 136 mg/dL — AB (ref <130)
TRIGLYCERIDES: 285 mg/dL — AB (ref <150)
Total CHOL/HDL Ratio: 7.9 Ratio — ABNORMAL HIGH (ref <=5.0)
VLDL: 57 mg/dL — AB (ref <30)

## 2016-03-28 NOTE — Therapy (Signed)
[place] [facility] MAIN [facility] [address] [place], [place], [address] Phone: [phone]   Fax:  [phone]  Occupational Therapy Treatment  Patient Details  Name: William [name] Maranto MRN: [id number] Date of Birth: 1956/03/10 No Data Recorded  Encounter Date: 03/26/2016      OT End of Session - 03/27/16 1406    Visit Number 3   Number of Visits 4   Authorization Type medicaid visit 2 of 3 treatments.   OT Start Time 0935   OT Stop Time 1015   OT Time Calculation (min) 40 min   Activity Tolerance Patient tolerated treatment well   Behavior During Therapy WFL for tasks assessed/performed      Past Medical History  Diagnosis Date  . Hypertension   . Diabetes mellitus without complication (HCC)   . Chronic hepatitis C without hepatic coma (HCC)   . History of cocaine use   . Depression   . Arthritis   . History of TIA (transient ischemic attack)   . Hyperlipidemia   . History of migraine headaches   . Obesity, Class II, BMI 35-39.9   . BPH with obstruction/lower urinary tract symptoms   . Balanitis   . Urinary retention   . Tobacco abuse   . Abscess in epidural space of cervical spine 01/28/2016  . Retropharyngeal abscess 01/28/2016  . Hardware complicating wound infection (HCC) 01/28/2016    Past Surgical History  Procedure Laterality Date  . Rotator  cuff surgery       left shoulder   . Spider bite      on neck  . Anterior cervical decomp/discectomy fusion N/A 12/05/2015    Procedure: Anterior Cervical Decompression Fusion Cervical three-four, Cervical four-five, Cervical five-six, Cervical six-seven;  Surgeon: Julio SicksHenry Pool, MD;  Location: [facility];  Service: Neurosurgery;  Laterality: N/A;  . Anterior cervical decompression for epidural abscess N/A 12/20/2015    Procedure: ANTERIOR CERVICAL DECOMPRESSION FOR EPIDURAL ABSCESS;  Surgeon: Hilda LiasErnesto Botero, MD;  Location: [facility];  Service: Neurosurgery;  Laterality: N/A;  ANTERIOR  CERVICAL DECOMPRESSION FOR EPIDURAL ABSCESS    There were no vitals filed for this visit.      Subjective Assessment - 03/27/16 1357    Subjective  Patient reports he went to see the doctor again, asking about a harness for shoulders.  He is concerned about the limited therapy he can receive based on his insurance.    Pertinent History Patient is a 60 yo male with a diagnosis of cervical stenosis with myelopathy and underwent surgery for ACDF C3 -C7 on 12/05/15 and was discharged home on 12/07/2015, he subsequently had complications, neck edema and difficulty breathing and swallowing and was readmitted to the hospital on 12/20/2015 and had to have surgery for a large abcess, he went to IP rehab and was discharged on 01/04/2016 and now presents for outpatient OT.    Patient Stated Goals Wants to be independent, move his arms again, be able to care for himself.    Currently in Pain? No/denies   Pain Score 0-No pain                      OT Treatments/Exercises (OP) - 03/27/16 1359    ADLs   Grooming Patient instructed on long handled extended brush to assist with hair care.    UB Dressing Patient has difficulty with UB dressing skills.  Patient instructed on dressing techniques with emphasis on using the motions he has available.  He has to use table as a support for arms to get shirt over his head.  Instructed on use of dressing stick to assist with getting shirt overhead.  Further instruction to doff shirt with modified method.     Neurological Re-education Exercises   Other Exercises 1 Patient seen this date for focus on postural exercises with cues for head and neck alignment with back, cues for chin tuck at times. BUE ROM and strengthening exercises as follows: Patient seen in supine for gravity eliminated ROM for shoulder flexion, ABD, scaption, elbow flexion/extension with guiding from therapist and cues on each side, R/L. Patient engaging in dowel exercises for shoulder  flexion, chest press, ABD/ADD for 2 sets of 10 reps each with cues and facilitation of movement, difficulty moving from 0 to 90 degrees of shoulder flexion, once at 90 degrees, patient able to work towards overhead reach. HEP for ROM and strengthening.   Sensation Exercises   Stereognosis intact                OT Education - 03/27/16 1405    Education provided Yes   Education Details HEP, UE dressing techniques   Person(s) Educated Patient   Methods Explanation;Demonstration;Verbal cues   Comprehension Verbal cues required;Returned demonstration;Verbalized understanding             OT Long Term Goals - 02/28/16 1542    OT LONG TERM GOAL #1   Title Patient will improve active elbow ROM by 90 degrees to be able to feed self without difficulty.     Baseline has difficulty bringing hand to mouth, has to compensate by bringing head down to hand.   Time 8   Period Weeks   Status New   OT LONG TERM GOAL #2   Title Patient willl complete UB dressing with modified independence.   Baseline moderate assist at eval   Time 8   Period Weeks   Status New   OT LONG TERM GOAL #3   Title Patient will complete lower body dressing with modified independence.     Baseline min assist    Time 8   Period Weeks   Status New   OT LONG TERM GOAL #4   Title Patient will complete tub/shower transfer with modified independence.    Baseline min assist   Time 8   Period Weeks   Status New   OT LONG TERM GOAL #5   Title Patient will complete UB/LB bathing with modified independence.    Baseline moderate assist   Time 8   Period Weeks   Long Term Additional Goals   Additional Long Term Goals Yes   OT LONG TERM GOAL #6   Title Patient will complete light homemaking tasks with min assist   Baseline moderate to max assist    Time 8   Period Weeks   Status New               Plan - 03/27/16 1406    Clinical Impression Statement Patient has made some progress with available ROM  for functional tasks, still has some difficulty with UB dressing and was able to demo with adaptive equipment.  Will attempt to contact physician to inquire about harness he spoke to patient about during visit.  Patient would benefit from continued therapy services but has limited visits with insurance.    Rehab Potential Good   OT Frequency 1x / week   OT Duration 4 weeks   OT Treatment/Interventions Self-care/ADL training;Therapeutic exercise;Patient/family  education;Neuromuscular education;Manual Therapy;Therapeutic exercises;DME and/or AE instruction;Therapeutic activities;Moist Heat;Passive range of motion   Consulted and Agree with Plan of Care Patient      Patient will benefit from skilled therapeutic intervention in order to improve the following deficits and impairments:  Decreased endurance, Decreased activity tolerance, Decreased knowledge of use of DME, Decreased strength, Impaired flexibility, Decreased range of motion, Pain, Decreased coordination, Impaired UE functional use  Visit Diagnosis: Muscle weakness (generalized)  Other lack of coordination    Problem List Patient Active Problem List   Diagnosis Date Noted  . Abscess in epidural space of cervical spine 01/28/2016  . Retropharyngeal abscess 01/28/2016  . Hardware complicating wound infection (HCC) 01/28/2016  . Dysphagia 01/01/2016  . Cervical spondylosis with myelopathy 12/27/2015  . Uncontrolled type 2 diabetes mellitus with complication, with long-term current use of insulin (HCC)   . Chronic hepatitis C without hepatic coma (HCC)   . H/O cervical spine surgery   . Tachycardia   . Tachypnea   . Hypokalemia   . Encounter for nasogastric (NG) tube placement   . Neck abscess   . Brodie's abscess of cervical spine (HCC) 12/20/2015  . Abscess, neck   . Endotracheal tube present   . Post-operative wound abscess   . Respiratory failure with hypoxia (HCC)   . Cervical spinal stenosis 12/05/2015  . AA (alcohol  abuse) 10/11/2015  . Esophageal reflux   . Alcohol abuse   . DKA (diabetic ketoacidoses) (HCC) 08/09/2015  . Chest pain 08/09/2015  . Hyponatremia 08/09/2015  . Essential hypertension 08/09/2015  . Migraine with aura 08/09/2015  . Insomnia 08/09/2015  . Chronic pain 08/09/2015  . Diabetes mellitus (HCC) 08/09/2015  . Diabetic ketoacidosis without coma associated with type 2 diabetes mellitus (HCC)   . Weakness of left lower extremity   . Cervical radiculopathy at C5 08/03/2015  . Left upper extremity numbness 08/03/2015  . Right upper extremity numbness 08/03/2015  . Uncontrolled type 2 diabetes mellitus with background retinopathy (HCC) 07/19/2015  . Degenerative arthritis of thumb 07/10/2015  . Chronic radicular cervical pain 06/08/2015  . Degeneration of lumbar/lumbosacral disc without myelopathy 06/08/2015  . Degeneration of intervertebral disc of lumbosacral region 06/08/2015  . Metabolic syndrome 05/31/2015  . Knee pain 07/12/2014  . Balanitis 07/12/2014  . History of cocaine use 07/12/2014  . Depression 07/12/2014  . Chronic pain of multiple joints 07/12/2014  . Hx-TIA (transient ischemic attack) 07/12/2014  . Hx of migraine headaches 07/12/2014  . Obesity, Class I, BMI 30.0-34.9 (see actual BMI) 07/12/2014  . Cocaine abuse 07/12/2014  . Major depressive disorder with single episode (HCC) 07/12/2014  . Adiposity 07/12/2014  . Ache in joint 07/12/2014  . H/O disease 07/12/2014  . Personal history of transient ischemic attack (TIA), and cerebral infarction without residual deficits 07/12/2014  . Chronic hepatitis C (HCC) 07/11/2014  . Hyperlipidemia LDL goal <100 07/11/2014  . Chronic hepatitis C virus infection (HCC) 07/11/2014  . HLD (hyperlipidemia) 07/11/2014  . Uncontrolled type 2 diabetes mellitus with peripheral circulatory disorder (HCC) 12/02/2013  . Hypertension, goal below 140/90 12/02/2013  . Essential (primary) hypertension 12/02/2013  . Type 2 diabetes  mellitus with peripheral angiopathy (HCC) 12/02/2013   Ulysess Witz T Arne Cleveland, OTR/L, CLT  Burgandy Hackworth 03/28/2016, 2:09 PM  [facility] MAIN [facility] SERVICES [address] [place], [place], [address] Phone: [phone]   Fax:  [phone]  Name: Masud Holub MRN: [id number] Date of Birth: Jan 29, 1956

## 2016-04-01 MED FILL — ZEPATIER 50-100 MG TABLET: 50-100 | 28 days supply | Qty: 28 | Fill #2

## 2016-04-02 ENCOUNTER — Other Ambulatory Visit: Payer: Self-pay | Admitting: Family Medicine

## 2016-04-02 ENCOUNTER — Encounter: Payer: Medicaid Other | Admitting: Occupational Therapy

## 2016-04-02 DIAGNOSIS — K449 Diaphragmatic hernia without obstruction or gangrene: Secondary | ICD-10-CM

## 2016-04-02 NOTE — Telephone Encounter (Signed)
Pt was prescribed sucralfate tablets and that's not helping.  He would like to go back to the carafate solution.  His call back number is [phone]

## 2016-04-02 NOTE — Telephone Encounter (Signed)
Pharmacist called to confirm change. Patient requested he had one more refill left.[place]

## 2016-04-03 ENCOUNTER — Telehealth: Payer: Self-pay

## 2016-04-03 MED ORDER — SUCRALFATE 1 GM/10ML PO SUSP: 1.0000 g | Freq: Four times a day (QID) | ORAL | Status: AC

## 2016-04-03 NOTE — Telephone Encounter (Signed)
Can you please schedule him with ET next week?

## 2016-04-03 NOTE — Addendum Note (Signed)
Addended by: Alease FrameARTER, Jakhi Dishman S on: 04/03/2016 09:22 AM   Modules accepted: Orders

## 2016-04-03 NOTE — Telephone Encounter (Signed)
Have him see eunice

## 2016-04-03 NOTE — Telephone Encounter (Signed)
Pt states that he will need a refill on his oxycodone next week. His next appointment is not until August and a second rx was not given this month. Please advise on refill.

## 2016-04-03 NOTE — Telephone Encounter (Signed)
Change back to Carafate suspension, 1 dose (1 tsp) 4 times a day.  Fill 600 ccs with 6 refills.-jh

## 2016-04-03 NOTE — Telephone Encounter (Signed)
Patient is scheduled with Jacalyn LefevreEunice Thomas on 04/03/16 at 9:30.

## 2016-04-04 ENCOUNTER — Encounter: Payer: Self-pay | Admitting: Registered Nurse

## 2016-04-04 ENCOUNTER — Encounter (HOSPITAL_BASED_OUTPATIENT_CLINIC_OR_DEPARTMENT_OTHER): Payer: Medicaid Other | Admitting: Registered Nurse

## 2016-04-04 VITALS — BP 163/111 | HR 99 | Resp 14

## 2016-04-04 DIAGNOSIS — E1151 Type 2 diabetes mellitus with diabetic peripheral angiopathy without gangrene: Secondary | ICD-10-CM

## 2016-04-04 DIAGNOSIS — M4712 Other spondylosis with myelopathy, cervical region: Secondary | ICD-10-CM | POA: Diagnosis not present

## 2016-04-04 DIAGNOSIS — M5412 Radiculopathy, cervical region: Secondary | ICD-10-CM | POA: Diagnosis not present

## 2016-04-04 DIAGNOSIS — M50022 Cervical disc disorder at C5-C6 level with myelopathy: Secondary | ICD-10-CM | POA: Diagnosis not present

## 2016-04-04 DIAGNOSIS — I10 Essential (primary) hypertension: Secondary | ICD-10-CM

## 2016-04-04 DIAGNOSIS — G894 Chronic pain syndrome: Secondary | ICD-10-CM

## 2016-04-04 DIAGNOSIS — E1165 Type 2 diabetes mellitus with hyperglycemia: Secondary | ICD-10-CM

## 2016-04-04 DIAGNOSIS — IMO0002 Reserved for concepts with insufficient information to code with codable children: Secondary | ICD-10-CM

## 2016-04-04 DIAGNOSIS — Z5181 Encounter for therapeutic drug level monitoring: Secondary | ICD-10-CM

## 2016-04-04 DIAGNOSIS — Z79899 Other long term (current) drug therapy: Secondary | ICD-10-CM

## 2016-04-04 MED ORDER — OXYCODONE HCL 5 MG PO TABS: 5.0000 mg | ORAL_TABLET | Freq: Four times a day (QID) | ORAL | Status: AC | PRN

## 2016-04-04 NOTE — Progress Notes (Signed)
Subjective:    Patient ID: William [name], male    DOB: 04/03/1956, 60 y.o.   MRN: [id number]  HPI: William [name] is a 60 year old male who returns for follow up appointment and medication refill. He states his pain is located in his bilateral shoulders and right arm. He rates his pain 3. His current exercise regime is attending physical therapy weekly and walking. He arrived hypertensive blood pressure rechecked . William [name] has obtained a new PCP he had his visit on 03/18/2016 he was prescribed losartan he state's he will be picking up medication today. He wasn't able to pick it up prior due to financial hardship he states. He refuses ED evaluation. He will follow up with his PCP. Educated on medication compliance, we called pharmacy his medication is ready, he verbalizes understanding.  Pain Inventory Average Pain 7 Pain Right Now 3 My pain is sharp, stabbing and aching  In the last 24 hours, has pain interfered with the following? General activity 7 Relation with others 4 Enjoyment of life 6 What TIME of day is your pain at its worst? morning, daytime, evening Sleep (in general) Fair  Pain is worse with: walking, bending and standing Pain improves with: medication Relief from Meds: 7  Mobility walk without assistance how many minutes can you walk? 5-10 ability to climb steps?  no do you drive?  no Do you have any goals in this area?  yes  Function disabled: date disabled . I need assistance with the following:  dressing, bathing, meal prep, household duties and shopping  Neuro/Psych tingling  Prior Studies Any changes since last visit?  no  Physicians involved in your care Any changes since last visit?  no   Family History  Problem Relation Age of Onset  . Hypertension Mother   . Diabetes Mother   . Heart disease Mother   . Thyroid disease Mother   . Hypertension Father   . Diabetes Father   . Heart disease Father    Social History   Social History  .  Marital Status: Divorced    Spouse Name: N/A  . Number of Children: N/A  . Years of Education: N/A   Social History Main Topics  . Smoking status: Current Some Day Smoker -- 0.10 packs/day for 30 years    Types: Cigarettes    Last Attempt to Quit: 12/09/2015  . Smokeless tobacco: Current User  . Alcohol Use: No  . Drug Use: No     Comment: history of drug use, admitted 04/14/13 for cocaine use   . Sexual Activity:    Partners: Female    Pharmacist, hospitalBirth Control/ Protection: Condom   Other Topics Concern  . Not on file   Social History Narrative   Past Surgical History  Procedure Laterality Date  . Rotator  cuff surgery       left shoulder   . Spider bite      on neck  . Anterior cervical decomp/discectomy fusion N/A 12/05/2015    Procedure: Anterior Cervical Decompression Fusion Cervical three-four, Cervical four-five, Cervical five-six, Cervical six-seven;  Surgeon: Julio SicksHenry Pool, MD;  Location: [facility];  Service: Neurosurgery;  Laterality: N/A;  . Anterior cervical decompression for epidural abscess N/A 12/20/2015    Procedure: ANTERIOR CERVICAL DECOMPRESSION FOR EPIDURAL ABSCESS;  Surgeon: Hilda LiasErnesto Botero, MD;  Location: [facility];  Service: Neurosurgery;  Laterality: N/A;  ANTERIOR CERVICAL DECOMPRESSION FOR EPIDURAL ABSCESS   Past Medical History  Diagnosis Date  . Hypertension   .  Diabetes mellitus without complication (HCC)   . Chronic hepatitis C without hepatic coma (HCC)   . History of cocaine use   . Depression   . Arthritis   . History of TIA (transient ischemic attack)   . Hyperlipidemia   . History of migraine headaches   . Obesity, Class II, BMI 35-39.9   . BPH with obstruction/lower urinary tract symptoms   . Balanitis   . Urinary retention   . Tobacco abuse   . Abscess in epidural space of cervical spine 01/28/2016  . Retropharyngeal abscess 01/28/2016  . Hardware complicating wound infection (HCC) 01/28/2016   There were no vitals taken for this  visit.  Opioid Risk Score:   Fall Risk Score:  `1  Depression screen PHQ 2/9  [facility] 03/13/2016 03/10/2016 02/12/2016 08/03/2015 05/30/2015  Decreased Interest 1 0 0 0 0  Down, Depressed, Hopeless 0 0 0 0 0  PHQ - 2 Score 1 0 0 0 0     Review of Systems  Constitutional: Negative.   HENT: Negative.   Eyes: Negative.   Respiratory: Negative.   Cardiovascular: Negative.   Gastrointestinal: Negative.   Endocrine:       High Blood Sugar  Genitourinary: Negative.   Musculoskeletal: Positive for arthralgias.  Skin: Negative.   Allergic/Immunologic: Negative.   Neurological:       Tingling  Hematological: Negative.   Psychiatric/Behavioral: Negative.   All other systems reviewed and are negative.      Objective:   Physical Exam  Constitutional: He is oriented to person, place, and time. He appears well-developed.  HENT:  Head: Normocephalic and atraumatic.  Neck: Normal range of motion. Neck supple.  Musculoskeletal:  Normal Muscle Bulk and Muscle Testing Reveals: Upper Extremities: Decreased ROM 30 Degrees and Muscle Strength 4/5 Lower Extremities: Full ROM and Muscle Strength 5/5 Left Lower Extremity Flexion Produces Pain into Patella Arises from table with ease Narrow Based Gait  Neurological: He is alert and oriented to person, place, and time.  Skin: Skin is warm and dry.  Psychiatric: He has a normal mood and affect.  Nursing note and vitals reviewed.         Assessment & Plan:  1. Cervical myelopathy and bilateral C5 radiculopathy secondary to Cervical spinal stenosis: Continue with Physical Therapy. Continue Gabapentin 2. Pain Management:Refilled: Oxycodone 5 mg one tablet every 6 hours as needed for severe pain #100. Continue  Gabapentin.  3.Uncontrolled HTN: Refuses ED evaluation. Will be picking up his Losartan Today. PCP Following.  30 minutes of face to face patient care time was spent during this visit. All questions were encouraged  and answered.  F/U in 1 month

## 2016-04-06 ENCOUNTER — Emergency Department (HOSPITAL_COMMUNITY): Payer: Medicaid Other

## 2016-04-06 ENCOUNTER — Emergency Department (HOSPITAL_COMMUNITY)
Admission: EM | Admit: 2016-04-06 | Discharge: 2016-04-06 | Disposition: A | Discharge status: Home / Self Care | Payer: Medicaid Other | Attending: Emergency Medicine | Admitting: Emergency Medicine

## 2016-04-06 DIAGNOSIS — Z79899 Other long term (current) drug therapy: Secondary | ICD-10-CM | POA: Diagnosis not present

## 2016-04-06 DIAGNOSIS — Z8673 Personal history of transient ischemic attack (TIA), and cerebral infarction without residual deficits: Secondary | ICD-10-CM | POA: Diagnosis not present

## 2016-04-06 DIAGNOSIS — R202 Paresthesia of skin: Secondary | ICD-10-CM

## 2016-04-06 DIAGNOSIS — R0602 Shortness of breath: Secondary | ICD-10-CM | POA: Insufficient documentation

## 2016-04-06 DIAGNOSIS — E119 Type 2 diabetes mellitus without complications: Secondary | ICD-10-CM | POA: Diagnosis not present

## 2016-04-06 DIAGNOSIS — R200 Anesthesia of skin: Secondary | ICD-10-CM

## 2016-04-06 DIAGNOSIS — F17210 Nicotine dependence, cigarettes, uncomplicated: Secondary | ICD-10-CM | POA: Insufficient documentation

## 2016-04-06 DIAGNOSIS — F329 Major depressive disorder, single episode, unspecified: Secondary | ICD-10-CM | POA: Diagnosis not present

## 2016-04-06 DIAGNOSIS — Z794 Long term (current) use of insulin: Secondary | ICD-10-CM | POA: Diagnosis not present

## 2016-04-06 DIAGNOSIS — I10 Essential (primary) hypertension: Secondary | ICD-10-CM | POA: Diagnosis not present

## 2016-04-06 LAB — BASIC METABOLIC PANEL
ANION GAP: 10 (ref 5–15)
BUN: 10 mg/dL (ref 6–20)
CHLORIDE: 99 mmol/L — AB (ref 101–111)
CO2: 23 mmol/L (ref 22–32)
Calcium: 9.6 mg/dL (ref 8.9–10.3)
Creatinine, Ser: 1.28 mg/dL — ABNORMAL HIGH (ref 0.61–1.24)
GFR calc Af Amer: >60 mL/min (ref >60)
GFR, EST NON AFRICAN AMERICAN: 59 mL/min — AB (ref >60)
GLUCOSE: 411 mg/dL — AB (ref 65–99)
POTASSIUM: 3.7 mmol/L (ref 3.5–5.1)
Sodium: 132 mmol/L — ABNORMAL LOW (ref 135–145)

## 2016-04-06 LAB — CBC
HEMATOCRIT: 42.9 % (ref 39.0–52.0)
HEMOGLOBIN: 14.2 g/dL (ref 13.0–17.0)
MCH: 28.6 pg (ref 26.0–34.0)
MCHC: 33.1 g/dL (ref 30.0–36.0)
MCV: 86.5 fL (ref 78.0–100.0)
PLATELETS: 215 10*3/uL (ref 150–400)
RBC: 4.96 MIL/uL (ref 4.22–5.81)
RDW: 14.4 % (ref 11.5–15.5)
WBC: 6.7 10*3/uL (ref 4.0–10.5)

## 2016-04-06 MED ORDER — GADOBENATE DIMEGLUMINE 529 MG/ML IV SOLN
20.0000 mL | Freq: Once | INTRAVENOUS | Status: AC | PRN
  Administered 2016-04-06: 20 mL via INTRAVENOUS

## 2016-04-06 NOTE — ED Provider Notes (Signed)
Patient reports he feels as if his areas being cut off when he flexes his neck onset yesterday. He denies pain anywhere he is asymptomatic while keeping his neck extended or neutral position. He is concerned that he may have a reaccumulation of an abscess. He denies any shortness of breath dyspnea is not exertional. Denies pain anywhere denies fever. No other associated symptoms on exam alert no distress lungs clear HEENT exam no facial asymmetry oral pharynx is normal neck supple no bruit. No tenderness overlying laryngeal cartilage or elsewhere on neck lungs clear auscultation heart regular rate and rhythm abdomen nondistended nontender extremities without edema  Doug SouSam Elliana Bal, MD 04/06/16 1706

## 2016-04-06 NOTE — Care Management Note (Signed)
Case Management Note  Patient Details  Name: William [name] MRN: [id number] Date of Birth: [birth year]  Subjective/Objective: 60 yo [name] seen in the ED for continued inability to use BUEs s/p Anterior Cervical Discectomy/Decompression with fusion 12/05/2015. Pt has had subsequent readmission for abscess since that time and is now in the recovery period. Comes to the Emergency Dept today seeking assistance in his home as he reports he and fiancee, who is at his bedside are about to part company and he is concerned that he will be unable to care for himself alone in his home. He tells me he has no friends of family here in [place], as they all live in [place] where I encouraged him to reach out for assistance. Pt is [facility] where he  has his final session this week. We talked about [employer] usual approval of 2-3 sessions. He understands the recovery process is a long one and that he needs to continue with his exercises so that he can get stronger even when he is not actively in Therapy. Pt appreciative of education.                    Action/Plan: CM will sign off for now.    Expected Discharge Date:                  Expected Discharge Plan:     In-House Referral:     Discharge planning Services  CM Consult  Post Acute Care Choice:    Choice offered to:  Patient  DME Arranged:    DME Agency:     HH Arranged:    HH Agency:     Status of Service:  Completed, signed off  If discussed at [employer] Length of Stay Meetings, dates discussed:    Additional Comments:  William [name], William Arreaga M, RN 04/06/2016, 3:34 PM

## 2016-04-06 NOTE — ED Notes (Addendum)
Pt reports SOBwith neck flexion.  Pt reports hx of same as complication of spine surgery in March.  Pt denies new or different back pain. Pt denies fever, chills, CP. Pt reports starting Cozaar on Friday.  Resp e/u with head in neutral position.

## 2016-04-06 NOTE — Care Management Note (Signed)
Case Management Note  Patient Details  Name: William [name] MRN: [id number] Date of Birth: 04-Feb-1956  Subjective/Objective:                    Action/Plan: pt has Medicaid and is not eligible for HHPT. Has already Had HHOT this year following his Anterior Cervical Discectomy/Fusion 12/2015.  No further CM needs at this time.    Expected Discharge Date:                  Expected Discharge Plan:     In-House Referral:     Discharge planning Services     Post Acute Care Choice:    Choice offered to:     DME Arranged:    DME Agency:     HH Arranged:    HH Agency:     Status of Service:     If discussed at [employer] Length of [employer]Stay Meetings, dates discussed:    Additional Comments:  Yvone NeuCrutchfield, William Titsworth M, RN 04/06/2016, 3:02 PM

## 2016-04-06 NOTE — Discharge Instructions (Signed)
Mr. Jill SideWarren Yano,  Nice meeting you! Please follow-up with your primary care provider and surgeon as needed. Return to the emergency department if you develop increased shortness of breath, especially when exerting yourself, pain, new/worsening symptoms. Feel better soon!  S. Lane HackerNicole Elya Diloreto, PA-C

## 2016-04-06 NOTE — ED Notes (Signed)
Beverely LowJeannie, RN case Production designer, theatre/television/filmmanager, at bedside.

## 2016-04-06 NOTE — ED Provider Notes (Signed)
CSN: [id number]     Arrival date & time 04/06/16  [id number] History   First MD Initiated Contact with Patient 04/06/16 1004     Chief Complaint  Patient presents with  . Shortness of Breath   HPI   William [name] is a 60 y.o. male PMH significant for HTN, chronic hepatitis C, cocaine use (denies using currently and UDS 3 months ago was negative for cocaine), depression, TIA, HLD, epidural abscess, retropharyngeal abscess presenting with a 1 day history of shortness of breath with neck flexion. He describes his shortness of breath as exactly like his shortness of breath in March when he had a neck abscess, and is concerned the abscess has returned. His symptoms resolve when he straightens his neck. He denies any exertional component, fevers, chillls, headaches, cough, chest pain, arm tingling/numbness, nausea/vomiting, difficulty swallowing, pain anywhere.  Past Medical History  Diagnosis Date  . Hypertension   . Diabetes mellitus without complication (HCC)   . Chronic hepatitis C without hepatic coma (HCC)   . History of cocaine use   . Depression   . Arthritis   . History of TIA (transient ischemic attack)   . Hyperlipidemia   . History of migraine headaches   . Obesity, Class II, BMI 35-39.9   . BPH with obstruction/lower urinary tract symptoms   . Balanitis   . Urinary retention   . Tobacco abuse   . Abscess in epidural space of cervical spine 01/28/2016  . Retropharyngeal abscess 01/28/2016  . Hardware complicating wound infection (HCC) 01/28/2016   Past Surgical History  Procedure Laterality Date  . Rotator  cuff surgery       left shoulder   . Spider bite      on neck  . Anterior cervical decomp/discectomy fusion N/A 12/05/2015    Procedure: Anterior Cervical Decompression Fusion Cervical three-four, Cervical four-five, Cervical five-six, Cervical six-seven;  Surgeon: Julio SicksHenry Pool, MD;  Location: [facility];  Service: Neurosurgery;  Laterality: N/A;  . Anterior cervical decompression  for epidural abscess N/A 12/20/2015    Procedure: ANTERIOR CERVICAL DECOMPRESSION FOR EPIDURAL ABSCESS;  Surgeon: Hilda LiasErnesto Botero, MD;  Location: [facility];  Service: Neurosurgery;  Laterality: N/A;  ANTERIOR CERVICAL DECOMPRESSION FOR EPIDURAL ABSCESS   Family History  Problem Relation Age of Onset  . Hypertension Mother   . Diabetes Mother   . Heart disease Mother   . Thyroid disease Mother   . Hypertension Father   . Diabetes Father   . Heart disease Father    Social History  Substance Use Topics  . Smoking status: Current Some Day Smoker -- 0.10 packs/day for 30 years    Types: Cigarettes    Last Attempt to Quit: 12/09/2015  . Smokeless tobacco: Current User  . Alcohol Use: No    Review of Systems  Ten systems are reviewed and are negative for acute change except as noted in the HPI  Allergies  Metformin and related; Prednisone; Tamsulosin; Other; and Lisinopril  Home Medications   Prior to Admission medications   Medication Sig Start Date End Date Taking? Authorizing Provider  BD PEN NEEDLE NANO U/F 32G X 4 MM MISC USE WITH FLEXPEN TO ADMINISTER INSULIN 08/14/15   Edwena FeltyAshany Sundaram, MD  cyclobenzaprine (FLEXERIL) 10 MG tablet TAKE 1 TABLET (10 MG TOTAL) BY MOUTH 3 (THREE) TIMES DAILY AS NEEDED FOR MUSCLE SPASMS. 01/11/16   Historical Provider, MD  Elbasvir-Grazoprevir (ZEPATIER) 50-100 MG TABS Take 1 tablet by mouth daily. 02/05/16   Lisette Grinderornelius N  Daiva Eves, MD  fluconazole (DIFLUCAN) 150 MG tablet Take 1 tablet (150 mg total) by mouth once. 03/13/16   Amy Rusty Aus, NP  gabapentin (NEURONTIN) 300 MG capsule Take 2 capsules (600 mg total) by mouth 3 (three) times daily. 03/12/16   Ranelle Oyster, MD  glucose blood (ACCU-CHEK AVIVA) test strip Use as instructed to check blood glucose 4x/day 05/30/15   Edwena Felty, MD  insulin aspart (NOVOLOG FLEXPEN) 100 UNIT/ML FlexPen Inject 15 Units into the skin 3 (three) times daily with meals. Patient taking differently: Inject 25 Units  into the skin 3 (three) times daily with meals.  08/11/15   Vassie Loll, MD  insulin glargine (LANTUS) 100 UNIT/ML injection Inject 0.35 mLs (35 Units total) into the skin at bedtime. Patient taking differently: Inject 35 Units into the skin at bedtime. As per pt takes 50 units before bed. 01/04/16   Mcarthur Rossetti Angiulli, PA-C  Insulin Pen Needle (EASY TOUCH PEN NEEDLES) 32G X 6 MM MISC Use with Flexpen to administer Insulin 08/04/14   Radhika P Phadke, MD  Lancets (FREESTYLE) lancets Use as instructed to check blood glucose 4x/day 05/30/15   Edwena Felty, MD  LANTUS SOLOSTAR 100 UNIT/ML Solostar Pen INJECT 30 UNITS INTO THE SKIN 2 (TWO) TIMES DAILY. 02/06/16   Historical Provider, MD  losartan (COZAAR) 25 MG tablet Take 1 tablet (25 mg total) by mouth daily. 03/18/16   Amy Rusty Aus, NP  meloxicam (MOBIC) 7.5 MG tablet Take 1 tablet (7.5 mg total) by mouth daily. 03/13/16 03/13/17  Amy Rusty Aus, NP  oxyCODONE (OXY IR/ROXICODONE) 5 MG immediate release tablet Take 1 tablet (5 mg total) by mouth every 6 (six) hours as needed for severe pain. 04/04/16   Jones Bales, NP  pantoprazole (PROTONIX) 40 MG tablet Take 1 tablet (40 mg total) by mouth 2 (two) times daily. Twice a day for 10 days; then start using it once a day 01/04/16   Mcarthur Rossetti Angiulli, PA-C  sucralfate (CARAFATE) 1 GM/10ML suspension Take 10 mLs (1 g total) by mouth 4 (four) times daily. 04/03/16   Janeann Forehand., MD  sulfamethoxazole-trimethoprim (BACTRIM DS,SEPTRA DS) 800-160 MG tablet Take 1 tablet by mouth 2 (two) times daily. 01/28/16   Randall Hiss, MD   BP 114/90 mmHg  Pulse 114  Resp 13  Ht  (1.778 m)  Wt 104.327 kg  BMI 33.00 kg/m2  SpO2 98% Physical Exam  Constitutional: He is oriented to person, place, and time. He appears well-developed and well-nourished. No distress.  HENT:  Head: Normocephalic and atraumatic.  Right Ear: External ear normal.  Left Ear: External ear normal.  Nose: Nose normal.   Mouth/Throat: Oropharynx is clear and moist. No oropharyngeal exudate.  Tonsils 3+ bilaterally without erythema, exudate. No uvular deviation or edema. Airway patent  Eyes: Conjunctivae and EOM are normal. Right eye exhibits no discharge. Left eye exhibits no discharge.  Neck: Normal range of motion. Neck supple. No tracheal deviation present.  Cardiovascular: Normal rate, regular rhythm, normal heart sounds and intact distal pulses.  Exam reveals no gallop and no friction rub.   No murmur heard. Pulmonary/Chest: Effort normal and breath sounds normal. No stridor. No respiratory distress. He has no wheezes. He has no rales. He exhibits no tenderness.  Abdominal: Soft. Bowel sounds are normal. He exhibits no distension and no mass. There is no tenderness. There is no rebound and no guarding.  Musculoskeletal: Normal range of motion. He exhibits no  edema.  No midline cervical, thoracic, lumbar tenderness.   Lymphadenopathy:    He has no cervical adenopathy.  Neurological: He is alert and oriented to person, place, and time. Coordination normal.  Skin: Skin is warm and dry. No rash noted. He is not diaphoretic. No erythema.  Psychiatric: He has a normal mood and affect. His behavior is normal.  Nursing note and vitals reviewed.   ED Course  Procedures  Labs Review Labs Reviewed  BASIC METABOLIC PANEL - Abnormal; Notable for the following:    Sodium 132 (*)    Chloride 99 (*)    Glucose, Bld 411 (*)    Creatinine, Ser 1.28 (*)    GFR calc non Af Amer 59 (*)    All other components within normal limits  CBC    Imaging Review Mr Cervical Spine W Wo Contrast  04/06/2016  CLINICAL DATA:  New onset of shortness of breath with neck flexion. The patient had the same symptoms with a prevertebral abscess after cervical spine surgery in March 2017. EXAM: MRI CERVICAL SPINE WITHOUT AND WITH CONTRAST TECHNIQUE: Multiplanar and multiecho pulse sequences of the cervical spine, to include the  craniocervical junction and cervicothoracic junction, were obtained according to standard protocol without and with intravenous contrast. CONTRAST:  20mL MULTIHANCE GADOBENATE DIMEGLUMINE 529 MG/ML IV SOLN COMPARISON:  CT scans dated 12/20/2015 and 12/09/2015 FINDINGS: There is no current prevertebral abscess or other acute abnormality. The previously noted abscess in the prevertebral soft tissues in the right side of the neck has completely resolved. The tonsils with slightly prominent. Epiglottis is normal. Tongue base is normal. The patient has undergone anterior fusion from C3 through C7. There is no residual impingement upon the cervical spinal cord. There is persistent myelopathy in the cervical spinal cord at C4. There is a small broad-based disc bulge at C7-T1 without neural impingement. Moderate right and slight left facet arthritis at C7-T1. T1-2 through T3-4 are normal. Craniocervical junction through C2-3 are normal except for moderately severe left facet arthritis at C2-3, unchanged. Slight left foraminal stenosis at C2-3 After contrast administration there is no pathologic enhancement in the cervical spine or in the prevertebral soft tissues. IMPRESSION: 1. No evidence of recurrent prevertebral abscess. Complete resolution of the previous neck abscess. 2. Slight prominence of the tonsils. The airway appears widely patent. 3. Satisfactory appearance of the cervical spine after anterior fusion from C3-C7. 4. Persistent myelopathy of the cervical spinal cord at C4. Electronically Signed   By: Francene BoyersJames  Maxwell M.D.   On: 04/06/2016 14:42   I have personally reviewed and evaluated these images and lab results as part of my medical decision-making.  MDM   Final diagnoses:  Shortness of breath   Patient nontoxic appearing, VSS. Per prior record review, patient has persistent tachycardia.  MR cervical spine demonstrates no evidence of recurrent prevertebral abscess and complete resolution of the  previous neck abscess. There is slight prominence of the tonsils but the airway appears widely patent. There is satisfactory appearance of the cervical spine after anterior fusion from C3-C7. As well as persistent myelopathy of the cervical spinal cord at C4. CBC without leukocytosis and is unremarkable. BMP with elevated SCr from baseline of 1.0 to 1.28. Encouraged patient to recheck SCr through PCP and to hydrate.  Presentation non concerning for PTA, infxn spread to soft tissue, ACS, PE, CVA, PNA. No trismus or uvular deviation.   Care management consulted per patient request; please see their note for assessment. Patient may be  safely discharged home. Discussed reasons for return. Patient to follow-up with primary care provider within one week and his surgeon as needed. Patient in understanding and agreement with the plan.  Melton Krebs, PA-C 04/12/16 1146  Doug Sou, MD 04/12/16 [id number]

## 2016-04-06 NOTE — ED Notes (Signed)
Patient transported to MRI 

## 2016-04-10 ENCOUNTER — Ambulatory Visit: Payer: Medicaid Other | Admitting: Family Medicine

## 2016-04-24 ENCOUNTER — Telehealth: Payer: Self-pay | Admitting: Family Medicine

## 2016-04-24 NOTE — Telephone Encounter (Signed)
Please call April at [facility] [employer] about pt's death certificate [phone]

## 2016-04-24 NOTE — Telephone Encounter (Signed)
Called and spoke to April. ME is stating MD must sign. However- we have no information on his death and do not know cause.  He was not referred to ME office for autopsy because he was under the care of a primary doctor. Per April- best recourse of action to is speak with chief ME off at [phone] ME office advised since it was declined by ME taht it would be safe to list "Hypertensive cardiovascular disease as cause 2/2 history. Listed hep C and diabetes as contributory.  Will fax to omega.

## 2016-04-29 ENCOUNTER — Ambulatory Visit: Payer: Medicaid Other | Admitting: Registered Nurse

## 2016-05-06 DEATH — deceased

## 2016-05-08 ENCOUNTER — Ambulatory Visit: Payer: Medicaid Other | Admitting: Registered Nurse

## 2016-05-13 ENCOUNTER — Ambulatory Visit: Payer: Medicaid Other | Admitting: Registered Nurse

## 2016-05-27 ENCOUNTER — Other Ambulatory Visit: Payer: Medicaid Other

## 2016-06-10 ENCOUNTER — Ambulatory Visit: Payer: Medicaid Other | Admitting: Infectious Disease

## 2017-02-25 IMAGING — CR DG ABDOMEN 1V
2 series · 2 of 2 positions shown · non-contrast
Comparison: December 20, 2015

CLINICAL DATA: Nausea and vomiting

EXAM:
ABDOMEN - 1 VIEW

[AP (1 of 2)]
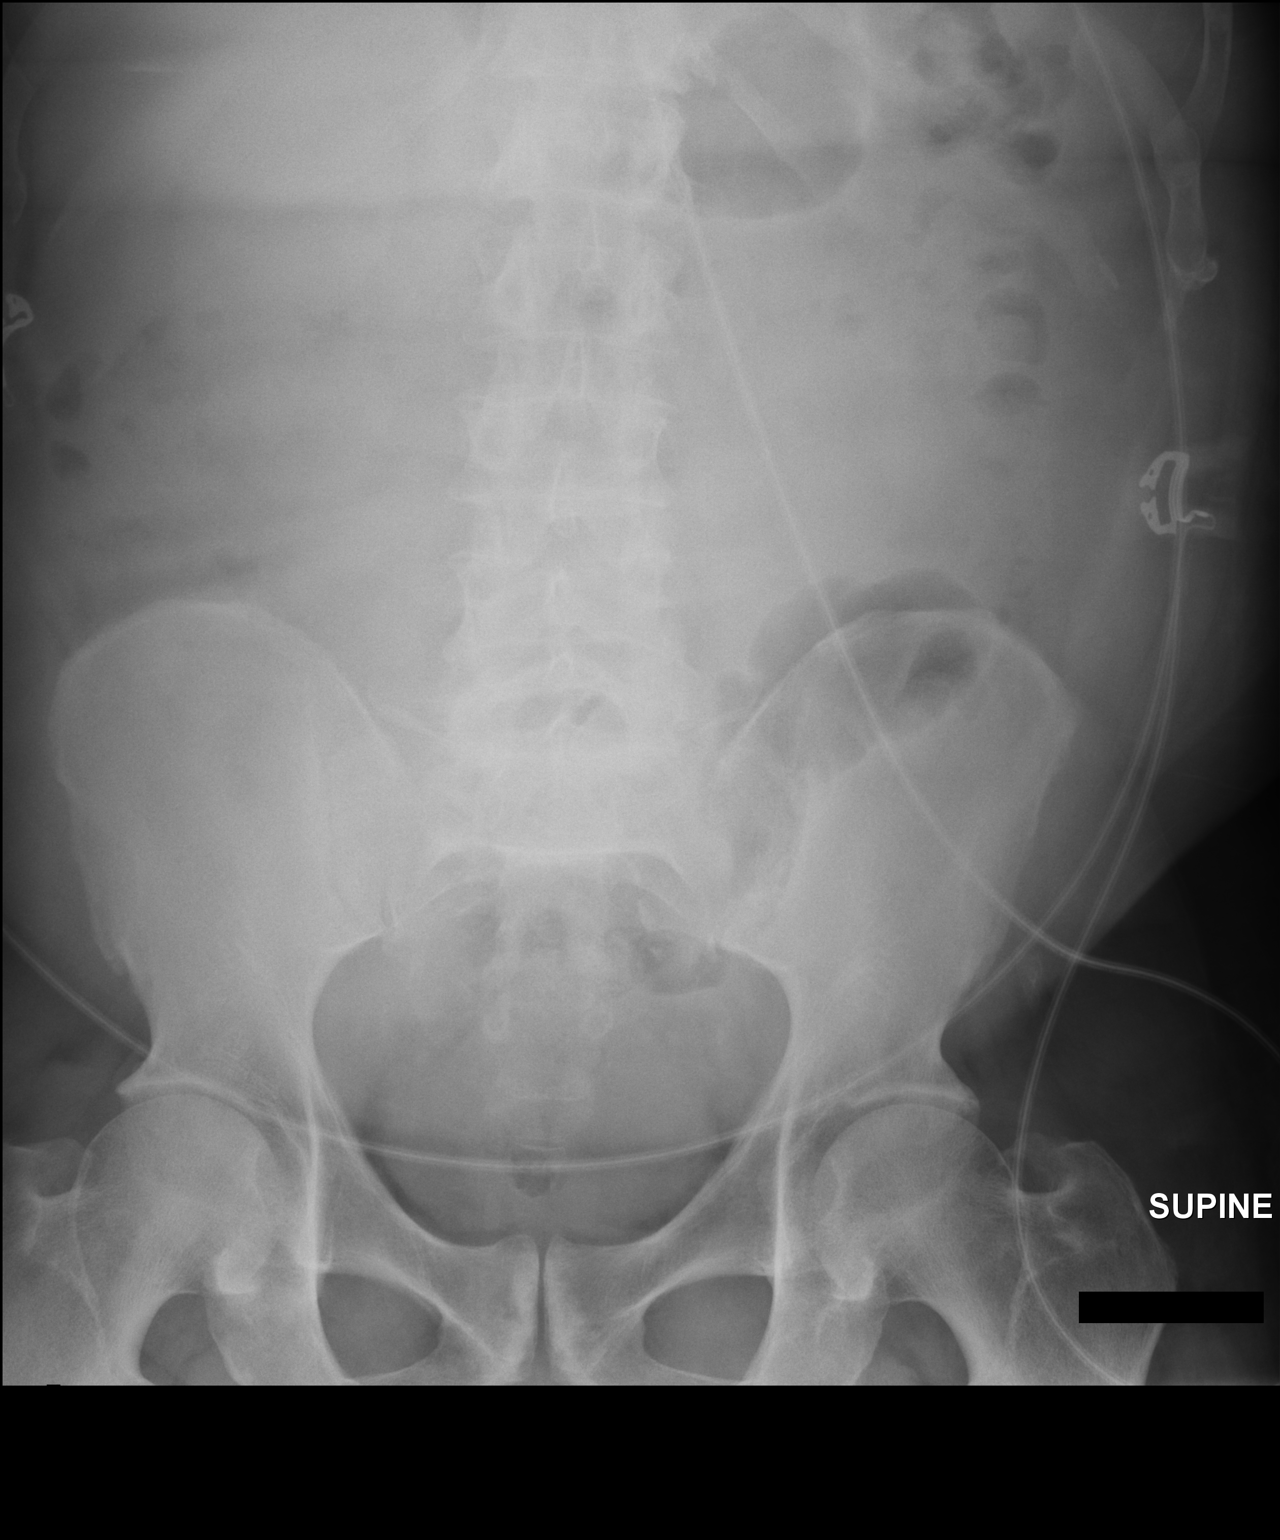

[AP (2 of 2)]
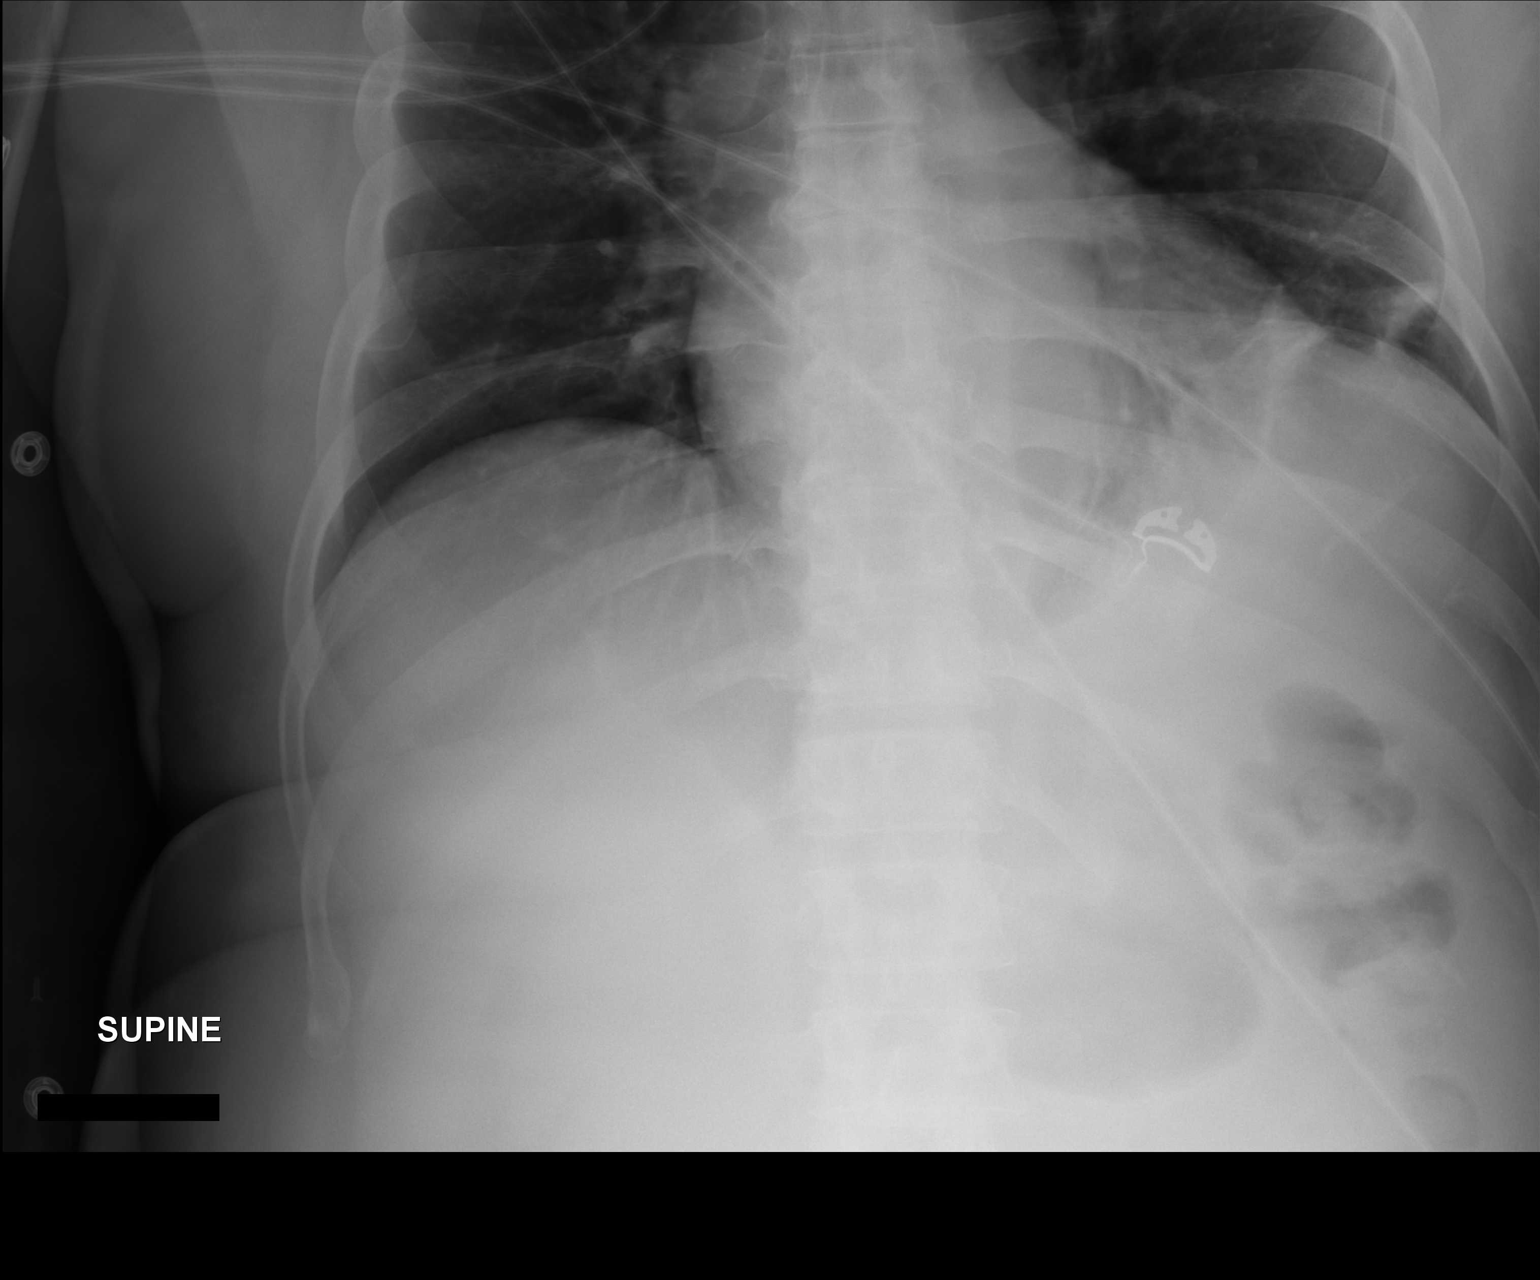

[2 of 2 positions shown; findings below may reference images not displayed]

FINDINGS: There is moderate stool throughout the colon. There is no bowel
dilatation or air-fluid level suggesting obstruction. No free air.
There is atelectatic change in the left lung base region. Early
pneumonia in this area cannot be excluded.
IMPRESSION: Bowel gas pattern unremarkable without obstruction or free air
evident. Atelectasis left base with questionable early pneumonia in
this area.

## 2017-02-27 IMAGING — RF DG SWALLOWING FUNCTION - NRPT [facility]
1 series · 18 of 24 positions shown · non-contrast
Comparison: none

[Series 1: run · 10 acquisitions, 18 frames shown]
[im 1/10]
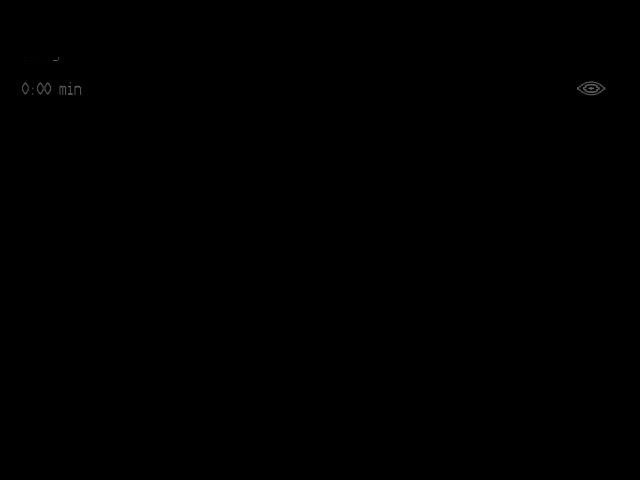
[im 1/10]
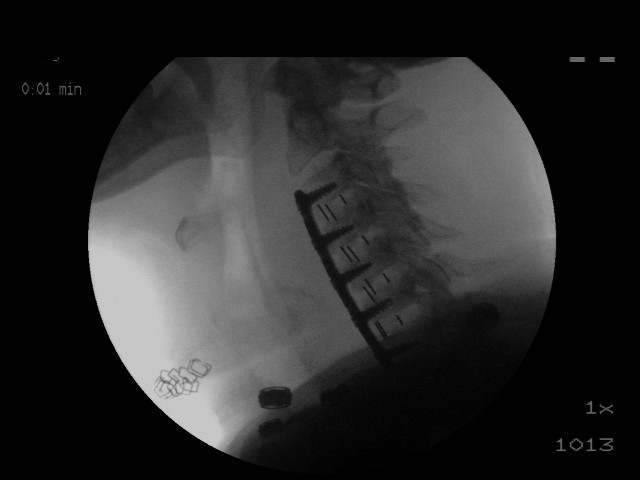
[im 2/10]
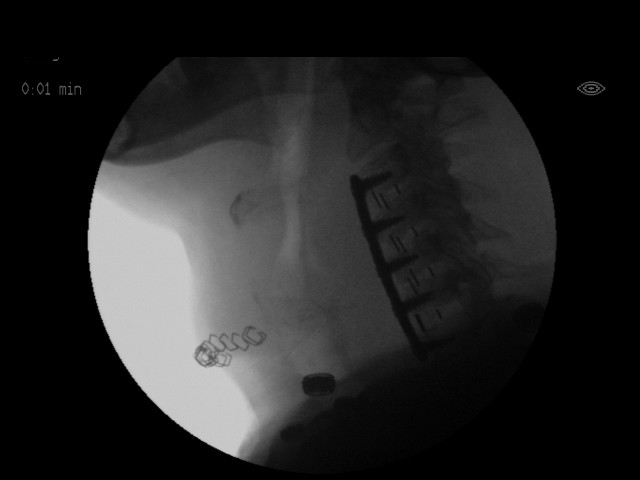
[im 2/10]
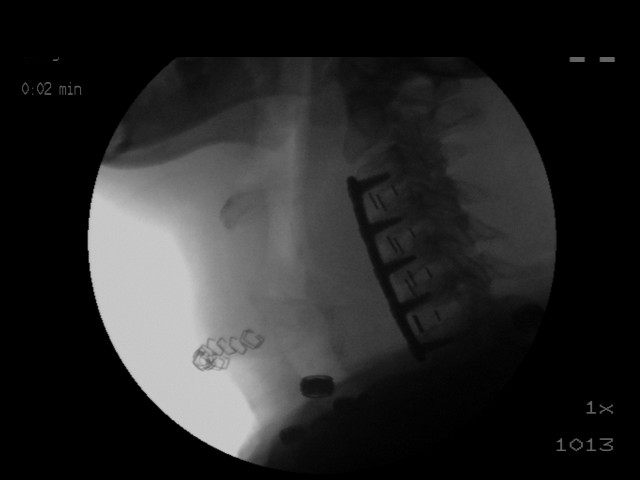
[im 3/10]
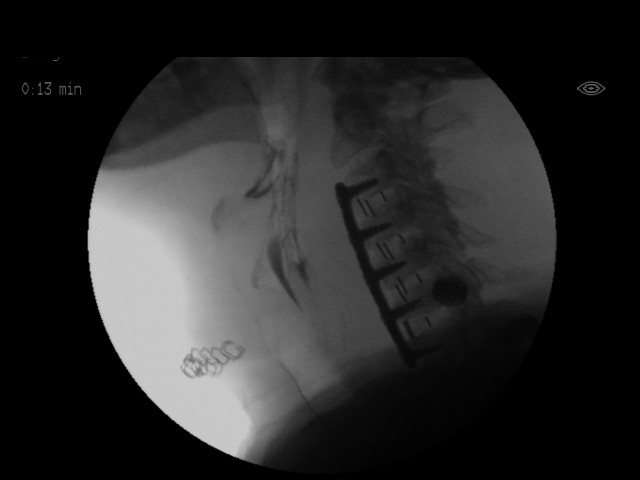
[im 4/10]
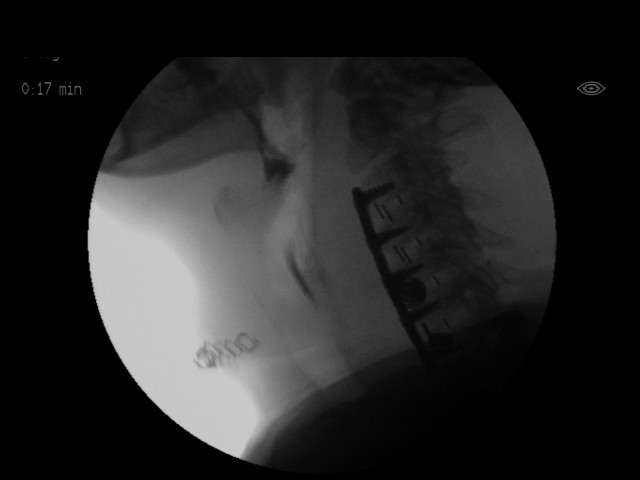
[im 4/10]
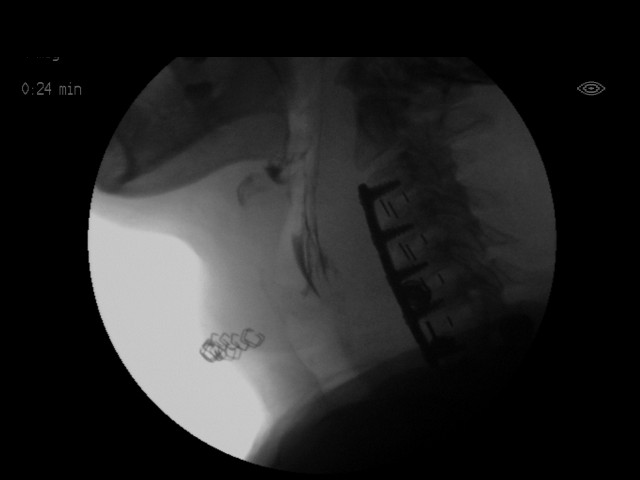
[im 5/10]
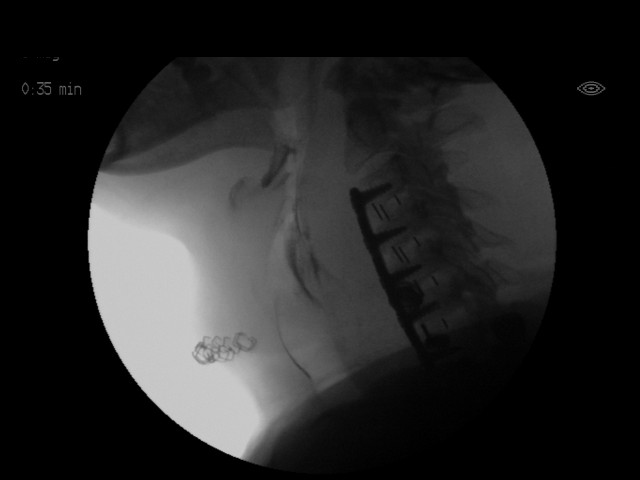
[im 5/10]
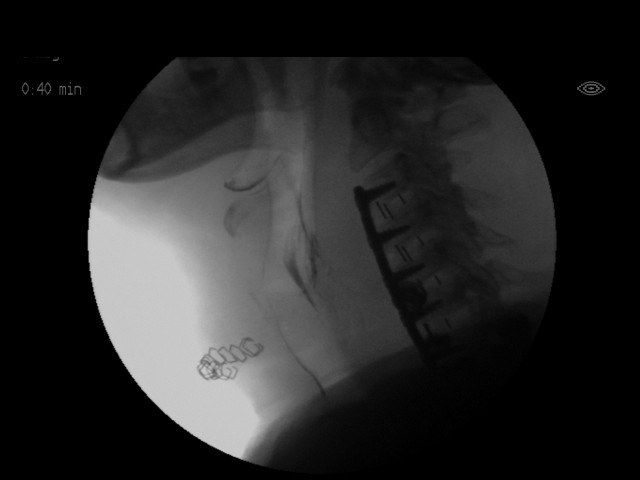
[im 6/10]
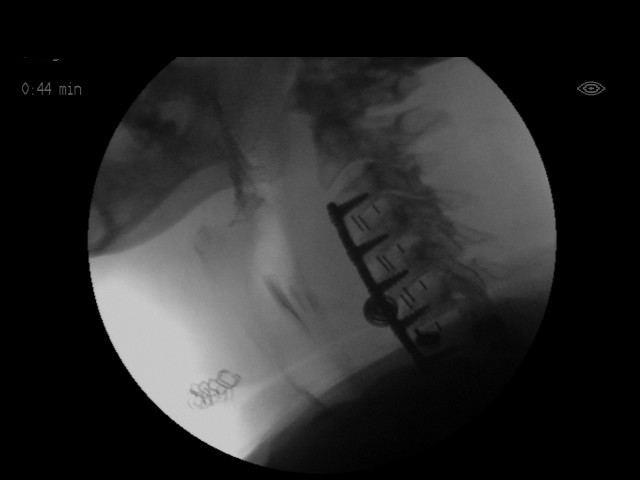
[im 7/10]
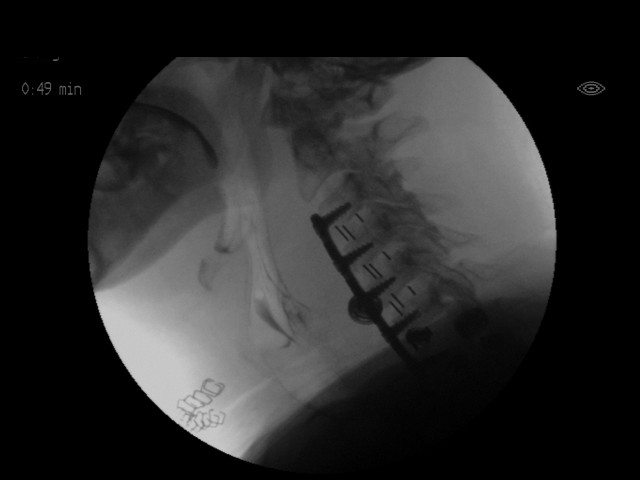
[im 7/10]
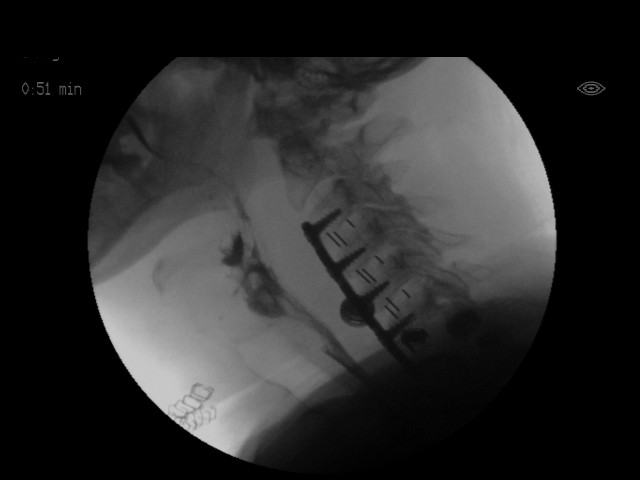
[im 7/10]
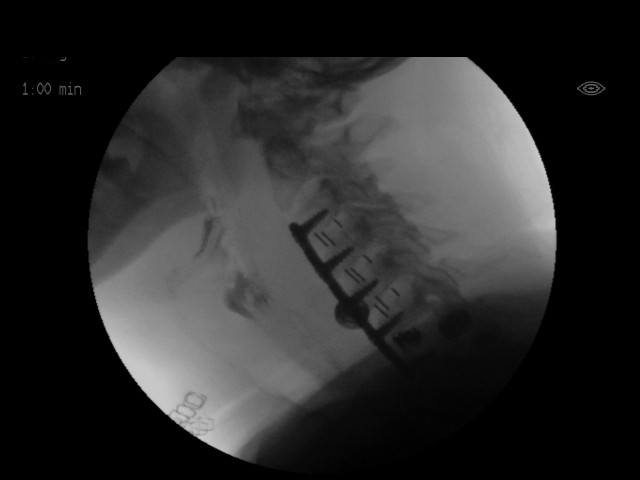
[im 8/10]
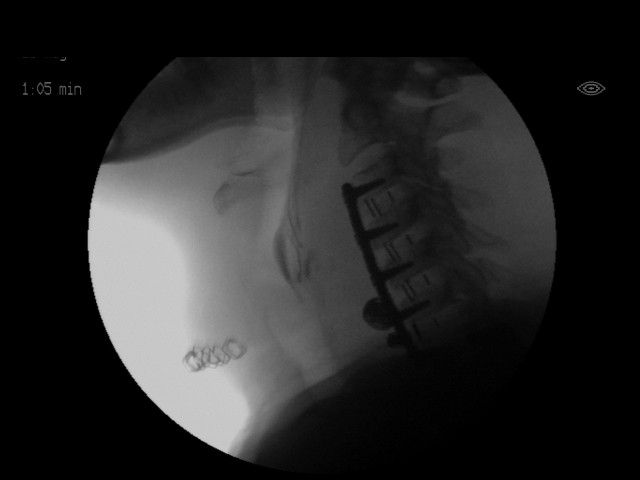
[im 9/10]
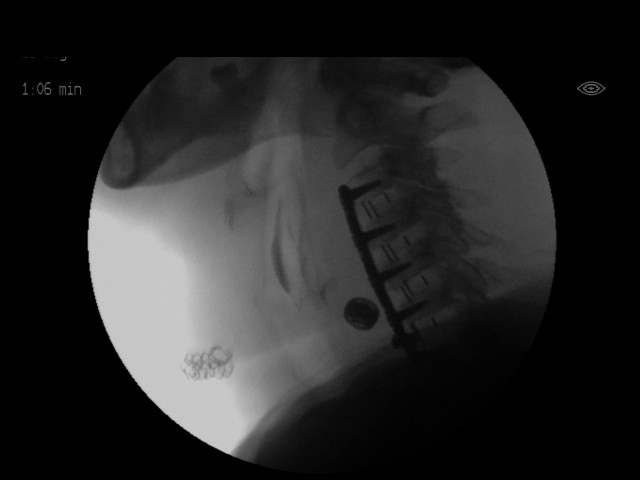
[im 9/10]
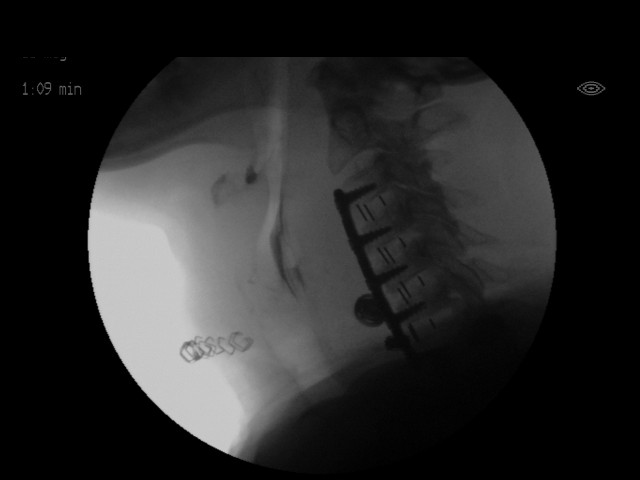
[im 10/10]
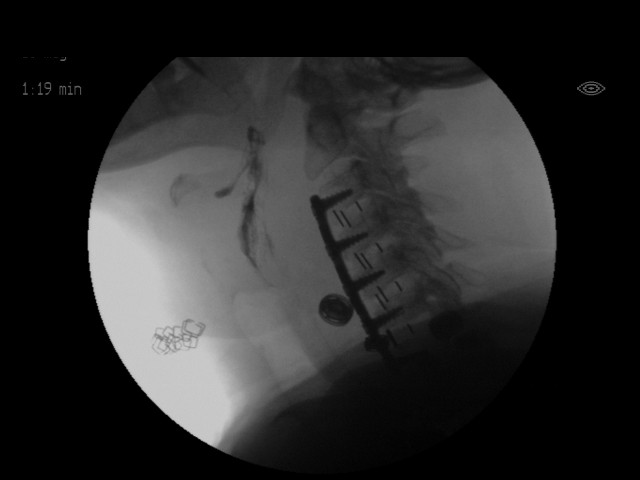
[im 10/10]
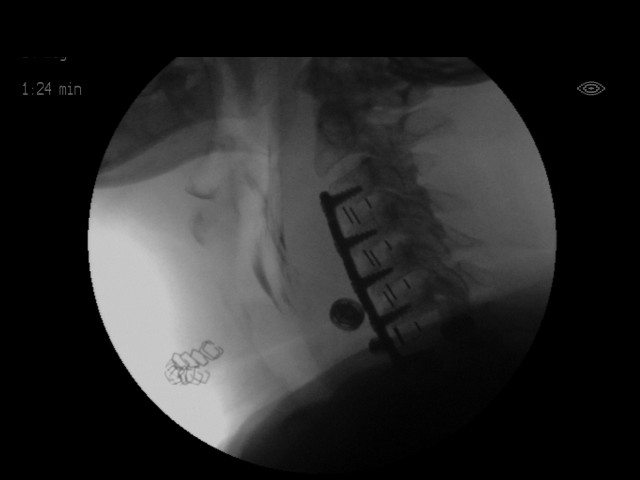

[18 of 24 positions shown; findings below may reference images not displayed]

FLUOROSCOPY FOR SWALLOWING FUNCTION STUDY:
Fluoroscopy was provided for swallowing function study, which was administered by a speech pathologist.  Final results and recommendations from this study are contained within the speech pathology report.

## 2017-03-07 IMAGING — RF DG SWALLOWING FUNCTION - [facility]PT [facility]
1 series · 18 of 24 positions shown · non-contrast
Comparison: none

[Series 1: run · 20 acquisitions, 18 frames shown]
[im 1/20]
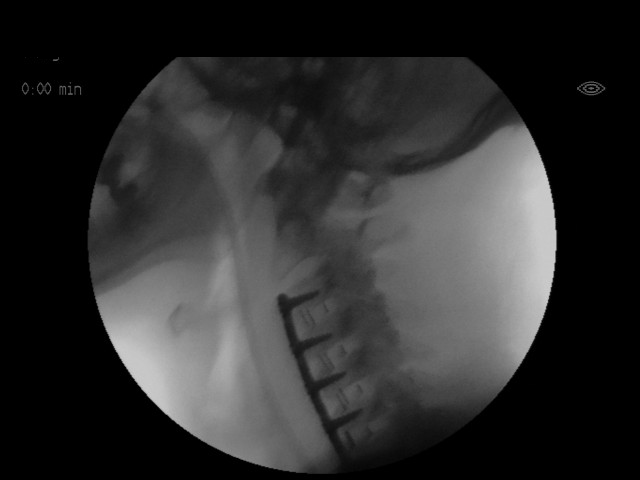
[im 2/20]
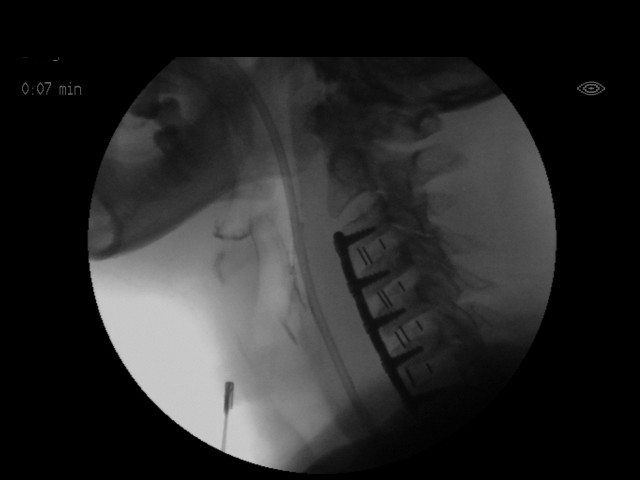
[im 3/20]
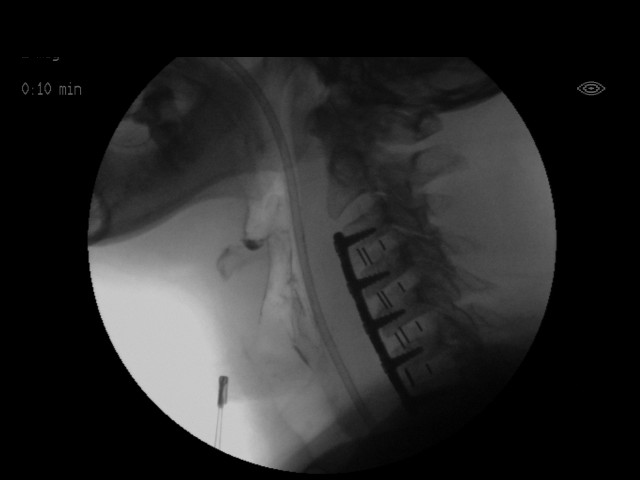
[im 4/20]
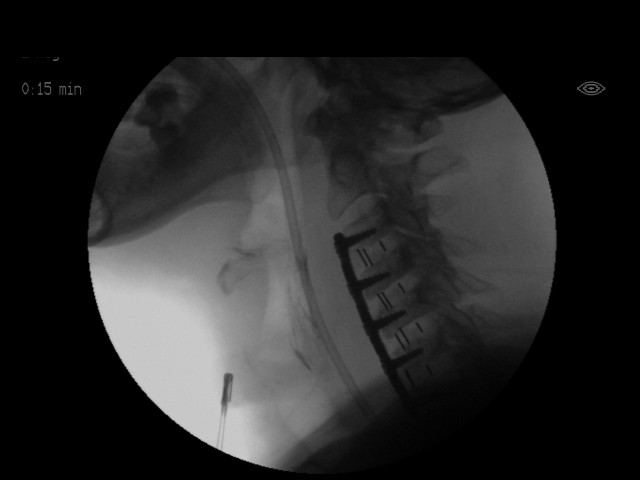
[im 6/20]
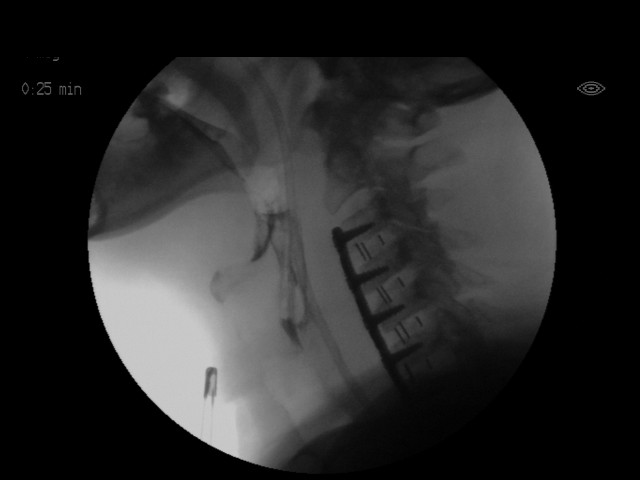
[im 7/20]
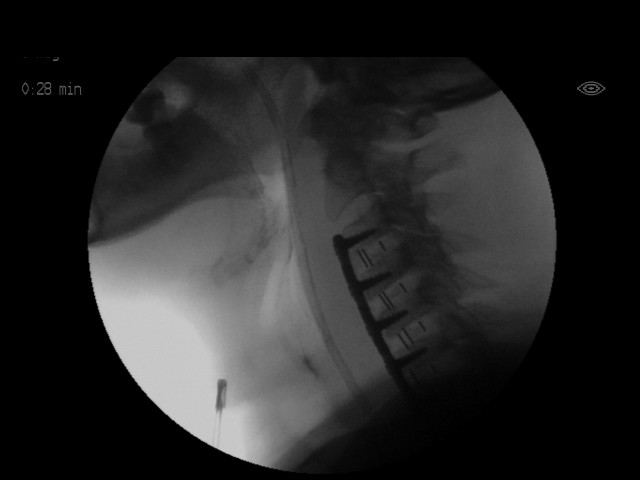
[im 7/20]
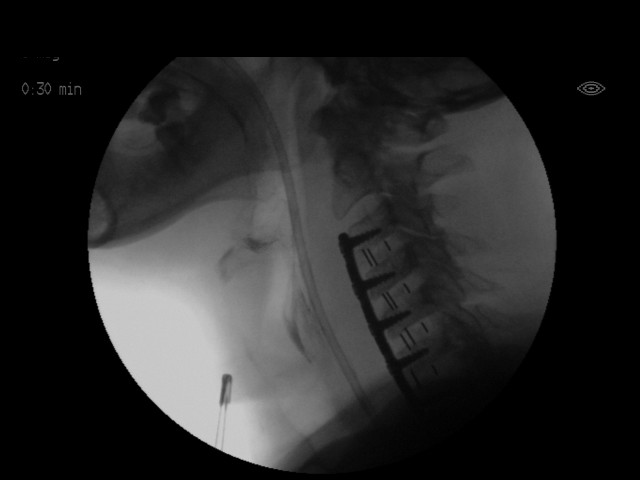
[im 9/20]
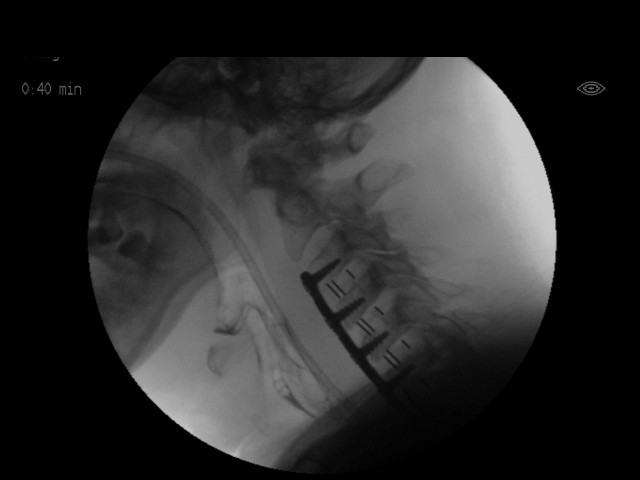
[im 10/20]
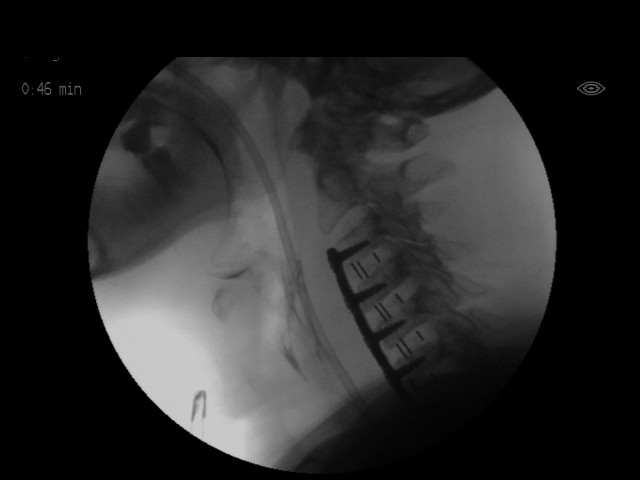
[im 11/20]
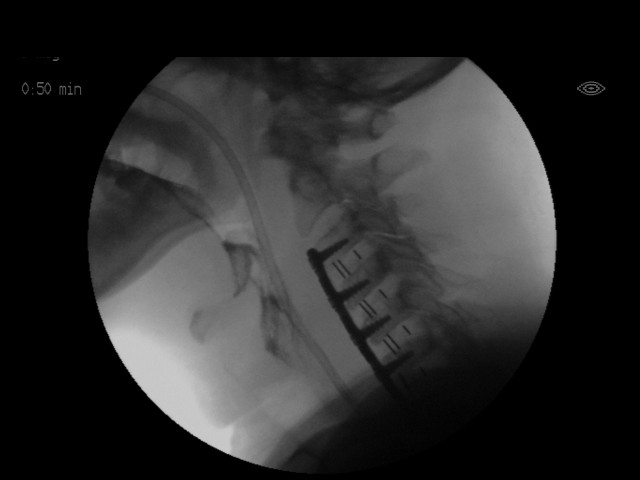
[im 13/20]
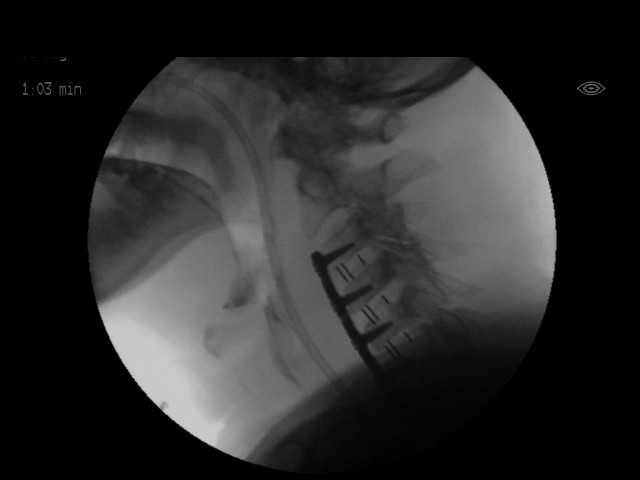
[im 14/20]
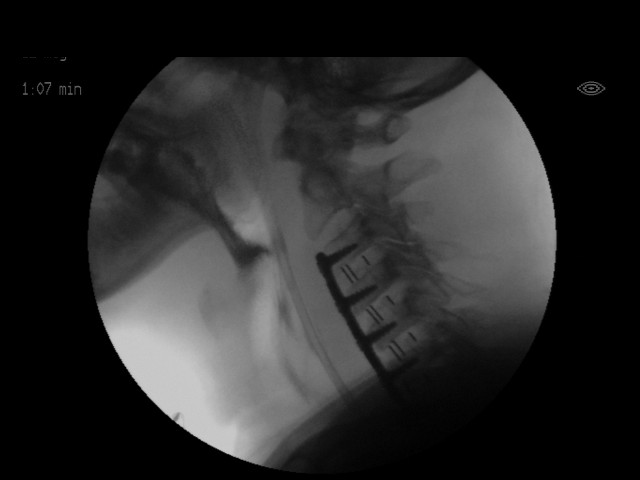
[im 14/20]
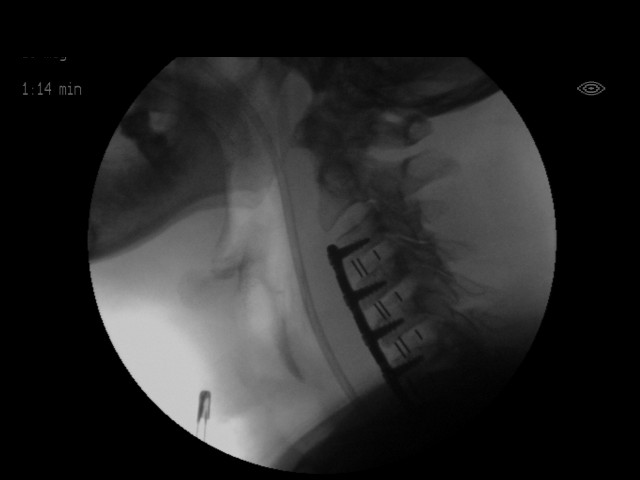
[im 16/20]
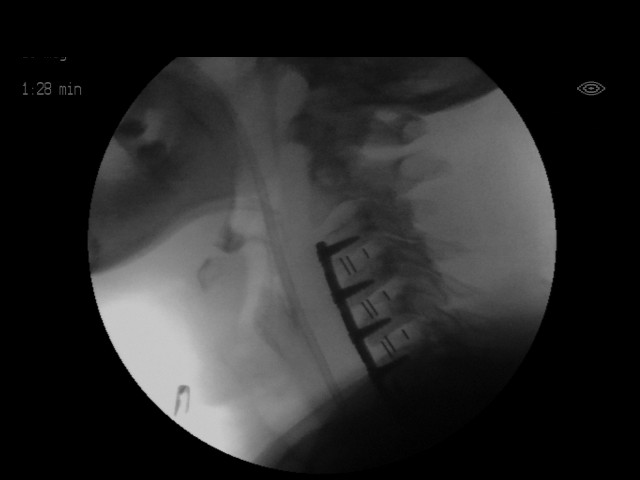
[im 17/20]
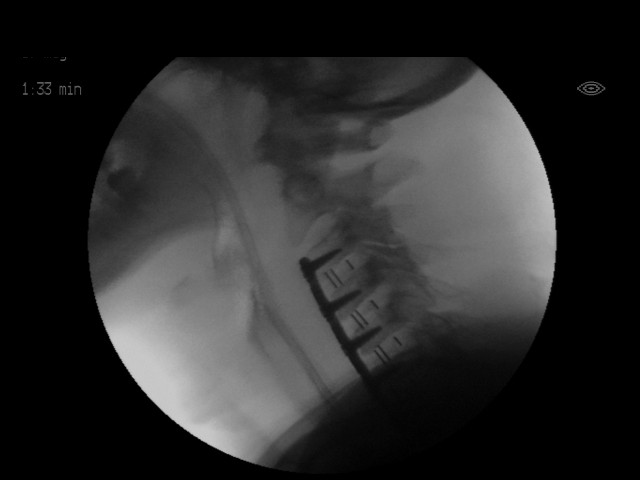
[im 18/20]
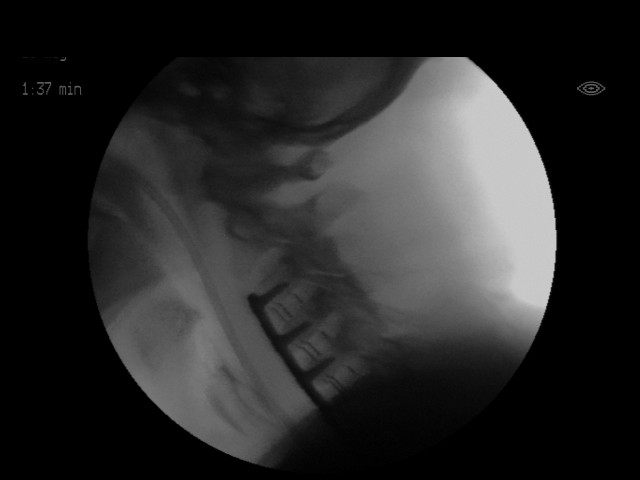
[im 20/20]
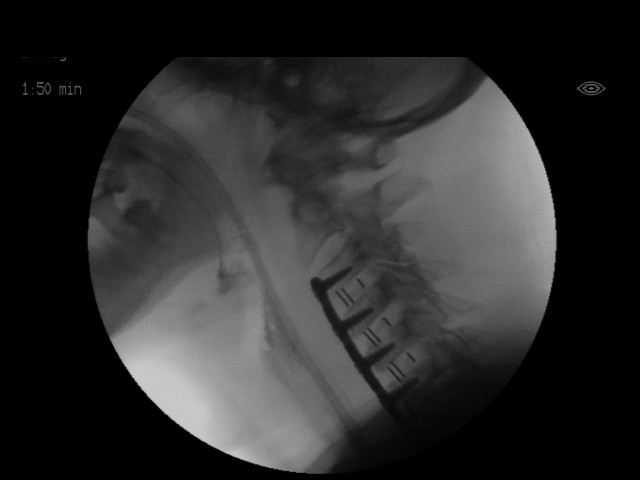
[im 20/20]
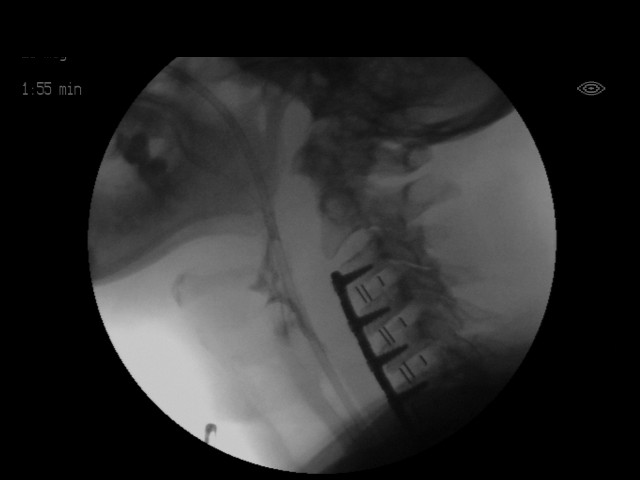

[18 of 24 positions shown; findings below may reference images not displayed]

FLUOROSCOPY FOR SWALLOWING FUNCTION STUDY:
Fluoroscopy was provided for swallowing function study, which was administered by a speech pathologist.  Final results and recommendations from this study are contained within the speech pathology report.
